# Patient Record
Sex: Female | Born: 1935 | Race: White | Hispanic: No | Smoking: Never smoker
Health system: Southern US, Community
[De-identification: ages and names within clinical notes are randomized; demographics above are authoritative.]

## PROBLEM LIST (undated history)

## (undated) DIAGNOSIS — F32A Depression, unspecified: Secondary | ICD-10-CM

## (undated) DIAGNOSIS — C50919 Malignant neoplasm of unspecified site of unspecified female breast: Secondary | ICD-10-CM

## (undated) DIAGNOSIS — R918 Other nonspecific abnormal finding of lung field: Secondary | ICD-10-CM

## (undated) DIAGNOSIS — M8580 Other specified disorders of bone density and structure, unspecified site: Secondary | ICD-10-CM

## (undated) DIAGNOSIS — Z860100 Personal history of colon polyps, unspecified: Secondary | ICD-10-CM

## (undated) DIAGNOSIS — M1990 Unspecified osteoarthritis, unspecified site: Secondary | ICD-10-CM

## (undated) DIAGNOSIS — K5790 Diverticulosis of intestine, part unspecified, without perforation or abscess without bleeding: Secondary | ICD-10-CM

## (undated) DIAGNOSIS — K219 Gastro-esophageal reflux disease without esophagitis: Secondary | ICD-10-CM

## (undated) DIAGNOSIS — K449 Diaphragmatic hernia without obstruction or gangrene: Secondary | ICD-10-CM

## (undated) DIAGNOSIS — I10 Essential (primary) hypertension: Secondary | ICD-10-CM

## (undated) DIAGNOSIS — J45909 Unspecified asthma, uncomplicated: Secondary | ICD-10-CM

## (undated) DIAGNOSIS — F329 Major depressive disorder, single episode, unspecified: Secondary | ICD-10-CM

## (undated) HISTORY — DX: Gastro-esophageal reflux disease without esophagitis: K21.9

## (undated) HISTORY — DX: Diaphragmatic hernia without obstruction or gangrene: K44.9

## (undated) HISTORY — DX: Personal history of colonic polyps: Z86.010

## (undated) HISTORY — DX: Unspecified asthma, uncomplicated: J45.909

## (undated) HISTORY — PX: ROTATOR CUFF REPAIR: SHX139

## (undated) HISTORY — DX: Other specified disorders of bone density and structure, unspecified site: M85.80

## (undated) HISTORY — DX: Unspecified osteoarthritis, unspecified site: M19.90

## (undated) HISTORY — DX: Depression, unspecified: F32.A

## (undated) HISTORY — DX: Other nonspecific abnormal finding of lung field: R91.8

## (undated) HISTORY — DX: Major depressive disorder, single episode, unspecified: F32.9

## (undated) HISTORY — DX: Diverticulosis of intestine, part unspecified, without perforation or abscess without bleeding: K57.90

## (undated) HISTORY — DX: Essential (primary) hypertension: I10

## (undated) HISTORY — DX: Personal history of colon polyps, unspecified: Z86.0100

## (undated) HISTORY — DX: Malignant neoplasm of unspecified site of unspecified female breast: C50.919

---
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
## 1972-04-25 HISTORY — PX: CHOLECYSTECTOMY: SHX55

## 1983-04-26 HISTORY — PX: SPINE SURGERY: SHX786

## 1998-03-06 ENCOUNTER — Other Ambulatory Visit: Admission: RE | Admit: 1998-03-06 | Discharge: 1998-03-06 | Payer: Self-pay | Admitting: Obstetrics and Gynecology

## 1999-06-14 ENCOUNTER — Other Ambulatory Visit: Admission: RE | Admit: 1999-06-14 | Discharge: 1999-06-14 | Payer: Self-pay | Admitting: Obstetrics and Gynecology

## 1999-11-16 ENCOUNTER — Encounter (INDEPENDENT_AMBULATORY_CARE_PROVIDER_SITE_OTHER): Payer: Self-pay | Admitting: *Deleted

## 1999-11-16 ENCOUNTER — Ambulatory Visit (HOSPITAL_COMMUNITY): Admission: RE | Admit: 1999-11-16 | Discharge: 1999-11-16 | Payer: Self-pay | Admitting: Gastroenterology

## 2000-04-21 ENCOUNTER — Other Ambulatory Visit: Admission: RE | Admit: 2000-04-21 | Discharge: 2000-04-21 | Payer: Self-pay | Admitting: Obstetrics and Gynecology

## 2000-05-08 ENCOUNTER — Encounter: Payer: Self-pay | Admitting: Obstetrics and Gynecology

## 2000-05-08 ENCOUNTER — Encounter: Admission: RE | Admit: 2000-05-08 | Discharge: 2000-05-08 | Payer: Self-pay | Admitting: Obstetrics and Gynecology

## 2001-06-18 ENCOUNTER — Other Ambulatory Visit: Admission: RE | Admit: 2001-06-18 | Discharge: 2001-06-18 | Payer: Self-pay | Admitting: Obstetrics and Gynecology

## 2002-06-25 ENCOUNTER — Other Ambulatory Visit: Admission: RE | Admit: 2002-06-25 | Discharge: 2002-06-25 | Payer: Self-pay | Admitting: Geriatric Medicine

## 2003-01-07 ENCOUNTER — Other Ambulatory Visit: Admission: RE | Admit: 2003-01-07 | Discharge: 2003-01-07 | Payer: Self-pay | Admitting: Gynecology

## 2003-04-14 ENCOUNTER — Observation Stay (HOSPITAL_COMMUNITY): Admission: RE | Admit: 2003-04-14 | Discharge: 2003-04-15 | Payer: Self-pay | Admitting: Orthopedic Surgery

## 2003-04-26 HISTORY — PX: OTHER SURGICAL HISTORY: SHX169

## 2004-07-02 ENCOUNTER — Other Ambulatory Visit: Admission: RE | Admit: 2004-07-02 | Discharge: 2004-07-02 | Payer: Self-pay | Admitting: Geriatric Medicine

## 2007-03-14 ENCOUNTER — Other Ambulatory Visit: Admission: RE | Admit: 2007-03-14 | Discharge: 2007-03-14 | Payer: Self-pay | Admitting: Geriatric Medicine

## 2007-07-10 ENCOUNTER — Encounter: Admission: RE | Admit: 2007-07-10 | Discharge: 2007-07-10 | Payer: Self-pay | Admitting: Orthopedic Surgery

## 2007-07-13 ENCOUNTER — Ambulatory Visit (HOSPITAL_BASED_OUTPATIENT_CLINIC_OR_DEPARTMENT_OTHER): Admission: RE | Admit: 2007-07-13 | Discharge: 2007-07-13 | Payer: Self-pay | Admitting: Orthopedic Surgery

## 2008-04-04 ENCOUNTER — Other Ambulatory Visit: Admission: RE | Admit: 2008-04-04 | Discharge: 2008-04-04 | Payer: Self-pay | Admitting: Geriatric Medicine

## 2008-10-31 ENCOUNTER — Encounter: Admission: RE | Admit: 2008-10-31 | Discharge: 2008-10-31 | Payer: Self-pay | Admitting: Geriatric Medicine

## 2008-11-07 ENCOUNTER — Encounter: Admission: RE | Admit: 2008-11-07 | Discharge: 2008-11-07 | Payer: Self-pay | Admitting: Geriatric Medicine

## 2009-04-14 ENCOUNTER — Encounter: Admission: RE | Admit: 2009-04-14 | Discharge: 2009-04-14 | Payer: Self-pay | Admitting: Geriatric Medicine

## 2009-04-25 HISTORY — PX: OTHER SURGICAL HISTORY: SHX169

## 2010-04-25 HISTORY — PX: JOINT REPLACEMENT: SHX530

## 2010-05-12 ENCOUNTER — Other Ambulatory Visit: Payer: Self-pay | Admitting: Geriatric Medicine

## 2010-05-12 ENCOUNTER — Other Ambulatory Visit
Admission: RE | Admit: 2010-05-12 | Discharge: 2010-05-12 | Payer: Self-pay | Source: Home / Self Care | Admitting: Geriatric Medicine

## 2010-05-12 ENCOUNTER — Encounter
Admission: RE | Admit: 2010-05-12 | Discharge: 2010-05-12 | Payer: Self-pay | Source: Home / Self Care | Attending: Geriatric Medicine | Admitting: Geriatric Medicine

## 2010-05-20 ENCOUNTER — Other Ambulatory Visit: Payer: Self-pay | Admitting: Obstetrics and Gynecology

## 2010-06-29 ENCOUNTER — Other Ambulatory Visit: Payer: Self-pay | Admitting: Orthopedic Surgery

## 2010-06-29 DIAGNOSIS — Z01818 Encounter for other preprocedural examination: Secondary | ICD-10-CM

## 2010-07-01 ENCOUNTER — Ambulatory Visit
Admission: RE | Admit: 2010-07-01 | Discharge: 2010-07-01 | Disposition: A | Discharge status: Home / Self Care | Payer: Medicare Other | Source: Ambulatory Visit | Attending: Orthopedic Surgery | Admitting: Orthopedic Surgery

## 2010-07-01 DIAGNOSIS — Z01818 Encounter for other preprocedural examination: Secondary | ICD-10-CM

## 2010-07-15 ENCOUNTER — Encounter (HOSPITAL_COMMUNITY)
Admission: RE | Admit: 2010-07-15 | Discharge: 2010-07-15 | Disposition: A | Discharge status: Home / Self Care | Payer: Medicare Other | Source: Ambulatory Visit | Attending: Orthopedic Surgery | Admitting: Orthopedic Surgery

## 2010-07-15 DIAGNOSIS — Z01818 Encounter for other preprocedural examination: Secondary | ICD-10-CM | POA: Insufficient documentation

## 2010-07-15 LAB — DIFFERENTIAL
Basophils Absolute: 0 10*3/uL (ref 0.0–0.1)
Basophils Relative: 0 % (ref 0–1)
Eosinophils Relative: 1 % (ref 0–5)
Monocytes Absolute: 0.9 10*3/uL (ref 0.1–1.0)

## 2010-07-15 LAB — CBC
HCT: 40.9 % (ref 36.0–46.0)
MCHC: 34 g/dL (ref 30.0–36.0)
RDW: 14.2 % (ref 11.5–15.5)

## 2010-07-15 LAB — BASIC METABOLIC PANEL
CO2: 27 mEq/L (ref 19–32)
Calcium: 9.2 mg/dL (ref 8.4–10.5)
Chloride: 103 mEq/L (ref 96–112)
Glucose, Bld: 95 mg/dL (ref 70–99)
Potassium: 4.6 mEq/L (ref 3.5–5.1)
Sodium: 138 mEq/L (ref 135–145)

## 2010-07-15 LAB — URINALYSIS, ROUTINE W REFLEX MICROSCOPIC
Hgb urine dipstick: NEGATIVE
Protein, ur: NEGATIVE mg/dL
Urobilinogen, UA: 0.2 mg/dL (ref 0.0–1.0)

## 2010-07-15 LAB — PROTIME-INR: Prothrombin Time: 13.1 seconds (ref 11.6–15.2)

## 2010-07-23 ENCOUNTER — Inpatient Hospital Stay (HOSPITAL_COMMUNITY): Payer: Medicare Other

## 2010-07-23 ENCOUNTER — Inpatient Hospital Stay (HOSPITAL_COMMUNITY)
Admission: RE | Admit: 2010-07-23 | Discharge: 2010-07-25 | DRG: 484 | Disposition: A | Discharge status: Home / Self Care | Payer: Medicare Other | Source: Ambulatory Visit | Attending: Orthopedic Surgery | Admitting: Orthopedic Surgery

## 2010-07-23 DIAGNOSIS — M67919 Unspecified disorder of synovium and tendon, unspecified shoulder: Secondary | ICD-10-CM | POA: Diagnosis present

## 2010-07-23 DIAGNOSIS — K219 Gastro-esophageal reflux disease without esophagitis: Secondary | ICD-10-CM | POA: Diagnosis present

## 2010-07-23 DIAGNOSIS — T84498A Other mechanical complication of other internal orthopedic devices, implants and grafts, initial encounter: Principal | ICD-10-CM | POA: Diagnosis present

## 2010-07-23 DIAGNOSIS — M719 Bursopathy, unspecified: Secondary | ICD-10-CM | POA: Diagnosis present

## 2010-07-23 DIAGNOSIS — Y831 Surgical operation with implant of artificial internal device as the cause of abnormal reaction of the patient, or of later complication, without mention of misadventure at the time of the procedure: Secondary | ICD-10-CM | POA: Diagnosis present

## 2010-07-23 LAB — TYPE AND SCREEN
ABO/RH(D): A NEG
Antibody Screen: NEGATIVE

## 2010-07-23 LAB — ABO/RH: ABO/RH(D): A NEG

## 2010-07-24 LAB — CBC
Platelets: 183 10*3/uL (ref 150–400)
RDW: 14.5 % (ref 11.5–15.5)
WBC: 7.2 10*3/uL (ref 4.0–10.5)

## 2010-07-24 LAB — BASIC METABOLIC PANEL
Calcium: 7.3 mg/dL — ABNORMAL LOW (ref 8.4–10.5)
GFR calc Af Amer: >60 mL/min (ref >60)
GFR calc non Af Amer: >60 mL/min (ref >60)
Potassium: 3.7 mEq/L (ref 3.5–5.1)
Sodium: 135 mEq/L (ref 135–145)

## 2010-07-25 LAB — HEMOGLOBIN AND HEMATOCRIT, BLOOD: Hemoglobin: 8.1 g/dL — ABNORMAL LOW (ref 12.0–15.0)

## 2010-07-28 NOTE — Op Note (Signed)
NAME:  Tammy Lindsey, Tammy Lindsey NO.:  [id number]  MEDICAL RECORD NO.:  [id number]           PATIENT TYPE:  O  LOCATION:  5031                         FACILITY:  [facility]  PHYSICIAN:  Almedia Balls. Ranell Patrick, M.D. DATE OF BIRTH:  1932/04/06  DATE OF PROCEDURE:  07/23/2010 DATE OF DISCHARGE:                              OPERATIVE REPORT   PREOPERATIVE DIAGNOSES:  Left shoulder painful hemiarthroplasty and also rotator cuff deficiency.  POSTOPERATIVE DIAGNOSES:  Left shoulder painful hemiarthroplasty and also rotator cuff deficiency.  PROCEDURE PERFORMED:  Left shoulder revision, total shoulder arthroplasty to reverse total shoulder.  Removal of humeral stem.  ATTENDING SURGEON:  Almedia Balls. Ranell Patrick, MD  ASSISTANT:  Donnie Coffin. Dixon, PA-C  ANESTHESIA:  General anesthesia was used plus interscalene block.  ESTIMATED BLOOD LOSS:  400 mL.  FLUID REPLACEMENT:  1000 mL of crystalloid and 1000 mL Xtend.  INSTRUMENT COUNTS:  Correct. COMPLICATIONS:  No complications.  Perioperative antibiotics were given.  INDICATIONS:  The patient is a 77 year old female who is status post left shoulder hemiarthroplasty for rotator cuff deficiency.  This was done about 8 years ago.  The patient had a CTA head prosthesis placed. She has had decent function, but significant pain following her surgery. Pain has progressed over the years.  She has been managed nonsurgically for as long as she can tolerate.  At this point, she is desiring conversion of her hemiarthroplasty to a reverse total shoulder arthroplasty to create fixed fulcrum mechanics for her shoulder potentially giving a better function, but primarily for pain relief.  We discussed thoroughly all options for treatment.  She wants to proceed with surgery.  Risks and benefits were discussed.  Risks including, but not limited to infection and dislocation of her shoulder arthroplasty. Informed consent was obtained.  DESCRIPTION OF  OPERATION:  After an adequate level of anesthesia was achieved, the patient was positioned in a modified beach-chair position. Left shoulder was sterilely prepped and draped in the usual manner.  All neurovascular structures have been padded appropriately.  Pulse is checked in her feet.  Following sterile prep and drape, we entered the shoulder through a deltopectoral incision.  Dissection down through subcutaneous tissue using a knife and Bovie was used to control hemostasis.  The pectoralis insertion was kept intact on the humerus, deltoid was extremely gently elevated off the underlying soft tissues and humerus and off the humeral implant.  The subscapularis was taken off in a full-thickness fashion off the humerus, the humeral neck, soft tissue release off the humeral neck as well.  We were able to gently deliver the humerus out of the wound.  This took [place] about 30-40 minutes to get this done, again being very careful to preserve all of her deltoid as she has had prior rotator cuff surgery 20 years ago with significant deltoid atrophy after that rotator cuff surgery.  We felt like her anterior head of the deltoid was probably her best muscle as we are going in and that was preserved in its entirety coming off the anterior [facility] joint and off the acromion.  The patient's humeral head was  then removed.  We then identified the proximal humeral stem there and removed all soft tissue from around that granulation tissue.  We then used flexible osteotomes around the proximal portion and tried to disimpact the stem out of the canal and really was not able to do that initially, but then we had to take a flexible osteotome and basically go the entire length of the stem in all 4 sides with the broach handle and heavy duty mouth was able to remove this stem out of the humerus. Fortunately, we only lost a little bone proximally 1.5 cm to 2 cm that just broke away anteriorly and posteriorly.  The  remaining humerus was one nice tube, we checked that with a curette just to make sure I could feel intact humerus in all sides.  At this point, we went ahead and placed a smaller stem in the canal to protect the humerus.  We then retracted the humerus posteriorly.  We did removal of all scar tissue from around the glenoid.  Interestingly the patient had migrated proximally and medially and developed a pseudo glenoid that was in the upper half of the glenoid up under the coracoid process.  We found some native glenoid down below at the inferior pillar and basically cleaned the soft tissue off that.  We were careful to palpate and protect the axillary nerve during this portion of surgery and once we did that we were able to scrape remaining cartilage and soft tissue off the bone and then place our central guide pin for the metaglene reaming in appropriate position.  We then reamed for the metaglene down to subchondral bone and at this point felt like we had enough support, again the upper portion was eroded away in hirsute of glenoid that she had made both proximally and superiorly, but we had at least a 210 to 220 degrees arc of good reamed bone for support for the metaglene.  We then drilled up the central hole for the metaglene.  We then impacted the metaglene in position with that inferior hole lining up with the inferior pillar of the scapula.  We then placed our 48 inferior screw with locking screw 24 anterior locking and a 18 posterior nonlocked.  We went ahead and tightened those down to secure the metaglene into position and then locked it in place via anterior and inferior screw before we placed our superior screw as the superior portions of the metaglene was not supported.  Once we had that screw in position and tightened, we went ahead and locked the locking mechanism as that would serve as basically appear.  We were in good bone with a 30-mm locking screw up under the coracoid  process so that should help [place] preventing any rock superiorly.  With that in place, we then selected a 38 standard glenosphere and then placed that screw into position.  We then directed our attention towards the humeral side.  I had to drill out the inferior pedestal in the humeral shaft.  We had basically centering sleeve that we could slide down the humerus and then drilled with a 4.4 long drill bit out the bottom of the pedestal and then sequentially reamed up to a size 10.  She had 12 global advantage stem, but we felt like we were going to pushing it distally with anything bigger than a 10, so we were able to ream up to a 10 and then we trialed with the long stem 10, size 1 humerus, 0  degrees of retroversion.  With that in place, we reduced the shoulder with a +6 poly and we were very happy with soft tissue balancing.  We had nice tension on the conjoined and on the pec.  The nervous tissue seemed to be under some tension, but not excessive.  We then dislocated that and then thoroughly pulse irrigated the humerus. We were not able to use a cement restrictor as that cement restrictor was basically right at where the inferior part of the stem had to be so we just went ahead and selected high viscosity DePuy cement with a small nozzle for the cement gun and then mixed cement vacuum mixing on the back table and then at about 4 minutes injected that cement after we dried the canal thoroughly, injected that into the canal, and once we had that filled we went ahead and placed our stem, a long stem cemented size 10 reverse stem with 0 degrees eversion.  With that in position, we allowed the cement to harden, we then trialed again with +6.  We were happy with that and then selected real +6 poly, 38 +6, reduced that and again we were happy with stability through a full arc of motion, negative sulcus, no gapping with external rotation and no bony or soft tissue impingement either.  We checked  the nerve one more time to make sure it was in good shape. We then thoroughly irrigated with a liter of irrigation and closed the deltopectoral interval with 0-Vicryl suture followed by 2-0 Vicryl subcutaneous closure, 4-0 for Monocryl for skin.  Steri-Strips were applied followed by sterile dressing.  The patient tolerated the surgery well.     Almedia Balls. Ranell Patrick, M.D.     SRN/MEDQ  D:  07/23/2010  T:  07/24/2010  Job:  [id number]  Electronically Signed by Malon Kindle  on 07/28/2010 08:08:31 PM

## 2010-08-18 NOTE — H&P (Signed)
  NAME:  Tammy Lindsey, Tammy Lindsey NO.:  [id number]  MEDICAL RECORD NO.:  [id number]          PATIENT TYPE:  LOCATION:                                 FACILITY:  PHYSICIAN:  Almedia Balls. Ranell Patrick, M.D. DATE OF BIRTH:  11-14-1931  DATE OF ADMISSION: DATE OF DISCHARGE:                             HISTORY & PHYSICAL   CHIEF COMPLAINT:  Left shoulder pain.  HISTORY OF PRESENT ILLNESS:  The patient is a 77 year old female with worsening left shoulder pain status post a CTA head, arthroplasty for osteoarthritis.  The patient elected to have surgery to decrease pain and increase function of the left shoulder.  PAST MEDICAL HISTORY:  Negative.  FAMILY MEDICAL HISTORY:  Negative.  SOCIAL HISTORY:  The patient of Dr. Merlene Laughter, does not smoke or use alcohol.  DRUG ALLERGIES:  SULFA.  CURRENT MEDICATIONS:  Zegerid, Lexapro, Provera, Advil, aspirin, and calcium.  REVIEW OF SYSTEMS:  Pain on range of motion of the left shoulder.  PHYSICAL EXAMINATION:  VITAL SIGNS:  Pulse 74, respirations 16, blood pressure 102/68. GENERAL:  The patient is a healthy-appearing 77 year old female, in no acute distress.  Pleasant mood and affect, alert and oriented  x3. HEAD AND NECK:  Full range of motion without any difficulty.  She has no tenderness to palpation of the cervical and thoracic spine. CHEST:  Active breath sounds bilaterally.  No wheezes, rhonchi, or rales. HEART:  Regular rate and rhythm.  No murmur. ABDOMEN:  Nontender, nondistended with active bowel sounds. EXTREMITIES:  Moderate tenderness of the left shoulder with adequate strength is 4/5, but she has full flexion actively to about 70 degrees external rotation, neutral internal rotation.  Neurovascularly otherwise she is intact.  X-rays show a left shoulder previous hemiarthroplasty with CTA head.  IMPRESSION:  Left shoulder hemiarthroplasty with CTA head.  PLAN OF ACTION:  A reverse total shoulder arthroplasty  revision by Dr. Malon Kindle.     Thomas B. Dixon, P.A.   ______________________________ Almedia Balls. Ranell Patrick, M.D.    TBD/MEDQ  D:  07/21/2010  T:  07/22/2010  Job:  [id number]  Electronically Signed by Standley Dakins P.A. on 07/29/2010 09:13:51 AM Electronically Signed by Malon Kindle  on 08/18/2010 05:15:39 PM

## 2010-08-18 NOTE — Discharge Summary (Signed)
  NAME:  Tammy Lindsey, Tammy Lindsey NO.:  [id number]  MEDICAL RECORD NO.:  [id number]           PATIENT TYPE:  O  LOCATION:  5031                         FACILITY:  [facility]  PHYSICIAN:  Almedia Balls. Ranell Patrick, M.D. DATE OF BIRTH:  13-Jan-1932  DATE OF ADMISSION:  07/23/2010 DATE OF DISCHARGE:  07/25/2010                              DISCHARGE SUMMARY   ADMISSION DIAGNOSIS:  Painful left shoulder with previous hemiarthroplasty.  DISCHARGE DIAGNOSIS:  Painful left shoulder with previous hemiarthroplasty status post reverse total shoulder arthroplasty.  BRIEF HISTORY:  The patient is a 77 year old female with worsening left shoulder pain and function status post hemiarthroplasty with a CTA head. The patient has elected for surgical management to decrease pain and increase function of that left shoulder.  PROCEDURE:  The patient had a left total shoulder arthroplasty revision using a reverse total shoulder arthroplasty on July 23, 2010. Assistant was [employer], PA-C.  General anesthesia was used.  No complications.  HOSPITAL COURSE:  The patient admitted on July 23, 2010, for the above- stated procedure which she tolerated well.  After adequate time in postanesthesia care unit, she was transferred up to 5000.  Postop day 1, the patient complained about only minimal pain, was able to work with physical therapy and occupational therapy quite well and thus the patient will be discharged home with home health exercises.  Overall, she is doing quite well, and she was afebrile and her labs within acceptable limits status post surgery.  DISCHARGE PLAN:  The patient was discharged home on July 24, 2010.  Her condition is stable.  Her diet is regular.  DISCHARGE MEDICATIONS:  Robaxin 500 mg p.o. q.6 h. and Percocet 5/325 one to two tablets q.4-6 h. p.r.n. for pain.     Thomas B. Dixon, P.A.   ______________________________ Almedia Balls. Ranell Patrick, M.D.    TBD/MEDQ  D:   07/27/2010  T:  07/28/2010  Job:  [id number]  Electronically Signed by Standley Dakins P.A. on 07/29/2010 09:13:47 AM Electronically Signed by Malon Kindle  on 08/18/2010 05:15:36 PM

## 2010-08-24 HISTORY — PX: JOINT REPLACEMENT: SHX530

## 2010-09-07 NOTE — Op Note (Signed)
NAME:  LOVEDA, Tammy Lindsey NO.:  [id number]   MEDICAL RECORD NO.:  [id number]          PATIENT TYPE:  AMB   LOCATION:  DSC                          FACILITY:  [facility]   PHYSICIAN:  Dionne Ano. 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   POSTOPERATIVE DIAGNOSIS:  Right thumb carpometacarpal joint degenerative  joint disease, end-stage, with failure of conservative management.   PROCEDURE:  1. Right thumb arthroplasty (removal of the trapezium at the basilar      joint/wrist level.  This was a complete trapezium removal).  2. Right abductor pollicis longus digastric portion tendon transfer to      the first metacarpal and FCR and back upon the tendon itself      (Zancolli tendon transfer) right basilar thumb joint.  3. Right abductor pollicis longus one-third proper tendon transfer to      the FCR and APL proper (Weilby tendon transfer) right basilar thumb      joint.  4. Right abductor pollicis longus tenodesis (shortening of wrist      extensor at the wrist forearm level to prevent dorsolateral      escape).   SURGEON:  Dionne Ano. Amanda Pea, M.D.   ASSISTANT:  Karie Chimera, P.A.-C.   COMPLICATIONS:  None.   ANESTHESIA:  General.   TOURNIQUET TIME:  Less than an hour.   INDICATIONS FOR PROCEDURE:  This patient is a 77 year old female who  presents with the above-mentioned diagnosis.  I have counseled her in  regards to the risks and benefits of surgery including risk of  infection, bleeding anesthesia, damage to normal structures, and failure  of surgery to accomplish its intended goals of relieving symptoms and  restoring function.  With this in mind, she desires to proceed.  All  questions have been encouraged and  answered preoperatively.   OPERATIVE PROCEDURE:  The patient was seen by myself and anesthesia.  Permit was signed.  She was counseled extensively with her husband  present.  Preoperative antibiotics were given.  Time-out was called, arm  was marked, and she was taken to the operative suite and sedated.  Previously, a regional block anesthetic had been introduced and provided  excellent anesthesia.  Once in the operative suite, a sterile field was  secured.  The arm was then elevated.  Tourniquet was insufflated to 250  mmHg, and a curvilinear incision was made dorsally.  Following this,  dissection was carried down, and the EPB and APL interval was entered.  Superficial radial nerve branch was identified and protected at all  times.  Radial artery was identified and protected at all times.  I then  incised the capsule, performed trapezium excision in a piecemeal-type  fashion, and then performed FCR tenolysis tenosynovectomy.  Following  this, I irrigated and created a drill hole dorsal to palmar exiting  intra-articularly in line with the palmar beak ligament.  This was  enlarged to a 2/8 drill bit size approximately.   I should note that this completed the trapezium removal and arthroplasty  portion of the procedure.  The patient tolerated this well.  Following  this, I then turned attention towards a counterincision made dorsally.  A one-third proper portion of the APL tendon was harvested, and the APL  digastric portion was clipped proximally and retracted distally.  Once  this done, the APL digastric portion was taken around the APL base and  placed through a drill hole dorsally to palmar, exiting around the FCR  tendon twice and then back up through itself, the capsule, and the APL  proper tendon.  It was inset with 3-0 FiberWire and completed the  Zancolli tendon transfer.   Following this, I then took the free one-third proper APL tendon graft  and weaved it through the FCR  and APL proper in multiple figure-of-eight  throws creating a Weilby tendon transfer.  This was inset with 3-0  FiberWire as well.  This I made for a nice a good fit and joint.  Following this, irrigation was applied, and Gelfoam was placed in the  void.  The patient had a good splay.   Following this, I then performed a tightening and shortening of the APL  tendon.  This was shortening to prevent dorsolateral escape of the first  metacarpal.  This was done with 3-0 FiberWire and with APL tenodesis.  Next, a complex capsular closure was performed.  This was done with  FiberWire as well.  At this time, I then identified the area.  I noted  it looked quite well.  There were no complicating features, and the  thumb sat nicely in a nicely suspended and stabilized position.  We  deflated the tourniquet, obtained hemostasis with bipolar cautery as we  did during the initial dissection, and closed the wound with Prolene.  Following this, the patient was placed in a very careful thumb spica  splint preventing MCP hyperextension or excessive flexion of the thumb  CMC region.   The patient tolerated this well.  She will be monitored overnight and  discharged in the morning.  We will plan for IV antibiotics, pain  management according to her needs and protocol, and see her back in the  office in 10 days for our standard postoperative Zancolli protocol with  removal of her splint.  X-rays, casting for four and a half weeks, at  the six-week postoperative mark move her to the therapeutic area after  removal of splinting and the remaining therapeutic endeavors.   It has been a pleasure to see today and treat her.  All questions have  been encouraged and answered.           ______________________________  Dionne Ano. Everlene Other, M.D.     Nash Mantis  D:  07/13/2007  T:  07/13/2007  Job:  [id number]

## 2010-09-10 NOTE — Op Note (Signed)
NAME:  Tammy Lindsey, Tammy Lindsey                           ACCOUNT NO.:  [id number]   MEDICAL RECORD NO.:  [id number]                   PATIENT TYPE:  OBV   LOCATION:  0446                                 FACILITY:  [facility]   PHYSICIAN:  Almedia Balls. Ranell Patrick, M.D.              DATE OF BIRTH:  02-Oct-1931   DATE OF PROCEDURE:  04/14/2003  DATE OF DISCHARGE:  04/15/2003                                 OPERATIVE REPORT   PREOPERATIVE DIAGNOSIS:  Left shoulder rotator cuff tear arthropathy.   POSTOPERATIVE DIAGNOSIS:  Left shoulder rotator cuff tear arthropathy.   PROCEDURE PERFORMED:  Left shoulder hemiarthroplasty using [employer] CTA  prosthesis.   SURGEON:  Almedia Balls. Ranell Patrick, M.D.   FIRST ASSISTANT:  Clarene Reamer, P.A.-C.   SECOND ASSISTANT:  Nance Pew, P.A.-S.   General anesthesia plus interscalene block anesthesia was used.   ESTIMATED BLOOD LOSS:  Minimal.   FLUIDS REPLACED:  1800 mL crystalloid.   Instrument count was correct.  There were no complications.  Perioperative  antibiotics were given.   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  The patient has had  worsening shoulder pain.  She has had multiple operations for rotator cuff  repairs resulting in a loss of rotator cuff function and progressive  arthrosis in the shoulder.  The patient has failed conservative management  consisting of multiple intra-articular steroid injections, activity  modification, and pain medication.  Now presents with debilitating pain and  poor shoulder function, for left shoulder surgery.   DESCRIPTION OF PROCEDURE:  After an adequate level of anesthesia was  achieved, the patient was positioned in the modified beach chair position.  All neurovascular structures were padded appropriately.  Preoperative range  of motion was noted to be forward flexion 60 degrees, external rotation less  than 20 degrees, internal  rotation at about 40 degrees, abduction limited to  about 30 degrees with a solid end point.  After completion of exam under  anesthesia, the left shoulder and arm were prepped and draped in their  entirety in the usual sterile fashion.  A deltopectoral incision was created  utilizing the coracoid process as a landmark and extending down toward the  leading edge of the deltoid on the humeral side.  Dissection was carried  sharply down through subcutaneous tissues using Bovie electrocautery.  The  cephalic vein was identified and taken laterally with the deltoid, the  pectoralis was taken medially, and the upper centimeter was released  sharply, protecting deep structures.  This revealed the clavipectoral  fascia, which was sharply incised.  Next the conjoined tendon was taken  medially, revealing the three sisters.  These were ligated under direct  visualization.  Next the subscapularis was taken off the humerus with  attention toward taking this off subperiosteally to preserve as much  subscapular length as possible.  At this  point the shoulder was exposed.  There was noted to be end-stage arthritis on the end of the humerus with  osteophyte formation.  Next the humeral cutting guide was applied to the  humerus.  A curved retractor was placed around the humeral head to protect  posterior soft tissues, and this also provided a cutting guide for the saw.  An oscillating saw was used to remove the arthritic humeral head with  appropriate anteversion and an appropriate neck angle.  At this point the  humeral canal was prepared utilizing sequential broaches, and we were able  to broach up to a size 12.  We prepared initially with hand-held reamers and  then went to broaches.  Following this we were careful in selecting the  anteversion, set at around 35-40 degrees with the anterior fin along the  bicipital groove.  This provided appropriate head coverage.  We then  selected a 44 size head x 18  mm thick.  This trialled with nice soft tissue  tension.  Again there was noted to be no remaining rotator cuff; thus, the  CTHA was placed on and used to remove the greater tuberosity.  A rongeur was  used to remove excess bone to allow the CTA head process to be fully seated.  Once this was on the shoulder was taken again through a full range of  motion.  Excellent coverage and balance was noted.  At this point trial  components were removed.  The real implants were impacted with nice press  fit into place, care taken toward keeping the stem out of varus and also  toward appropriate version.  Again the shoulder was trialled.  There was  noted to be excellent coverage with the 44 x 18 prosthesis.  Again, a CTA  prosthesis was used.  The glenoid was assessed prior to applying the final  prosthesis and was felt to be adequately concave in its appearance with no  need for preparation using the glenoid reamers.  At this point after  reducing the implant, subscapularis was reapplied to the humerus utilizing  drill holes and Mason-Allen sutures into the subscapularis with 1 mm cottony  Dacron suture tapes that were brought out through the bicipital groove.  The  biceps was tenodesed into the groove with a soft tissue-type tenodesis,  sewing it directly to bone and surrounding soft tissues.  This provided a  solid subscapularis repair.  We able to externally rotate to 40 degrees with  the subscapularis repaired without any undue tension.  At this point the  deltopectoral wound was closed after thorough irrigation.  A drain was  placed prior to closure, and the skin was closed using running 4-0 Monocryl.  A sterile dressing was applied, followed by shoulder splinting.  The patient  tolerated surgery well.                                               Almedia Balls. Ranell Patrick, M.D.    SRN/MEDQ  D:  04/27/2003  T:  04/28/2003  Job:  [id number]

## 2010-09-10 NOTE — Procedures (Signed)
[facility]  Patient:    Tammy Lindsey, Tammy Lindsey                           MRN: [id number] Proc. Date: 11/16/99 Adm. Date:  [id number] Disc. Date: [id number] Attending:  Rich Brave CC:         Georg Ruddle. Viviann Spare, M.D.                           Procedure Report  PROCEDURE:  Colonoscopy with biopsy.  INDICATIONS:  A 77 year old for colon cancer screening.  FINDINGS:  Diminutive distal rectal polyp.  Scattered diverticulosis.  DESCRIPTION OF PROCEDURE:  The risks of the procedure had been discussed with the patient, as well as the rationale behind doing it, and she provided written consent.  Sedation with Fentanyl 50 mcg and Versed 6 mg intravenously without arrhythmias or desaturations.  The Olympus pediatric adjustable tension video colonoscope was easily advanced to the cecum, as identified by clear visualization of the appendiceal orifice, after which pullback was performed.  The quality of the prep was very good, and it was felt that all areas were well seen.  There was a diminutive 2 to 3 mm sessile polyp removed by cold biopsy technique in the distal rectum.  This was performed after an otherwise normal retroflexion. There was scattered diverticulosis, especially in the left colon, but also a little bit proximally as well.  There was no evidence of large polyps, cancer, colitis, or vascular malformations. The patient tolerated the procedure well, and there were no apparent complications.  IMPRESSION: 1. Distal rectal polyp, pathology pending. 2. Diverticulosis.  PLAN: 1. Await pathology and biopsies. 2. The patients bowel habits have improved, such that her stools are not as    loose as they used to be, although there is still a slightly loose    character to them.  If this should persist, we could give consideration to    a trial of Asacol. DD:  11/16/99 TD:  11/17/99 Job: [id number]

## 2010-12-29 ENCOUNTER — Other Ambulatory Visit: Payer: Self-pay | Admitting: Ophthalmology

## 2011-01-17 LAB — POCT HEMOGLOBIN-HEMACUE: Hemoglobin: 12.8

## 2011-05-16 DIAGNOSIS — E7800 Pure hypercholesterolemia, unspecified: Secondary | ICD-10-CM | POA: Diagnosis not present

## 2011-05-16 DIAGNOSIS — Z79899 Other long term (current) drug therapy: Secondary | ICD-10-CM | POA: Diagnosis not present

## 2011-05-16 DIAGNOSIS — M949 Disorder of cartilage, unspecified: Secondary | ICD-10-CM | POA: Diagnosis not present

## 2011-05-16 DIAGNOSIS — Z0000 Encounter for general adult medical examination without abnormal findings: Secondary | ICD-10-CM | POA: Diagnosis not present

## 2011-05-16 DIAGNOSIS — M899 Disorder of bone, unspecified: Secondary | ICD-10-CM | POA: Diagnosis not present

## 2011-05-17 DIAGNOSIS — E7800 Pure hypercholesterolemia, unspecified: Secondary | ICD-10-CM | POA: Diagnosis not present

## 2011-05-17 DIAGNOSIS — Z79899 Other long term (current) drug therapy: Secondary | ICD-10-CM | POA: Diagnosis not present

## 2011-05-26 DIAGNOSIS — M899 Disorder of bone, unspecified: Secondary | ICD-10-CM | POA: Diagnosis not present

## 2011-05-26 DIAGNOSIS — M949 Disorder of cartilage, unspecified: Secondary | ICD-10-CM | POA: Diagnosis not present

## 2011-06-28 DIAGNOSIS — M949 Disorder of cartilage, unspecified: Secondary | ICD-10-CM | POA: Diagnosis not present

## 2011-06-28 DIAGNOSIS — F329 Major depressive disorder, single episode, unspecified: Secondary | ICD-10-CM | POA: Diagnosis not present

## 2011-06-28 DIAGNOSIS — M899 Disorder of bone, unspecified: Secondary | ICD-10-CM | POA: Diagnosis not present

## 2011-06-28 DIAGNOSIS — F3289 Other specified depressive episodes: Secondary | ICD-10-CM | POA: Diagnosis not present

## 2011-07-21 DIAGNOSIS — M48061 Spinal stenosis, lumbar region without neurogenic claudication: Secondary | ICD-10-CM | POA: Diagnosis not present

## 2011-07-29 DIAGNOSIS — M549 Dorsalgia, unspecified: Secondary | ICD-10-CM | POA: Diagnosis not present

## 2011-07-29 DIAGNOSIS — R279 Unspecified lack of coordination: Secondary | ICD-10-CM | POA: Diagnosis not present

## 2011-07-29 DIAGNOSIS — M4800 Spinal stenosis, site unspecified: Secondary | ICD-10-CM | POA: Diagnosis not present

## 2011-08-01 DIAGNOSIS — M4800 Spinal stenosis, site unspecified: Secondary | ICD-10-CM | POA: Diagnosis not present

## 2011-08-01 DIAGNOSIS — M549 Dorsalgia, unspecified: Secondary | ICD-10-CM | POA: Diagnosis not present

## 2011-08-01 DIAGNOSIS — R279 Unspecified lack of coordination: Secondary | ICD-10-CM | POA: Diagnosis not present

## 2011-08-02 DIAGNOSIS — M48061 Spinal stenosis, lumbar region without neurogenic claudication: Secondary | ICD-10-CM | POA: Diagnosis not present

## 2011-08-04 DIAGNOSIS — M549 Dorsalgia, unspecified: Secondary | ICD-10-CM | POA: Diagnosis not present

## 2011-08-04 DIAGNOSIS — R279 Unspecified lack of coordination: Secondary | ICD-10-CM | POA: Diagnosis not present

## 2011-08-04 DIAGNOSIS — M4800 Spinal stenosis, site unspecified: Secondary | ICD-10-CM | POA: Diagnosis not present

## 2011-08-09 DIAGNOSIS — R279 Unspecified lack of coordination: Secondary | ICD-10-CM | POA: Diagnosis not present

## 2011-08-09 DIAGNOSIS — M549 Dorsalgia, unspecified: Secondary | ICD-10-CM | POA: Diagnosis not present

## 2011-08-09 DIAGNOSIS — M4800 Spinal stenosis, site unspecified: Secondary | ICD-10-CM | POA: Diagnosis not present

## 2011-08-12 DIAGNOSIS — R279 Unspecified lack of coordination: Secondary | ICD-10-CM | POA: Diagnosis not present

## 2011-08-12 DIAGNOSIS — M4800 Spinal stenosis, site unspecified: Secondary | ICD-10-CM | POA: Diagnosis not present

## 2011-08-12 DIAGNOSIS — M549 Dorsalgia, unspecified: Secondary | ICD-10-CM | POA: Diagnosis not present

## 2011-08-15 DIAGNOSIS — M549 Dorsalgia, unspecified: Secondary | ICD-10-CM | POA: Diagnosis not present

## 2011-08-15 DIAGNOSIS — M4800 Spinal stenosis, site unspecified: Secondary | ICD-10-CM | POA: Diagnosis not present

## 2011-08-15 DIAGNOSIS — R279 Unspecified lack of coordination: Secondary | ICD-10-CM | POA: Diagnosis not present

## 2011-08-18 DIAGNOSIS — R279 Unspecified lack of coordination: Secondary | ICD-10-CM | POA: Diagnosis not present

## 2011-08-18 DIAGNOSIS — M549 Dorsalgia, unspecified: Secondary | ICD-10-CM | POA: Diagnosis not present

## 2011-08-18 DIAGNOSIS — M4800 Spinal stenosis, site unspecified: Secondary | ICD-10-CM | POA: Diagnosis not present

## 2011-08-18 DIAGNOSIS — M48061 Spinal stenosis, lumbar region without neurogenic claudication: Secondary | ICD-10-CM | POA: Diagnosis not present

## 2011-08-30 DIAGNOSIS — M549 Dorsalgia, unspecified: Secondary | ICD-10-CM | POA: Diagnosis not present

## 2011-08-30 DIAGNOSIS — F329 Major depressive disorder, single episode, unspecified: Secondary | ICD-10-CM | POA: Diagnosis not present

## 2011-08-30 DIAGNOSIS — F3289 Other specified depressive episodes: Secondary | ICD-10-CM | POA: Diagnosis not present

## 2011-08-30 DIAGNOSIS — M79609 Pain in unspecified limb: Secondary | ICD-10-CM | POA: Diagnosis not present

## 2011-09-12 DIAGNOSIS — Z1231 Encounter for screening mammogram for malignant neoplasm of breast: Secondary | ICD-10-CM | POA: Diagnosis not present

## 2011-11-04 DIAGNOSIS — M5137 Other intervertebral disc degeneration, lumbosacral region: Secondary | ICD-10-CM | POA: Diagnosis not present

## 2011-11-28 DIAGNOSIS — R059 Cough, unspecified: Secondary | ICD-10-CM | POA: Diagnosis not present

## 2011-11-28 DIAGNOSIS — R05 Cough: Secondary | ICD-10-CM | POA: Diagnosis not present

## 2011-11-28 DIAGNOSIS — N644 Mastodynia: Secondary | ICD-10-CM | POA: Diagnosis not present

## 2011-12-13 DIAGNOSIS — L259 Unspecified contact dermatitis, unspecified cause: Secondary | ICD-10-CM | POA: Diagnosis not present

## 2011-12-13 DIAGNOSIS — D239 Other benign neoplasm of skin, unspecified: Secondary | ICD-10-CM | POA: Diagnosis not present

## 2011-12-30 DIAGNOSIS — H52209 Unspecified astigmatism, unspecified eye: Secondary | ICD-10-CM | POA: Diagnosis not present

## 2011-12-30 DIAGNOSIS — H2510 Age-related nuclear cataract, unspecified eye: Secondary | ICD-10-CM | POA: Diagnosis not present

## 2012-01-12 ENCOUNTER — Encounter: Payer: Self-pay | Admitting: Internal Medicine

## 2012-01-12 ENCOUNTER — Ambulatory Visit (INDEPENDENT_AMBULATORY_CARE_PROVIDER_SITE_OTHER)
Admission: RE | Admit: 2012-01-12 | Discharge: 2012-01-12 | Disposition: A | Discharge status: Home / Self Care | Payer: Medicare Other | Source: Ambulatory Visit | Attending: Internal Medicine | Admitting: Internal Medicine

## 2012-01-12 ENCOUNTER — Ambulatory Visit (INDEPENDENT_AMBULATORY_CARE_PROVIDER_SITE_OTHER): Payer: Medicare Other | Admitting: Internal Medicine

## 2012-01-12 VITALS — BP 118/68 | HR 74 | Ht 63.0 in | Wt 135.4 lb

## 2012-01-12 DIAGNOSIS — R059 Cough, unspecified: Secondary | ICD-10-CM | POA: Diagnosis not present

## 2012-01-12 DIAGNOSIS — R053 Chronic cough: Secondary | ICD-10-CM

## 2012-01-12 DIAGNOSIS — R918 Other nonspecific abnormal finding of lung field: Secondary | ICD-10-CM | POA: Diagnosis not present

## 2012-01-12 DIAGNOSIS — R05 Cough: Secondary | ICD-10-CM

## 2012-01-12 MED ORDER — BENZONATATE 200 MG PO CAPS: 200.0000 mg | ORAL_CAPSULE | Freq: Three times a day (TID) | ORAL | Status: AC | PRN

## 2012-01-12 MED ORDER — ACLIDINIUM BROMIDE 400 MCG/ACT IN AEPB: 1.0000 | INHALATION_SPRAY | Freq: Every day | RESPIRATORY_TRACT | Status: DC

## 2012-01-12 NOTE — Patient Instructions (Addendum)
Order- CXR- dx cough, hx of lung nodules  Sample Tudorza inhaler  1 puff, twice daily.   Try to use this up, and decide over that time whether it helped your cough significantly  Script for benzonatate perles    May take one cap three times daily as needed for cough.

## 2012-01-12 NOTE — Progress Notes (Signed)
01/12/12- 43 yoF never smoker referred courtesy of Dr. Pete Glatter because of persistent cough x one year, worse in the last 3 months. She associates her cough rather loosely, with drinking or working in her yard. It is relieved spontaneously. She is self-conscious about coughing in public. Never smoked and denies any history of tuberculosis or time spent in histoplasmosis or other fungal regions of the country to her knowledge. Dry cough without wheeze. No history of asthma or pneumonia. She does have some history of GERD and occasionally strangles when swallowing. Has had flu vaccine and pneumonia vaccine. She is widowed, living alone with her cat. CT scan from 2012 reviewed with her, showing benign stable nodules. CT chest 05/12/10 IMPRESSION:  Scattered pulmonary nodules measure 4 mm or less in size and are  stable from 11/07/2008. These are therefore considered benign.  Provider: Arvella Nigh   Prior to Admission medications   Medication Sig Start Date End Date Taking? Authorizing Provider  ALPRAZolam (XANAX) 0.25 MG tablet Take 0.25 mg by mouth at bedtime as needed.   Yes Historical Provider, MD  aspirin 81 MG tablet Take 81 mg by mouth daily.   Yes Historical Provider, MD  Calcium Carbonate-Vitamin D 600-400 MG-UNIT per tablet Take 1 tablet by mouth daily.   Yes Historical Provider, MD  citalopram (CELEXA) 40 MG tablet Take 1 tablet by mouth daily. 12/26/11  Yes Historical Provider, MD  eszopiclone (LUNESTA) 2 MG TABS Take 2 mg by mouth at bedtime. Take immediately before bedtime   Yes Historical Provider, MD  LIDODERM 5 % 1 patch. To intact skin remove after 12 hours once a day 01/10/12  Yes Historical Provider, MD  Multiple Vitamin (MULTIVITAMIN) tablet Take 1 tablet by mouth daily.   Yes Historical Provider, MD  omeprazole-sodium bicarbonate (ZEGERID) 40-1100 MG per capsule Take 1 tablet by mouth daily. 01/10/12  Yes Historical Provider, MD  triamcinolone (KENALOG) 0.1 % paste Place 1  application onto teeth at bedtime.   Yes Historical Provider, MD  Aclidinium Bromide (TUDORZA PRESSAIR) 400 MCG/ACT AEPB Inhale 1 puff into the lungs daily. 01/12/12   Waymon Budge, MD  benzonatate (TESSALON) 200 MG capsule Take 1 capsule (200 mg total) by mouth 3 (three) times daily as needed for cough. 01/12/12 01/11/13  Waymon Budge, MD   Past Medical History  Diagnosis Date  . Osteoarthritis   . Esophageal reflux   . Diverticulosis   . Hiatal hernia   . Hx of colonic polyps   . Pulmonary nodules     scattered-CT Scan July 2010, 12-10, and 04-2010-all stable felt benign   . Osteopenia     bone density 1-09 and 2-13  . Hypercholesteremia   . Depression    Past Surgical History  Procedure Date  . Tonsillectomy 1952  . Rotator cuff repair     x 2 1998 and 1999   Family History  Problem Relation Age of Onset  . Asthma Father    History   Social History  . Marital Status: Widowed    Spouse Name: N/A    Number of Children: N/A  . Years of Education: N/A   Occupational History  . Not on file.   Social History Main Topics  . Smoking status: Never Smoker   . Smokeless tobacco: Not on file  . Alcohol Use: 0.6 oz/week    1 Glasses of wine per week     will at times have margarita  . Drug Use: No  . Sexually Active:  Not on file   Other Topics Concern  . Not on file   Social History Narrative   Lives at [employer]   ROS-see HPI Constitutional:   No-   weight loss, night sweats, fevers, chills, fatigue, lassitude. HEENT:   No-  headaches, difficulty swallowing, tooth/dental problems, sore throat,       No-  sneezing, itching, ear ache, nasal congestion, + nasal drip,  CV:  No-   chest pain, orthopnea, PND, swelling in lower extremities, anasarca, dizziness, palpitations Resp: No-   shortness of breath with exertion or at rest.              No-   productive cough,  + non-productive cough,  No- coughing up of blood.              No-   change in color of  mucus.  No- wheezing.   Skin: No-   rash or lesions. GI:  No-   heartburn, indigestion, abdominal pain, nausea, vomiting, diarrhea,                 change in bowel habits, loss of appetite GU: No-   dysuria, change in color of urine, no urgency or frequency.  No- flank pain. MS:  No-   joint pain or swelling.  No- decreased range of motion.  No- back pain. Neuro-     nothing unusual Psych:  No- change in mood or affect. No depression or anxiety.  No memory loss.  OBJ- Physical Exam General- Alert, Oriented, Affect-appropriate, Distress- none acute, trim Skin- rash-none, lesions- none, excoriation- none Lymphadenopathy- none Head- atraumatic            Eyes- Gross vision intact, PERRLA, conjunctivae and secretions clear            Ears- Hearing, canals-normal            Nose- Clear, no-Septal dev, mucus, polyps, erosion, perforation             Throat- Mallampati III , mucosa clear , drainage- none, tonsils- atrophic Neck- flexible , trachea midline, no stridor , thyroid nl, carotid no bruit Chest - symmetrical excursion , unlabored           Heart/CV- RRR , no murmur , no gallop  , no rub, nl s1 s2                           - JVD- none , edema- none, stasis changes- none, varices- none           Lung- clear to P&A, wheeze- none, cough- none , dullness-none, rub- none           Chest wall-  Abd- tender-no, distended-no, bowel sounds-present, HSM- no Br/ Gen/ Rectal- Not done, not indicated Extrem- cyanosis- none, clubbing, none, atrophy- none, strength- nl Neuro- grossly intact to observation

## 2012-01-20 NOTE — Progress Notes (Signed)
Quick Note:  LMTCB ______ 

## 2012-01-22 DIAGNOSIS — R05 Cough: Secondary | ICD-10-CM | POA: Insufficient documentation

## 2012-01-22 DIAGNOSIS — R053 Chronic cough: Secondary | ICD-10-CM | POA: Insufficient documentation

## 2012-01-22 NOTE — Assessment & Plan Note (Signed)
Nonspecific cough with associated with some degree of rhinorrhea and with report of dysphagia and GERD. Cyclical cough involving reflux would be the most common explanation. We will begin by seeing how she responds to simple cough suppression and use of Tudorza. Reflux counseling given

## 2012-01-27 NOTE — Progress Notes (Signed)
Quick Note:  LMTCB ______ 

## 2012-01-30 ENCOUNTER — Telehealth: Payer: Self-pay | Admitting: Internal Medicine

## 2012-01-30 ENCOUNTER — Other Ambulatory Visit: Payer: Self-pay | Admitting: Internal Medicine

## 2012-01-30 NOTE — Telephone Encounter (Signed)
Pt given cxr results per Dr Young. 

## 2012-01-30 NOTE — Telephone Encounter (Signed)
Pt requests to be reached at [phone].  Tammy Lindsey

## 2012-02-14 DIAGNOSIS — H6120 Impacted cerumen, unspecified ear: Secondary | ICD-10-CM | POA: Diagnosis not present

## 2012-02-14 DIAGNOSIS — H905 Unspecified sensorineural hearing loss: Secondary | ICD-10-CM | POA: Diagnosis not present

## 2012-02-23 ENCOUNTER — Ambulatory Visit: Payer: Medicare Other | Admitting: Internal Medicine

## 2012-03-06 DIAGNOSIS — M19019 Primary osteoarthritis, unspecified shoulder: Secondary | ICD-10-CM | POA: Diagnosis not present

## 2012-03-21 DIAGNOSIS — M48061 Spinal stenosis, lumbar region without neurogenic claudication: Secondary | ICD-10-CM | POA: Diagnosis not present

## 2012-04-13 ENCOUNTER — Encounter: Payer: Self-pay | Admitting: Internal Medicine

## 2012-04-13 ENCOUNTER — Ambulatory Visit (INDEPENDENT_AMBULATORY_CARE_PROVIDER_SITE_OTHER): Payer: Medicare Other | Admitting: Internal Medicine

## 2012-04-13 VITALS — BP 120/76 | HR 96 | Ht 64.0 in | Wt 138.4 lb

## 2012-04-13 DIAGNOSIS — R05 Cough: Secondary | ICD-10-CM

## 2012-04-13 DIAGNOSIS — R059 Cough, unspecified: Secondary | ICD-10-CM

## 2012-04-13 DIAGNOSIS — R053 Chronic cough: Secondary | ICD-10-CM

## 2012-04-13 NOTE — Progress Notes (Signed)
01/12/12- 85 yoF never smoker referred courtesy of Dr. Pete Glatter because of persistent cough x one year, worse in the last 3 months. She associates her cough rather loosely, with drinking or working in her yard. It is relieved spontaneously. She is self-conscious about coughing in public. Never smoked and denies any history of tuberculosis or time spent in histoplasmosis or other fungal regions of the country to her knowledge. Dry cough without wheeze. No history of asthma or pneumonia. She does have some history of GERD and occasionally strangles when swallowing. Has had flu vaccine and pneumonia vaccine. She is widowed, living alone with her cat. CT scan from 2012 reviewed with her, showing benign stable nodules. CT chest 05/12/10 IMPRESSION:  Scattered pulmonary nodules measure 4 mm or less in size and are  stable from 11/07/2008. These are therefore considered benign.  Provider: Arvella Nigh   04/13/12- 80 yoF never smoker referred courtesy of Dr. Pete Glatter because of persistent cough x one year, benign lung nodules FOLLOWS FOR: patient states cough remains, not as bad--denies any other concerns at this time Tessalon no better than cough drops. Carlos American seems to help. Not noticing post nasal drip or reflux. CXR 01/12/12-reviewed IMPRESSION:  Mild hyperinflation. No active disease. Mild levoscoliosis and  mild degenerative changes thoracic spine.  Original Report Authenticated By: Natasha Mead, M.D.    ROS-see HPI Constitutional:   No-   weight loss, night sweats, fevers, chills, fatigue, lassitude. HEENT:   No-  headaches, difficulty swallowing, tooth/dental problems, sore throat,       No-  sneezing, itching, ear ache, nasal congestion, + nasal drip,  CV:  No-   chest pain, orthopnea, PND, swelling in lower extremities, anasarca, dizziness, palpitations Resp: No-   shortness of breath with exertion or at rest.              No-   productive cough,  + non-productive cough,  No- coughing up of  blood.              No-   change in color of mucus.  No- wheezing.   Skin: No-   rash or lesions. GI:  No-   heartburn, indigestion, abdominal pain, nausea, vomiting, GU:  MS:  No-   joint pain or swelling.   Neuro-     nothing unusual Psych:  No- change in mood or affect. No depression or anxiety.  No memory loss.  OBJ- Physical Exam General- Alert, Oriented, Affect-appropriate, Distress- none acute, trim Skin- rash-none, lesions- none, excoriation- none Lymphadenopathy- none Head- atraumatic            Eyes- Gross vision intact, PERRLA, conjunctivae and secretions clear            Ears- Hearing, canals-normal            Nose- Clear, no-Septal dev, mucus, polyps, erosion, perforation             Throat- Mallampati III , mucosa clear , drainage- none, tonsils- atrophic Neck- flexible , trachea midline, no stridor , thyroid nl, carotid no bruit Chest - symmetrical excursion , unlabored           Heart/CV- RRR , no murmur , no gallop  , no rub, nl s1 s2                           - JVD- none , edema- none, stasis changes- none, varices- none  Lung- clear to P&A, wheeze- none, cough- none while here , dullness-none, rub- none           Chest wall-  Abd-  Br/ Gen/ Rectal- Not done, not indicated Extrem- cyanosis- none, clubbing, none, atrophy- none, strength- nl Neuro- grossly intact to observation

## 2012-04-13 NOTE — Patient Instructions (Addendum)
Ok to use the [place] inhaler on an as needed basis  Ok to use the cough drops, sips, or any home remedies for cough that you would like to try  Please call as needed

## 2012-04-25 NOTE — Assessment & Plan Note (Signed)
Nonspecific chronic cough. We routinely watch for common triggers including post nasal drip, airway irritation/bronchospasm, GERD. If Tammy Lindsey seems to help, that maybe our best clue. Her problem really does not seem impressive as she describes it now. It may work best to see her here once more, then if stable, she can follow with Dr. Pete Glatter and return as needed.

## 2012-05-17 DIAGNOSIS — Z79899 Other long term (current) drug therapy: Secondary | ICD-10-CM | POA: Diagnosis not present

## 2012-05-17 DIAGNOSIS — K219 Gastro-esophageal reflux disease without esophagitis: Secondary | ICD-10-CM | POA: Diagnosis not present

## 2012-05-29 DIAGNOSIS — M48061 Spinal stenosis, lumbar region without neurogenic claudication: Secondary | ICD-10-CM | POA: Diagnosis not present

## 2012-06-18 ENCOUNTER — Other Ambulatory Visit: Payer: Self-pay | Admitting: *Deleted

## 2012-06-18 DIAGNOSIS — I83893 Varicose veins of bilateral lower extremities with other complications: Secondary | ICD-10-CM

## 2012-06-19 DIAGNOSIS — Z1331 Encounter for screening for depression: Secondary | ICD-10-CM | POA: Diagnosis not present

## 2012-06-19 DIAGNOSIS — H6980 Other specified disorders of Eustachian tube, unspecified ear: Secondary | ICD-10-CM | POA: Diagnosis not present

## 2012-06-19 DIAGNOSIS — Z0000 Encounter for general adult medical examination without abnormal findings: Secondary | ICD-10-CM | POA: Diagnosis not present

## 2012-06-19 DIAGNOSIS — Z79899 Other long term (current) drug therapy: Secondary | ICD-10-CM | POA: Diagnosis not present

## 2012-07-10 DIAGNOSIS — L851 Acquired keratosis [keratoderma] palmaris et plantaris: Secondary | ICD-10-CM | POA: Diagnosis not present

## 2012-07-10 DIAGNOSIS — M2010 Hallux valgus (acquired), unspecified foot: Secondary | ICD-10-CM | POA: Diagnosis not present

## 2012-07-10 DIAGNOSIS — M2040 Other hammer toe(s) (acquired), unspecified foot: Secondary | ICD-10-CM | POA: Diagnosis not present

## 2012-07-17 ENCOUNTER — Encounter: Payer: Self-pay | Admitting: Vascular Surgery

## 2012-07-18 ENCOUNTER — Ambulatory Visit (INDEPENDENT_AMBULATORY_CARE_PROVIDER_SITE_OTHER): Payer: Medicare Other | Admitting: Vascular Surgery

## 2012-07-18 ENCOUNTER — Encounter (INDEPENDENT_AMBULATORY_CARE_PROVIDER_SITE_OTHER): Payer: Medicare Other | Admitting: *Deleted

## 2012-07-18 ENCOUNTER — Encounter: Payer: Self-pay | Admitting: Vascular Surgery

## 2012-07-18 VITALS — BP 124/69 | HR 78 | Resp 16 | Ht 63.0 in | Wt 132.9 lb

## 2012-07-18 DIAGNOSIS — I83893 Varicose veins of bilateral lower extremities with other complications: Secondary | ICD-10-CM

## 2012-07-18 NOTE — Progress Notes (Signed)
[facility]  Patient name: Tammy Lindsey MRN: [id number] DOB: 09/07/1931 Sex: female  REASON FOR CONSULT: varicose veins.  HPI: ALAIJA Lindsey is a 79 y.o. female he states that she has had varicose veins for approximately one year. She states that over the last several months these have become more noticeable. She does experience some aching pain associated with standing and relieved with elevation. She denies any significant swelling or leg fatigue. There is no history of bleeding episodes related to her varicosities. She denies any history of DVT or phlebitis.  Past Medical History  Diagnosis Date  . Osteoarthritis   . Esophageal reflux   . Diverticulosis   . Hiatal hernia   . Hx of colonic polyps   . Pulmonary nodules     scattered-CT Scan July 2010, 12-10, and 04-2010-all stable felt benign   . Osteopenia     bone density 1-09 and 2-13  . Depression     Family History  Problem Relation Age of Onset  . Asthma Father   . Cancer Mother     cervical  . Heart disease Sister     Heart disease before age 60  . Hypertension Sister   . Cancer Daughter     Breast     SOCIAL HISTORY: History  Substance Use Topics  . Smoking status: Never Smoker   . Smokeless tobacco: Never Used  . Alcohol Use: 0.6 oz/week    1 Glasses of wine per week     Comment: will at times have margarita    Allergies  Allergen Reactions  . Sulfa Antibiotics    REVIEW OF SYSTEMS: Arly.Keller ] denotes positive finding; [  ] denotes negative finding  CARDIOVASCULAR:  [ ]  chest pain   [ ]  chest pressure   [ ]  palpitations   [ ]  orthopnea   [ ]  dyspnea on exertion   [ ]  claudication   [ ]  rest pain   [ ]  DVT   [ ]  phlebitis PULMONARY:   [ ]  productive cough   [ ]  asthma   [ ]  wheezing NEUROLOGIC:   Arly.Keller ] weakness  [ ]  paresthesias  [ ]  aphasia  [ ]  amaurosis  [ ]  dizziness HEMATOLOGIC:   [ ]  bleeding problems   [ ]  clotting disorders MUSCULOSKELETAL:  [ ]  joint pain   [ ]  joint  swelling [ ]  leg swelling GASTROINTESTINAL: [ ]   blood in stool  [ ]   hematemesis GENITOURINARY:  [ ]   dysuria  [ ]   hematuria PSYCHIATRIC:  [ ]  history of major depression INTEGUMENTARY:  [ ]  rashes  [ ]  ulcers CONSTITUTIONAL:  [ ]  fever   [ ]  chills  PHYSICAL EXAM: Filed Vitals:   07/18/12 1330  BP: 124/69  Pulse: 78  Resp: 16  Height: 5\' 3"  (1.6 m)  Weight: 132 lb 14.4 oz (60.283 kg)  SpO2: 100%   Body mass index is 23.55 kg/(m^2). GENERAL: The patient is a well-nourished female, in no acute distress. The vital signs are documented above. CARDIOVASCULAR: There is a regular rate and rhythm. I do not detect carotid bruits. She has palpable dorsalis pedis pulses bilaterally. There is no significant lower extremity swelling. PULMONARY: There is good air exchange bilaterally without wheezing or rales. ABDOMEN: Soft and non-tender with normal pitched bowel sounds.  MUSCULOSKELETAL: There are no major deformities or cyanosis. NEUROLOGIC: No focal weakness or paresthesias are detected. SKIN: She does have some spider veins and small  telangiectasias bilaterally. I do not see any significant truncal varicosities. PSYCHIATRIC: The patient has a normal affect.  DATA:  I have independently interpreted her venous duplex scan today. There is no evidence of DVT or superficial venous thrombophlebitis bilaterally. There is no reflux in the saphenous veins bilaterally. She does have some evidence of deep vein reflux in the femoral and popliteal veins bilaterally.  MEDICAL ISSUES:  Varicose veins of lower extremities with other complications This patient has some spider veins bilaterally which I believe could be treated with sclerotherapy. She does not have any evidence of reflux in the saphenous veins bilaterally. She does have some deep vein reflux in the common femoral and femoral veins bilaterally. There is no evidence of DVT bilaterally. We have discussed the importance of intermittent leg  elevation and also use of compression stockings. If she does elect to proceed with sclerotherapy we will be happy to arrange that.   DICKSON,CHRISTOPHER S [facility] Beeper: [phone]

## 2012-07-18 NOTE — Assessment & Plan Note (Signed)
This patient has some spider veins bilaterally which I believe could be treated with sclerotherapy. She does not have any evidence of reflux in the saphenous veins bilaterally. She does have some deep vein reflux in the common femoral and femoral veins bilaterally. There is no evidence of DVT bilaterally. We have discussed the importance of intermittent leg elevation and also use of compression stockings. If she does elect to proceed with sclerotherapy we will be happy to arrange that.

## 2012-08-22 DIAGNOSIS — M48061 Spinal stenosis, lumbar region without neurogenic claudication: Secondary | ICD-10-CM | POA: Diagnosis not present

## 2012-09-11 DIAGNOSIS — Z803 Family history of malignant neoplasm of breast: Secondary | ICD-10-CM | POA: Diagnosis not present

## 2012-09-11 DIAGNOSIS — Z1231 Encounter for screening mammogram for malignant neoplasm of breast: Secondary | ICD-10-CM | POA: Diagnosis not present

## 2012-10-04 DIAGNOSIS — D239 Other benign neoplasm of skin, unspecified: Secondary | ICD-10-CM | POA: Diagnosis not present

## 2012-10-04 DIAGNOSIS — D230 Other benign neoplasm of skin of lip: Secondary | ICD-10-CM | POA: Diagnosis not present

## 2012-10-04 DIAGNOSIS — D485 Neoplasm of uncertain behavior of skin: Secondary | ICD-10-CM | POA: Diagnosis not present

## 2012-10-04 DIAGNOSIS — D1801 Hemangioma of skin and subcutaneous tissue: Secondary | ICD-10-CM | POA: Diagnosis not present

## 2012-10-04 DIAGNOSIS — L723 Sebaceous cyst: Secondary | ICD-10-CM | POA: Diagnosis not present

## 2012-10-04 DIAGNOSIS — L821 Other seborrheic keratosis: Secondary | ICD-10-CM | POA: Diagnosis not present

## 2012-10-04 DIAGNOSIS — L719 Rosacea, unspecified: Secondary | ICD-10-CM | POA: Diagnosis not present

## 2012-10-12 ENCOUNTER — Encounter: Payer: Self-pay | Admitting: Internal Medicine

## 2012-10-12 ENCOUNTER — Ambulatory Visit (INDEPENDENT_AMBULATORY_CARE_PROVIDER_SITE_OTHER): Payer: Medicare Other | Admitting: Internal Medicine

## 2012-10-12 VITALS — BP 112/60 | HR 76 | Ht 62.5 in | Wt 138.0 lb

## 2012-10-12 DIAGNOSIS — R05 Cough: Secondary | ICD-10-CM | POA: Diagnosis not present

## 2012-10-12 DIAGNOSIS — R059 Cough, unspecified: Secondary | ICD-10-CM

## 2012-10-12 DIAGNOSIS — R053 Chronic cough: Secondary | ICD-10-CM

## 2012-10-12 NOTE — Progress Notes (Signed)
01/12/12- 89 yoF never smoker referred courtesy of Dr. Pete Glatter because of persistent cough x one year, worse in the last 3 months. She associates her cough rather loosely, with drinking or working in her yard. It is relieved spontaneously. She is self-conscious about coughing in public. Never smoked and denies any history of tuberculosis or time spent in histoplasmosis or other fungal regions of the country to her knowledge. Dry cough without wheeze. No history of asthma or pneumonia. She does have some history of GERD and occasionally strangles when swallowing. Has had flu vaccine and pneumonia vaccine. She is widowed, living alone with her cat. CT scan from 2012 reviewed with her, showing benign stable nodules. CT chest 05/12/10 IMPRESSION:  Scattered pulmonary nodules measure 4 mm or less in size and are  stable from 11/07/2008. These are therefore considered benign.  Provider: Arvella Nigh   04/13/12- 80 yoF never smoker referred courtesy of Dr. Pete Glatter because of persistent cough x one year, benign lung nodules FOLLOWS FOR: patient states cough remains, not as bad--denies any other concerns at this time Tessalon no better than cough drops. Carlos American seems to help. Not noticing post nasal drip or reflux. CXR 01/12/12-reviewed IMPRESSION:  Mild hyperinflation. No active disease. Mild levoscoliosis and  mild degenerative changes thoracic spine.  Original Report Authenticated By: Natasha Mead, M.D.   10/12/12- 40 yoF never smoker referred courtesy of Dr. Pete Glatter because of persistent cough x one year, benign lung nodules FOLLOWS FOR: dry cough at times; not as bad as it was in December 2013 Mild persistent dry cough. Uses Tudorza inhaler only before church, with cough drops. No dyspnea. Working with GI/Dr.Buccini.  ROS-see HPI Constitutional:   No-   weight loss, night sweats, fevers, chills, fatigue, lassitude. HEENT:   No-  headaches, difficulty swallowing, tooth/dental problems, sore  throat,       No-  sneezing, itching, ear ache, nasal congestion, + nasal drip,  CV:  No-   chest pain, orthopnea, PND, swelling in lower extremities, anasarca, dizziness, palpitations Resp: No-   shortness of breath with exertion or at rest.              No-   productive cough,  + non-productive cough,  No- coughing up of blood.              No-   change in color of mucus.  No- wheezing.   Skin: No-   rash or lesions. GI:  No-   heartburn, indigestion, abdominal pain, nausea, vomiting, GU:  MS:  No-   joint pain or swelling.   Neuro-     nothing unusual Psych:  No- change in mood or affect. No depression or anxiety.  No memory loss.  OBJ- Physical Exam General- Alert, Oriented, Affect-appropriate, Distress- none acute, trim Skin- rash-none, lesions- none, excoriation- none Lymphadenopathy- none Head- atraumatic            Eyes- Gross vision intact, PERRLA, conjunctivae and secretions clear            Ears- Hearing, canals-normal            Nose- Clear, no-Septal dev, mucus, polyps, erosion, perforation             Throat- Mallampati III , mucosa clear , drainage- none, tonsils- atrophic Neck- flexible , trachea midline, no stridor , thyroid nl, carotid no bruit Chest - symmetrical excursion , unlabored           Heart/CV- RRR , no murmur ,  no gallop  , no rub, nl s1 s2                           - JVD- none , edema- none, stasis changes- none, varices- none           Lung- clear to P&A, wheeze- none, cough- none while here , dullness-none, rub- none           Chest wall-  Abd-  Br/ Gen/ Rectal- Not done, not indicated Extrem- cyanosis- none, clubbing, none, atrophy- none, strength- nl Neuro- grossly intact to observation

## 2012-10-12 NOTE — Patient Instructions (Addendum)
Dr Pete Glatter can refill prescriptions as needed. We will be happy to see you again if needed

## 2012-10-28 ENCOUNTER — Encounter: Payer: Self-pay | Admitting: Internal Medicine

## 2012-10-28 NOTE — Assessment & Plan Note (Signed)
Continue to block a vagal cough pathway with [place]. Reflux management by GI She will followup with her primary physician and I will be happy to see her again if needed

## 2012-11-13 DIAGNOSIS — M545 Low back pain, unspecified: Secondary | ICD-10-CM | POA: Diagnosis not present

## 2012-12-19 DIAGNOSIS — H6120 Impacted cerumen, unspecified ear: Secondary | ICD-10-CM | POA: Diagnosis not present

## 2012-12-19 DIAGNOSIS — H905 Unspecified sensorineural hearing loss: Secondary | ICD-10-CM | POA: Diagnosis not present

## 2013-01-04 DIAGNOSIS — H903 Sensorineural hearing loss, bilateral: Secondary | ICD-10-CM | POA: Diagnosis not present

## 2013-01-07 DIAGNOSIS — H524 Presbyopia: Secondary | ICD-10-CM | POA: Diagnosis not present

## 2013-01-07 DIAGNOSIS — H2510 Age-related nuclear cataract, unspecified eye: Secondary | ICD-10-CM | POA: Diagnosis not present

## 2013-01-17 DIAGNOSIS — L819 Disorder of pigmentation, unspecified: Secondary | ICD-10-CM | POA: Diagnosis not present

## 2013-02-19 DIAGNOSIS — M545 Low back pain, unspecified: Secondary | ICD-10-CM | POA: Diagnosis not present

## 2013-03-25 DIAGNOSIS — H903 Sensorineural hearing loss, bilateral: Secondary | ICD-10-CM | POA: Diagnosis not present

## 2013-03-25 DIAGNOSIS — H6120 Impacted cerumen, unspecified ear: Secondary | ICD-10-CM | POA: Diagnosis not present

## 2013-04-17 DIAGNOSIS — M48061 Spinal stenosis, lumbar region without neurogenic claudication: Secondary | ICD-10-CM | POA: Diagnosis not present

## 2013-04-17 DIAGNOSIS — M545 Low back pain, unspecified: Secondary | ICD-10-CM | POA: Diagnosis not present

## 2013-05-06 DIAGNOSIS — T169XXA Foreign body in ear, unspecified ear, initial encounter: Secondary | ICD-10-CM | POA: Diagnosis not present

## 2013-05-06 DIAGNOSIS — IMO0002 Reserved for concepts with insufficient information to code with codable children: Secondary | ICD-10-CM | POA: Diagnosis not present

## 2013-05-13 DIAGNOSIS — H2510 Age-related nuclear cataract, unspecified eye: Secondary | ICD-10-CM | POA: Diagnosis not present

## 2013-06-27 DIAGNOSIS — R5381 Other malaise: Secondary | ICD-10-CM | POA: Diagnosis not present

## 2013-06-27 DIAGNOSIS — E7800 Pure hypercholesterolemia, unspecified: Secondary | ICD-10-CM | POA: Diagnosis not present

## 2013-06-27 DIAGNOSIS — Z1331 Encounter for screening for depression: Secondary | ICD-10-CM | POA: Diagnosis not present

## 2013-06-27 DIAGNOSIS — G479 Sleep disorder, unspecified: Secondary | ICD-10-CM | POA: Diagnosis not present

## 2013-06-27 DIAGNOSIS — Z23 Encounter for immunization: Secondary | ICD-10-CM | POA: Diagnosis not present

## 2013-06-27 DIAGNOSIS — Z0000 Encounter for general adult medical examination without abnormal findings: Secondary | ICD-10-CM | POA: Diagnosis not present

## 2013-06-27 DIAGNOSIS — R5383 Other fatigue: Secondary | ICD-10-CM | POA: Diagnosis not present

## 2013-07-17 DIAGNOSIS — M545 Low back pain, unspecified: Secondary | ICD-10-CM | POA: Diagnosis not present

## 2013-08-02 ENCOUNTER — Other Ambulatory Visit (HOSPITAL_COMMUNITY)
Admission: RE | Admit: 2013-08-02 | Discharge: 2013-08-02 | Disposition: A | Discharge status: Home / Self Care | Payer: Medicare Other | Source: Ambulatory Visit | Attending: Geriatric Medicine | Admitting: Geriatric Medicine

## 2013-08-02 ENCOUNTER — Other Ambulatory Visit: Payer: Self-pay | Admitting: Geriatric Medicine

## 2013-08-02 DIAGNOSIS — R87619 Unspecified abnormal cytological findings in specimens from cervix uteri: Secondary | ICD-10-CM | POA: Diagnosis not present

## 2013-08-02 DIAGNOSIS — R05 Cough: Secondary | ICD-10-CM | POA: Diagnosis not present

## 2013-08-02 DIAGNOSIS — Z1151 Encounter for screening for human papillomavirus (HPV): Secondary | ICD-10-CM | POA: Diagnosis not present

## 2013-08-02 DIAGNOSIS — R110 Nausea: Secondary | ICD-10-CM | POA: Diagnosis not present

## 2013-08-02 DIAGNOSIS — R059 Cough, unspecified: Secondary | ICD-10-CM | POA: Diagnosis not present

## 2013-08-02 DIAGNOSIS — Z124 Encounter for screening for malignant neoplasm of cervix: Secondary | ICD-10-CM | POA: Diagnosis not present

## 2013-08-02 DIAGNOSIS — R109 Unspecified abdominal pain: Secondary | ICD-10-CM | POA: Diagnosis not present

## 2013-08-02 DIAGNOSIS — N816 Rectocele: Secondary | ICD-10-CM | POA: Diagnosis not present

## 2013-08-02 DIAGNOSIS — K219 Gastro-esophageal reflux disease without esophagitis: Secondary | ICD-10-CM | POA: Diagnosis not present

## 2013-08-03 ENCOUNTER — Other Ambulatory Visit: Payer: Self-pay | Admitting: Internal Medicine

## 2013-08-05 ENCOUNTER — Other Ambulatory Visit: Payer: Self-pay | Admitting: Geriatric Medicine

## 2013-08-05 ENCOUNTER — Ambulatory Visit
Admission: RE | Admit: 2013-08-05 | Discharge: 2013-08-05 | Disposition: A | Discharge status: Home / Self Care | Payer: Medicare Other | Source: Ambulatory Visit | Attending: Geriatric Medicine | Admitting: Geriatric Medicine

## 2013-08-05 DIAGNOSIS — J449 Chronic obstructive pulmonary disease, unspecified: Secondary | ICD-10-CM | POA: Diagnosis not present

## 2013-08-05 DIAGNOSIS — R059 Cough, unspecified: Secondary | ICD-10-CM

## 2013-08-05 DIAGNOSIS — R05 Cough: Secondary | ICD-10-CM

## 2013-08-05 DIAGNOSIS — R269 Unspecified abnormalities of gait and mobility: Secondary | ICD-10-CM | POA: Diagnosis not present

## 2013-08-05 DIAGNOSIS — H903 Sensorineural hearing loss, bilateral: Secondary | ICD-10-CM | POA: Diagnosis not present

## 2013-08-05 DIAGNOSIS — H6120 Impacted cerumen, unspecified ear: Secondary | ICD-10-CM | POA: Diagnosis not present

## 2013-08-05 DIAGNOSIS — G253 Myoclonus: Secondary | ICD-10-CM | POA: Diagnosis not present

## 2013-08-06 ENCOUNTER — Other Ambulatory Visit: Payer: Self-pay | Admitting: Internal Medicine

## 2013-08-06 NOTE — Telephone Encounter (Signed)
Ok to refill 

## 2013-08-06 NOTE — Telephone Encounter (Signed)
CY, please advise if okay to refill; not on patients medication list and patient last seen 09-2012. Thanks.

## 2013-08-07 ENCOUNTER — Ambulatory Visit
Admission: RE | Admit: 2013-08-07 | Discharge: 2013-08-07 | Disposition: A | Discharge status: Home / Self Care | Payer: Medicare Other | Source: Ambulatory Visit | Attending: Geriatric Medicine | Admitting: Geriatric Medicine

## 2013-08-07 ENCOUNTER — Other Ambulatory Visit: Payer: Medicare Other

## 2013-08-07 DIAGNOSIS — K7689 Other specified diseases of liver: Secondary | ICD-10-CM | POA: Diagnosis not present

## 2013-08-07 DIAGNOSIS — R109 Unspecified abdominal pain: Secondary | ICD-10-CM

## 2013-08-07 MED ORDER — IOHEXOL 300 MG/ML  SOLN
100.0000 mL | Freq: Once | INTRAMUSCULAR | Status: AC | PRN
  Administered 2013-08-07: 100 mL via INTRAVENOUS

## 2013-08-23 DIAGNOSIS — R195 Other fecal abnormalities: Secondary | ICD-10-CM | POA: Diagnosis not present

## 2013-08-26 DIAGNOSIS — N814 Uterovaginal prolapse, unspecified: Secondary | ICD-10-CM | POA: Diagnosis not present

## 2013-08-26 DIAGNOSIS — N8111 Cystocele, midline: Secondary | ICD-10-CM | POA: Diagnosis not present

## 2013-08-26 DIAGNOSIS — N952 Postmenopausal atrophic vaginitis: Secondary | ICD-10-CM | POA: Diagnosis not present

## 2013-09-12 DIAGNOSIS — Z803 Family history of malignant neoplasm of breast: Secondary | ICD-10-CM | POA: Diagnosis not present

## 2013-09-12 DIAGNOSIS — Z1231 Encounter for screening mammogram for malignant neoplasm of breast: Secondary | ICD-10-CM | POA: Diagnosis not present

## 2013-10-24 ENCOUNTER — Other Ambulatory Visit: Payer: Self-pay | Admitting: Dermatology

## 2013-10-24 DIAGNOSIS — D1801 Hemangioma of skin and subcutaneous tissue: Secondary | ICD-10-CM | POA: Diagnosis not present

## 2013-10-24 DIAGNOSIS — D485 Neoplasm of uncertain behavior of skin: Secondary | ICD-10-CM | POA: Diagnosis not present

## 2013-10-24 DIAGNOSIS — D239 Other benign neoplasm of skin, unspecified: Secondary | ICD-10-CM | POA: Diagnosis not present

## 2013-10-24 DIAGNOSIS — L821 Other seborrheic keratosis: Secondary | ICD-10-CM | POA: Diagnosis not present

## 2013-10-24 DIAGNOSIS — L819 Disorder of pigmentation, unspecified: Secondary | ICD-10-CM | POA: Diagnosis not present

## 2013-12-11 DIAGNOSIS — M545 Low back pain, unspecified: Secondary | ICD-10-CM | POA: Diagnosis not present

## 2014-01-06 DIAGNOSIS — H903 Sensorineural hearing loss, bilateral: Secondary | ICD-10-CM | POA: Diagnosis not present

## 2014-02-05 DIAGNOSIS — Z23 Encounter for immunization: Secondary | ICD-10-CM | POA: Diagnosis not present

## 2014-02-07 ENCOUNTER — Other Ambulatory Visit: Payer: Self-pay | Admitting: Internal Medicine

## 2014-02-12 ENCOUNTER — Other Ambulatory Visit: Payer: Self-pay | Admitting: Geriatric Medicine

## 2014-02-12 DIAGNOSIS — K769 Liver disease, unspecified: Secondary | ICD-10-CM

## 2014-02-13 DIAGNOSIS — Z79899 Other long term (current) drug therapy: Secondary | ICD-10-CM | POA: Diagnosis not present

## 2014-02-14 ENCOUNTER — Ambulatory Visit
Admission: RE | Admit: 2014-02-14 | Discharge: 2014-02-14 | Disposition: A | Discharge status: Home / Self Care | Payer: Medicare Other | Source: Ambulatory Visit | Attending: Geriatric Medicine | Admitting: Geriatric Medicine

## 2014-02-14 DIAGNOSIS — K769 Liver disease, unspecified: Secondary | ICD-10-CM | POA: Diagnosis not present

## 2014-02-14 DIAGNOSIS — Z9049 Acquired absence of other specified parts of digestive tract: Secondary | ICD-10-CM | POA: Diagnosis not present

## 2014-02-14 DIAGNOSIS — Z9089 Acquired absence of other organs: Secondary | ICD-10-CM | POA: Diagnosis not present

## 2014-02-14 MED ORDER — IOHEXOL 350 MG/ML SOLN
100.0000 mL | Freq: Once | INTRAVENOUS | Status: AC | PRN
  Administered 2014-02-14: 100 mL via INTRAVENOUS

## 2014-04-01 DIAGNOSIS — M4806 Spinal stenosis, lumbar region: Secondary | ICD-10-CM | POA: Diagnosis not present

## 2014-04-15 DIAGNOSIS — D1801 Hemangioma of skin and subcutaneous tissue: Secondary | ICD-10-CM | POA: Diagnosis not present

## 2014-04-15 DIAGNOSIS — D485 Neoplasm of uncertain behavior of skin: Secondary | ICD-10-CM | POA: Diagnosis not present

## 2014-05-20 DIAGNOSIS — H524 Presbyopia: Secondary | ICD-10-CM | POA: Diagnosis not present

## 2014-05-20 DIAGNOSIS — H2513 Age-related nuclear cataract, bilateral: Secondary | ICD-10-CM | POA: Diagnosis not present

## 2014-07-03 DIAGNOSIS — Z0000 Encounter for general adult medical examination without abnormal findings: Secondary | ICD-10-CM | POA: Diagnosis not present

## 2014-07-10 DIAGNOSIS — H6983 Other specified disorders of Eustachian tube, bilateral: Secondary | ICD-10-CM | POA: Diagnosis not present

## 2014-07-10 DIAGNOSIS — H905 Unspecified sensorineural hearing loss: Secondary | ICD-10-CM | POA: Diagnosis not present

## 2014-07-10 DIAGNOSIS — H6123 Impacted cerumen, bilateral: Secondary | ICD-10-CM | POA: Diagnosis not present

## 2014-07-10 DIAGNOSIS — H938X2 Other specified disorders of left ear: Secondary | ICD-10-CM | POA: Diagnosis not present

## 2014-07-11 ENCOUNTER — Other Ambulatory Visit: Payer: Self-pay | Admitting: Otolaryngology

## 2014-07-11 DIAGNOSIS — H938X2 Other specified disorders of left ear: Secondary | ICD-10-CM

## 2014-07-24 ENCOUNTER — Ambulatory Visit
Admission: RE | Admit: 2014-07-24 | Discharge: 2014-07-24 | Disposition: A | Discharge status: Home / Self Care | Payer: Medicare Other | Source: Ambulatory Visit | Attending: Otolaryngology | Admitting: Otolaryngology

## 2014-07-24 DIAGNOSIS — H938X2 Other specified disorders of left ear: Secondary | ICD-10-CM

## 2014-07-24 MED ORDER — GADOBENATE DIMEGLUMINE 529 MG/ML IV SOLN
12.0000 mL | Freq: Once | INTRAVENOUS | Status: AC | PRN
  Administered 2014-07-24: 12 mL via INTRAVENOUS

## 2014-07-28 DIAGNOSIS — F325 Major depressive disorder, single episode, in full remission: Secondary | ICD-10-CM | POA: Diagnosis not present

## 2014-07-28 DIAGNOSIS — E780 Pure hypercholesterolemia: Secondary | ICD-10-CM | POA: Diagnosis not present

## 2014-07-28 DIAGNOSIS — M8580 Other specified disorders of bone density and structure, unspecified site: Secondary | ICD-10-CM | POA: Diagnosis not present

## 2014-07-28 DIAGNOSIS — Z0000 Encounter for general adult medical examination without abnormal findings: Secondary | ICD-10-CM | POA: Diagnosis not present

## 2014-07-28 DIAGNOSIS — K219 Gastro-esophageal reflux disease without esophagitis: Secondary | ICD-10-CM | POA: Diagnosis not present

## 2014-07-28 DIAGNOSIS — K121 Other forms of stomatitis: Secondary | ICD-10-CM | POA: Diagnosis not present

## 2014-07-28 DIAGNOSIS — R739 Hyperglycemia, unspecified: Secondary | ICD-10-CM | POA: Diagnosis not present

## 2014-07-28 DIAGNOSIS — Z79899 Other long term (current) drug therapy: Secondary | ICD-10-CM | POA: Diagnosis not present

## 2014-08-05 DIAGNOSIS — M4806 Spinal stenosis, lumbar region: Secondary | ICD-10-CM | POA: Diagnosis not present

## 2014-08-27 DIAGNOSIS — R739 Hyperglycemia, unspecified: Secondary | ICD-10-CM | POA: Diagnosis not present

## 2014-08-27 DIAGNOSIS — M810 Age-related osteoporosis without current pathological fracture: Secondary | ICD-10-CM | POA: Diagnosis not present

## 2014-09-02 DIAGNOSIS — N952 Postmenopausal atrophic vaginitis: Secondary | ICD-10-CM | POA: Diagnosis not present

## 2014-09-02 DIAGNOSIS — N812 Incomplete uterovaginal prolapse: Secondary | ICD-10-CM | POA: Diagnosis not present

## 2014-09-02 DIAGNOSIS — N8111 Cystocele, midline: Secondary | ICD-10-CM | POA: Diagnosis not present

## 2014-09-23 DIAGNOSIS — Z1231 Encounter for screening mammogram for malignant neoplasm of breast: Secondary | ICD-10-CM | POA: Diagnosis not present

## 2014-09-28 DIAGNOSIS — W57XXXA Bitten or stung by nonvenomous insect and other nonvenomous arthropods, initial encounter: Secondary | ICD-10-CM | POA: Diagnosis not present

## 2014-09-28 DIAGNOSIS — A6920 Lyme disease, unspecified: Secondary | ICD-10-CM | POA: Diagnosis not present

## 2014-10-06 ENCOUNTER — Other Ambulatory Visit: Payer: Self-pay | Admitting: Internal Medicine

## 2014-10-14 DIAGNOSIS — K219 Gastro-esophageal reflux disease without esophagitis: Secondary | ICD-10-CM | POA: Diagnosis not present

## 2014-10-14 DIAGNOSIS — M810 Age-related osteoporosis without current pathological fracture: Secondary | ICD-10-CM | POA: Diagnosis not present

## 2014-12-03 DIAGNOSIS — M4806 Spinal stenosis, lumbar region: Secondary | ICD-10-CM | POA: Diagnosis not present

## 2014-12-26 DIAGNOSIS — D1801 Hemangioma of skin and subcutaneous tissue: Secondary | ICD-10-CM | POA: Diagnosis not present

## 2014-12-26 DIAGNOSIS — D2271 Melanocytic nevi of right lower limb, including hip: Secondary | ICD-10-CM | POA: Diagnosis not present

## 2014-12-26 DIAGNOSIS — D485 Neoplasm of uncertain behavior of skin: Secondary | ICD-10-CM | POA: Diagnosis not present

## 2014-12-26 DIAGNOSIS — D2272 Melanocytic nevi of left lower limb, including hip: Secondary | ICD-10-CM | POA: Diagnosis not present

## 2014-12-26 DIAGNOSIS — L821 Other seborrheic keratosis: Secondary | ICD-10-CM | POA: Diagnosis not present

## 2014-12-26 DIAGNOSIS — D692 Other nonthrombocytopenic purpura: Secondary | ICD-10-CM | POA: Diagnosis not present

## 2014-12-26 DIAGNOSIS — D2261 Melanocytic nevi of right upper limb, including shoulder: Secondary | ICD-10-CM | POA: Diagnosis not present

## 2014-12-26 DIAGNOSIS — L814 Other melanin hyperpigmentation: Secondary | ICD-10-CM | POA: Diagnosis not present

## 2015-01-07 DIAGNOSIS — D1801 Hemangioma of skin and subcutaneous tissue: Secondary | ICD-10-CM | POA: Diagnosis not present

## 2015-01-07 DIAGNOSIS — D485 Neoplasm of uncertain behavior of skin: Secondary | ICD-10-CM | POA: Diagnosis not present

## 2015-01-26 DIAGNOSIS — H938X2 Other specified disorders of left ear: Secondary | ICD-10-CM | POA: Diagnosis not present

## 2015-01-26 DIAGNOSIS — Z23 Encounter for immunization: Secondary | ICD-10-CM | POA: Diagnosis not present

## 2015-03-11 DIAGNOSIS — M4806 Spinal stenosis, lumbar region: Secondary | ICD-10-CM | POA: Diagnosis not present

## 2015-05-01 DIAGNOSIS — H903 Sensorineural hearing loss, bilateral: Secondary | ICD-10-CM | POA: Diagnosis not present

## 2015-05-01 DIAGNOSIS — H938X2 Other specified disorders of left ear: Secondary | ICD-10-CM | POA: Diagnosis not present

## 2015-05-01 DIAGNOSIS — H6983 Other specified disorders of Eustachian tube, bilateral: Secondary | ICD-10-CM | POA: Diagnosis not present

## 2015-05-05 ENCOUNTER — Other Ambulatory Visit: Payer: Self-pay | Admitting: Otolaryngology

## 2015-05-05 DIAGNOSIS — C44209 Unspecified malignant neoplasm of skin of left ear and external auricular canal: Secondary | ICD-10-CM | POA: Diagnosis not present

## 2015-05-05 DIAGNOSIS — H6983 Other specified disorders of Eustachian tube, bilateral: Secondary | ICD-10-CM | POA: Diagnosis not present

## 2015-05-05 DIAGNOSIS — L110 Acquired keratosis follicularis: Secondary | ICD-10-CM | POA: Diagnosis not present

## 2015-05-26 DIAGNOSIS — H524 Presbyopia: Secondary | ICD-10-CM | POA: Diagnosis not present

## 2015-05-26 DIAGNOSIS — H2513 Age-related nuclear cataract, bilateral: Secondary | ICD-10-CM | POA: Diagnosis not present

## 2015-06-09 DIAGNOSIS — H938X2 Other specified disorders of left ear: Secondary | ICD-10-CM | POA: Diagnosis not present

## 2015-06-09 DIAGNOSIS — H903 Sensorineural hearing loss, bilateral: Secondary | ICD-10-CM | POA: Diagnosis not present

## 2015-06-09 DIAGNOSIS — F325 Major depressive disorder, single episode, in full remission: Secondary | ICD-10-CM | POA: Insufficient documentation

## 2015-06-09 DIAGNOSIS — Z9622 Myringotomy tube(s) status: Secondary | ICD-10-CM | POA: Diagnosis not present

## 2015-06-09 DIAGNOSIS — Z974 Presence of external hearing-aid: Secondary | ICD-10-CM | POA: Diagnosis not present

## 2015-06-11 DIAGNOSIS — H2511 Age-related nuclear cataract, right eye: Secondary | ICD-10-CM | POA: Diagnosis not present

## 2015-06-11 DIAGNOSIS — H25812 Combined forms of age-related cataract, left eye: Secondary | ICD-10-CM | POA: Diagnosis not present

## 2015-06-17 DIAGNOSIS — R5383 Other fatigue: Secondary | ICD-10-CM | POA: Diagnosis not present

## 2015-06-17 DIAGNOSIS — R42 Dizziness and giddiness: Secondary | ICD-10-CM | POA: Diagnosis not present

## 2015-06-17 DIAGNOSIS — R51 Headache: Secondary | ICD-10-CM | POA: Diagnosis not present

## 2015-07-01 ENCOUNTER — Other Ambulatory Visit: Payer: Self-pay | Admitting: Otolaryngology

## 2015-07-01 DIAGNOSIS — H938X2 Other specified disorders of left ear: Secondary | ICD-10-CM

## 2015-07-02 ENCOUNTER — Encounter: Payer: Self-pay | Admitting: Neurology

## 2015-07-02 ENCOUNTER — Ambulatory Visit (INDEPENDENT_AMBULATORY_CARE_PROVIDER_SITE_OTHER): Payer: PPO | Admitting: Neurology

## 2015-07-02 VITALS — BP 132/82 | HR 86 | Resp 20 | Ht 63.0 in | Wt 126.0 lb

## 2015-07-02 DIAGNOSIS — R200 Anesthesia of skin: Secondary | ICD-10-CM | POA: Diagnosis not present

## 2015-07-02 MED ORDER — GABAPENTIN 100 MG PO CAPS: 100.0000 mg | ORAL_CAPSULE | Freq: Two times a day (BID) | ORAL | Status: DC

## 2015-07-02 NOTE — Progress Notes (Signed)
Reason for visit: Facial numbness  Referring physician: Dr. Dario Guardian is a 82 y.o. female  History of present illness:  Tammy Lindsey is an 82 year old right-handed white female with a history of some numbness around Tammy left ear that began about one year ago. Tammy Lindsey was seen by Dr. Redmond Baseman, MRI evaluation of Tammy brain was done with and without gadolinium at that time, and was unremarkable. Tammy Lindsey indicates that over time she has had gradual spread of Tammy numbness to include a narrow strip of skin from Tammy temporal area to down into Tammy region just in front of Tammy ear. Tammy Lindsey has no numbness on Tammy face, or on Tammy back of Tammy head. Tammy Lindsey has also had some problems with hearing bilaterally, slightly worse on Tammy left. Tammy Lindsey indicates that her hearing aid does not work well on Tammy left. She noted 2 weeks ago that she had some nausea and vomiting. Since that time she has developed some malaise, she feels slightly dizzy with a lightheaded floaty feeling that has persisted. She does not believe that she has recovered from this illness. She denies any ongoing fevers. She denies weakness or numbness of Tammy extremities, she denies any difficulty with balance, no falls. She denies issues controlling Tammy bowels or Tammy bladder. She has no true vertigo. She denies any speech issues or problems swallowing. She has been set up for another MRI Tammy brain by Dr. Redmond Baseman, this is to be done in Tammy near future. She is sent to this office for an evaluation. Blood work has been done recently that has included a CBC, comprehensive metabolic profile, and sedimentation rate which were all unremarkable.  Past Medical History  Diagnosis Date  . Osteoarthritis   . Esophageal reflux   . Diverticulosis   . Hiatal hernia   . Hx of colonic polyps   . Pulmonary nodules     scattered-CT Scan July 2010, 12-10, and 04-2010-all stable felt benign   . Osteopenia     bone density 1-09 and 2-13    . Depression     Past Surgical History  Procedure Laterality Date  . Tonsillectomy  1952  . Rotator cuff repair      x 2 1998 and 1999  . Cholecystectomy  1974    Gall Bladder  . Spine surgery  1985    disk  . Orthroscopic  rotator cuff Left 2011  . Partial shoulder Left 2005  . Joint replacement Left May 2012  . Joint replacement Left 2012    Reverse     Family History  Problem Relation Age of Onset  . Asthma Father   . Cancer Mother     cervical  . Heart disease Sister     Heart disease before age 33  . Hypertension Sister   . Cancer Daughter     Breast     Social history:  reports that she has never smoked. She has never used smokeless tobacco. She reports that she drinks about 0.6 oz of alcohol per week. She reports that she does not use illicit drugs.  Medications:  Prior to Admission medications   Medication Sig Start Date End Date Taking? Authorizing Provider  ALPRAZolam (XANAX) 0.25 MG tablet Take 0.25 mg by mouth daily.   Yes Historical Provider, MD  aspirin 81 MG tablet Take 81 mg by mouth daily.   Yes Historical Provider, MD  calcium carbonate (TUMS - DOSED IN MG ELEMENTAL CALCIUM)  500 MG chewable tablet Chew by mouth every other day.   Yes Historical Provider, MD  Calcium Carbonate-Vitamin D 600-400 MG-UNIT per tablet Take 1 tablet by mouth daily.   Yes Historical Provider, MD  fluocinonide cream (LIDEX) 0.05 % continuous as needed. 07/05/12  Yes Historical Provider, MD  LIDODERM 5 % 1 patch continuous as needed. To intact skin remove after 12 hours once a day 01/10/12  Yes Historical Provider, MD  Multiple Vitamin (MULTIVITAMIN) tablet Take 1 tablet by mouth daily.   Yes Historical Provider, MD  omeprazole-sodium bicarbonate (ZEGERID) 40-1100 MG per capsule Take 1 tablet by mouth daily. 01/10/12  Yes Historical Provider, MD  triamcinolone (KENALOG) 0.1 % paste Place 1 application onto teeth continuous as needed.    Yes Historical Provider, MD  TUDORZA PRESSAIR  400 MCG/ACT AEPB USE TWICE DAILY   Yes Deneise Lever, MD  gabapentin (NEURONTIN) 100 MG capsule Take 1 capsule (100 mg total) by mouth 2 (two) times daily. 07/02/15   Kathrynn Ducking, MD      Allergies  Allergen Reactions  . Sulfa Antibiotics     ROS:  Out of a complete 14 system review of symptoms, Tammy Lindsey complains only of Tammy following symptoms, and all other reviewed systems are negative.  Hearing loss Itching Blurred vision Cough Runny nose Numbness, dizziness Decreased energy, change in appetite  Blood pressure 132/82, pulse 86, resp. rate 20, height 5\' 3"  (1.6 m), weight 126 lb (57.153 kg).  Physical Exam  General: Tammy Lindsey is alert and cooperative at Tammy time of Tammy examination.  Eyes: Pupils are equal, round, and reactive to light. Discs are flat bilaterally.  Neck: Tammy neck is supple, no carotid bruits are noted.  Respiratory: Tammy respiratory examination is clear.  Cardiovascular: Tammy cardiovascular examination reveals a regular rate and rhythm, no obvious murmurs or rubs are noted.  Skin: Extremities are without significant edema.  Neurologic Exam  Mental status: Tammy Lindsey is alert and oriented x 3 at Tammy time of Tammy examination. Tammy Lindsey has apparent normal recent and remote memory, with an apparently normal attention span and concentration ability.  Cranial nerves: Facial symmetry is present. There is good sensation of Tammy face to pinprick and soft touch bilaterally, with exception of a narrow strip of decreased sensation from Tammy left temporal area at Tammy hairline to just in front of left ear. Tammy strength of Tammy facial muscles and Tammy muscles to head turning and shoulder shrug are normal bilaterally. Speech is well enunciated, no aphasia or dysarthria is noted. Extraocular movements are full. Visual fields are full. Tammy tongue is midline, and Tammy Lindsey has symmetric elevation of Tammy soft palate. No obvious hearing deficits are noted.  Motor:  Tammy motor testing reveals 5 over 5 strength of all 4 extremities. Good symmetric motor tone is noted throughout.  Sensory: Sensory testing is intact to pinprick, soft touch, vibration sensation, and position sense on all 4 extremities. No evidence of extinction is noted.  Coordination: Cerebellar testing reveals good finger-nose-finger and heel-to-shin bilaterally.  Gait and station: Gait is normal. Tandem gait is normal. Romberg is negative. No drift is seen.  Reflexes: Deep tendon reflexes are symmetric and normal bilaterally. Toes are downgoing bilaterally.   Assessment/Plan:  1. Subjective numbness, left temporal area  2. Mild lightheaded sensations following gastroenteritis illness  Tammy objective examination today is unremarkable. MRI of Tammy brain done 1 year ago was unremarkable. Tammy Lindsey is to have a repeat MRI of Tammy  brain. If this is unremarkable, I would not pursue further workup. Tammy Lindsey indicates that Tammy numbness is uncomfortable for her, but she denies any actual pain. We will give a brief trial on low-dose gabapentin, if she finds that this is helpful, this can be continued.  Jill Alexanders MD 07/02/2015 7:18 PM  [facility] [address] [place], [place] [address]  Phone [phone] Fax [phone]

## 2015-07-02 NOTE — Patient Instructions (Signed)
   Neurontin (gabapentin) may result in drowsiness, ankle swelling, gait instability, or possibly dizziness. Please contact our office if significant side effects occur with this medication.  

## 2015-07-03 ENCOUNTER — Telehealth: Payer: Self-pay | Admitting: Neurology

## 2015-07-03 NOTE — Telephone Encounter (Signed)
Patient called, was seen by Dr. Jannifer Franklin March 9th, her Rx was not called in, I checked the order, gabapentin 100mg  bid was e Fax in March 9th

## 2015-07-10 ENCOUNTER — Ambulatory Visit
Admission: RE | Admit: 2015-07-10 | Discharge: 2015-07-10 | Disposition: A | Discharge status: Home / Self Care | Payer: PPO | Source: Ambulatory Visit | Attending: Otolaryngology | Admitting: Otolaryngology

## 2015-07-10 DIAGNOSIS — R200 Anesthesia of skin: Secondary | ICD-10-CM | POA: Diagnosis not present

## 2015-07-10 DIAGNOSIS — H938X2 Other specified disorders of left ear: Secondary | ICD-10-CM

## 2015-07-10 MED ORDER — GADOBENATE DIMEGLUMINE 529 MG/ML IV SOLN
10.0000 mL | Freq: Once | INTRAVENOUS | Status: AC | PRN
  Administered 2015-07-10: 10 mL via INTRAVENOUS

## 2015-07-16 DIAGNOSIS — H26491 Other secondary cataract, right eye: Secondary | ICD-10-CM | POA: Diagnosis not present

## 2015-07-22 DIAGNOSIS — M4806 Spinal stenosis, lumbar region: Secondary | ICD-10-CM | POA: Diagnosis not present

## 2015-07-29 DIAGNOSIS — F325 Major depressive disorder, single episode, in full remission: Secondary | ICD-10-CM | POA: Diagnosis not present

## 2015-07-29 DIAGNOSIS — K219 Gastro-esophageal reflux disease without esophagitis: Secondary | ICD-10-CM | POA: Diagnosis not present

## 2015-07-29 DIAGNOSIS — E7800 Pure hypercholesterolemia, unspecified: Secondary | ICD-10-CM | POA: Diagnosis not present

## 2015-07-29 DIAGNOSIS — R202 Paresthesia of skin: Secondary | ICD-10-CM | POA: Diagnosis not present

## 2015-07-29 DIAGNOSIS — Z0000 Encounter for general adult medical examination without abnormal findings: Secondary | ICD-10-CM | POA: Diagnosis not present

## 2015-07-29 DIAGNOSIS — M810 Age-related osteoporosis without current pathological fracture: Secondary | ICD-10-CM | POA: Diagnosis not present

## 2015-07-30 DIAGNOSIS — H2512 Age-related nuclear cataract, left eye: Secondary | ICD-10-CM | POA: Diagnosis not present

## 2015-07-30 DIAGNOSIS — H25812 Combined forms of age-related cataract, left eye: Secondary | ICD-10-CM | POA: Diagnosis not present

## 2015-08-08 DIAGNOSIS — J101 Influenza due to other identified influenza virus with other respiratory manifestations: Secondary | ICD-10-CM | POA: Diagnosis not present

## 2015-08-08 DIAGNOSIS — R05 Cough: Secondary | ICD-10-CM | POA: Diagnosis not present

## 2015-09-02 DIAGNOSIS — Z961 Presence of intraocular lens: Secondary | ICD-10-CM | POA: Diagnosis not present

## 2015-09-08 DIAGNOSIS — N898 Other specified noninflammatory disorders of vagina: Secondary | ICD-10-CM | POA: Diagnosis not present

## 2015-09-08 DIAGNOSIS — Z01411 Encounter for gynecological examination (general) (routine) with abnormal findings: Secondary | ICD-10-CM | POA: Diagnosis not present

## 2015-09-08 DIAGNOSIS — N8111 Cystocele, midline: Secondary | ICD-10-CM | POA: Diagnosis not present

## 2015-09-08 DIAGNOSIS — N814 Uterovaginal prolapse, unspecified: Secondary | ICD-10-CM | POA: Diagnosis not present

## 2015-09-22 DIAGNOSIS — Z803 Family history of malignant neoplasm of breast: Secondary | ICD-10-CM | POA: Diagnosis not present

## 2015-09-22 DIAGNOSIS — Z1231 Encounter for screening mammogram for malignant neoplasm of breast: Secondary | ICD-10-CM | POA: Diagnosis not present

## 2015-09-28 ENCOUNTER — Telehealth: Payer: Self-pay | Admitting: Neurology

## 2015-09-28 MED ORDER — GABAPENTIN 100 MG PO CAPS: 100.0000 mg | ORAL_CAPSULE | Freq: Two times a day (BID) | ORAL | Status: DC

## 2015-09-28 NOTE — Telephone Encounter (Signed)
Ma Rings [phone] called sts she rec'd refusal for gabapentin (NEURONTIN) 100 MG capsule "pt unknown to prescriber". Please call or resend RX.

## 2015-09-28 NOTE — Telephone Encounter (Signed)
Retailed as requested.

## 2015-11-13 DIAGNOSIS — M47816 Spondylosis without myelopathy or radiculopathy, lumbar region: Secondary | ICD-10-CM | POA: Diagnosis not present

## 2015-11-13 DIAGNOSIS — M545 Low back pain: Secondary | ICD-10-CM | POA: Diagnosis not present

## 2015-12-22 DIAGNOSIS — H903 Sensorineural hearing loss, bilateral: Secondary | ICD-10-CM | POA: Diagnosis not present

## 2015-12-22 DIAGNOSIS — H938X2 Other specified disorders of left ear: Secondary | ICD-10-CM | POA: Diagnosis not present

## 2016-01-11 DIAGNOSIS — R42 Dizziness and giddiness: Secondary | ICD-10-CM | POA: Diagnosis not present

## 2016-01-15 ENCOUNTER — Ambulatory Visit: Payer: PPO | Attending: Geriatric Medicine | Admitting: Physical Therapy

## 2016-01-15 DIAGNOSIS — R42 Dizziness and giddiness: Secondary | ICD-10-CM | POA: Diagnosis not present

## 2016-01-17 NOTE — Therapy (Signed)
[facility] [address] [place], [place], [address] Phone: [phone]   Fax:  [phone]  Physical Therapy Evaluation  Patient Details  Name: Tammy Lindsey MRN: [id number] Date of Birth: 05/10/1931 Referring Provider: Dr. Lajean Manes  Encounter Date: 01/15/2016      PT End of Session - 01/17/16 1850    Visit Number 1   Number of Visits 1   Authorization Type [employer]   Authorization Time Period 01-15-16 - 03-15-16   PT Start Time 1316   PT Stop Time 1406   PT Time Calculation (min) 50 min      Past Medical History:  Diagnosis Date  . Depression   . Diverticulosis   . Esophageal reflux   . Hiatal hernia   . Hx of colonic polyps   . Osteoarthritis   . Osteopenia    bone density 1-09 and 2-13  . Pulmonary nodules    scattered-CT Scan July 2010, 12-10, and 04-2010-all stable felt benign     Past Surgical History:  Procedure Laterality Date  . CHOLECYSTECTOMY  1974   Gall Bladder  . JOINT REPLACEMENT Left May 2012  . JOINT REPLACEMENT Left 2012   Reverse   . orthroscopic  Rotator Cuff Left 2011  . Partial shoulder Left 2005  . ROTATOR CUFF REPAIR     x 2 1998 and 1999  . SPINE SURGERY  1985   disk  . TONSILLECTOMY  1952    There were no vitals filed for this visit.       Subjective Assessment - 01/17/16 1845    Subjective Pt reports vertigo started about 2 months ago - has gotten worse - started as a "light-headed feeling"; is intermittent in occurrence; no c/o vertigo at eval but states "I've been sitting for a long time";  pt reports she gets dizzy with walking and with sitting up - states "I've learned to sit on the side of the bed for a while"   Pertinent History Numbness on L side face - has been going on for over a year   Patient Stated Goals Resolve the vertigo   Currently in Pain? No/denies            [facility] PT Assessment - 01/17/16 0001      Assessment   Medical  Diagnosis BPPV: Vertigo   Referring Provider Dr. Lajean Manes   Onset Date/Surgical Date --  about a month ago     Balance Screen   Has the patient fallen in the past 6 months No   Has the patient had a decrease in activity level because of a fear of falling?  No   Is the patient reluctant to leave their home because of a fear of falling?  No            Vestibular Assessment - 01/17/16 0001      Vestibular Assessment   General Observation pt is an 82 yr old lady with c/o dizziness with sitting up, standing up and with walking     Symptom Behavior   Type of Dizziness Lightheadedness   Frequency of Dizziness occurs with positional changes of supine to sit and sit to stand; also occurs with walking   Duration of Dizziness seconds to minutes   Aggravating Factors Activity in general   Relieving Factors Rest     Occulomotor Exam   Occulomotor Alignment Normal   Spontaneous Absent   Smooth Pursuits Intact   Saccades Intact  Positional Testing   Dix-Hallpike Dix-Hallpike Right;Dix-Hallpike Left   Sidelying Test Sidelying Right;Sidelying Left     Dix-Hallpike Right   Dix-Hallpike Right Symptoms No nystagmus     Dix-Hallpike Left   Dix-Hallpike Left Duration lightheadedness with return to sitting   Dix-Hallpike Left Symptoms No nystagmus     Sidelying Right   Sidelying Right Duration lightheadedness with return to sitting   Sidelying Right Symptoms No nystagmus     Sidelying Left   Sidelying Left Duration lightheadedness reported with return to sitting   Sidelying Left Symptoms No nystagmus     Equities trader Comment WNL's with score 72/100 with N=65/100; somatosensory, visual and vestibular inputs all WNL's     Positional Sensitivities   Sit to Supine No dizziness   Supine to Left Side No dizziness   Supine to Right Side No dizziness   Supine to Sitting Moderate dizziness   Right Hallpike No dizziness    Up from Right Hallpike Moderate dizziness   Up from Left Hallpike Moderate dizziness   Positional Sensitivities Comments BP fluctuations recorded with supine to sit and with sit to stand     Orthostatics   BP supine (x 5 minutes) 145/69   HR supine (x 5 minutes) 65   BP sitting 149/82   HR sitting 68   BP standing (after 1 minute) 162/80   HR standing (after 1 minute) 73   BP standing (after 3 minutes) 156/79   Orthostatics Comment BP seated 163/79 prior to lying down for orthostatic assessment      BP recorded in seated position 163/79 prior to lying down for orthostatic hypotension assessment;  BP recorded as 139/70 upon lying supine;  Orthostatic assessment then performed - see readings above  SOT performed - all inputs Normal for somatosensory, visual and vestibular; composite score 72/100 with N=65/100  BP recorded again after this test completed -  155/74 after standing approx. 20" for SOT; pt transferred to supine - BP 153/69; sitting up BP145/76 With pt reporting feeling very dizzy/light-headed Pt cont to deny feeling true room spinning vertigo                 PT Education - 01/17/16 1847    Education provided Yes   Education Details Pt educated on results of evaluation with no signs/symptoms consistent with BPPV at this time;  instructed pt to do ankle pumps upon transferring from supine to sit and then do marching upon initial standing from seated position - techniques to increase BP to decrease dizziness   Person(s) Educated Patient   Methods Explanation;Demonstration   Comprehension Verbalized understanding             PT Long Term Goals - 01/17/16 1904      PT LONG TERM GOAL #1   Title N/A - eval only      Recommend further diagnostic work up as signs and symptoms do not appear to be consistent with BPPV at this time, but  Rather due to BP changes with positional changes         Plan - 01/17/16 1851    Clinical Impression Statement  Pt is an 82 year old lady with no signs or symptoms consistent with BPPV at time of initial PT evaluation.  Pt does have symptoms consistent with orthostatic hypotension at times as well as significant fluctuations (increase in BP) with change in position - please see eval note for specific readings.  No nystagmus observed in any position, however, pt did report significant light-headedness with supine to sit and with sit to stand; pt denies any true room-spinning vertigo.  Sensory Organization Test score is WNL's with all inputs of somatosensory, visual, and vestibular WNL's.  Composite score 72/100 with N= 65/100.  Balance is WNL's as well as gait.  Pt would benefit from further diagnostic workup, including assessment for orthostatic hypotension as BP fluctuations noted during eval.                                                                                                                                      Rehab Potential Good   PT Frequency 1x / week   PT Duration --  Evaluation only    PT Treatment/Interventions Patient/family education;ADLs/Self Care Home Management;Neuromuscular re-education;Other (comment)   PT Next Visit Plan N/A -- eval only   PT Home Exercise Plan None needed - dizziness does not appear to be of vestibular etiology at this time   Recommended Other Services refer pt back to MD for further diagnostic workup   Consulted and Agree with Plan of Care Patient      Patient will benefit from skilled therapeutic intervention in order to improve the following deficits and impairments:  Dizziness  Visit Diagnosis: Dizziness and giddiness - Plan: PT plan of care cert/re-cert     Problem List Patient Active Problem List   Diagnosis Date Noted  . Numbness 07/02/2015  . Varicose veins of lower extremities with other complications [id number]  . Cough, persistent 01/22/2012    Josph Norfleet, [place], PT 01/17/2016, 7:46 PM  [facility] [address] [place], [place], [address] Phone: [phone]   Fax:  [phone]  Name: Tammy Lindsey MRN: [id number] Date of Birth: 03/18/1932

## 2016-01-25 DIAGNOSIS — R42 Dizziness and giddiness: Secondary | ICD-10-CM | POA: Diagnosis not present

## 2016-02-03 DIAGNOSIS — H5310 Unspecified subjective visual disturbances: Secondary | ICD-10-CM | POA: Diagnosis not present

## 2016-02-18 DIAGNOSIS — K219 Gastro-esophageal reflux disease without esophagitis: Secondary | ICD-10-CM | POA: Diagnosis not present

## 2016-03-02 DIAGNOSIS — M545 Low back pain: Secondary | ICD-10-CM | POA: Diagnosis not present

## 2016-03-24 DIAGNOSIS — D2272 Melanocytic nevi of left lower limb, including hip: Secondary | ICD-10-CM | POA: Diagnosis not present

## 2016-03-24 DIAGNOSIS — L649 Androgenic alopecia, unspecified: Secondary | ICD-10-CM | POA: Diagnosis not present

## 2016-03-24 DIAGNOSIS — D1801 Hemangioma of skin and subcutaneous tissue: Secondary | ICD-10-CM | POA: Diagnosis not present

## 2016-03-24 DIAGNOSIS — Z419 Encounter for procedure for purposes other than remedying health state, unspecified: Secondary | ICD-10-CM | POA: Diagnosis not present

## 2016-03-24 DIAGNOSIS — L819 Disorder of pigmentation, unspecified: Secondary | ICD-10-CM | POA: Diagnosis not present

## 2016-03-24 DIAGNOSIS — D2271 Melanocytic nevi of right lower limb, including hip: Secondary | ICD-10-CM | POA: Diagnosis not present

## 2016-03-24 DIAGNOSIS — L814 Other melanin hyperpigmentation: Secondary | ICD-10-CM | POA: Diagnosis not present

## 2016-03-24 DIAGNOSIS — D225 Melanocytic nevi of trunk: Secondary | ICD-10-CM | POA: Diagnosis not present

## 2016-04-07 DIAGNOSIS — H26492 Other secondary cataract, left eye: Secondary | ICD-10-CM | POA: Diagnosis not present

## 2016-04-14 IMAGING — MR MR HEAD WO/W CM
10 of 11 series · 32 of 48 positions shown · IV contrast (12 ml multihance)
Comparison: None.

CLINICAL DATA: 83-year-old female with initial onset of left ear
disturbance over the past month. Left ear feels stopped up and has
noticed hearing loss. No injury. Initial encounter.

BUN and creatinine were obtained on site at [HOSPITAL] at
[HOSPITAL].
Results:  BUN 15 mg/dL,  Creatinine 0.8 mg/dL.
EXAM:
MRI HEAD WITHOUT AND WITH CONTRAST
TECHNIQUE: Multiplanar, multiecho pulse sequences of the brain and surrounding
structures were obtained without and with intravenous contrast.
CONTRAST:  12mL MULTIHANCE GADOBENATE DIMEGLUMINE 529 MG/ML IV SOLN

[Series 2: T1 · sagittal · 5.0mm · 0.45mm/px · 4 of 21 slices shown (1 of 3)]
[im 1/21]
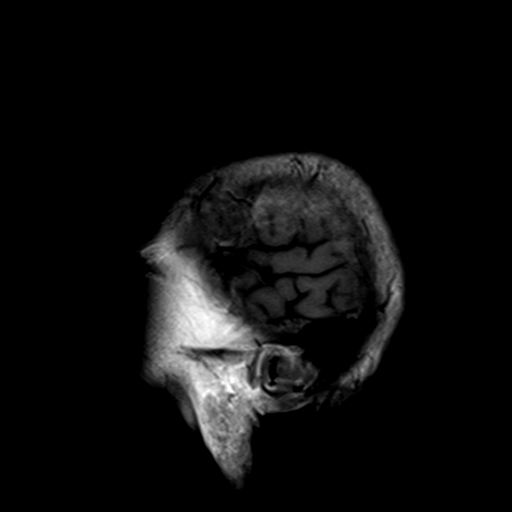
[im 7/21]
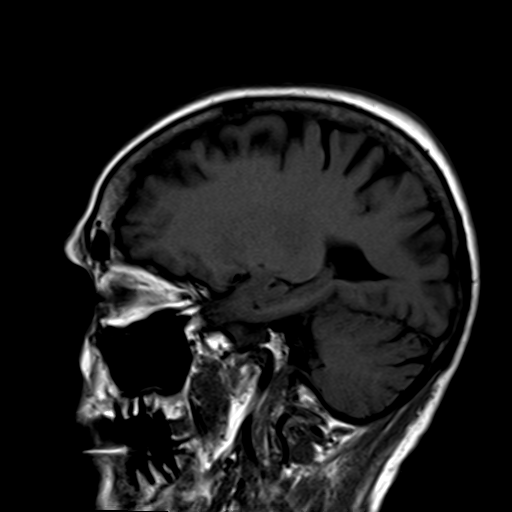
[im 14/21]
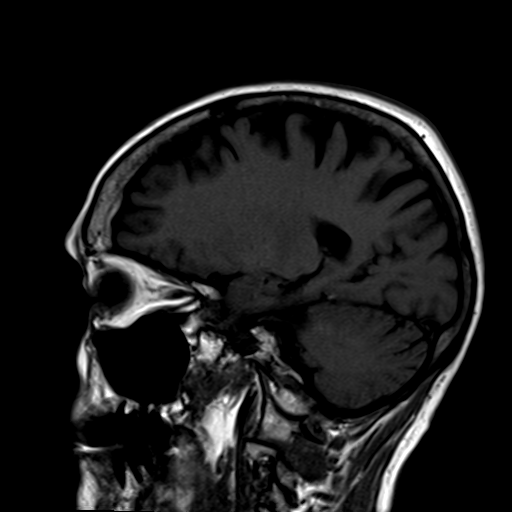
[im 21/21]
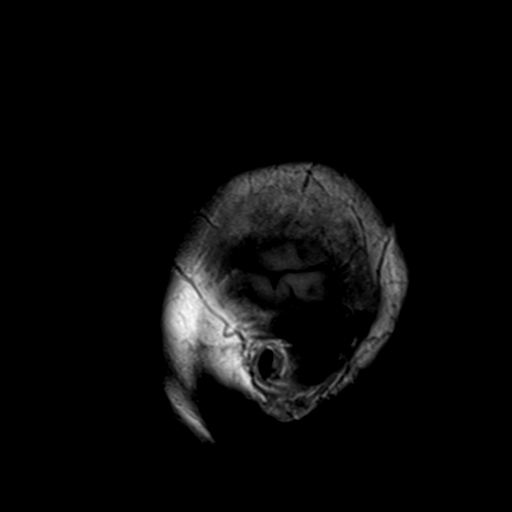

[Series 3: DWI · axial · 3.0mm · 1.80mm/px · z∈[-55,+92]mm · 8 of 100 slices shown (1 of 2)]
[im 1/100]
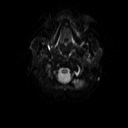
[im 16/100]
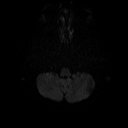
[im 31/100]
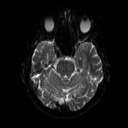
[im 46/100]
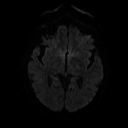
[im 54/100]
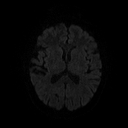
[im 69/100]
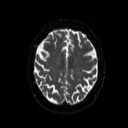
[im 84/100]
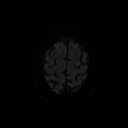
[im 100/100]
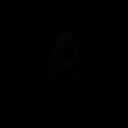

[Series 4: DWI · axial · 3.0mm · 1.80mm/px · z∈[-55,+92]mm · 6 of 48 slices shown (2 of 2)]
[im 1/48]
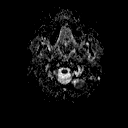
[im 10/48]
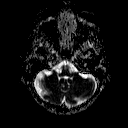
[im 19/48]
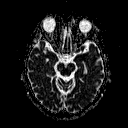
[im 29/48]
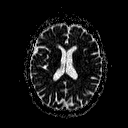
[im 38/48]
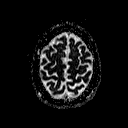
[im 48/48]
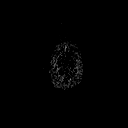

[Series 5: T2 · axial · 5.0mm · 0.45mm/px · z∈[-48,+95]mm · 3 of 23 slices shown]
[im 1/23]
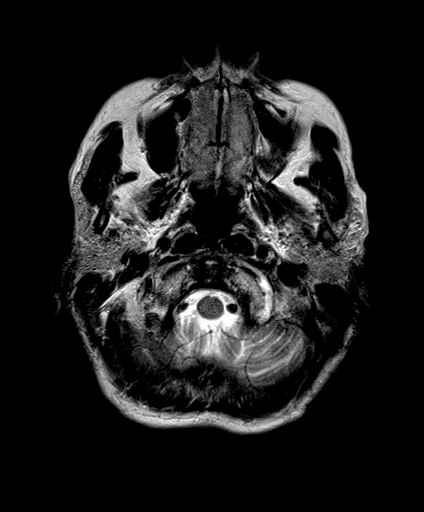
[im 12/23]
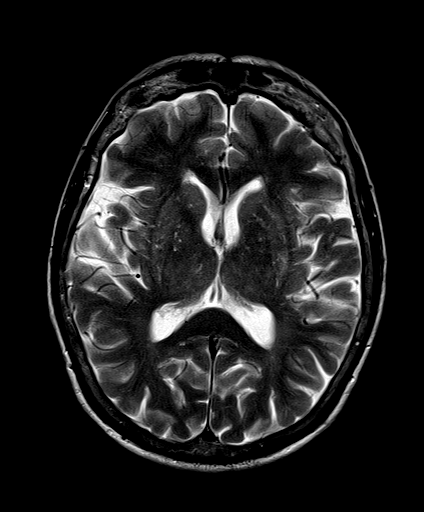
[im 23/23]
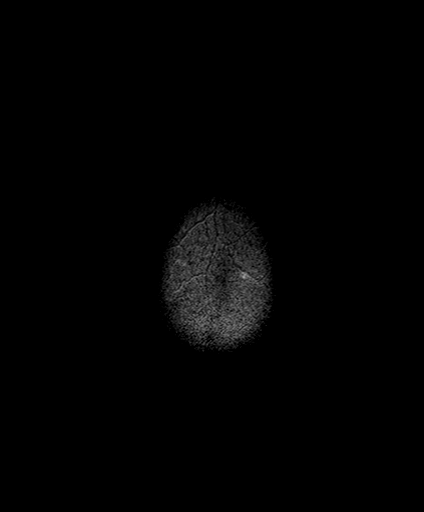

[Series 6: FLAIR · axial · 5.0mm · 0.45mm/px · z∈[-47,+95]mm · 3 of 23 slices shown]
[im 1/23]
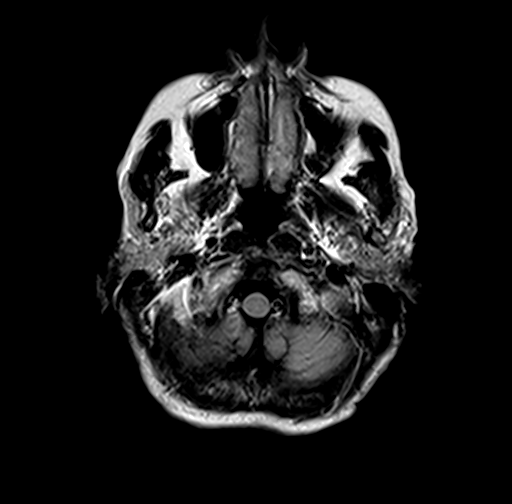
[im 12/23]
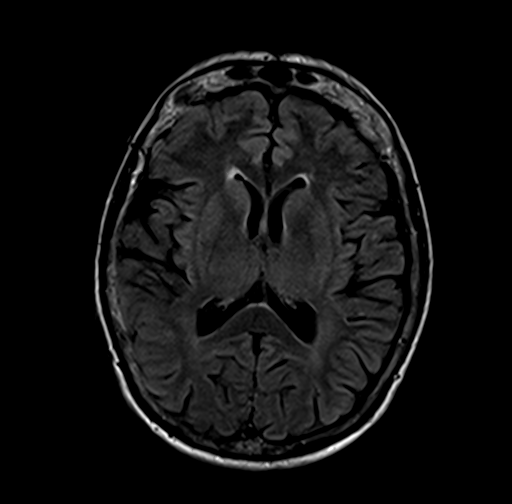
[im 23/23]
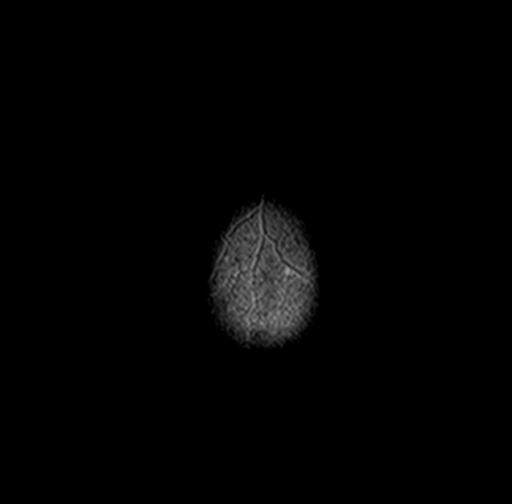

[Series 7: T1 · coronal · 3.0mm · 0.35mm/px · 1 of 11 slices shown (2 of 3)]
[im 1/11]
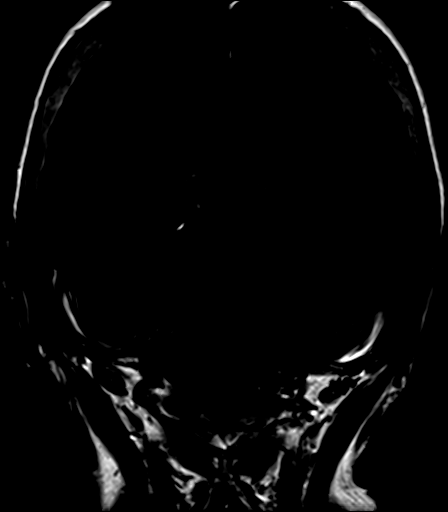

[Series 8: T1 · axial · 3.0mm · 0.35mm/px · 1 of 11 slices shown (3 of 3)]
[im 1/11]
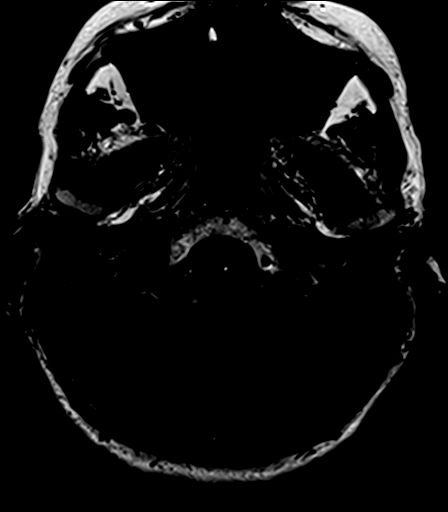

[Series 9: bSSFP · axial · 1.0mm · 0.28mm/px · z∈[-40,-14]mm · 4 of 36 slices shown]
[im 1/36]
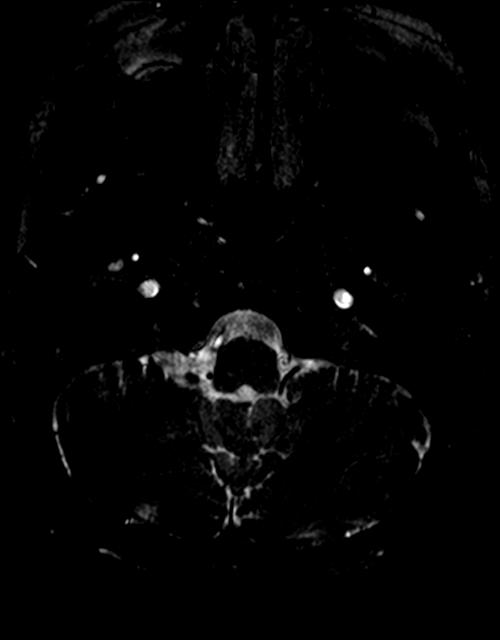
[im 9/36]
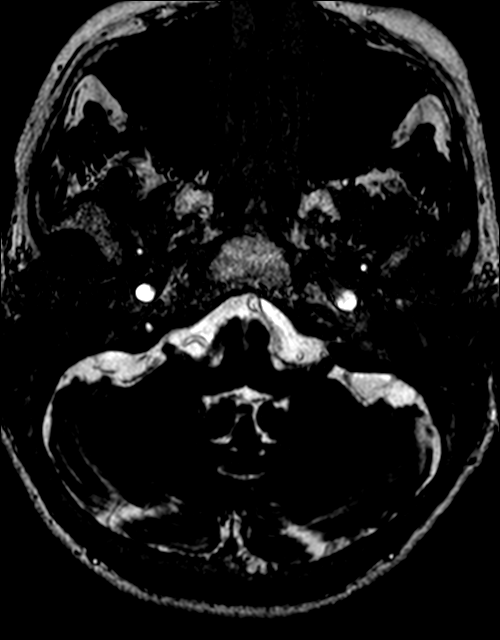
[im 18/36]
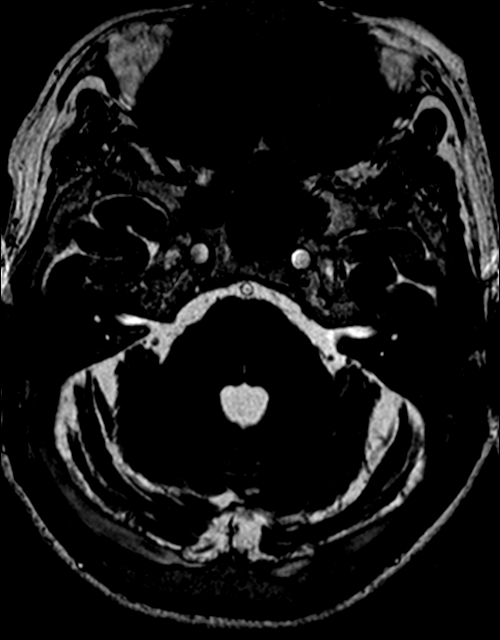
[im 27/36]
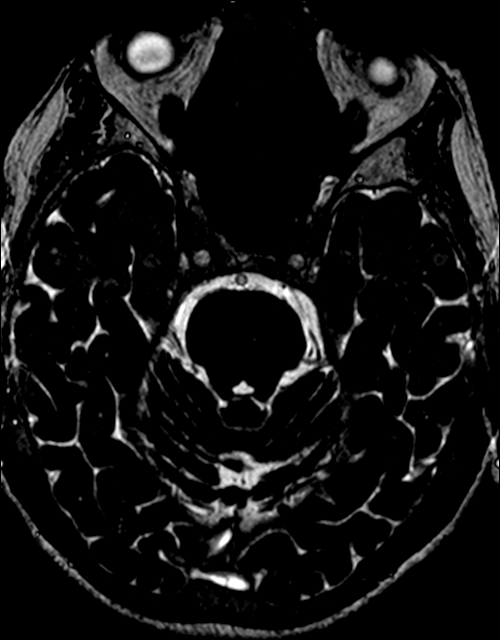

[Series 10: T1 post-contrast · coronal · 3.0mm · 0.35mm/px · 1 of 11 slices shown (1 of 2)]
[im 1/11]
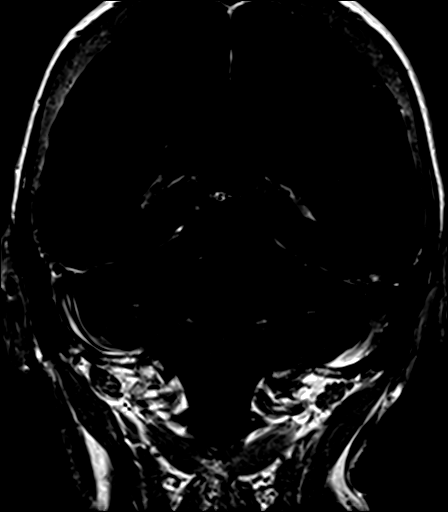

[Series 11: T1 post-contrast · axial · 3.0mm · 0.35mm/px · 1 of 11 slices shown (2 of 2)]
[im 1/11]
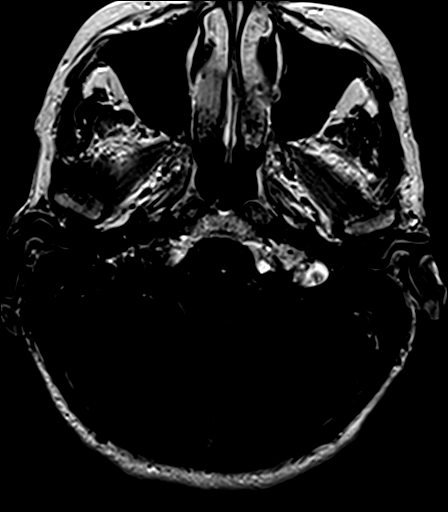

[32 of 48 positions shown; findings below may reference images not displayed]

FINDINGS: Labyrinthine structures are symmetric and normal bilaterally without
evidence of internal auditory canal enhancing lesion.

There is a prominent vein superior to the left internal auditory
canal most likely incidental finding rather than underlying dural
fistula.

No acute infarct.

No intracranial hemorrhage.

Minimal small vessel disease type changes.

No hydrocephalus.

Major intracranial vascular structures are patent.

No intracranial mass.

Mild transverse ligament hypertrophy. Cervical medullary junction,
pituitary region, pineal region and orbital structures unremarkable.

Minimal mucosal thickening ethmoid sinus air cells and inferior left
maxillary sinus.

Mastoid air cells and middle ear cavities appear clear.

No mass detected at the level of the posterior superior nasopharynx.
IMPRESSION: Essentially negative MR brain as noted above.

## 2016-06-23 DIAGNOSIS — M5136 Other intervertebral disc degeneration, lumbar region: Secondary | ICD-10-CM | POA: Diagnosis not present

## 2016-06-30 DIAGNOSIS — K625 Hemorrhage of anus and rectum: Secondary | ICD-10-CM | POA: Diagnosis not present

## 2016-07-19 DIAGNOSIS — M5136 Other intervertebral disc degeneration, lumbar region: Secondary | ICD-10-CM | POA: Diagnosis not present

## 2016-07-21 ENCOUNTER — Telehealth: Payer: Self-pay | Admitting: Neurology

## 2016-07-21 NOTE — Telephone Encounter (Signed)
Our office has gotten a new referral on Tammy Lindsey who has seen Dr, Jannifer Franklin 06/2015 pt stated that she is wanting to switch her care over to Dr. Jaynee Eagles. Is this ok?

## 2016-07-21 NOTE — Telephone Encounter (Signed)
Okay with me to switch 

## 2016-07-22 NOTE — Telephone Encounter (Signed)
Tammy Lindsey, that is fine with me. Schedule her for an hour though. I may not have anything until end of April or May if she can wait that long. If not just schedule her for 30 minutes. thanks

## 2016-08-02 DIAGNOSIS — K219 Gastro-esophageal reflux disease without esophagitis: Secondary | ICD-10-CM | POA: Diagnosis not present

## 2016-08-02 DIAGNOSIS — Z0000 Encounter for general adult medical examination without abnormal findings: Secondary | ICD-10-CM | POA: Diagnosis not present

## 2016-08-02 DIAGNOSIS — L565 Disseminated superficial actinic porokeratosis (DSAP): Secondary | ICD-10-CM | POA: Diagnosis not present

## 2016-08-02 DIAGNOSIS — M79671 Pain in right foot: Secondary | ICD-10-CM | POA: Diagnosis not present

## 2016-08-02 DIAGNOSIS — Z23 Encounter for immunization: Secondary | ICD-10-CM | POA: Diagnosis not present

## 2016-08-02 DIAGNOSIS — F325 Major depressive disorder, single episode, in full remission: Secondary | ICD-10-CM | POA: Diagnosis not present

## 2016-08-02 DIAGNOSIS — R05 Cough: Secondary | ICD-10-CM | POA: Diagnosis not present

## 2016-08-02 DIAGNOSIS — L84 Corns and callosities: Secondary | ICD-10-CM | POA: Diagnosis not present

## 2016-08-02 DIAGNOSIS — K921 Melena: Secondary | ICD-10-CM | POA: Diagnosis not present

## 2016-08-02 DIAGNOSIS — Z79899 Other long term (current) drug therapy: Secondary | ICD-10-CM | POA: Diagnosis not present

## 2016-08-02 DIAGNOSIS — M810 Age-related osteoporosis without current pathological fracture: Secondary | ICD-10-CM | POA: Diagnosis not present

## 2016-08-12 DIAGNOSIS — K648 Other hemorrhoids: Secondary | ICD-10-CM | POA: Diagnosis not present

## 2016-08-12 DIAGNOSIS — K625 Hemorrhage of anus and rectum: Secondary | ICD-10-CM | POA: Diagnosis not present

## 2016-08-15 ENCOUNTER — Ambulatory Visit (INDEPENDENT_AMBULATORY_CARE_PROVIDER_SITE_OTHER): Payer: PPO | Admitting: Neurology

## 2016-08-15 ENCOUNTER — Encounter (INDEPENDENT_AMBULATORY_CARE_PROVIDER_SITE_OTHER): Payer: Self-pay

## 2016-08-15 ENCOUNTER — Encounter: Payer: Self-pay | Admitting: Neurology

## 2016-08-15 VITALS — BP 152/82 | HR 79 | Ht 62.5 in | Wt 129.0 lb

## 2016-08-15 DIAGNOSIS — M2141 Flat foot [pes planus] (acquired), right foot: Secondary | ICD-10-CM | POA: Diagnosis not present

## 2016-08-15 DIAGNOSIS — M5417 Radiculopathy, lumbosacral region: Secondary | ICD-10-CM | POA: Diagnosis not present

## 2016-08-15 DIAGNOSIS — R29898 Other symptoms and signs involving the musculoskeletal system: Secondary | ICD-10-CM

## 2016-08-15 NOTE — Progress Notes (Signed)
[facility]    Provider:  Dr Jaynee Eagles Referring Provider: Suella Broad MD Primary Care Physician:  Mathews Argyle, MD  CC:  Left foot weakness  HPI:  Tammy Lindsey is a 83 y.o. female here as a referral from Dr. Nelva Bush for left foot weakness. Past medical history osteoarthritis, diverticulosis, osteopenia, depression, spinal stenosis of lumbar region, degenerative lumbar disc. She can be walking along and right leg at the ankle gives way. As she I walking the right ankle feels weak. She has epiudural steroids on the leg because that is where the pain. The pain in her back and left leg is improved with ESI and the pain in the back goes down both legs but mostly in the thighs and not down to her feet. The right ankle weaknes is not painful and doesntt hurt. No inciting events or falls or injury. She does not cross her legs. Has not lost weight recently. No immobility recently. The weakness happens at the end of her injection cycle. There is some pain on the lateral right lower leg.   Reviewed notes, labs and imaging from outside physicians, which showed:  Reviewed previous neurology notes. She was originally seen in March 2017 with a history of some numbness around the left ear that began a year prior. MRI of the brain was done with and without contrast and was unremarkable. Over time she had gradual spread of the numbness to including airstrip the skin from the temporal area down into the region in front of the ear. No numbness in the face on the back of the head. Also problems with hearing bilaterally. Exam showed decreased sensation in a narrow strip from the left temporal area at the hairline to just in front of the left ear. Otherwise exam was normal. She was prescribed gabapentin.  Reviewed notes from [place]. Patient was seen for back pain. Patient was using Lidoderm patches for pain relief for current pain level VI out of 10. The patient reported  improvement of her symptoms with cortisol injections also had one November 2017. Patient had lumbar epidural steroid injection L5-S1 at the end of March this year. She did get good relief from her lumbar epidural steroid injection. Unfortunately she is having recurrent pain. She has pain in her back radiating into her anterior thigh bilaterally. Negative straight leg raise on the left. Negative cross straight leg raise on the right. Good hip range of motion which does not reproduce any of her pain. She received good benefit from lumbar steroid injections.  Personally reviewed images of the brain which was unremarkable 2016 and 2017    Review of Systems: Patient complains of symptoms per HPI as well as the following symptoms: No chest pain or shortness of breath. Pertinent negatives per HPI. All others negative.   Social History   Social History  . Marital status: Widowed    Spouse name: N/A  . Number of children: 2  . Years of education: N/A   Occupational History  . Retired    Social History Main Topics  . Smoking status: Never Smoker  . Smokeless tobacco: Never Used  . Alcohol use 0.6 oz/week    1 Glasses of wine per week     Comment: will at times have margarita  . Drug use: No  . Sexual activity: Not on file   Other Topics Concern  . Not on file   Social History Narrative   Lives at [facility]   Right-handed   Caffeine:  1 cup of coffee in the morning    Family History  Problem Relation Age of Onset  . Asthma Father   . Cancer Mother     cervical  . Heart disease Sister     Heart disease before age 34  . Hypertension Sister   . Cancer Daughter     Breast   . Neuropathy Neg Hx     Past Medical History:  Diagnosis Date  . Depression   . Diverticulosis   . Esophageal reflux   . Hiatal hernia   . Hx of colonic polyps   . Osteoarthritis   . Osteopenia    bone density 1-09 and 2-13  . Pulmonary nodules    scattered-CT Scan July 2010, 12-10, and  04-2010-all stable felt benign     Past Surgical History:  Procedure Laterality Date  . CHOLECYSTECTOMY  1974   Gall Bladder  . JOINT REPLACEMENT Left May 2012  . JOINT REPLACEMENT Left 2012   Reverse   . orthroscopic  Rotator Cuff Left 2011  . Partial shoulder Left 2005  . ROTATOR CUFF REPAIR     x 2 1998 and 1999  . SPINE SURGERY  1985   disk  . TONSILLECTOMY  1952    Current Outpatient Prescriptions  Medication Sig Dispense Refill  . aspirin 81 MG tablet Take 81 mg by mouth daily.    . calcium carbonate (TUMS - DOSED IN MG ELEMENTAL CALCIUM) 500 MG chewable tablet Chew by mouth every other day.    . Calcium Carbonate-Vitamin D 600-400 MG-UNIT per tablet Take 1 tablet by mouth daily.    Marland Kitchen DEXILANT 60 MG capsule     . fluocinonide cream (LIDEX) 0.05 % continuous as needed.    Marland Kitchen LIDODERM 5 % 1 patch continuous as needed. To intact skin remove after 12 hours once a day    . LORazepam (ATIVAN) 0.5 MG tablet Take 0.5 mg by mouth.    Marland Kitchen omeprazole-sodium bicarbonate (ZEGERID) 40-1100 MG per capsule Take 1 tablet by mouth daily.    Marland Kitchen triamcinolone (KENALOG) 0.1 % paste Place 1 application onto teeth continuous as needed.      No current facility-administered medications for this visit.     Allergies as of 08/15/2016 - Review Complete 08/15/2016  Allergen Reaction Noted  . Sulfa antibiotics  01/12/2012    Vitals: BP (!) 152/82   Pulse 79   Ht 5' 2.5" (1.588 m)   Wt 129 lb (58.5 kg)   BMI 23.22 kg/m  Last Weight:  Wt Readings from Last 1 Encounters:  08/15/16 129 lb (58.5 kg)   Last Height:   Ht Readings from Last 1 Encounters:  08/15/16 5' 2.5" (1.588 m)   Physical exam: Exam: Gen: NAD, conversant, well nourised, thin, well groomed                     CV: RRR, no MRG. No Carotid Bruits. No peripheral edema, warm, nontender Eyes: Conjunctivae clear without exudates or hemorrhage  Neuro: Detailed Neurologic Exam  Speech:    Speech is normal; fluent and  spontaneous with normal comprehension.  Cognition:    The patient is oriented to person, place, and time;     recent and remote memory intact;     language fluent;     normal attention, concentration,     fund of knowledge Cranial Nerves:    The pupils are equal, round, and reactive to light. The fundi are normal and spontaneous  venous pulsations are present. Visual fields are full to finger confrontation. Extraocular movements are intact. Trigeminal sensation is intact and the muscles of mastication are normal. The face is symmetric. The palate elevates in the midline. Hearing intact. Voice is normal. Shoulder shrug is normal. The tongue has normal motion without fasciculations.   Coordination:    No dysmetria  Gait:    Not ataxic  Motor Observation:    No asymmetry, no atrophy, and no involuntary movements noted. Tone:    Normal muscle tone.    Posture:    Posture is normal. normal erect    Strength:    Strength is V/V in the upper and lower limbs.      Sensation: decreased lateral right lower keg and dorsum of the right foot     Reflex Exam:  DTR's:    Deep tendon reflexes in the upper and lower extremities are brisk bilaterally.   Toes:    The toes are downgoing bilaterally.   Clonus:    Clonus is absent.       Assessment/Plan:  Lovely patient with right dorsiflexion weakness and  decreased lateral sensation to pin prick right lower keg and dorsum of the right foot. May be right L5 radiculopathy versus peroneal neuropathy. She declines EMG nerve conduction study at this time.  - Physical therapy for right dorsiflexion weakness likely from right L5 radiculopathy vs peroneal neuropathy - Recommend right L5 ESI (In addition to the left she already gets) - Follow precautions, discussed fall risk with patient  Orders Placed This Encounter  Procedures  . Ambulatory referral to Physical Therapy    Cc: Dr. Nelva Bush, Dr. Casilda Carls, [facility] [address] [place], [place] [address]  Phone [phone] Fax [phone]

## 2016-08-15 NOTE — Patient Instructions (Signed)
Remember to drink plenty of fluid, eat healthy meals and do not skip any meals. Try to eat protein with a every meal and eat a healthy snack such as fruit or nuts in between meals. Try to keep a regular sleep-wake schedule and try to exercise daily, particularly in the form of walking, 20-30 minutes a day, if you can.   Recommend physical therapy and right-sided epidural steroid injections  I would like to see you back as needed, sooner if we need to. Please call us with any interim questions, concerns, problems, updates or refill requests.   Our phone number is [phone]. We also have an after hours call service for urgent matters and there is a physician on-call for urgent questions. For any emergencies you know to call 911 or go to the nearest emergency room

## 2016-08-16 ENCOUNTER — Encounter: Payer: Self-pay | Admitting: Neurology

## 2016-08-31 ENCOUNTER — Ambulatory Visit: Payer: PPO | Attending: Neurology

## 2016-08-31 DIAGNOSIS — R2689 Other abnormalities of gait and mobility: Secondary | ICD-10-CM

## 2016-08-31 DIAGNOSIS — G8929 Other chronic pain: Secondary | ICD-10-CM | POA: Insufficient documentation

## 2016-08-31 DIAGNOSIS — M5441 Lumbago with sciatica, right side: Secondary | ICD-10-CM | POA: Diagnosis not present

## 2016-08-31 DIAGNOSIS — R42 Dizziness and giddiness: Secondary | ICD-10-CM | POA: Insufficient documentation

## 2016-08-31 DIAGNOSIS — M6281 Muscle weakness (generalized): Secondary | ICD-10-CM | POA: Diagnosis not present

## 2016-08-31 NOTE — Patient Instructions (Addendum)
Perform all exercises below:  Hold _20___ seconds. Repeat _3___ times.  Do __3__ sessions per day. CAUTION: Movement should be gentle, steady and slow.  Knee to Chest   Knee to Chest (Flexion)    Pull knee toward chest. Feel stretch in lower back or buttock area. Breathing deeply, Hold __20__ seconds. Repeat with other knee. Repeat _3___ times. Do _3___ sessions per day.  http://gt2.exer.us/226   Copyright  VHI. All rights reserved.    HIP: Hamstrings - Short Sitting   Rest leg on raised surface. Keep knee straight. Lift chest.   Straight Leg Raise   Tighten top of left thigh.  Lift leg 5-10 times.  Do 2 sets of 10.   Copyright  VHI. All rights reserved.   KNEE: Extension, Long Arc Quads - Sitting    Raise leg until knee is straight.  Hold 5 seconds  __10_ reps per set, __2_ sets per day  Copyright  VHI. All rights reserved.     [facility] [address], [place], [place] [address] Phone # [phone] Fax [phone]

## 2016-08-31 NOTE — Therapy (Signed)
[facility] [address], [place], [place], [address] Phone: [phone]   Fax:  [phone]  Physical Therapy Evaluation  Patient Details  Name: Tammy Lindsey MRN: [id number] Date of Birth: 1932-03-13 Referring Provider: Sarina Ill, MD  Encounter Date: 08/31/2016      PT End of Session - 08/31/16 1146    Visit Number 1   Number of Visits 10   Date for PT Re-Evaluation 10/26/16   PT Start Time 1111   PT Stop Time 1146   PT Time Calculation (min) 35 min   Activity Tolerance Patient tolerated treatment well   Behavior During Therapy [facility] for tasks assessed/performed      Past Medical History:  Diagnosis Date  . Depression   . Diverticulosis   . Esophageal reflux   . Hiatal hernia   . Hx of colonic polyps   . Osteoarthritis   . Osteopenia    bone density 1-09 and 2-13  . Pulmonary nodules    scattered-CT Scan July 2010, 12-10, and 04-2010-all stable felt benign     Past Surgical History:  Procedure Laterality Date  . CHOLECYSTECTOMY  1974   Gall Bladder  . JOINT REPLACEMENT Left May 2012  . JOINT REPLACEMENT Left 2012   Reverse   . orthroscopic  Rotator Cuff Left 2011  . Partial shoulder Left 2005  . ROTATOR CUFF REPAIR     x 2 1998 and 1999  . SPINE SURGERY  1985   disk  . TONSILLECTOMY  1952    There were no vitals filed for this visit.       Subjective Assessment - 08/31/16 1113    Subjective Pt reports to PT with Rt LE/ankle weakness that is causing her to be off balance.  Pt also reports LBP that is chronic.     Currently in Pain? Yes   Pain Score 5    Pain Location Back   Pain Orientation Lower;Right;Left   Pain Descriptors / Indicators Nagging   Pain Type Chronic pain   Pain Onset More than a month ago   Pain Frequency Intermittent   Aggravating Factors  gardening, activity   Pain Relieving Factors not being as active, ice, heat, Tylenol            [facility] PT Assessment -  08/31/16 0001      Assessment   Medical Diagnosis lumbosacral radiuclopathy at L5, weakness of Rt foot   Referring Provider Sarina Ill, MD   Next MD Visit none schedule     Precautions   Precautions None     Restrictions   Weight Bearing Restrictions No     Balance Screen   Has the patient fallen in the past 6 months No   Has the patient had a decrease in activity level because of a fear of falling?  No   Is the patient reluctant to leave their home because of a fear of falling?  No     Home Environment   Living Environment Private residence   Type of Home Assisted living   Additional Comments [facility]     Prior Function   Level of [place] Retired   Leisure gardening, painting, exercise class at [employer]   Overall Cognitive Status Within Functional Limits for tasks assessed     Observation/Other Assessments   Focus on Therapeutic Outcomes (FOTO)  39% limitation     Posture/Postural Control   Posture/Postural Control  Postural limitations   Postural Limitations Rounded Shoulders;Forward head     ROM / Strength   AROM / PROM / Strength AROM;PROM;Strength     AROM   Overall AROM  Within functional limits for tasks performed   Overall AROM Comments full lumbar A/ROM without pain today     PROM   Overall PROM  Deficits   Overall PROM Comments Hip IR/ER is limited by 25% bilaterally without pain     Strength   Overall Strength Deficits   Strength Assessment Site Knee;Hip;Ankle   Right/Left Hip Right;Left   Right Hip Flexion 4-/5   Right Hip Extension 4-/5   Right Hip ABduction 4/5   Left Hip Flexion 4+/5   Left Hip Extension 4+/5   Left Hip ABduction 4+/5   Right/Left Knee Right;Left   Right Knee Flexion 4/5   Right Knee Extension 4/5   Left Knee Flexion 4+/5   Left Knee Extension 4+/5   Right/Left Ankle Right;Left   Right Ankle Dorsiflexion 4-/5   Left Ankle Dorsiflexion 4+/5     Palpation   Palpation  comment tension in bil lumbar paraspinals and proximal gluteals.  No pain.     Transfers   Transfers Sit to Stand;Stand to Sit   Sit to Stand With upper extremity assist   Five time sit to stand comments  16 seconds   Stand to Sit With upper extremity assist     Ambulation/Gait   Ambulation/Gait Yes   Ambulation/Gait Assistance 7: Independent                           PT Education - 08/31/16 1135    Education provided Yes   Education Details hip flexibility, long arc quads and SLR   Person(s) Educated Patient   Methods Explanation;Demonstration;Handout   Comprehension Verbalized understanding;Returned demonstration          PT Short Term Goals - 08/31/16 1152      PT SHORT TERM GOAL #1   Title be independent in initial HEP   Time 4   Period Weeks   Status New     PT SHORT TERM GOAL #2   Title verbalize understanding of fall prevention strategies   Time 4   Period Weeks   Status New     PT SHORT TERM GOAL #3   Title report a 25% reduction in LBP with yardwork and home activity   Time 4   Period Weeks   Status New     PT SHORT TERM GOAL #4   Title perform 5x sit to stand in < or = to 14 seconds to reduce falls risk   Time 4   Period Weeks   Status New           PT Long Term Goals - 08/31/16 1156      PT LONG TERM GOAL #1   Title be independent in advanced HEP   Time 8   Period Weeks   Status New     PT LONG TERM GOAL #2   Title demonstrate 4+/5 LE strength throughout to improve stability, endurance and reduce falls risk   Time 8   Period Weeks   Status New     PT LONG TERM GOAL #3   Title report a 50% reduction in LBP with yardwork and home tasks   Time 8   Period Weeks   Status New     PT LONG TERM GOAL #4  Title perform 5x sit to stand in < or = to 12 seconds to reduce falls risk   Time 8   Period Weeks   Status New               Plan - 08/31/16 1148    Clinical Impression Statement Pt presents to PT with  complaints of chronic LBP and Rt LE weakness.  Pt reports intermittent loss of balance due to Rt LE weakness.  Pt demonstrates Rt LE weakness, reports LBP with activity, 5x sit to stand is 16 seconds and tension in bil lumbar paraspinals.  Pt is a moderate complexity evaluation due to evolving condition, addressing 3 elements and comorbidities including previous back surgery and osteopenia that will impact care. Pt will benefit from skilled PT for LE strength, balance training, core strength and hip/core flexibility to reduce pain and improve safety.    Rehab Potential Good   PT Frequency 2x / week   PT Duration 8 weeks   PT Treatment/Interventions Patient/family education;ADLs/Self Care Home Management;Neuromuscular re-education;Cryotherapy;Electrical Stimulation;Functional mobility training;Stair training;Gait training;Ultrasound;Moist Heat;Therapeutic activities;Therapeutic exercise;Passive range of motion;Manual techniques;Dry needling;Vasopneumatic Device   PT Next Visit Plan Body mechanics education, LE strength, balance, core strength, modalities and manual as needed.   Consulted and Agree with Plan of Care Patient      Patient will benefit from skilled therapeutic intervention in order to improve the following deficits and impairments:  Decreased range of motion, Pain, Postural dysfunction, Decreased strength, Impaired flexibility, Improper body mechanics, Decreased activity tolerance, Decreased endurance  Visit Diagnosis: Muscle weakness (generalized) - Plan: PT plan of care cert/re-cert  Other abnormalities of gait and mobility - Plan: PT plan of care cert/re-cert  Chronic bilateral low back pain with right-sided sciatica - Plan: PT plan of care cert/re-cert      G-Codes - 98/33/82 1107    Functional Assessment Tool Used (Outpatient Only) FOTO: 39% limitation   Functional Limitation Other PT primary   Changing and Maintaining Body Position Current Status (N0539) --   Changing and  Maintaining Body Position Goal Status (J6734) --   Other PT Primary Current Status (L9379) At least 20 percent but less than 40 percent impaired, limited or restricted   Other PT Primary Goal Status (K2409) At least 20 percent but less than 40 percent impaired, limited or restricted       Problem List Patient Active Problem List   Diagnosis Date Noted  . Numbness 07/02/2015  . Varicose veins of lower extremities with other complications 73/53/2992  . Cough, persistent 01/22/2012     Sigurd Sos, PT 08/31/16 12:03 PM   [facility] [address] [place], [place], [address] Phone: [phone]   Fax:  [phone]  Name: Tammy Lindsey MRN: [id number] Date of Birth: March 18, 1932

## 2016-09-06 ENCOUNTER — Other Ambulatory Visit: Payer: Self-pay | Admitting: Obstetrics and Gynecology

## 2016-09-06 DIAGNOSIS — N812 Incomplete uterovaginal prolapse: Secondary | ICD-10-CM | POA: Diagnosis not present

## 2016-09-06 DIAGNOSIS — N644 Mastodynia: Secondary | ICD-10-CM | POA: Diagnosis not present

## 2016-09-06 DIAGNOSIS — B373 Candidiasis of vulva and vagina: Secondary | ICD-10-CM | POA: Diagnosis not present

## 2016-09-07 ENCOUNTER — Ambulatory Visit: Payer: PPO

## 2016-09-07 DIAGNOSIS — M5441 Lumbago with sciatica, right side: Secondary | ICD-10-CM

## 2016-09-07 DIAGNOSIS — M6281 Muscle weakness (generalized): Secondary | ICD-10-CM

## 2016-09-07 DIAGNOSIS — R2689 Other abnormalities of gait and mobility: Secondary | ICD-10-CM

## 2016-09-07 DIAGNOSIS — G8929 Other chronic pain: Secondary | ICD-10-CM

## 2016-09-07 NOTE — Patient Instructions (Addendum)

## 2016-09-07 NOTE — Therapy (Signed)
[facility] [address] [place], [place], [address] Phone: [phone]   Fax:  [phone]  Physical Therapy Treatment  Patient Details  Name: Tammy Lindsey MRN: [id number] Date of Birth: 03-06-1932 Referring Provider: Sarina Ill, MD  Encounter Date: 09/07/2016      PT End of Session - 09/07/16 1222    Visit Number 2   Number of Visits 10   Date for PT Re-Evaluation 10/26/16   PT Start Time 3267   PT Stop Time 1228   PT Time Calculation (min) 43 min   Activity Tolerance Patient tolerated treatment well   Behavior During Therapy [facility] for tasks assessed/performed      Past Medical History:  Diagnosis Date  . Depression   . Diverticulosis   . Esophageal reflux   . Hiatal hernia   . Hx of colonic polyps   . Osteoarthritis   . Osteopenia    bone density 1-09 and 2-13  . Pulmonary nodules    scattered-CT Scan July 2010, 12-10, and 04-2010-all stable felt benign     Past Surgical History:  Procedure Laterality Date  . CHOLECYSTECTOMY  1974   Gall Bladder  . JOINT REPLACEMENT Left May 2012  . JOINT REPLACEMENT Left 2012   Reverse   . orthroscopic  Rotator Cuff Left 2011  . Partial shoulder Left 2005  . ROTATOR CUFF REPAIR     x 2 1998 and 1999  . SPINE SURGERY  1985   disk  . TONSILLECTOMY  1952    There were no vitals filed for this visit.      Subjective Assessment - 09/07/16 1155    Subjective I have been doing my exercises.  Maybe not 3x/day.     Currently in Pain? Yes   Pain Location Back   Pain Orientation Right;Left;Lower   Pain Descriptors / Indicators Nagging   Pain Type Chronic pain   Pain Onset More than a month ago   Pain Frequency Intermittent   Aggravating Factors  when stiff from lying down, being active   Pain Relieving Factors not being as active, ice, heat, Tylenol                         [facility] Adult PT Treatment/Exercise - 09/07/16 0001      Exercises   Exercises Knee/Hip;Lumbar     Lumbar Exercises: Stretches   Active Hamstring Stretch 3 reps;20 seconds   Single Knee to Chest Stretch 3 reps;20 seconds     Knee/Hip Exercises: Aerobic   Nustep Level 1x 10 minutes  PT presents to discuss progress     Knee/Hip Exercises: Standing   Heel Raises Both;2 sets;10 reps   Hip Abduction Stengthening;Both;2 sets;10 reps   Hip Extension Stengthening;Both;2 sets;10 reps   Rocker Board 3 minutes   Rebounder weight shifting 3 ways x 1 minute each     Knee/Hip Exercises: Seated   Long Arc Quad Strengthening;Both;2 sets;10 reps     Knee/Hip Exercises: Supine   Straight Leg Raises Strengthening;Both;2 sets;10 reps                PT Education - 09/07/16 1209    Education provided Yes   Education Details fall prevention education   Person(s) Educated Patient   Methods Explanation;Demonstration;Handout   Comprehension Verbalized understanding;Returned demonstration          PT Short Term Goals - 09/07/16 1204      PT SHORT  TERM GOAL #1   Title be independent in initial HEP   Time 4   Period Weeks   Status On-going     PT SHORT TERM GOAL #2   Title verbalize understanding of fall prevention strategies   Status Achieved     PT SHORT TERM GOAL #3   Title report a 25% reduction in LBP with yardwork and home activity   Time 4   Period Weeks   Status On-going           PT Long Term Goals - 08/31/16 1156      PT LONG TERM GOAL #1   Title be independent in advanced HEP   Time 8   Period Weeks   Status New     PT LONG TERM GOAL #2   Title demonstrate 4+/5 LE strength throughout to improve stability, endurance and reduce falls risk   Time 8   Period Weeks   Status New     PT LONG TERM GOAL #3   Title report a 50% reduction in LBP with yardwork and home tasks   Time 8   Period Weeks   Status New     PT LONG TERM GOAL #4   Title perform 5x sit to stand in < or = to 12 seconds to reduce falls risk    Time 8   Period Weeks   Status New               Plan - 09/07/16 1205    Clinical Impression Statement Pt with only 1 session after evaluation.  Pt is independent in initial HEP with moderate compliance at home.  Pt has received fall prevention education to reduce risk of falls.  Pt with continued balance deficits and LE weaknss and LBP with activity and will benefit from skilled PT for strength, balance and endurance.     Rehab Potential Good   PT Frequency 2x / week   PT Duration 8 weeks   PT Treatment/Interventions Patient/family education;ADLs/Self Care Home Management;Neuromuscular re-education;Cryotherapy;Electrical Stimulation;Functional mobility training;Stair training;Gait training;Ultrasound;Moist Heat;Therapeutic activities;Therapeutic exercise;Passive range of motion;Manual techniques;Dry needling;Vasopneumatic Device   PT Next Visit Plan Body mechanics education, LE strength, balance, core strength, modalities and manual as needed.      Patient will benefit from skilled therapeutic intervention in order to improve the following deficits and impairments:  Decreased range of motion, Pain, Postural dysfunction, Decreased strength, Impaired flexibility, Improper body mechanics, Decreased activity tolerance, Decreased endurance  Visit Diagnosis: Muscle weakness (generalized)  Other abnormalities of gait and mobility  Chronic bilateral low back pain with right-sided sciatica     Problem List Patient Active Problem List   Diagnosis Date Noted  . Numbness 07/02/2015  . Varicose veins of lower extremities with other complications 48/25/0037  . Cough, persistent 01/22/2012    Sigurd Sos, PT 09/07/16 12:26 PM  [facility] [address], [place], [place], [address] Phone: [phone]   Fax:  [phone]  Name: Tammy Lindsey MRN: [id number] Date of Birth: 1932-04-18

## 2016-09-08 ENCOUNTER — Other Ambulatory Visit: Payer: Self-pay

## 2016-09-08 DIAGNOSIS — Z803 Family history of malignant neoplasm of breast: Secondary | ICD-10-CM | POA: Diagnosis not present

## 2016-09-08 DIAGNOSIS — N644 Mastodynia: Secondary | ICD-10-CM | POA: Diagnosis not present

## 2016-09-08 DIAGNOSIS — R922 Inconclusive mammogram: Secondary | ICD-10-CM | POA: Diagnosis not present

## 2016-09-14 ENCOUNTER — Encounter: Payer: Self-pay | Admitting: Physical Therapy

## 2016-09-14 ENCOUNTER — Ambulatory Visit: Payer: PPO | Admitting: Physical Therapy

## 2016-09-14 DIAGNOSIS — M6281 Muscle weakness (generalized): Secondary | ICD-10-CM

## 2016-09-14 DIAGNOSIS — G8929 Other chronic pain: Secondary | ICD-10-CM

## 2016-09-14 DIAGNOSIS — R2689 Other abnormalities of gait and mobility: Secondary | ICD-10-CM

## 2016-09-14 DIAGNOSIS — R42 Dizziness and giddiness: Secondary | ICD-10-CM

## 2016-09-14 DIAGNOSIS — M5441 Lumbago with sciatica, right side: Secondary | ICD-10-CM

## 2016-09-14 NOTE — Therapy (Signed)
[facility] [address] [place], [place], [address] Phone: [phone]   Fax:  [phone]  Physical Therapy Treatment  Patient Details  Name: NICKOLETTE ESPINOLA MRN: [id number] Date of Birth: June 20, 1931 Referring Provider: Sarina Ill, MD  Encounter Date: 09/14/2016      PT End of Session - 09/14/16 1143    Visit Number 3   Number of Visits 10   Date for PT Re-Evaluation 10/26/16   PT Start Time 0350   PT Stop Time 1222   PT Time Calculation (min) 40 min   Activity Tolerance Patient tolerated treatment well   Behavior During Therapy [facility] for tasks assessed/performed      Past Medical History:  Diagnosis Date  . Depression   . Diverticulosis   . Esophageal reflux   . Hiatal hernia   . Hx of colonic polyps   . Osteoarthritis   . Osteopenia    bone density 1-09 and 2-13  . Pulmonary nodules    scattered-CT Scan July 2010, 12-10, and 04-2010-all stable felt benign     Past Surgical History:  Procedure Laterality Date  . CHOLECYSTECTOMY  1974   Gall Bladder  . JOINT REPLACEMENT Left May 2012  . JOINT REPLACEMENT Left 2012   Reverse   . orthroscopic  Rotator Cuff Left 2011  . Partial shoulder Left 2005  . ROTATOR CUFF REPAIR     x 2 1998 and 1999  . SPINE SURGERY  1985   disk  . TONSILLECTOMY  1952    There were no vitals filed for this visit.      Subjective Assessment - 09/14/16 1143    Subjective My shoulders are a little sore and my low back always kind of rumbles. I already worked out this morning with weights at [employer]. We worked on our thighs a lot today.    Currently in Pain? Yes   Pain Score 4    Pain Location Back   Pain Orientation Right;Left;Lower   Pain Descriptors / Indicators Nagging   Pain Type Chronic pain   Pain Onset More than a month ago   Pain Frequency Intermittent                         [facility] Adult PT Treatment/Exercise - 09/14/16 0001      Lumbar Exercises: Standing   Row --   Shoulder Extension --     Knee/Hip Exercises: Aerobic   Nustep Level 2x 10 minutes  PT presents to discuss progress     Knee/Hip Exercises: Standing   Hip Abduction Stengthening;Both;2 sets;10 reps   Hip Extension Stengthening;Both;2 sets;10 reps   Forward Step Up Both;10 reps   Rebounder 4 direction  #25 front, #20 back, #15 side     Knee/Hip Exercises: Seated   Ball Squeeze x20  with good posture   Sit to [employer] 10 reps  With discussion of proper body mechanics     Manual Therapy   Manual Therapy Soft tissue mobilization   Manual therapy comments Patient prone   Soft tissue mobilization Lower thoracic and upper lumbar paraspinals                  PT Short Term Goals - 09/14/16 1144      PT SHORT TERM GOAL #1   Title be independent in initial HEP   Time 4   Period Weeks   Status Achieved     PT SHORT  TERM GOAL #2   Title verbalize understanding of fall prevention strategies   Status Achieved     PT SHORT TERM GOAL #3   Title report a 25% reduction in LBP with yardwork and home activity   Time 4   Period Weeks   Status On-going     PT SHORT TERM GOAL #4   Title perform 5x sit to stand in < or = to 14 seconds to reduce falls risk   Time 4   Period Weeks   Status On-going           PT Long Term Goals - 08/31/16 1156      PT LONG TERM GOAL #1   Title be independent in advanced HEP   Time 8   Period Weeks   Status New     PT LONG TERM GOAL #2   Title demonstrate 4+/5 LE strength throughout to improve stability, endurance and reduce falls risk   Time 8   Period Weeks   Status New     PT LONG TERM GOAL #3   Title report a 50% reduction in LBP with yardwork and home tasks   Time 8   Period Weeks   Status New     PT LONG TERM GOAL #4   Title perform 5x sit to stand in < or = to 12 seconds to reduce falls risk   Time [place] - 09/14/16 1244     Clinical Impression Statement Patient did well with all strengthening. Does very well with balance and strengthening. Dicussed proper body mechanics and patient communicated understanding. Patient with tenderness in mid back, lower thoracic area. Responded well with manual soft tissue mobilization. Patient will continue to benefit from skilled therpy for deep core strengthening and management of pain symptoms.    Rehab Potential Good   PT Frequency 2x / week   PT Duration 8 weeks   PT Treatment/Interventions Patient/family education;ADLs/Self Care Home Management;Neuromuscular re-education;Cryotherapy;Electrical Stimulation;Functional mobility training;Stair training;Gait training;Ultrasound;Moist Heat;Therapeutic activities;Therapeutic exercise;Passive range of motion;Manual techniques;Dry needling;Vasopneumatic Device   PT Next Visit Plan LE strength, balance, core strength, modalities and manual as needed.   Consulted and Agree with Plan of Care Patient      Patient will benefit from skilled therapeutic intervention in order to improve the following deficits and impairments:  Decreased range of motion, Pain, Postural dysfunction, Decreased strength, Impaired flexibility, Improper body mechanics, Decreased activity tolerance, Decreased endurance  Visit Diagnosis: Muscle weakness (generalized)  Other abnormalities of gait and mobility  Chronic bilateral low back pain with right-sided sciatica  Dizziness and giddiness     Problem List Patient Active Problem List   Diagnosis Date Noted  . Numbness 07/02/2015  . Varicose veins of lower extremities with other complications 23/53/6144  . Cough, persistent 01/22/2012    Jeanie Sewer PTA 09/14/2016, 12:59 PM  [facility] [address], [place], [place], [address] Phone: [phone]   Fax:  [phone]  Name: JAZYAH BUTSCH MRN: [id number] Date of Birth: 1932-02-19

## 2016-09-21 ENCOUNTER — Ambulatory Visit: Payer: PPO | Admitting: Physical Therapy

## 2016-09-21 ENCOUNTER — Encounter: Payer: Self-pay | Admitting: Physical Therapy

## 2016-09-21 DIAGNOSIS — G8929 Other chronic pain: Secondary | ICD-10-CM

## 2016-09-21 DIAGNOSIS — R42 Dizziness and giddiness: Secondary | ICD-10-CM

## 2016-09-21 DIAGNOSIS — M6281 Muscle weakness (generalized): Secondary | ICD-10-CM | POA: Diagnosis not present

## 2016-09-21 DIAGNOSIS — M5441 Lumbago with sciatica, right side: Secondary | ICD-10-CM

## 2016-09-21 DIAGNOSIS — R2689 Other abnormalities of gait and mobility: Secondary | ICD-10-CM

## 2016-09-21 NOTE — Therapy (Signed)
[facility] [address] [place], [place], [address] Phone: [phone]   Fax:  [phone]  Physical Therapy Treatment  Patient Details  Name: Tammy Lindsey MRN: [id number] Date of Birth: 06-24-1931 Referring Provider: Sarina Ill, MD  Encounter Date: 09/21/2016      PT End of Session - 09/21/16 1147    Visit Number 4   Number of Visits 10   Date for PT Re-Evaluation 10/26/16   PT Start Time 1143   PT Stop Time 1225   PT Time Calculation (min) 42 min   Activity Tolerance Patient tolerated treatment well   Behavior During Therapy [facility] for tasks assessed/performed      Past Medical History:  Diagnosis Date  . Depression   . Diverticulosis   . Esophageal reflux   . Hiatal hernia   . Hx of colonic polyps   . Osteoarthritis   . Osteopenia    bone density 1-09 and 2-13  . Pulmonary nodules    scattered-CT Scan July 2010, 12-10, and 04-2010-all stable felt benign     Past Surgical History:  Procedure Laterality Date  . CHOLECYSTECTOMY  1974   Gall Bladder  . JOINT REPLACEMENT Left May 2012  . JOINT REPLACEMENT Left 2012   Reverse   . orthroscopic  Rotator Cuff Left 2011  . Partial shoulder Left 2005  . ROTATOR CUFF REPAIR     x 2 1998 and 1999  . SPINE SURGERY  1985   disk  . TONSILLECTOMY  1952    There were no vitals filed for this visit.      Subjective Assessment - 09/21/16 1146    Subjective Patient reports back feeling ok today. Already done exercises this moring so I've already loosened up some.    Currently in Pain? No/denies   Pain Score 0-No pain                         [facility] Adult PT Treatment/Exercise - 09/21/16 0001      Neuro Re-ed    Neuro Re-ed Details  Activation TA, RA, glutes  Proprioception and balance with varied BOS     Lumbar Exercises: Stretches   ITB Stretch 2 reps   Piriformis Stretch 2 reps     Lumbar Exercises: Machines for Strengthening   Leg Press Bil #45 2x10  Seat 6     Lumbar Exercises: Supine   Ab Set 10 reps   Glut Set 10 reps  With ball squeeze   Clam 10 reps  without hip rotation   Bent Knee Raise 10 reps   Bridge 10 reps   Large Ball Abdominal Isometric 10 reps  x2o knees to chest with red ball     Knee/Hip Exercises: Aerobic   Nustep Level 3x 6 minutes  PT presents to discuss progress     Knee/Hip Exercises: Standing   Rebounder Nar[place] BOS eyes open and closed; tendem stance x 1 min each; SLS x 30 seconds   Walking with Sports Cord Front and back #25  x6 each                  PT Short Term Goals - 09/21/16 1147      PT SHORT TERM GOAL #3   Title report a 25% reduction in LBP with yardwork and home activity   Time 4   Period Weeks   Status On-going     PT SHORT TERM GOAL #  4   Title perform 5x sit to stand in < or = to 14 seconds to reduce falls risk   Time 4   Period Weeks   Status On-going           PT Long Term Goals - 08/31/16 1156      PT LONG TERM GOAL #1   Title be independent in advanced HEP   Time 8   Period Weeks   Status New     PT LONG TERM GOAL #2   Title demonstrate 4+/5 LE strength throughout to improve stability, endurance and reduce falls risk   Time 8   Period Weeks   Status New     PT LONG TERM GOAL #3   Title report a 50% reduction in LBP with yardwork and home tasks   Time 8   Period Weeks   Status New     PT LONG TERM GOAL #4   Title perform 5x sit to stand in < or = to 12 seconds to reduce falls risk   Time 8   Period Weeks   Status New               Plan - 09/21/16 1222    Clinical Impression Statement Patient did well with all strengthening and balance. Did well with deep core exercises in supine and seated. Patient will continue to benefit from skilled thearpy for strengthening and endurance as well as deep core stabilization.    Rehab Potential Good   PT Frequency 2x / week   PT Duration 8 weeks   PT Treatment/Interventions  Patient/family education;ADLs/Self Care Home Management;Neuromuscular re-education;Cryotherapy;Electrical Stimulation;Functional mobility training;Stair training;Gait training;Ultrasound;Moist Heat;Therapeutic activities;Therapeutic exercise;Passive range of motion;Manual techniques;Dry needling;Vasopneumatic Device   PT Next Visit Plan LE strength, balance, core strength, modalities and manual as needed.   Consulted and Agree with Plan of Care Patient      Patient will benefit from skilled therapeutic intervention in order to improve the following deficits and impairments:  Decreased range of motion, Pain, Postural dysfunction, Decreased strength, Impaired flexibility, Improper body mechanics, Decreased activity tolerance, Decreased endurance  Visit Diagnosis: Muscle weakness (generalized)  Other abnormalities of gait and mobility  Chronic bilateral low back pain with right-sided sciatica  Dizziness and giddiness     Problem List Patient Active Problem List   Diagnosis Date Noted  . Numbness 07/02/2015  . Varicose veins of lower extremities with other complications 23/53/6144  . Cough, persistent 01/22/2012    Jeanie Sewer PTA 09/21/2016, 3:08 PM  [facility] [address] [place], [place], [address] Phone: [phone]   Fax:  [phone]  Name: Tammy Lindsey MRN: [id number] Date of Birth: 1931/09/04

## 2016-09-28 ENCOUNTER — Ambulatory Visit: Payer: PPO | Attending: Neurology | Admitting: Physical Therapy

## 2016-09-28 ENCOUNTER — Encounter: Payer: Self-pay | Admitting: Physical Therapy

## 2016-09-28 DIAGNOSIS — M6281 Muscle weakness (generalized): Secondary | ICD-10-CM

## 2016-09-28 DIAGNOSIS — R2689 Other abnormalities of gait and mobility: Secondary | ICD-10-CM | POA: Diagnosis not present

## 2016-09-28 DIAGNOSIS — G8929 Other chronic pain: Secondary | ICD-10-CM | POA: Diagnosis not present

## 2016-09-28 DIAGNOSIS — M5441 Lumbago with sciatica, right side: Secondary | ICD-10-CM | POA: Diagnosis not present

## 2016-09-28 NOTE — Therapy (Signed)
[facility] [address] [place], [place], [address] Phone: [phone]   Fax:  [phone]  Physical Therapy Treatment  Patient Details  Name: Tammy Lindsey MRN: [id number] Date of Birth: 02-08-1932 Referring Provider: Sarina Ill, MD  Encounter Date: 09/28/2016      PT End of Session - 09/28/16 1219    Visit Number 5   Number of Visits 10   Date for PT Re-Evaluation 10/26/16   PT Start Time 1140   PT Stop Time 1225   PT Time Calculation (min) 45 min   Activity Tolerance Patient tolerated treatment well   Behavior During Therapy [facility] for tasks assessed/performed      Past Medical History:  Diagnosis Date  . Depression   . Diverticulosis   . Esophageal reflux   . Hiatal hernia   . Hx of colonic polyps   . Osteoarthritis   . Osteopenia    bone density 1-09 and 2-13  . Pulmonary nodules    scattered-CT Scan July 2010, 12-10, and 04-2010-all stable felt benign     Past Surgical History:  Procedure Laterality Date  . CHOLECYSTECTOMY  1974   Gall Bladder  . JOINT REPLACEMENT Left May 2012  . JOINT REPLACEMENT Left 2012   Reverse   . orthroscopic  Rotator Cuff Left 2011  . Partial shoulder Left 2005  . ROTATOR CUFF REPAIR     x 2 1998 and 1999  . SPINE SURGERY  1985   disk  . TONSILLECTOMY  1952    There were no vitals filed for this visit.      Subjective Assessment - 09/28/16 1142    Subjective Pt reports she is feeling ok.  exercises have been going well at home   Currently in Pain? Yes   Pain Score 2    Pain Location Back   Pain Orientation Mid;Lower   Pain Descriptors / Indicators Nagging   Pain Type Chronic pain   Pain Onset More than a month ago                         [facility] Adult PT Treatment/Exercise - 09/28/16 0001      Lumbar Exercises: Stretches   Single Knee to Chest Stretch 3 reps;20 seconds   Piriformis Stretch 2 reps;30 seconds     Lumbar Exercises:  Machines for Strengthening   Leg Press bilat 50# x 10, 55# x 10  seat 6     Lumbar Exercises: Supine   Ab Set 10 reps   Clam 10 reps  bilat, without hip rotation   Bent Knee Raise 10 reps  cues for not holding breath   Bridge 10 reps  + 10 with ball squeeze   Large Ball Abdominal Isometric 20 reps  knees to chest with red theraball     Knee/Hip Exercises: Aerobic   Nustep level 4 x 6 mins  PT present to discuss progress     Knee/Hip Exercises: Standing   Rebounder Narrow BOS, eyes closed, tandem SLS   Walking with Sports Cord front/back/sideways x 6 each 50#                  PT Short Term Goals - 09/28/16 1220      PT SHORT TERM GOAL #1   Title be independent in initial HEP   Status Achieved     PT SHORT TERM GOAL #2   Title verbalize understanding of fall prevention  strategies   Status Achieved     PT SHORT TERM GOAL #3   Title report a 25% reduction in LBP with yardwork and home activity   Status On-going     PT SHORT TERM GOAL #4   Title perform 5x sit to stand in < or = to 14 seconds to reduce falls risk   Status On-going           PT Long Term Goals - 09/28/16 1221      PT LONG TERM GOAL #1   Title be independent in advanced HEP   Status On-going     PT LONG TERM GOAL #2   Title demonstrate 4+/5 LE strength throughout to improve stability, endurance and reduce falls risk   Status On-going     PT LONG TERM GOAL #3   Title report a 50% reduction in LBP with yardwork and home tasks   Status On-going     PT LONG TERM GOAL #4   Title perform 5x sit to stand in < or = to 12 seconds to reduce falls risk   Status On-going               Plan - 09/28/16 1220    Clinical Impression Statement Pt tolerated well and was able to increase weight on leg press and resisted walking.  continues to need assistance with deep core strengthening   Rehab Potential Good   PT Frequency 2x / week   PT Duration 8 weeks   PT Treatment/Interventions  Patient/family education;ADLs/Self Care Home Management;Neuromuscular re-education;Cryotherapy;Electrical Stimulation;Functional mobility training;Stair training;Gait training;Ultrasound;Moist Heat;Therapeutic activities;Therapeutic exercise;Passive range of motion;Manual techniques;Dry needling;Vasopneumatic Device   PT Next Visit Plan LE strength, balance, core strength      Patient will benefit from skilled therapeutic intervention in order to improve the following deficits and impairments:  Decreased range of motion, Pain, Postural dysfunction, Decreased strength, Impaired flexibility, Improper body mechanics, Decreased activity tolerance, Decreased endurance  Visit Diagnosis: Muscle weakness (generalized)  Other abnormalities of gait and mobility  Chronic bilateral low back pain with right-sided sciatica     Problem List Patient Active Problem List   Diagnosis Date Noted  . Numbness 07/02/2015  . Varicose veins of lower extremities with other complications 32/54/9826  . Cough, persistent 01/22/2012    Isabelle Course, PT, DPT 09/28/2016, 12:23 PM  [facility] [address], [place], [place], [address] Phone: [phone]   Fax:  [phone]  Name: Tammy Lindsey MRN: [id number] Date of Birth: [birth year]

## 2016-10-05 ENCOUNTER — Ambulatory Visit: Payer: PPO

## 2016-10-05 DIAGNOSIS — R2689 Other abnormalities of gait and mobility: Secondary | ICD-10-CM

## 2016-10-05 DIAGNOSIS — M6281 Muscle weakness (generalized): Secondary | ICD-10-CM

## 2016-10-05 NOTE — Patient Instructions (Addendum)
Knee High   Holding stable object, raise knee to hip level, then lower knee. Repeat with other knee. Complete __10_ repetitions. Do __2__ sessions per day.  ABDUCTION: Standing (Active)   Stand, feet flat. Lift right leg out to side. Use _0__ lbs. Complete __10_ repetitions. Perform __2_ sessions per day.     EXTENSION: Standing (Active)  Stand, both feet flat. Draw right leg behind body as far as possible. Use 0___ lbs. Complete 10 repetitions. Perform __2_ sessions per day.  Copyright  VHI. All rights reserved.   Heel Raises    Stand with support.  Repeat _2x10 __ times. Do 1-2___ times a day.  Copyright  VHI. All rights reserved.  [place] [address] [place], [place] [address] Phone # [phone] Fax [phone]

## 2016-10-05 NOTE — Therapy (Signed)
[facility] [address], [place], [place], [address] Phone: [phone]   Fax:  [phone]  Physical Therapy Treatment  Patient Details  Name: BILLIJO DILLING MRN: [id number] Date of Birth: 1931/05/06 Referring Provider: Sarina Ill, MD  Encounter Date: 10/05/2016      PT End of Session - 10/05/16 1210    Visit Number 6   PT Start Time 7322   PT Stop Time 1212   PT Time Calculation (min) 31 min   Activity Tolerance Patient tolerated treatment well   Behavior During Therapy [facility] for tasks assessed/performed      Past Medical History:  Diagnosis Date  . Depression   . Diverticulosis   . Esophageal reflux   . Hiatal hernia   . Hx of colonic polyps   . Osteoarthritis   . Osteopenia    bone density 1-09 and 2-13  . Pulmonary nodules    scattered-CT Scan July 2010, 12-10, and 04-2010-all stable felt benign     Past Surgical History:  Procedure Laterality Date  . CHOLECYSTECTOMY  1974   Gall Bladder  . JOINT REPLACEMENT Left May 2012  . JOINT REPLACEMENT Left 2012   Reverse   . orthroscopic  Rotator Cuff Left 2011  . Partial shoulder Left 2005  . ROTATOR CUFF REPAIR     x 2 1998 and 1999  . SPINE SURGERY  1985   disk  . TONSILLECTOMY  1952    There were no vitals filed for this visit.      Subjective Assessment - 10/05/16 1145    Subjective Ready for D/C to HEP.  My Rt ankle hasn't collapsed in a long time.     Currently in Pain? Yes   Pain Score 4    Pain Location Back   Pain Orientation Right;Left;Lower   Pain Descriptors / Indicators Nagging   Pain Type Chronic pain   Pain Onset More than a month ago   Pain Frequency Intermittent   Aggravating Factors  standing a long time, at night with lying down   Pain Relieving Factors not being as active, heat/ice, Tylenol            [facility] PT Assessment - 10/05/16 0001      Assessment   Medical Diagnosis lumbosacral radiuclopathy at L5,  weakness of Rt foot     Cognition   Overall Cognitive Status Within Functional Limits for tasks assessed     Observation/Other Assessments   Focus on Therapeutic Outcomes (FOTO)  28% limitation     Strength   Right Hip Flexion 4/5   Left Hip Flexion 4+/5   Right Knee Flexion 4+/5   Right Knee Extension 4+/5   Left Knee Flexion 4+/5   Left Knee Extension 4+/5   Right Ankle Dorsiflexion 4/5   Left Ankle Dorsiflexion 4+/5     Transfers   Transfers Sit to Stand   Sit to Stand Without upper extremity assist   Five time sit to stand comments  10 seconds                     [facility] Adult PT Treatment/Exercise - 10/05/16 0001      Lumbar Exercises: Standing   Heel Raises 20 reps   Other Standing Lumbar Exercises standing hip abduction, extension and marching 2x10 each with abdominal bracing     Knee/Hip Exercises: Aerobic   Nustep level 4 x 10 mins  PT present to discuss progress  PT Education - 29-Oct-2016 1152    Education provided Yes   Education Details standing hip exercises, heel raises   Person(s) Educated Patient   Methods Explanation;Demonstration;Handout   Comprehension Verbalized understanding;Returned demonstration          PT Short Term Goals - 10/29/2016 1144      PT SHORT TERM GOAL #3   Title report a 25% reduction in LBP with yardwork and home activity   Status Not Met     PT SHORT TERM GOAL #4   Title perform 5x sit to stand in < or = to 14 seconds to reduce falls risk   Status Achieved           PT Long Term Goals - Oct 29, 2016 1145      PT LONG TERM GOAL #1   Title be independent in advanced HEP   Status Achieved     PT LONG TERM GOAL #2   Title demonstrate 4+/5 LE strength throughout to improve stability, endurance and reduce falls risk   Status Partially Met     PT LONG TERM GOAL #3   Title report a 50% reduction in LBP with yardwork and home tasks   Baseline no significant change due to chronic condition    Status Not Met     PT LONG TERM GOAL #4   Title perform 5x sit to stand in < or = to 12 seconds to reduce falls risk   Status Achieved               Plan - 10/29/16 1204    Clinical Impression Statement Pt requested D/C to HEP.  Pt with improved 5x sit to stand indicating reduced falls risk.  FOTO is improved to 28% limitation.  Pt reports that her Rt ankle has felt more stable. Pt will D/C to HEP.     PT Next Visit Plan D/C PT to HEP   Recommended Other Services initial certification is signed   Consulted and Agree with Plan of Care Patient      Patient will benefit from skilled therapeutic intervention in order to improve the following deficits and impairments:     Visit Diagnosis: Muscle weakness (generalized)  Other abnormalities of gait and mobility       G-Codes - 2016/10/29 1203    Functional Assessment Tool Used (Outpatient Only) FOTO: 28% limitation, 5x sit to stand   Functional Limitation Other PT primary   Other PT Primary Goal Status (S3419) At least 20 percent but less than 40 percent impaired, limited or restricted   Other PT Primary Discharge Status (Q2229) At least 20 percent but less than 40 percent impaired, limited or restricted      Problem List Patient Active Problem List   Diagnosis Date Noted  . Numbness 07/02/2015  . Varicose veins of lower extremities with other complications 79/89/2119  . Cough, persistent 01/22/2012  PHYSICAL THERAPY DISCHARGE SUMMARY  Visits from Start of Care: 6  Current functional level related to goals / functional outcomes: See above for current status.     Remaining deficits: Chronic LBP.  Pt has HEP in place to address LBP and balance/LE strength.     Education / Equipment: HEP Plan: Patient agrees to discharge.  Patient goals were partially met. Patient is being discharged due to being pleased with the current functional level.  ?????         Sigurd Sos, PT 10-29-16 12:18 PM   [facility] [address] [place], [place], [address] Phone: [phone]   Fax:  [phone]  Name: RANDALYN AHMED MRN: [id number] Date of Birth: October 02, 1931

## 2016-10-06 DIAGNOSIS — H02051 Trichiasis without entropian right upper eyelid: Secondary | ICD-10-CM | POA: Diagnosis not present

## 2016-10-07 DIAGNOSIS — L814 Other melanin hyperpigmentation: Secondary | ICD-10-CM | POA: Diagnosis not present

## 2016-10-07 DIAGNOSIS — L700 Acne vulgaris: Secondary | ICD-10-CM | POA: Diagnosis not present

## 2016-12-07 DIAGNOSIS — M5136 Other intervertebral disc degeneration, lumbar region: Secondary | ICD-10-CM | POA: Diagnosis not present

## 2017-01-16 DIAGNOSIS — B880 Other acariasis: Secondary | ICD-10-CM | POA: Diagnosis not present

## 2017-01-16 DIAGNOSIS — R42 Dizziness and giddiness: Secondary | ICD-10-CM | POA: Diagnosis not present

## 2017-01-16 DIAGNOSIS — Z23 Encounter for immunization: Secondary | ICD-10-CM | POA: Diagnosis not present

## 2017-01-18 DIAGNOSIS — D2271 Melanocytic nevi of right lower limb, including hip: Secondary | ICD-10-CM | POA: Diagnosis not present

## 2017-01-18 DIAGNOSIS — D2262 Melanocytic nevi of left upper limb, including shoulder: Secondary | ICD-10-CM | POA: Diagnosis not present

## 2017-01-18 DIAGNOSIS — D1801 Hemangioma of skin and subcutaneous tissue: Secondary | ICD-10-CM | POA: Diagnosis not present

## 2017-01-18 DIAGNOSIS — L814 Other melanin hyperpigmentation: Secondary | ICD-10-CM | POA: Diagnosis not present

## 2017-01-18 DIAGNOSIS — D2272 Melanocytic nevi of left lower limb, including hip: Secondary | ICD-10-CM | POA: Diagnosis not present

## 2017-01-18 DIAGNOSIS — L821 Other seborrheic keratosis: Secondary | ICD-10-CM | POA: Diagnosis not present

## 2017-01-18 DIAGNOSIS — D225 Melanocytic nevi of trunk: Secondary | ICD-10-CM | POA: Diagnosis not present

## 2017-03-08 DIAGNOSIS — Z961 Presence of intraocular lens: Secondary | ICD-10-CM | POA: Diagnosis not present

## 2017-03-08 DIAGNOSIS — H5213 Myopia, bilateral: Secondary | ICD-10-CM | POA: Diagnosis not present

## 2017-03-09 DIAGNOSIS — M5136 Other intervertebral disc degeneration, lumbar region: Secondary | ICD-10-CM | POA: Diagnosis not present

## 2017-05-11 DIAGNOSIS — A084 Viral intestinal infection, unspecified: Secondary | ICD-10-CM | POA: Diagnosis not present

## 2017-06-02 DIAGNOSIS — M5136 Other intervertebral disc degeneration, lumbar region: Secondary | ICD-10-CM | POA: Insufficient documentation

## 2017-06-27 DIAGNOSIS — M5136 Other intervertebral disc degeneration, lumbar region: Secondary | ICD-10-CM | POA: Diagnosis not present

## 2017-06-30 DIAGNOSIS — L603 Nail dystrophy: Secondary | ICD-10-CM | POA: Diagnosis not present

## 2017-06-30 DIAGNOSIS — L821 Other seborrheic keratosis: Secondary | ICD-10-CM | POA: Diagnosis not present

## 2017-06-30 DIAGNOSIS — L218 Other seborrheic dermatitis: Secondary | ICD-10-CM | POA: Diagnosis not present

## 2017-08-15 DIAGNOSIS — Z1389 Encounter for screening for other disorder: Secondary | ICD-10-CM | POA: Diagnosis not present

## 2017-08-15 DIAGNOSIS — K219 Gastro-esophageal reflux disease without esophagitis: Secondary | ICD-10-CM | POA: Diagnosis not present

## 2017-08-15 DIAGNOSIS — L989 Disorder of the skin and subcutaneous tissue, unspecified: Secondary | ICD-10-CM | POA: Diagnosis not present

## 2017-08-15 DIAGNOSIS — Z79899 Other long term (current) drug therapy: Secondary | ICD-10-CM | POA: Diagnosis not present

## 2017-08-15 DIAGNOSIS — M810 Age-related osteoporosis without current pathological fracture: Secondary | ICD-10-CM | POA: Diagnosis not present

## 2017-08-15 DIAGNOSIS — F325 Major depressive disorder, single episode, in full remission: Secondary | ICD-10-CM | POA: Diagnosis not present

## 2017-08-15 DIAGNOSIS — R0789 Other chest pain: Secondary | ICD-10-CM | POA: Diagnosis not present

## 2017-08-15 DIAGNOSIS — Z0000 Encounter for general adult medical examination without abnormal findings: Secondary | ICD-10-CM | POA: Diagnosis not present

## 2017-08-15 DIAGNOSIS — E7800 Pure hypercholesterolemia, unspecified: Secondary | ICD-10-CM | POA: Diagnosis not present

## 2017-09-07 DIAGNOSIS — N814 Uterovaginal prolapse, unspecified: Secondary | ICD-10-CM | POA: Diagnosis not present

## 2017-09-07 DIAGNOSIS — Z01411 Encounter for gynecological examination (general) (routine) with abnormal findings: Secondary | ICD-10-CM | POA: Diagnosis not present

## 2017-09-07 DIAGNOSIS — N8111 Cystocele, midline: Secondary | ICD-10-CM | POA: Diagnosis not present

## 2017-09-12 DIAGNOSIS — Z1231 Encounter for screening mammogram for malignant neoplasm of breast: Secondary | ICD-10-CM | POA: Diagnosis not present

## 2017-09-12 DIAGNOSIS — Z803 Family history of malignant neoplasm of breast: Secondary | ICD-10-CM | POA: Diagnosis not present

## 2017-10-05 DIAGNOSIS — M5136 Other intervertebral disc degeneration, lumbar region: Secondary | ICD-10-CM | POA: Diagnosis not present

## 2017-10-20 DIAGNOSIS — M810 Age-related osteoporosis without current pathological fracture: Secondary | ICD-10-CM | POA: Diagnosis not present

## 2017-10-20 DIAGNOSIS — F329 Major depressive disorder, single episode, unspecified: Secondary | ICD-10-CM | POA: Diagnosis not present

## 2017-10-30 DIAGNOSIS — F325 Major depressive disorder, single episode, in full remission: Secondary | ICD-10-CM | POA: Diagnosis not present

## 2017-10-30 DIAGNOSIS — M810 Age-related osteoporosis without current pathological fracture: Secondary | ICD-10-CM | POA: Diagnosis not present

## 2017-12-19 DIAGNOSIS — M810 Age-related osteoporosis without current pathological fracture: Secondary | ICD-10-CM | POA: Diagnosis not present

## 2017-12-19 DIAGNOSIS — F329 Major depressive disorder, single episode, unspecified: Secondary | ICD-10-CM | POA: Diagnosis not present

## 2018-01-09 DIAGNOSIS — M5136 Other intervertebral disc degeneration, lumbar region: Secondary | ICD-10-CM | POA: Diagnosis not present

## 2018-03-01 DIAGNOSIS — D1801 Hemangioma of skin and subcutaneous tissue: Secondary | ICD-10-CM | POA: Diagnosis not present

## 2018-03-01 DIAGNOSIS — D2239 Melanocytic nevi of other parts of face: Secondary | ICD-10-CM | POA: Diagnosis not present

## 2018-03-01 DIAGNOSIS — D2262 Melanocytic nevi of left upper limb, including shoulder: Secondary | ICD-10-CM | POA: Diagnosis not present

## 2018-03-01 DIAGNOSIS — D2272 Melanocytic nevi of left lower limb, including hip: Secondary | ICD-10-CM | POA: Diagnosis not present

## 2018-03-01 DIAGNOSIS — L821 Other seborrheic keratosis: Secondary | ICD-10-CM | POA: Diagnosis not present

## 2018-03-01 DIAGNOSIS — L814 Other melanin hyperpigmentation: Secondary | ICD-10-CM | POA: Diagnosis not present

## 2018-03-01 DIAGNOSIS — L819 Disorder of pigmentation, unspecified: Secondary | ICD-10-CM | POA: Diagnosis not present

## 2018-03-01 DIAGNOSIS — D2271 Melanocytic nevi of right lower limb, including hip: Secondary | ICD-10-CM | POA: Diagnosis not present

## 2018-03-01 DIAGNOSIS — D485 Neoplasm of uncertain behavior of skin: Secondary | ICD-10-CM | POA: Diagnosis not present

## 2018-03-18 DIAGNOSIS — F325 Major depressive disorder, single episode, in full remission: Secondary | ICD-10-CM | POA: Diagnosis not present

## 2018-03-18 DIAGNOSIS — M810 Age-related osteoporosis without current pathological fracture: Secondary | ICD-10-CM | POA: Diagnosis not present

## 2018-03-18 DIAGNOSIS — F329 Major depressive disorder, single episode, unspecified: Secondary | ICD-10-CM | POA: Diagnosis not present

## 2018-04-12 DIAGNOSIS — M5136 Other intervertebral disc degeneration, lumbar region: Secondary | ICD-10-CM | POA: Diagnosis not present

## 2018-04-16 DIAGNOSIS — M25371 Other instability, right ankle: Secondary | ICD-10-CM | POA: Diagnosis not present

## 2018-04-16 DIAGNOSIS — M79671 Pain in right foot: Secondary | ICD-10-CM | POA: Diagnosis not present

## 2018-04-16 DIAGNOSIS — L97519 Non-pressure chronic ulcer of other part of right foot with unspecified severity: Secondary | ICD-10-CM | POA: Diagnosis not present

## 2018-05-01 DIAGNOSIS — L97519 Non-pressure chronic ulcer of other part of right foot with unspecified severity: Secondary | ICD-10-CM | POA: Diagnosis not present

## 2018-05-01 DIAGNOSIS — R278 Other lack of coordination: Secondary | ICD-10-CM | POA: Diagnosis not present

## 2018-05-01 DIAGNOSIS — R2689 Other abnormalities of gait and mobility: Secondary | ICD-10-CM | POA: Diagnosis not present

## 2018-05-01 DIAGNOSIS — M25371 Other instability, right ankle: Secondary | ICD-10-CM | POA: Diagnosis not present

## 2018-05-07 DIAGNOSIS — R2689 Other abnormalities of gait and mobility: Secondary | ICD-10-CM | POA: Diagnosis not present

## 2018-05-07 DIAGNOSIS — L97519 Non-pressure chronic ulcer of other part of right foot with unspecified severity: Secondary | ICD-10-CM | POA: Diagnosis not present

## 2018-05-07 DIAGNOSIS — R278 Other lack of coordination: Secondary | ICD-10-CM | POA: Diagnosis not present

## 2018-05-07 DIAGNOSIS — M25371 Other instability, right ankle: Secondary | ICD-10-CM | POA: Diagnosis not present

## 2018-05-15 DIAGNOSIS — R278 Other lack of coordination: Secondary | ICD-10-CM | POA: Diagnosis not present

## 2018-05-15 DIAGNOSIS — R2689 Other abnormalities of gait and mobility: Secondary | ICD-10-CM | POA: Diagnosis not present

## 2018-05-15 DIAGNOSIS — F329 Major depressive disorder, single episode, unspecified: Secondary | ICD-10-CM | POA: Diagnosis not present

## 2018-05-15 DIAGNOSIS — L97519 Non-pressure chronic ulcer of other part of right foot with unspecified severity: Secondary | ICD-10-CM | POA: Diagnosis not present

## 2018-05-15 DIAGNOSIS — F325 Major depressive disorder, single episode, in full remission: Secondary | ICD-10-CM | POA: Diagnosis not present

## 2018-05-15 DIAGNOSIS — M25371 Other instability, right ankle: Secondary | ICD-10-CM | POA: Diagnosis not present

## 2018-05-15 DIAGNOSIS — M810 Age-related osteoporosis without current pathological fracture: Secondary | ICD-10-CM | POA: Diagnosis not present

## 2018-05-24 DIAGNOSIS — R2689 Other abnormalities of gait and mobility: Secondary | ICD-10-CM | POA: Diagnosis not present

## 2018-05-24 DIAGNOSIS — R278 Other lack of coordination: Secondary | ICD-10-CM | POA: Diagnosis not present

## 2018-05-24 DIAGNOSIS — L97519 Non-pressure chronic ulcer of other part of right foot with unspecified severity: Secondary | ICD-10-CM | POA: Diagnosis not present

## 2018-05-24 DIAGNOSIS — M25371 Other instability, right ankle: Secondary | ICD-10-CM | POA: Diagnosis not present

## 2018-05-29 ENCOUNTER — Other Ambulatory Visit: Payer: Self-pay | Admitting: Geriatric Medicine

## 2018-05-29 ENCOUNTER — Ambulatory Visit
Admission: RE | Admit: 2018-05-29 | Discharge: 2018-05-29 | Disposition: A | Discharge status: Home / Self Care | Payer: PPO | Source: Ambulatory Visit | Attending: Geriatric Medicine | Admitting: Geriatric Medicine

## 2018-05-29 DIAGNOSIS — R0781 Pleurodynia: Secondary | ICD-10-CM

## 2018-05-30 ENCOUNTER — Other Ambulatory Visit: Payer: Self-pay | Admitting: Geriatric Medicine

## 2018-05-30 DIAGNOSIS — R911 Solitary pulmonary nodule: Secondary | ICD-10-CM

## 2018-06-05 ENCOUNTER — Ambulatory Visit
Admission: RE | Admit: 2018-06-05 | Discharge: 2018-06-05 | Disposition: A | Discharge status: Home / Self Care | Payer: PPO | Source: Ambulatory Visit | Attending: Geriatric Medicine | Admitting: Geriatric Medicine

## 2018-06-05 DIAGNOSIS — R918 Other nonspecific abnormal finding of lung field: Secondary | ICD-10-CM | POA: Diagnosis not present

## 2018-06-05 DIAGNOSIS — R911 Solitary pulmonary nodule: Secondary | ICD-10-CM

## 2018-06-05 MED ORDER — IOPAMIDOL (ISOVUE-300) INJECTION 61%
75.0000 mL | Freq: Once | INTRAVENOUS | Status: AC | PRN
  Administered 2018-06-05: 75 mL via INTRAVENOUS

## 2018-06-19 DIAGNOSIS — M810 Age-related osteoporosis without current pathological fracture: Secondary | ICD-10-CM | POA: Diagnosis not present

## 2018-06-19 DIAGNOSIS — F325 Major depressive disorder, single episode, in full remission: Secondary | ICD-10-CM | POA: Diagnosis not present

## 2018-06-19 DIAGNOSIS — F329 Major depressive disorder, single episode, unspecified: Secondary | ICD-10-CM | POA: Diagnosis not present

## 2018-07-13 DIAGNOSIS — M810 Age-related osteoporosis without current pathological fracture: Secondary | ICD-10-CM | POA: Diagnosis not present

## 2018-07-13 DIAGNOSIS — F325 Major depressive disorder, single episode, in full remission: Secondary | ICD-10-CM | POA: Diagnosis not present

## 2018-07-13 DIAGNOSIS — F329 Major depressive disorder, single episode, unspecified: Secondary | ICD-10-CM | POA: Diagnosis not present

## 2018-08-13 DIAGNOSIS — F329 Major depressive disorder, single episode, unspecified: Secondary | ICD-10-CM | POA: Diagnosis not present

## 2018-08-13 DIAGNOSIS — Z79899 Other long term (current) drug therapy: Secondary | ICD-10-CM | POA: Diagnosis not present

## 2018-08-13 DIAGNOSIS — M810 Age-related osteoporosis without current pathological fracture: Secondary | ICD-10-CM | POA: Diagnosis not present

## 2018-08-13 DIAGNOSIS — F325 Major depressive disorder, single episode, in full remission: Secondary | ICD-10-CM | POA: Diagnosis not present

## 2018-08-13 DIAGNOSIS — Z0000 Encounter for general adult medical examination without abnormal findings: Secondary | ICD-10-CM | POA: Diagnosis not present

## 2018-08-13 DIAGNOSIS — E7800 Pure hypercholesterolemia, unspecified: Secondary | ICD-10-CM | POA: Diagnosis not present

## 2018-08-13 DIAGNOSIS — K219 Gastro-esophageal reflux disease without esophagitis: Secondary | ICD-10-CM | POA: Diagnosis not present

## 2018-09-20 DIAGNOSIS — M5136 Other intervertebral disc degeneration, lumbar region: Secondary | ICD-10-CM | POA: Diagnosis not present

## 2018-10-10 DIAGNOSIS — F325 Major depressive disorder, single episode, in full remission: Secondary | ICD-10-CM | POA: Diagnosis not present

## 2018-10-10 DIAGNOSIS — F329 Major depressive disorder, single episode, unspecified: Secondary | ICD-10-CM | POA: Diagnosis not present

## 2018-10-10 DIAGNOSIS — M810 Age-related osteoporosis without current pathological fracture: Secondary | ICD-10-CM | POA: Diagnosis not present

## 2018-10-22 DIAGNOSIS — Z79899 Other long term (current) drug therapy: Secondary | ICD-10-CM | POA: Diagnosis not present

## 2018-10-22 DIAGNOSIS — E7800 Pure hypercholesterolemia, unspecified: Secondary | ICD-10-CM | POA: Diagnosis not present

## 2018-11-05 DIAGNOSIS — F329 Major depressive disorder, single episode, unspecified: Secondary | ICD-10-CM | POA: Diagnosis not present

## 2018-11-05 DIAGNOSIS — F325 Major depressive disorder, single episode, in full remission: Secondary | ICD-10-CM | POA: Diagnosis not present

## 2018-11-05 DIAGNOSIS — M810 Age-related osteoporosis without current pathological fracture: Secondary | ICD-10-CM | POA: Diagnosis not present

## 2018-11-12 DIAGNOSIS — Z961 Presence of intraocular lens: Secondary | ICD-10-CM | POA: Diagnosis not present

## 2018-12-24 DIAGNOSIS — F329 Major depressive disorder, single episode, unspecified: Secondary | ICD-10-CM | POA: Diagnosis not present

## 2018-12-24 DIAGNOSIS — F325 Major depressive disorder, single episode, in full remission: Secondary | ICD-10-CM | POA: Diagnosis not present

## 2018-12-24 DIAGNOSIS — M810 Age-related osteoporosis without current pathological fracture: Secondary | ICD-10-CM | POA: Diagnosis not present

## 2018-12-25 DIAGNOSIS — F325 Major depressive disorder, single episode, in full remission: Secondary | ICD-10-CM | POA: Diagnosis not present

## 2018-12-25 DIAGNOSIS — M5136 Other intervertebral disc degeneration, lumbar region: Secondary | ICD-10-CM | POA: Diagnosis not present

## 2018-12-25 DIAGNOSIS — F329 Major depressive disorder, single episode, unspecified: Secondary | ICD-10-CM | POA: Diagnosis not present

## 2018-12-25 DIAGNOSIS — M810 Age-related osteoporosis without current pathological fracture: Secondary | ICD-10-CM | POA: Diagnosis not present

## 2019-02-01 DIAGNOSIS — F325 Major depressive disorder, single episode, in full remission: Secondary | ICD-10-CM | POA: Diagnosis not present

## 2019-02-01 DIAGNOSIS — F329 Major depressive disorder, single episode, unspecified: Secondary | ICD-10-CM | POA: Diagnosis not present

## 2019-02-01 DIAGNOSIS — E7800 Pure hypercholesterolemia, unspecified: Secondary | ICD-10-CM | POA: Diagnosis not present

## 2019-02-01 DIAGNOSIS — M810 Age-related osteoporosis without current pathological fracture: Secondary | ICD-10-CM | POA: Diagnosis not present

## 2019-03-07 DIAGNOSIS — E7800 Pure hypercholesterolemia, unspecified: Secondary | ICD-10-CM | POA: Diagnosis not present

## 2019-03-07 DIAGNOSIS — M810 Age-related osteoporosis without current pathological fracture: Secondary | ICD-10-CM | POA: Diagnosis not present

## 2019-03-07 DIAGNOSIS — F325 Major depressive disorder, single episode, in full remission: Secondary | ICD-10-CM | POA: Diagnosis not present

## 2019-03-07 DIAGNOSIS — F329 Major depressive disorder, single episode, unspecified: Secondary | ICD-10-CM | POA: Diagnosis not present

## 2019-03-11 ENCOUNTER — Other Ambulatory Visit: Payer: Self-pay

## 2019-03-11 DIAGNOSIS — Z20822 Contact with and (suspected) exposure to covid-19: Secondary | ICD-10-CM

## 2019-03-13 LAB — NOVEL CORONAVIRUS, NAA: SARS-CoV-2, NAA: NOT DETECTED

## 2019-03-26 DIAGNOSIS — R05 Cough: Secondary | ICD-10-CM | POA: Diagnosis not present

## 2019-04-01 DIAGNOSIS — R0789 Other chest pain: Secondary | ICD-10-CM | POA: Diagnosis not present

## 2019-04-01 DIAGNOSIS — R05 Cough: Secondary | ICD-10-CM | POA: Diagnosis not present

## 2019-04-01 DIAGNOSIS — H8113 Benign paroxysmal vertigo, bilateral: Secondary | ICD-10-CM | POA: Diagnosis not present

## 2019-04-04 DIAGNOSIS — M5136 Other intervertebral disc degeneration, lumbar region: Secondary | ICD-10-CM | POA: Diagnosis not present

## 2019-04-15 DIAGNOSIS — F329 Major depressive disorder, single episode, unspecified: Secondary | ICD-10-CM | POA: Diagnosis not present

## 2019-04-15 DIAGNOSIS — M810 Age-related osteoporosis without current pathological fracture: Secondary | ICD-10-CM | POA: Diagnosis not present

## 2019-04-15 DIAGNOSIS — E7800 Pure hypercholesterolemia, unspecified: Secondary | ICD-10-CM | POA: Diagnosis not present

## 2019-04-15 DIAGNOSIS — F325 Major depressive disorder, single episode, in full remission: Secondary | ICD-10-CM | POA: Diagnosis not present

## 2019-05-03 DIAGNOSIS — F329 Major depressive disorder, single episode, unspecified: Secondary | ICD-10-CM | POA: Diagnosis not present

## 2019-05-03 DIAGNOSIS — E7800 Pure hypercholesterolemia, unspecified: Secondary | ICD-10-CM | POA: Diagnosis not present

## 2019-05-03 DIAGNOSIS — F325 Major depressive disorder, single episode, in full remission: Secondary | ICD-10-CM | POA: Diagnosis not present

## 2019-05-03 DIAGNOSIS — M810 Age-related osteoporosis without current pathological fracture: Secondary | ICD-10-CM | POA: Diagnosis not present

## 2019-05-08 DIAGNOSIS — Z961 Presence of intraocular lens: Secondary | ICD-10-CM | POA: Diagnosis not present

## 2019-05-15 DIAGNOSIS — L819 Disorder of pigmentation, unspecified: Secondary | ICD-10-CM | POA: Diagnosis not present

## 2019-05-15 DIAGNOSIS — L603 Nail dystrophy: Secondary | ICD-10-CM | POA: Diagnosis not present

## 2019-05-15 DIAGNOSIS — D2272 Melanocytic nevi of left lower limb, including hip: Secondary | ICD-10-CM | POA: Diagnosis not present

## 2019-05-15 DIAGNOSIS — D2239 Melanocytic nevi of other parts of face: Secondary | ICD-10-CM | POA: Diagnosis not present

## 2019-05-15 DIAGNOSIS — Q828 Other specified congenital malformations of skin: Secondary | ICD-10-CM | POA: Diagnosis not present

## 2019-05-15 DIAGNOSIS — D1801 Hemangioma of skin and subcutaneous tissue: Secondary | ICD-10-CM | POA: Diagnosis not present

## 2019-05-15 DIAGNOSIS — D225 Melanocytic nevi of trunk: Secondary | ICD-10-CM | POA: Diagnosis not present

## 2019-05-15 DIAGNOSIS — L814 Other melanin hyperpigmentation: Secondary | ICD-10-CM | POA: Diagnosis not present

## 2019-05-15 DIAGNOSIS — L821 Other seborrheic keratosis: Secondary | ICD-10-CM | POA: Diagnosis not present

## 2019-06-28 DIAGNOSIS — E7800 Pure hypercholesterolemia, unspecified: Secondary | ICD-10-CM | POA: Diagnosis not present

## 2019-06-28 DIAGNOSIS — M810 Age-related osteoporosis without current pathological fracture: Secondary | ICD-10-CM | POA: Diagnosis not present

## 2019-06-28 DIAGNOSIS — F325 Major depressive disorder, single episode, in full remission: Secondary | ICD-10-CM | POA: Diagnosis not present

## 2019-06-28 DIAGNOSIS — F329 Major depressive disorder, single episode, unspecified: Secondary | ICD-10-CM | POA: Diagnosis not present

## 2019-08-06 DIAGNOSIS — M5136 Other intervertebral disc degeneration, lumbar region: Secondary | ICD-10-CM | POA: Diagnosis not present

## 2019-08-09 DIAGNOSIS — R202 Paresthesia of skin: Secondary | ICD-10-CM | POA: Diagnosis not present

## 2019-08-19 ENCOUNTER — Other Ambulatory Visit: Payer: Self-pay | Admitting: Geriatric Medicine

## 2019-08-19 DIAGNOSIS — Z0000 Encounter for general adult medical examination without abnormal findings: Secondary | ICD-10-CM | POA: Diagnosis not present

## 2019-08-19 DIAGNOSIS — Z79899 Other long term (current) drug therapy: Secondary | ICD-10-CM | POA: Diagnosis not present

## 2019-08-19 DIAGNOSIS — I700 Atherosclerosis of aorta: Secondary | ICD-10-CM | POA: Diagnosis not present

## 2019-08-19 DIAGNOSIS — R42 Dizziness and giddiness: Secondary | ICD-10-CM | POA: Diagnosis not present

## 2019-08-19 DIAGNOSIS — M810 Age-related osteoporosis without current pathological fracture: Secondary | ICD-10-CM | POA: Diagnosis not present

## 2019-08-19 DIAGNOSIS — K219 Gastro-esophageal reflux disease without esophagitis: Secondary | ICD-10-CM | POA: Diagnosis not present

## 2019-08-19 DIAGNOSIS — R030 Elevated blood-pressure reading, without diagnosis of hypertension: Secondary | ICD-10-CM | POA: Diagnosis not present

## 2019-08-19 DIAGNOSIS — Z1389 Encounter for screening for other disorder: Secondary | ICD-10-CM | POA: Diagnosis not present

## 2019-08-21 DIAGNOSIS — M25512 Pain in left shoulder: Secondary | ICD-10-CM | POA: Diagnosis not present

## 2019-09-10 DIAGNOSIS — Z01419 Encounter for gynecological examination (general) (routine) without abnormal findings: Secondary | ICD-10-CM | POA: Diagnosis not present

## 2019-09-10 DIAGNOSIS — N8111 Cystocele, midline: Secondary | ICD-10-CM | POA: Diagnosis not present

## 2019-09-28 DIAGNOSIS — M810 Age-related osteoporosis without current pathological fracture: Secondary | ICD-10-CM | POA: Diagnosis not present

## 2019-09-28 DIAGNOSIS — F325 Major depressive disorder, single episode, in full remission: Secondary | ICD-10-CM | POA: Diagnosis not present

## 2019-10-01 DIAGNOSIS — J301 Allergic rhinitis due to pollen: Secondary | ICD-10-CM | POA: Diagnosis not present

## 2019-11-01 ENCOUNTER — Other Ambulatory Visit: Payer: PPO

## 2019-11-04 DIAGNOSIS — Z780 Asymptomatic menopausal state: Secondary | ICD-10-CM | POA: Diagnosis not present

## 2019-11-04 DIAGNOSIS — M8589 Other specified disorders of bone density and structure, multiple sites: Secondary | ICD-10-CM | POA: Diagnosis not present

## 2019-11-04 DIAGNOSIS — Z1231 Encounter for screening mammogram for malignant neoplasm of breast: Secondary | ICD-10-CM | POA: Diagnosis not present

## 2019-11-26 DIAGNOSIS — M5136 Other intervertebral disc degeneration, lumbar region: Secondary | ICD-10-CM | POA: Diagnosis not present

## 2019-12-09 DIAGNOSIS — R202 Paresthesia of skin: Secondary | ICD-10-CM | POA: Diagnosis not present

## 2019-12-09 DIAGNOSIS — R200 Anesthesia of skin: Secondary | ICD-10-CM | POA: Diagnosis not present

## 2019-12-09 DIAGNOSIS — Z7189 Other specified counseling: Secondary | ICD-10-CM | POA: Diagnosis not present

## 2019-12-24 DIAGNOSIS — F325 Major depressive disorder, single episode, in full remission: Secondary | ICD-10-CM | POA: Diagnosis not present

## 2019-12-24 DIAGNOSIS — M810 Age-related osteoporosis without current pathological fracture: Secondary | ICD-10-CM | POA: Diagnosis not present

## 2020-02-05 DIAGNOSIS — R5383 Other fatigue: Secondary | ICD-10-CM | POA: Diagnosis not present

## 2020-02-05 DIAGNOSIS — Z20822 Contact with and (suspected) exposure to covid-19: Secondary | ICD-10-CM | POA: Diagnosis not present

## 2020-03-24 DIAGNOSIS — F325 Major depressive disorder, single episode, in full remission: Secondary | ICD-10-CM | POA: Diagnosis not present

## 2020-03-24 DIAGNOSIS — K219 Gastro-esophageal reflux disease without esophagitis: Secondary | ICD-10-CM | POA: Diagnosis not present

## 2020-03-24 DIAGNOSIS — M47816 Spondylosis without myelopathy or radiculopathy, lumbar region: Secondary | ICD-10-CM | POA: Diagnosis not present

## 2020-03-24 DIAGNOSIS — M810 Age-related osteoporosis without current pathological fracture: Secondary | ICD-10-CM | POA: Diagnosis not present

## 2020-04-17 DIAGNOSIS — K219 Gastro-esophageal reflux disease without esophagitis: Secondary | ICD-10-CM | POA: Diagnosis not present

## 2020-04-17 DIAGNOSIS — F325 Major depressive disorder, single episode, in full remission: Secondary | ICD-10-CM | POA: Diagnosis not present

## 2020-04-17 DIAGNOSIS — M810 Age-related osteoporosis without current pathological fracture: Secondary | ICD-10-CM | POA: Diagnosis not present

## 2020-04-22 DIAGNOSIS — Z20822 Contact with and (suspected) exposure to covid-19: Secondary | ICD-10-CM | POA: Diagnosis not present

## 2020-04-22 DIAGNOSIS — R059 Cough, unspecified: Secondary | ICD-10-CM | POA: Diagnosis not present

## 2020-04-25 HISTORY — PX: DIAGNOSTIC MAMMOGRAM: HXRAD719

## 2020-04-28 DIAGNOSIS — R42 Dizziness and giddiness: Secondary | ICD-10-CM | POA: Diagnosis not present

## 2020-04-28 DIAGNOSIS — R059 Cough, unspecified: Secondary | ICD-10-CM | POA: Diagnosis not present

## 2020-05-05 ENCOUNTER — Other Ambulatory Visit: Payer: Self-pay | Admitting: Geriatric Medicine

## 2020-05-05 ENCOUNTER — Ambulatory Visit
Admission: RE | Admit: 2020-05-05 | Discharge: 2020-05-05 | Disposition: A | Discharge status: Home / Self Care | Payer: PPO | Source: Ambulatory Visit | Attending: Geriatric Medicine | Admitting: Geriatric Medicine

## 2020-05-05 DIAGNOSIS — R059 Cough, unspecified: Secondary | ICD-10-CM

## 2020-05-05 DIAGNOSIS — I700 Atherosclerosis of aorta: Secondary | ICD-10-CM | POA: Diagnosis not present

## 2020-05-05 DIAGNOSIS — M79645 Pain in left finger(s): Secondary | ICD-10-CM | POA: Diagnosis not present

## 2020-05-18 DIAGNOSIS — K219 Gastro-esophageal reflux disease without esophagitis: Secondary | ICD-10-CM | POA: Diagnosis not present

## 2020-05-18 DIAGNOSIS — M810 Age-related osteoporosis without current pathological fracture: Secondary | ICD-10-CM | POA: Diagnosis not present

## 2020-05-18 DIAGNOSIS — F325 Major depressive disorder, single episode, in full remission: Secondary | ICD-10-CM | POA: Diagnosis not present

## 2020-06-16 DIAGNOSIS — R059 Cough, unspecified: Secondary | ICD-10-CM | POA: Diagnosis not present

## 2020-06-16 DIAGNOSIS — R519 Headache, unspecified: Secondary | ICD-10-CM | POA: Diagnosis not present

## 2020-06-16 DIAGNOSIS — R42 Dizziness and giddiness: Secondary | ICD-10-CM | POA: Diagnosis not present

## 2020-06-16 DIAGNOSIS — Z79899 Other long term (current) drug therapy: Secondary | ICD-10-CM | POA: Diagnosis not present

## 2020-06-16 DIAGNOSIS — R5383 Other fatigue: Secondary | ICD-10-CM | POA: Diagnosis not present

## 2020-07-07 DIAGNOSIS — M5416 Radiculopathy, lumbar region: Secondary | ICD-10-CM | POA: Diagnosis not present

## 2020-07-18 DIAGNOSIS — K219 Gastro-esophageal reflux disease without esophagitis: Secondary | ICD-10-CM | POA: Diagnosis not present

## 2020-07-18 DIAGNOSIS — M810 Age-related osteoporosis without current pathological fracture: Secondary | ICD-10-CM | POA: Diagnosis not present

## 2020-07-18 DIAGNOSIS — F325 Major depressive disorder, single episode, in full remission: Secondary | ICD-10-CM | POA: Diagnosis not present

## 2020-07-20 DIAGNOSIS — Z961 Presence of intraocular lens: Secondary | ICD-10-CM | POA: Diagnosis not present

## 2020-07-20 DIAGNOSIS — H5213 Myopia, bilateral: Secondary | ICD-10-CM | POA: Diagnosis not present

## 2020-07-21 DIAGNOSIS — J300 Vasomotor rhinitis: Secondary | ICD-10-CM | POA: Diagnosis not present

## 2020-07-21 DIAGNOSIS — R059 Cough, unspecified: Secondary | ICD-10-CM | POA: Diagnosis not present

## 2020-07-21 DIAGNOSIS — R42 Dizziness and giddiness: Secondary | ICD-10-CM | POA: Diagnosis not present

## 2020-07-21 DIAGNOSIS — J309 Allergic rhinitis, unspecified: Secondary | ICD-10-CM | POA: Diagnosis not present

## 2020-07-23 DIAGNOSIS — D225 Melanocytic nevi of trunk: Secondary | ICD-10-CM | POA: Diagnosis not present

## 2020-07-23 DIAGNOSIS — L814 Other melanin hyperpigmentation: Secondary | ICD-10-CM | POA: Diagnosis not present

## 2020-07-23 DIAGNOSIS — D2272 Melanocytic nevi of left lower limb, including hip: Secondary | ICD-10-CM | POA: Diagnosis not present

## 2020-07-23 DIAGNOSIS — L821 Other seborrheic keratosis: Secondary | ICD-10-CM | POA: Diagnosis not present

## 2020-07-23 DIAGNOSIS — D2271 Melanocytic nevi of right lower limb, including hip: Secondary | ICD-10-CM | POA: Diagnosis not present

## 2020-07-23 DIAGNOSIS — L718 Other rosacea: Secondary | ICD-10-CM | POA: Diagnosis not present

## 2020-07-23 DIAGNOSIS — D1801 Hemangioma of skin and subcutaneous tissue: Secondary | ICD-10-CM | POA: Diagnosis not present

## 2020-07-28 DIAGNOSIS — M5416 Radiculopathy, lumbar region: Secondary | ICD-10-CM | POA: Diagnosis not present

## 2020-08-14 DIAGNOSIS — S30861A Insect bite (nonvenomous) of abdominal wall, initial encounter: Secondary | ICD-10-CM | POA: Diagnosis not present

## 2020-08-14 DIAGNOSIS — W57XXXA Bitten or stung by nonvenomous insect and other nonvenomous arthropods, initial encounter: Secondary | ICD-10-CM | POA: Diagnosis not present

## 2020-08-25 DIAGNOSIS — K219 Gastro-esophageal reflux disease without esophagitis: Secondary | ICD-10-CM | POA: Diagnosis not present

## 2020-08-25 DIAGNOSIS — K9089 Other intestinal malabsorption: Secondary | ICD-10-CM | POA: Diagnosis not present

## 2020-08-25 DIAGNOSIS — R030 Elevated blood-pressure reading, without diagnosis of hypertension: Secondary | ICD-10-CM | POA: Diagnosis not present

## 2020-08-25 DIAGNOSIS — Z79899 Other long term (current) drug therapy: Secondary | ICD-10-CM | POA: Diagnosis not present

## 2020-08-25 DIAGNOSIS — I700 Atherosclerosis of aorta: Secondary | ICD-10-CM | POA: Diagnosis not present

## 2020-08-25 DIAGNOSIS — Z23 Encounter for immunization: Secondary | ICD-10-CM | POA: Diagnosis not present

## 2020-08-25 DIAGNOSIS — R197 Diarrhea, unspecified: Secondary | ICD-10-CM | POA: Diagnosis not present

## 2020-08-25 DIAGNOSIS — Z0000 Encounter for general adult medical examination without abnormal findings: Secondary | ICD-10-CM | POA: Diagnosis not present

## 2020-08-25 DIAGNOSIS — Z1389 Encounter for screening for other disorder: Secondary | ICD-10-CM | POA: Diagnosis not present

## 2020-08-25 DIAGNOSIS — M810 Age-related osteoporosis without current pathological fracture: Secondary | ICD-10-CM | POA: Diagnosis not present

## 2020-08-25 DIAGNOSIS — F325 Major depressive disorder, single episode, in full remission: Secondary | ICD-10-CM | POA: Diagnosis not present

## 2020-08-25 DIAGNOSIS — J4520 Mild intermittent asthma, uncomplicated: Secondary | ICD-10-CM | POA: Diagnosis not present

## 2020-10-07 DIAGNOSIS — R519 Headache, unspecified: Secondary | ICD-10-CM | POA: Diagnosis not present

## 2020-10-07 DIAGNOSIS — R42 Dizziness and giddiness: Secondary | ICD-10-CM | POA: Diagnosis not present

## 2020-10-07 DIAGNOSIS — J3089 Other allergic rhinitis: Secondary | ICD-10-CM | POA: Diagnosis not present

## 2020-10-07 DIAGNOSIS — R030 Elevated blood-pressure reading, without diagnosis of hypertension: Secondary | ICD-10-CM | POA: Diagnosis not present

## 2020-10-15 DIAGNOSIS — R42 Dizziness and giddiness: Secondary | ICD-10-CM | POA: Diagnosis not present

## 2020-10-15 DIAGNOSIS — H6123 Impacted cerumen, bilateral: Secondary | ICD-10-CM | POA: Insufficient documentation

## 2020-10-15 DIAGNOSIS — H938X3 Other specified disorders of ear, bilateral: Secondary | ICD-10-CM | POA: Diagnosis not present

## 2020-10-15 DIAGNOSIS — H9113 Presbycusis, bilateral: Secondary | ICD-10-CM | POA: Diagnosis not present

## 2020-10-15 DIAGNOSIS — Z974 Presence of external hearing-aid: Secondary | ICD-10-CM | POA: Diagnosis not present

## 2020-10-28 DIAGNOSIS — M5416 Radiculopathy, lumbar region: Secondary | ICD-10-CM | POA: Insufficient documentation

## 2020-11-03 DIAGNOSIS — Z1231 Encounter for screening mammogram for malignant neoplasm of breast: Secondary | ICD-10-CM | POA: Diagnosis not present

## 2020-11-10 DIAGNOSIS — J300 Vasomotor rhinitis: Secondary | ICD-10-CM | POA: Diagnosis not present

## 2020-11-10 DIAGNOSIS — R059 Cough, unspecified: Secondary | ICD-10-CM | POA: Diagnosis not present

## 2020-11-10 DIAGNOSIS — R42 Dizziness and giddiness: Secondary | ICD-10-CM | POA: Diagnosis not present

## 2020-11-17 DIAGNOSIS — M5416 Radiculopathy, lumbar region: Secondary | ICD-10-CM | POA: Diagnosis not present

## 2020-11-24 ENCOUNTER — Ambulatory Visit: Payer: PPO | Attending: Internal Medicine

## 2020-11-24 ENCOUNTER — Other Ambulatory Visit: Payer: Self-pay

## 2020-11-24 DIAGNOSIS — Z23 Encounter for immunization: Secondary | ICD-10-CM

## 2020-11-24 NOTE — Progress Notes (Signed)
   Covid-19 Vaccination Clinic  Name:  Tammy Lindsey    MRN: [id number] DOB: [birth year]  11/24/2020  [name] was observed post Covid-19 immunization for 15 minutes without incident. She was provided with Vaccine Information Sheet and instruction to access the V-Safe system.   [name] was instructed to call 911 with any severe reactions post vaccine: Difficulty breathing  Swelling of face and throat  A fast heartbeat  A bad rash all over body  Dizziness and weakness   Immunizations Administered     Name Date Dose VIS Date Route   Moderna Covid-19 Booster Vaccine 11/24/2020 12:22 PM 0.25 mL 02/12/2020 Intramuscular   Manufacturer: Moderna   Lot: [id number]   [id number]

## 2021-01-04 DIAGNOSIS — I10 Essential (primary) hypertension: Secondary | ICD-10-CM | POA: Diagnosis not present

## 2021-01-04 DIAGNOSIS — M720 Palmar fascial fibromatosis [Dupuytren]: Secondary | ICD-10-CM | POA: Diagnosis not present

## 2021-01-14 ENCOUNTER — Emergency Department (HOSPITAL_BASED_OUTPATIENT_CLINIC_OR_DEPARTMENT_OTHER)
Admission: EM | Admit: 2021-01-14 | Discharge: 2021-01-15 | Disposition: A | Discharge status: Home / Self Care | Payer: PPO | Attending: Emergency Medicine | Admitting: Emergency Medicine

## 2021-01-14 ENCOUNTER — Other Ambulatory Visit: Payer: Self-pay

## 2021-01-14 ENCOUNTER — Encounter (HOSPITAL_BASED_OUTPATIENT_CLINIC_OR_DEPARTMENT_OTHER): Payer: Self-pay | Admitting: Obstetrics and Gynecology

## 2021-01-14 ENCOUNTER — Emergency Department (HOSPITAL_BASED_OUTPATIENT_CLINIC_OR_DEPARTMENT_OTHER): Payer: PPO

## 2021-01-14 DIAGNOSIS — Z9660 Presence of unspecified orthopedic joint implant: Secondary | ICD-10-CM | POA: Diagnosis not present

## 2021-01-14 DIAGNOSIS — Z7982 Long term (current) use of aspirin: Secondary | ICD-10-CM | POA: Insufficient documentation

## 2021-01-14 DIAGNOSIS — R519 Headache, unspecified: Secondary | ICD-10-CM | POA: Insufficient documentation

## 2021-01-14 DIAGNOSIS — Z79899 Other long term (current) drug therapy: Secondary | ICD-10-CM | POA: Insufficient documentation

## 2021-01-14 DIAGNOSIS — I10 Essential (primary) hypertension: Secondary | ICD-10-CM

## 2021-01-14 LAB — CBC WITH DIFFERENTIAL/PLATELET
Abs Immature Granulocytes: 0.03 10*3/uL (ref 0.00–0.07)
Basophils Absolute: 0 10*3/uL (ref 0.0–0.1)
Basophils Relative: 1 %
Eosinophils Absolute: 0.1 10*3/uL (ref 0.0–0.5)
Eosinophils Relative: 2 %
HCT: 42.7 % (ref 36.0–46.0)
Hemoglobin: 14.3 g/dL (ref 12.0–15.0)
Immature Granulocytes: 0 %
Lymphocytes Relative: 22 %
Lymphs Abs: 1.7 10*3/uL (ref 0.7–4.0)
MCH: 27.7 pg (ref 26.0–34.0)
MCHC: 33.5 g/dL (ref 30.0–36.0)
MCV: 82.8 fL (ref 80.0–100.0)
Monocytes Absolute: 0.7 10*3/uL (ref 0.1–1.0)
Monocytes Relative: 9 %
Neutro Abs: 5.2 10*3/uL (ref 1.7–7.7)
Neutrophils Relative %: 66 %
Platelets: 278 10*3/uL (ref 150–400)
RBC: 5.16 MIL/uL — ABNORMAL HIGH (ref 3.87–5.11)
RDW: 13.3 % (ref 11.5–15.5)
WBC: 7.8 10*3/uL (ref 4.0–10.5)
nRBC: 0 % (ref 0.0–0.2)

## 2021-01-14 LAB — BASIC METABOLIC PANEL
Anion gap: 10 (ref 5–15)
BUN: 10 mg/dL (ref 8–23)
CO2: 27 mmol/L (ref 22–32)
Calcium: 9.6 mg/dL (ref 8.9–10.3)
Chloride: 101 mmol/L (ref 98–111)
Creatinine, Ser: 0.62 mg/dL (ref 0.44–1.00)
GFR, Estimated: >60 mL/min (ref >60)
Glucose, Bld: 125 mg/dL — ABNORMAL HIGH (ref 70–99)
Potassium: 4.3 mmol/L (ref 3.5–5.1)
Sodium: 138 mmol/L (ref 135–145)

## 2021-01-14 LAB — TROPONIN I (HIGH SENSITIVITY): Troponin I (High Sensitivity): 3 ng/L (ref <18)

## 2021-01-14 NOTE — ED Triage Notes (Signed)
Patient reports to the ER for Hypertension. Patient reports it was progressively getting higher. Patient takes Amlodopine for HTN.

## 2021-01-15 ENCOUNTER — Other Ambulatory Visit: Payer: Self-pay

## 2021-01-15 MED ORDER — CLONIDINE HCL 0.1 MG PO TABS
0.1000 mg | ORAL_TABLET | Freq: Once | ORAL | Status: AC
  Administered 2021-01-15: 0.1 mg via ORAL
  Filled 2021-01-15: qty 1

## 2021-01-15 NOTE — ED Provider Notes (Signed)
[facility] EMERGENCY DEPT Provider Note   CSN: [id number] Arrival date & time: 01/14/21  1847     History Chief Complaint  Patient presents with   Hypertension    Tammy Lindsey is a 87 y.o. female.  Patient presents to the emergency department for evaluation of elevated blood pressure.  Patient reports that she has been watching her blood pressure for some time with her primary doctor and was just started on amlodipine a week ago.  She has been checking her blood pressure as instructed and tonight it was elevated.  Patient reports that it was 563 systolic at home before coming to the ER.  Reports that she had a slight pressure at her temples earlier but no other symptoms.  Denies chest pain, shortness of breath.      Past Medical History:  Diagnosis Date   Depression    Diverticulosis    Esophageal reflux    Hiatal hernia    Hx of colonic polyps    Osteoarthritis    Osteopenia    bone density 1-09 and 2-13   Pulmonary nodules    scattered-CT Scan July 2010, 12-10, and 04-2010-all stable felt benign     Patient Active Problem List   Diagnosis Date Noted   Numbness 07/02/2015   Varicose veins of lower extremities with other complications 89/37/3428   Cough, persistent 01/22/2012    Past Surgical History:  Procedure Laterality Date   CHOLECYSTECTOMY  1974   Gall Bladder   JOINT REPLACEMENT Left May 2012   JOINT REPLACEMENT Left 2012   Reverse    orthroscopic  Rotator Cuff Left 2011   Partial shoulder Left 2005   ROTATOR CUFF REPAIR     x 2 1998 and [place]   disk   TONSILLECTOMY  1952     OB History   No obstetric history on file.     Family History  Problem Relation Age of Onset   Asthma Father    Cancer Mother        cervical   Heart disease Sister        Heart disease before age 62   Hypertension Sister    Cancer Daughter        Breast    Neuropathy Neg Hx     Social History   Tobacco Use   Smoking status:  Never   Smokeless tobacco: Never  Substance Use Topics   Alcohol use: Yes    Alcohol/week: 1.0 standard drink    Types: 1 Glasses of wine per week    Comment: will at times have margarita   Drug use: No    Home Medications Prior to Admission medications   Medication Sig Start Date End Date Taking? Authorizing Provider  aspirin 81 MG tablet Take 81 mg by mouth daily.    [provider]  calcium carbonate (TUMS - DOSED IN MG ELEMENTAL CALCIUM) 500 MG chewable tablet Chew by mouth every other day.    [provider]  Calcium Carbonate-Vitamin D 600-400 MG-UNIT per tablet Take 1 tablet by mouth daily.    [provider]  DEXILANT 60 MG capsule  07/28/16   [provider]  fluocinonide cream (LIDEX) 0.05 % continuous as needed. 07/05/12   [provider]  LIDODERM 5 % 1 patch continuous as needed. To intact skin remove after 12 hours once a day 01/10/12   [provider]  LORazepam (ATIVAN) 0.5 MG tablet Take 0.5  mg by mouth.    [provider]  omeprazole-sodium bicarbonate (ZEGERID) 40-1100 MG per capsule Take 1 tablet by mouth daily. 01/10/12   [provider]  triamcinolone (KENALOG) 0.1 % paste Place 1 application onto teeth continuous as needed.     [provider]    Allergies    Sulfa antibiotics  Review of Systems   Review of Systems  Neurological:  Positive for headaches.  All other systems reviewed and are negative.  Physical Exam Updated Vital Signs BP (!) 171/81   Pulse 79   Temp 98.1 F (36.7 C)   Resp 18   SpO2 100%   Physical Exam Vitals and nursing note reviewed.  Constitutional:      General: She is not in acute distress.    Appearance: Normal appearance. She is well-developed.  HENT:     Head: Normocephalic and atraumatic.     Right Ear: Hearing normal.     Left Ear: Hearing normal.     Nose: Nose normal.  Eyes:     Conjunctiva/sclera: Conjunctivae normal.     Pupils: Pupils  are equal, round, and reactive to light.  Cardiovascular:     Rate and Rhythm: Regular rhythm.     Heart sounds: S1 normal and S2 normal. No murmur heard.   No friction rub. No gallop.  Pulmonary:     Effort: Pulmonary effort is normal. No respiratory distress.     Breath sounds: Normal breath sounds.  Chest:     Chest wall: No tenderness.  Abdominal:     General: Bowel sounds are normal.     Palpations: Abdomen is soft.     Tenderness: There is no abdominal tenderness. There is no guarding or rebound. Negative signs include Murphy's sign and McBurney's sign.     Hernia: No hernia is present.  Musculoskeletal:        General: Normal range of motion.     Cervical back: Normal range of motion and neck supple.  Skin:    General: Skin is warm and dry.     Findings: No rash.  Neurological:     Mental Status: She is alert and oriented to person, place, and time.     GCS: GCS eye subscore is 4. GCS verbal subscore is 5. GCS motor subscore is 6.     Cranial Nerves: No cranial nerve deficit.     Sensory: No sensory deficit.     Coordination: Coordination normal.     Comments: Extraocular muscle movement: normal No visual field cut Pupils: equal and reactive both direct and consensual response is normal No nystagmus present    Sensory function is intact to light touch, pinprick Proprioception intact  Grip strength 5/5 symmetric in upper extremities No pronator drift Normal finger to nose bilaterally  Lower extremity strength 5/5 against gravity Normal heel to shin bilaterally  Gait: normal   Psychiatric:        Speech: Speech normal.        Behavior: Behavior normal.        Thought Content: Thought content normal.    ED Results / Procedures / Treatments   Labs (all labs ordered are listed, but only abnormal results are displayed) Labs Reviewed  CBC WITH DIFFERENTIAL/PLATELET - Abnormal; Notable for the following components:      Result Value   RBC 5.16 (*)    All other  components within normal limits  BASIC METABOLIC PANEL - Abnormal; Notable for the following components:  Glucose, Bld 125 (*)    All other components within normal limits  TROPONIN I (HIGH SENSITIVITY)    EKG None  Radiology CT HEAD WO CONTRAST (5MM)  Result Date: 01/14/2021 CLINICAL DATA:  Worsening headache. EXAM: CT HEAD WITHOUT CONTRAST TECHNIQUE: Contiguous axial images were obtained from the base of the skull through the vertex without intravenous contrast. COMPARISON:  Brain MRI dated 07/10/2015. FINDINGS: Brain: Mild age-related atrophy and chronic microvascular ischemic changes. 5 there is no acute intracranial hemorrhage. No mass effect or midline shift. No extra-axial fluid collection. Vascular: No hyperdense vessel or unexpected calcification. Skull: Normal. Negative for fracture or focal lesion. Sinuses/Orbits: No acute finding. Other: None IMPRESSION: 1. No acute intracranial pathology. 2. Mild age-related atrophy and chronic microvascular ischemic changes. Electronically Signed   By: Anner Crete M.D.   On: 01/14/2021 20:20    Procedures Procedures   Medications Ordered in ED Medications  cloNIDine (CATAPRES) tablet 0.1 mg (0.1 mg Oral Given 01/15/21 0038)    ED Course  I have reviewed the triage vital signs and the nursing notes.  Pertinent labs & imaging results that were available during my care of the patient were reviewed by me and considered in my medical decision making (see chart for details).    MDM Rules/Calculators/A&P                           Patient presents with essentially asymptomatic hypertension.  Patient recently diagnosed with essential hypertension and started on amlodipine.  Blood pressure persistently elevated here in the department.  Work-up has been reassuring. Final Clinical Impression(s) / ED Diagnoses Final diagnoses:  Primary hypertension    Rx / DC Orders ED Discharge Orders     None        Shavanna Furnari, Gwenyth Allegra,  MD 01/15/21 [phone]

## 2021-01-15 NOTE — Discharge Instructions (Addendum)
Take 2 of the amlodipine tablets (a total of 5 mg) each morning.  Call Dr. Felipa Eth later today to schedule a follow-up office visit for next week.

## 2021-01-19 DIAGNOSIS — I10 Essential (primary) hypertension: Secondary | ICD-10-CM | POA: Diagnosis not present

## 2021-01-27 DIAGNOSIS — K219 Gastro-esophageal reflux disease without esophagitis: Secondary | ICD-10-CM | POA: Diagnosis not present

## 2021-01-27 DIAGNOSIS — I10 Essential (primary) hypertension: Secondary | ICD-10-CM | POA: Diagnosis not present

## 2021-02-16 DIAGNOSIS — I10 Essential (primary) hypertension: Secondary | ICD-10-CM | POA: Diagnosis not present

## 2021-03-22 DIAGNOSIS — H04122 Dry eye syndrome of left lacrimal gland: Secondary | ICD-10-CM | POA: Diagnosis not present

## 2021-03-22 DIAGNOSIS — H10412 Chronic giant papillary conjunctivitis, left eye: Secondary | ICD-10-CM | POA: Diagnosis not present

## 2021-03-25 DIAGNOSIS — M5416 Radiculopathy, lumbar region: Secondary | ICD-10-CM | POA: Diagnosis not present

## 2021-04-25 HISTORY — PX: DG PORTABLE CHEST X RAY (ARMC HX): HXRAD1591

## 2021-05-20 DIAGNOSIS — R030 Elevated blood-pressure reading, without diagnosis of hypertension: Secondary | ICD-10-CM | POA: Diagnosis not present

## 2021-05-20 DIAGNOSIS — A084 Viral intestinal infection, unspecified: Secondary | ICD-10-CM | POA: Diagnosis not present

## 2021-05-20 DIAGNOSIS — I10 Essential (primary) hypertension: Secondary | ICD-10-CM | POA: Diagnosis not present

## 2021-06-04 DIAGNOSIS — I10 Essential (primary) hypertension: Secondary | ICD-10-CM | POA: Diagnosis not present

## 2021-06-14 DIAGNOSIS — I10 Essential (primary) hypertension: Secondary | ICD-10-CM | POA: Diagnosis not present

## 2021-06-14 DIAGNOSIS — R052 Subacute cough: Secondary | ICD-10-CM | POA: Diagnosis not present

## 2021-07-09 DIAGNOSIS — M810 Age-related osteoporosis without current pathological fracture: Secondary | ICD-10-CM | POA: Diagnosis not present

## 2021-07-09 DIAGNOSIS — J4520 Mild intermittent asthma, uncomplicated: Secondary | ICD-10-CM | POA: Diagnosis not present

## 2021-07-09 DIAGNOSIS — K219 Gastro-esophageal reflux disease without esophagitis: Secondary | ICD-10-CM | POA: Diagnosis not present

## 2021-07-09 DIAGNOSIS — F325 Major depressive disorder, single episode, in full remission: Secondary | ICD-10-CM | POA: Diagnosis not present

## 2021-07-09 DIAGNOSIS — I10 Essential (primary) hypertension: Secondary | ICD-10-CM | POA: Diagnosis not present

## 2021-07-15 DIAGNOSIS — M25512 Pain in left shoulder: Secondary | ICD-10-CM | POA: Diagnosis not present

## 2021-08-16 DIAGNOSIS — Z961 Presence of intraocular lens: Secondary | ICD-10-CM | POA: Diagnosis not present

## 2021-08-16 DIAGNOSIS — H5213 Myopia, bilateral: Secondary | ICD-10-CM | POA: Diagnosis not present

## 2021-08-30 DIAGNOSIS — R208 Other disturbances of skin sensation: Secondary | ICD-10-CM | POA: Diagnosis not present

## 2021-08-30 DIAGNOSIS — L814 Other melanin hyperpigmentation: Secondary | ICD-10-CM | POA: Diagnosis not present

## 2021-08-30 DIAGNOSIS — L570 Actinic keratosis: Secondary | ICD-10-CM | POA: Diagnosis not present

## 2021-08-30 DIAGNOSIS — D224 Melanocytic nevi of scalp and neck: Secondary | ICD-10-CM | POA: Diagnosis not present

## 2021-08-30 DIAGNOSIS — L821 Other seborrheic keratosis: Secondary | ICD-10-CM | POA: Diagnosis not present

## 2021-08-30 DIAGNOSIS — D1801 Hemangioma of skin and subcutaneous tissue: Secondary | ICD-10-CM | POA: Diagnosis not present

## 2021-09-02 DIAGNOSIS — Z1331 Encounter for screening for depression: Secondary | ICD-10-CM | POA: Diagnosis not present

## 2021-09-02 DIAGNOSIS — I10 Essential (primary) hypertension: Secondary | ICD-10-CM | POA: Diagnosis not present

## 2021-09-02 DIAGNOSIS — M810 Age-related osteoporosis without current pathological fracture: Secondary | ICD-10-CM | POA: Diagnosis not present

## 2021-09-02 DIAGNOSIS — K9089 Other intestinal malabsorption: Secondary | ICD-10-CM | POA: Diagnosis not present

## 2021-09-02 DIAGNOSIS — F325 Major depressive disorder, single episode, in full remission: Secondary | ICD-10-CM | POA: Diagnosis not present

## 2021-09-02 DIAGNOSIS — K219 Gastro-esophageal reflux disease without esophagitis: Secondary | ICD-10-CM | POA: Diagnosis not present

## 2021-09-02 DIAGNOSIS — Z79899 Other long term (current) drug therapy: Secondary | ICD-10-CM | POA: Diagnosis not present

## 2021-09-02 DIAGNOSIS — Z0000 Encounter for general adult medical examination without abnormal findings: Secondary | ICD-10-CM | POA: Diagnosis not present

## 2021-09-02 DIAGNOSIS — J4520 Mild intermittent asthma, uncomplicated: Secondary | ICD-10-CM | POA: Diagnosis not present

## 2021-09-02 DIAGNOSIS — I700 Atherosclerosis of aorta: Secondary | ICD-10-CM | POA: Diagnosis not present

## 2021-09-07 DIAGNOSIS — M5416 Radiculopathy, lumbar region: Secondary | ICD-10-CM | POA: Diagnosis not present

## 2021-09-08 DIAGNOSIS — F325 Major depressive disorder, single episode, in full remission: Secondary | ICD-10-CM | POA: Diagnosis not present

## 2021-09-08 DIAGNOSIS — I10 Essential (primary) hypertension: Secondary | ICD-10-CM | POA: Diagnosis not present

## 2021-09-08 DIAGNOSIS — M810 Age-related osteoporosis without current pathological fracture: Secondary | ICD-10-CM | POA: Diagnosis not present

## 2021-09-08 DIAGNOSIS — K219 Gastro-esophageal reflux disease without esophagitis: Secondary | ICD-10-CM | POA: Diagnosis not present

## 2021-09-08 DIAGNOSIS — J4520 Mild intermittent asthma, uncomplicated: Secondary | ICD-10-CM | POA: Diagnosis not present

## 2021-09-14 DIAGNOSIS — R159 Full incontinence of feces: Secondary | ICD-10-CM | POA: Diagnosis not present

## 2021-09-14 DIAGNOSIS — Z01419 Encounter for gynecological examination (general) (routine) without abnormal findings: Secondary | ICD-10-CM | POA: Diagnosis not present

## 2021-10-04 DIAGNOSIS — M810 Age-related osteoporosis without current pathological fracture: Secondary | ICD-10-CM | POA: Diagnosis not present

## 2021-10-04 DIAGNOSIS — J4520 Mild intermittent asthma, uncomplicated: Secondary | ICD-10-CM | POA: Diagnosis not present

## 2021-10-04 DIAGNOSIS — F325 Major depressive disorder, single episode, in full remission: Secondary | ICD-10-CM | POA: Diagnosis not present

## 2021-10-04 DIAGNOSIS — K219 Gastro-esophageal reflux disease without esophagitis: Secondary | ICD-10-CM | POA: Diagnosis not present

## 2021-10-04 DIAGNOSIS — I10 Essential (primary) hypertension: Secondary | ICD-10-CM | POA: Diagnosis not present

## 2021-10-23 DIAGNOSIS — C50919 Malignant neoplasm of unspecified site of unspecified female breast: Secondary | ICD-10-CM

## 2021-10-23 HISTORY — DX: Malignant neoplasm of unspecified site of unspecified female breast: C50.919

## 2021-10-25 ENCOUNTER — Ambulatory Visit
Admission: RE | Admit: 2021-10-25 | Discharge: 2021-10-25 | Disposition: A | Discharge status: Home / Self Care | Payer: PPO | Source: Ambulatory Visit | Attending: Gastroenterology | Admitting: Gastroenterology

## 2021-10-25 ENCOUNTER — Other Ambulatory Visit: Payer: Self-pay | Admitting: Gastroenterology

## 2021-10-25 DIAGNOSIS — R195 Other fecal abnormalities: Secondary | ICD-10-CM

## 2021-10-25 DIAGNOSIS — R197 Diarrhea, unspecified: Secondary | ICD-10-CM | POA: Diagnosis not present

## 2021-10-25 DIAGNOSIS — R159 Full incontinence of feces: Secondary | ICD-10-CM | POA: Diagnosis not present

## 2021-10-29 DIAGNOSIS — R195 Other fecal abnormalities: Secondary | ICD-10-CM | POA: Diagnosis not present

## 2021-10-29 DIAGNOSIS — R159 Full incontinence of feces: Secondary | ICD-10-CM | POA: Diagnosis not present

## 2021-11-01 DIAGNOSIS — H43812 Vitreous degeneration, left eye: Secondary | ICD-10-CM | POA: Diagnosis not present

## 2021-11-03 DIAGNOSIS — M810 Age-related osteoporosis without current pathological fracture: Secondary | ICD-10-CM | POA: Diagnosis not present

## 2021-11-03 DIAGNOSIS — J4520 Mild intermittent asthma, uncomplicated: Secondary | ICD-10-CM | POA: Diagnosis not present

## 2021-11-03 DIAGNOSIS — I10 Essential (primary) hypertension: Secondary | ICD-10-CM | POA: Diagnosis not present

## 2021-11-03 DIAGNOSIS — K219 Gastro-esophageal reflux disease without esophagitis: Secondary | ICD-10-CM | POA: Diagnosis not present

## 2021-11-03 DIAGNOSIS — F325 Major depressive disorder, single episode, in full remission: Secondary | ICD-10-CM | POA: Diagnosis not present

## 2021-11-04 DIAGNOSIS — Z1231 Encounter for screening mammogram for malignant neoplasm of breast: Secondary | ICD-10-CM | POA: Diagnosis not present

## 2021-11-10 ENCOUNTER — Non-Acute Institutional Stay: Payer: PPO | Admitting: Internal Medicine

## 2021-11-10 ENCOUNTER — Encounter: Payer: Self-pay | Admitting: Internal Medicine

## 2021-11-10 VITALS — BP 140/90 | HR 63 | Temp 97.1°F | Ht 62.5 in | Wt 131.8 lb

## 2021-11-10 DIAGNOSIS — M545 Low back pain, unspecified: Secondary | ICD-10-CM

## 2021-11-10 DIAGNOSIS — F5101 Primary insomnia: Secondary | ICD-10-CM | POA: Diagnosis not present

## 2021-11-10 DIAGNOSIS — R200 Anesthesia of skin: Secondary | ICD-10-CM

## 2021-11-10 DIAGNOSIS — I10 Essential (primary) hypertension: Secondary | ICD-10-CM | POA: Diagnosis not present

## 2021-11-10 DIAGNOSIS — R252 Cramp and spasm: Secondary | ICD-10-CM | POA: Diagnosis not present

## 2021-11-10 DIAGNOSIS — A0472 Enterocolitis due to Clostridium difficile, not specified as recurrent: Secondary | ICD-10-CM

## 2021-11-10 DIAGNOSIS — G8929 Other chronic pain: Secondary | ICD-10-CM

## 2021-11-10 DIAGNOSIS — R922 Inconclusive mammogram: Secondary | ICD-10-CM | POA: Diagnosis not present

## 2021-11-10 DIAGNOSIS — K219 Gastro-esophageal reflux disease without esophagitis: Secondary | ICD-10-CM

## 2021-11-10 DIAGNOSIS — R921 Mammographic calcification found on diagnostic imaging of breast: Secondary | ICD-10-CM | POA: Diagnosis not present

## 2021-11-10 DIAGNOSIS — R053 Chronic cough: Secondary | ICD-10-CM

## 2021-11-10 DIAGNOSIS — R928 Other abnormal and inconclusive findings on diagnostic imaging of breast: Secondary | ICD-10-CM | POA: Diagnosis not present

## 2021-11-10 NOTE — Progress Notes (Signed)
Location:  [facility] of Service:  Clinic (12)  Provider:   Code Status:  Goals of Care:     11/10/2021    8:47 AM  Advanced Directives  Does Patient Have a Medical Advance Directive? Yes  Type of Advance Directive Living will;Out of facility DNR (pink MOST or yellow form)  Does patient want to make changes to medical advance directive? No - Patient declined     Chief Complaint  Patient presents with   Establish Care    New patient establishing care.    HPI: Patient is a 88 y.o. female seen today for medical management of chronic diseases.   Patient's active problems Recent diagnosis of C. difficile colitis Patient was treated with antibiotics by her dentist in February for infection in her implant. She started having diarrhea in end of March. That diagnosed with C. difficile recently by [facility].  She is on vancomycin for total of 14 days. Patient said that his diarrhea has improved but her symptoms are very unpredictable.  She had to cut back on her socializing and going out has to stay in her apartment.  She is also using Imodium as needed.  Chronic cough Had seen allergist in past.  She was told that she probably has mild asthma.  Was started on as needed albuterol.  Also is on Flonase.  But patient continues to have cough and has not seen any improvement.  She states her cough is mostly dry she denies any shortness of breath.  Does have rhinitis. History of tick exposures Patient has found multiple ticks on her recently when she has been working in her garden outside. Muscle cramps Complaints of pain especially at night Back pain Sees Dr. Herma Mering and gets injection every 4 months.  Her pain is very well controlled by this Numbness in her legs has seen Dr. Jannifer Franklin before Insomnia Uses Remeron. GERD On Dexilant per GI  Husband was Physician passed away Has son in [place]  Past Medical History:  Diagnosis Date   Depression     Diverticulosis    Esophageal reflux    Hiatal hernia    Hx of colonic polyps    Hypertension    Per new patient packet   Mild asthma    Per new patient packet   Osteoarthritis    Osteopenia    bone density 1-09 and 2-13   Pulmonary nodules    scattered-CT Scan July 2010, 12-10, and 04-2010-all stable felt benign     Past Surgical History:  Procedure Laterality Date   CHOLECYSTECTOMY  04/25/1972   Gall Bladder; [facility]   DG PORTABLE CHEST X RAY ([facility]X)  2023   Shoulder   DIAGNOSTIC MAMMOGRAM  2022   [facility]; Per new patient packet   JOINT REPLACEMENT Left 08/24/2010   JOINT REPLACEMENT Left 04/25/2010   Reverse    orthroscopic  Rotator Cuff Left 04/25/2009   Partial shoulder Left 04/26/2003   ROTATOR CUFF REPAIR     x 2 1998 and 1999   SPINE SURGERY  04/26/1983   disk   TONSILLECTOMY  04/25/1950   Dr. Nyoka Cowden; Per new patient packet    Allergies  Allergen Reactions   Sulfa Antibiotics     Outpatient Encounter Medications as of 11/10/2021  Medication Sig   acetaminophen (TYLENOL) 500 MG tablet Take 500 mg by mouth as needed.   ALBUTEROL SULFATE IN Inhale into the lungs daily.   amoxicillin (AMOXIL) 500 MG tablet  Take 500 mg by mouth. "4 before dental appointment" Per new patient packet   atenolol (TENORMIN) 25 MG tablet Take 25 mg by mouth daily.   Cholecalciferol (D3 PO) Take 250 mcg by mouth daily.   Cyanocobalamin (B-12) 1000 MCG TABS Take by mouth daily.   dexlansoprazole (DEXILANT) 60 MG capsule Take 60 mg by mouth daily.   diclofenac Sodium (VOLTAREN) 1 % GEL Apply topically as needed.   fluticasone (FLONASE ALLERGY RELIEF) 50 MCG/ACT nasal spray Place into both nostrils daily.   lidocaine (SALONPAS PAIN RELIEVING) 4 % Place onto the skin as needed.   mirtazapine (REMERON) 15 MG tablet Take 15 mg by mouth daily.   [DISCONTINUED] aspirin 81 MG tablet Take 81 mg by mouth daily.   [DISCONTINUED] calcium carbonate (TUMS - DOSED IN MG ELEMENTAL CALCIUM) 500 MG  chewable tablet Chew by mouth every other day.   [DISCONTINUED] Calcium Carbonate-Vitamin D 600-400 MG-UNIT per tablet Take 1 tablet by mouth daily.   [DISCONTINUED] DEXILANT 60 MG capsule    [DISCONTINUED] fluocinonide cream (LIDEX) 0.05 % continuous as needed.   [DISCONTINUED] LIDODERM 5 % 1 patch continuous as needed. To intact skin remove after 12 hours once a day   [DISCONTINUED] LORazepam (ATIVAN) 0.5 MG tablet Take 0.5 mg by mouth.   [DISCONTINUED] omeprazole-sodium bicarbonate (ZEGERID) 40-1100 MG per capsule Take 1 tablet by mouth daily.   [DISCONTINUED] triamcinolone (KENALOG) 0.1 % paste Place 1 application onto teeth continuous as needed.    No facility-administered encounter medications on file as of 11/10/2021.    Review of Systems:  Review of Systems  Constitutional:  Negative for activity change and appetite change.  HENT:  Positive for postnasal drip and rhinorrhea.   Respiratory:  Positive for cough. Negative for shortness of breath.   Cardiovascular:  Negative for leg swelling.  Gastrointestinal:  Negative for constipation.  Genitourinary: Negative.   Musculoskeletal:  Negative for arthralgias, gait problem and myalgias.  Skin: Negative.   Neurological:  Negative for dizziness and weakness.  Psychiatric/Behavioral:  Negative for confusion, dysphoric mood and sleep disturbance.     Health Maintenance  Topic Date Due   Pneumonia Vaccine 48+ Years old (1 - PCV) Never done   COVID-19 Vaccine (4 - Booster for Moderna series) 04/02/2021   INFLUENZA VACCINE  11/23/2021   TETANUS/TDAP  08/26/2030   DEXA SCAN  Completed   Zoster Vaccines- Shingrix  Completed   HPV VACCINES  Aged Out    Physical Exam: Vitals:   11/10/21 0831  BP: 140/90  Pulse: 63  Temp: (!) 97.1 F (36.2 C)  TempSrc: Temporal  SpO2: 98%  Weight: 131 lb 12.8 oz (59.8 kg)  Height: 5' 2.5" (1.588 m)   Body mass index is 23.72 kg/m. Physical Exam Vitals reviewed.  Constitutional:       Appearance: Normal appearance.  HENT:     Head: Normocephalic.     Nose: Congestion present.     Mouth/Throat:     Mouth: Mucous membranes are moist.     Pharynx: Oropharynx is clear.  Eyes:     Pupils: Pupils are equal, round, and reactive to light.  Cardiovascular:     Rate and Rhythm: Normal rate and regular rhythm.     Pulses: Normal pulses.     Heart sounds: Normal heart sounds. No murmur heard. Pulmonary:     Effort: Pulmonary effort is normal.     Breath sounds: Normal breath sounds.  Abdominal:     General: Abdomen is flat.  Bowel sounds are normal.     Palpations: Abdomen is soft.  Musculoskeletal:        General: No swelling.     Cervical back: Neck supple.  Skin:    General: Skin is warm.  Neurological:     General: No focal deficit present.     Mental Status: She is alert and oriented to person, place, and time.  Psychiatric:        Mood and Affect: Mood normal.        Thought Content: Thought content normal.     Labs reviewed: Basic Metabolic Panel: Recent Labs    01/14/21 2003  NA 138  K 4.3  CL 101  CO2 27  GLUCOSE 125*  BUN 10  CREATININE 0.62  CALCIUM 9.6   Liver Function Tests: No results for input(s): "AST", "ALT", "ALKPHOS", "BILITOT", "PROT", "ALBUMIN" in the last 8760 hours. No results for input(s): "LIPASE", "AMYLASE" in the last 8760 hours. No results for input(s): "AMMONIA" in the last 8760 hours. CBC: Recent Labs    01/14/21 2003  WBC 7.8  NEUTROABS 5.2  HGB 14.3  HCT 42.7  MCV 82.8  PLT 278   Lipid Panel: No results for input(s): "CHOL", "HDL", "LDLCALC", "TRIG", "CHOLHDL", "LDLDIRECT" in the last 8760 hours. No results found for: "HGBA1C"  Procedures since last visit: DG Abd 1 View  Result Date: 10/27/2021 CLINICAL DATA:  Comment on stool diarrhea since several months EXAM: ABDOMEN - 1 VIEW COMPARISON:  None Available. FINDINGS: The bowel gas pattern is nonspecific and nonobstructive. No radio-opaque calculi or other  significant radiographic abnormality are seen except for phleboliths in the pelvis. Mild rotoscoliosis of the thoracolumbar spine and mild thoracolumbar spondylosis. IMPRESSION: Bowel-gas pattern is nonspecific and nonobstructive. No pneumatosis intestinalis. Electronically Signed   By: Frazier Richards M.D.   On: 10/27/2021 08:48    Assessment/Plan  1. C. difficile colitis On Vanco for 14 days per [facility] Follow up with them Take Imodium PRN  2. Muscle cramps Will check Mag Level Stretching Exercises Has seen Neurology before  3. Cough, persistent Has seen Allergist On Flonase also on Dexilant  4. Numbness No Further work up per Neurology  5. Chronic low back pain, unspecified back pain laterality, unspecified whether sciatica present Follows with Dr Nelva Bush  6. Gastroesophageal reflux disease, unspecified whether esophagitis present On Dexilant Use to have Chronic Chest Pain  7. Primary insomnia Doing well with Remeron  8. Primary hypertension On Toprol Monitors her BP at home  Labs/tests ordered:  Lipoid , TSH,CBC,CMP Mag level Next appt:  03/16/2022  Need DEXA,Mammogram and Vaccine records from previous PCP

## 2021-11-10 NOTE — Patient Instructions (Addendum)
Take Flonase everyday for few days to see if it helps your cough Someone will call you to schedule for Labs

## 2021-11-16 ENCOUNTER — Other Ambulatory Visit: Payer: Self-pay | Admitting: Radiology

## 2021-11-16 DIAGNOSIS — D0512 Intraductal carcinoma in situ of left breast: Secondary | ICD-10-CM | POA: Diagnosis not present

## 2021-11-18 ENCOUNTER — Encounter: Payer: Self-pay | Admitting: Internal Medicine

## 2021-11-18 ENCOUNTER — Telehealth: Payer: Self-pay | Admitting: Hematology

## 2021-11-18 DIAGNOSIS — Z79899 Other long term (current) drug therapy: Secondary | ICD-10-CM | POA: Diagnosis not present

## 2021-11-18 LAB — BASIC METABOLIC PANEL
BUN: 13 (ref 4–21)
CO2: 24 — AB (ref 13–22)
Chloride: 97 — AB (ref 99–108)
Creatinine: 0.6 (ref 0.5–1.1)
Glucose: 61
Potassium: 4.5 mEq/L (ref 3.5–5.1)
Sodium: 134 — AB (ref 137–147)

## 2021-11-18 LAB — LIPID PANEL
Cholesterol: 152 (ref 0–200)
HDL: 55 (ref 35–70)
LDL Cholesterol: 70
Triglycerides: 136 (ref 40–160)

## 2021-11-18 LAB — CBC AND DIFFERENTIAL
Hemoglobin: 13.7 (ref 12.0–16.0)
Platelets: 328 10*3/uL (ref 150–400)
WBC: 7.5

## 2021-11-18 LAB — HEPATIC FUNCTION PANEL
ALT: 18 U/L (ref 7–35)
AST: 14 (ref 13–35)
Alkaline Phosphatase: 67 (ref 25–125)
Bilirubin, Total: 0.3

## 2021-11-18 LAB — COMPREHENSIVE METABOLIC PANEL
Calcium: 9.1 (ref 8.7–10.7)
eGFR: 85

## 2021-11-18 LAB — CBC: RBC: 4.79 (ref 3.87–5.11)

## 2021-11-18 LAB — TSH: TSH: 1.41 (ref 0.41–5.90)

## 2021-11-18 NOTE — Telephone Encounter (Signed)
Spoke to patient to confirm upcoming clinic appointment for 8/2, [facility] will send paperwork

## 2021-11-19 ENCOUNTER — Encounter: Payer: Self-pay | Admitting: Geriatric Medicine

## 2021-11-22 ENCOUNTER — Telehealth: Payer: Self-pay | Admitting: Hematology

## 2021-11-22 ENCOUNTER — Encounter: Payer: Self-pay | Admitting: *Deleted

## 2021-11-22 DIAGNOSIS — D0512 Intraductal carcinoma in situ of left breast: Secondary | ICD-10-CM | POA: Insufficient documentation

## 2021-11-22 NOTE — Telephone Encounter (Signed)
Got a call from Radiation Oncology that patient had a question about her appointment time, I called patient to reassure her that her appointment time was correct that all her appointments just wasn't in her chart yet

## 2021-11-23 NOTE — Progress Notes (Signed)
Radiation Oncology         [phone] ________________________________  Multidisciplinary Breast Oncology Clinic [facility]) Initial Outpatient Consultation  Name: Tammy Lindsey MRN: [id number]  Date: 11/24/2021  DOB: November 16, 1931  JJ:OACZY, Rene Kocher, MD  Jovita Kussmaul, MD   REFERRING PHYSICIAN: Autumn Messing III, MD  DIAGNOSIS: There were no encounter diagnoses.  Stage *** Left Breast ***Q, Intermediate grade DCIS, ER+ / PR+ / Her2 not assessed  No diagnosis found.  HISTORY OF PRESENT ILLNESS::Tammy Lindsey is a 88 y.o. female who is presenting to the office today for evaluation of her newly diagnosed breast cancer. She is accompanied by ***. She is doing well overall.   She had routine screening mammography on 11/04/21 showing a possible abnormality in the left breast. She underwent left breast diagnostic mammography with tomography at [facility] on 11/10/21 showing: a 1.3 cm group of calcifications in the left breast concerning for malignancy.  Biopsy of the central-6 o'clock right breast on 11/16/21 showed: intermediate grade DCIS with necrosis and calcifications measuring 0.6 cm in the greatest dimension. Prognostic indicators significant for: estrogen receptor, 100% positive and progesterone receptor, 70% positive, both with strong staining intensity. HER2 not assessed.  Menarche: *** years old Age at first live birth: *** years old GP: *** LMP: *** Contraceptive: *** HRT: ***   The patient was referred today for presentation in the multidisciplinary conference.  Radiology studies and pathology slides were presented there for review and discussion of treatment options.  A consensus was discussed regarding potential next steps.  PREVIOUS RADIATION THERAPY: {EXAM; YES/NO:19492::"No"}  PAST MEDICAL HISTORY:  Past Medical History:  Diagnosis Date   Depression    Diverticulosis    Esophageal reflux    Hiatal hernia    Hx of colonic polyps    Hypertension    Per new patient packet    Mild asthma    Per new patient packet   Osteoarthritis    Osteopenia    bone density 1-09 and 2-13   Pulmonary nodules    scattered-CT Scan July 2010, 12-10, and 04-2010-all stable felt benign     PAST SURGICAL HISTORY: Past Surgical History:  Procedure Laterality Date   CHOLECYSTECTOMY  04/25/1972   Gall Bladder; [facility]   DG PORTABLE CHEST X RAY ([place] HX)  2023   Shoulder   DIAGNOSTIC MAMMOGRAM  2022   [facility]; Per new patient packet   JOINT REPLACEMENT Left 08/24/2010   JOINT REPLACEMENT Left 04/25/2010   Reverse    orthroscopic  Rotator Cuff Left 04/25/2009   Partial shoulder Left 04/26/2003   ROTATOR CUFF REPAIR     x 2 1998 and 1999   SPINE SURGERY  04/26/1983   disk   TONSILLECTOMY  04/25/1950   Dr. Nyoka Cowden; Per new patient packet    FAMILY HISTORY:  Family History  Problem Relation Age of Onset   Cancer Mother        cervical   Alzheimer's disease Mother    Asthma Father    Other Father        Cerebral hemorrhage   Heart disease Sister        Heart disease before age 78   Hypertension Sister    Stroke Maternal Grandfather    Heart attack Maternal Grandfather    Cancer Daughter        Breast    Asthma Son    Cancer Cousin    Neuropathy Neg Hx     SOCIAL HISTORY:  Social  History   Socioeconomic History   Marital status: Widowed    Spouse name: Not on file   Number of children: 2   Years of education: Not on file   Highest education level: Not on file  Occupational History   Occupation: Retired  Tobacco Use   Smoking status: Never   Smokeless tobacco: Never  Vaping Use   Vaping Use: Never used  Substance and Sexual Activity   Alcohol use: Yes    Alcohol/week: 4.0 standard drinks of alcohol    Types: 4 Glasses of wine per week    Comment: 4 glasses of wine a week   Drug use: No   Sexual activity: Not on file  Other Topics Concern   Not on file  Social History Narrative   Diet: Left blank      Caffeine: Yes      Married, if yes  what year: Widow; married in [place] you live in a house, apartment, assisted living, condo, trailer, ect: [employer] retirement       Is it one or more stories: [employer]       How many persons live in your home? One      Pets: Cat      Highest level or education completed: Left blank      Current/Past profession: Artist       Exercise: Yes                 Type and how often: Walk         Living Will: Yes   DNR: Yes    POA/HPOA: Left blank       Functional Status:   Do you have difficulty bathing or dressing yourself? Left blank    Do you have difficulty preparing food or eating? Left blank    Do you have difficulty managing your medications? Left blank    Do you have difficulty managing your finances? Left blank    Do you have difficulty affording your medications? Left blank    Social Determinants of Health   Financial Resource Strain: Not on file  Food Insecurity: Not on file  Transportation Needs: Not on file  Physical Activity: Not on file  Stress: Not on file  Social Connections: Not on file    ALLERGIES:  Allergies  Allergen Reactions   Sulfa Antibiotics     MEDICATIONS:  Current Outpatient Medications  Medication Sig Dispense Refill   acetaminophen (TYLENOL) 500 MG tablet Take 500 mg by mouth as needed.     ALBUTEROL SULFATE IN Inhale into the lungs daily.     amoxicillin (AMOXIL) 500 MG tablet Take 500 mg by mouth. "4 before dental appointment" Per new patient packet     atenolol (TENORMIN) 25 MG tablet Take 25 mg by mouth daily.     Cholecalciferol (D3 PO) Take 250 mcg by mouth daily.     Cyanocobalamin (B-12) 1000 MCG TABS Take by mouth daily.     dexlansoprazole (DEXILANT) 60 MG capsule Take 60 mg by mouth daily.     diclofenac Sodium (VOLTAREN) 1 % GEL Apply topically as needed.     fluticasone (FLONASE ALLERGY RELIEF) 50 MCG/ACT nasal spray Place into both nostrils daily.     lidocaine (SALONPAS PAIN RELIEVING) 4 % Place onto the skin as  needed.     mirtazapine (REMERON) 15 MG tablet Take 15 mg by mouth daily.     vancomycin (VANCOCIN) 125 MG  capsule Take 125 mg by mouth 4 (four) times daily. For 14 days     No current facility-administered medications for this encounter.    REVIEW OF SYSTEMS: A 10+ POINT REVIEW OF SYSTEMS WAS OBTAINED including neurology, dermatology, psychiatry, cardiac, respiratory, lymph, extremities, GI, GU, musculoskeletal, constitutional, reproductive, HEENT. On the provided form, she reports ***. She denies *** and any other symptoms.    PHYSICAL EXAM:  vitals were not taken for this visit.  {may need to copy over vitals} Lungs are clear to auscultation bilaterally. Heart has regular rate and rhythm. No palpable cervical, supraclavicular, or axillary adenopathy. Abdomen soft, non-tender, normal bowel sounds. Breast: *** breast with no palpable mass, nipple discharge, or bleeding. *** breast with ***.   KPS = ***  100 - Normal; no complaints; no evidence of disease. 90   - Able to carry on normal activity; minor signs or symptoms of disease. 80   - Normal activity with effort; some signs or symptoms of disease. 38   - Cares for self; unable to carry on normal activity or to do active work. 60   - Requires occasional assistance, but is able to care for most of his personal needs. 50   - Requires considerable assistance and frequent medical care. 40   - Disabled; requires special care and assistance. 89   - Severely disabled; hospital admission is indicated although death not imminent. 79   - Very sick; hospital admission necessary; active supportive treatment necessary. 10   - Moribund; fatal processes progressing rapidly. 0     - Dead  Karnofsky DA, Abelmann [place], Craver LS and Burchenal [facility] [phone]) The use of the nitrogen mustards in the palliative treatment of carcinoma: with particular reference to bronchogenic carcinoma Cancer 1 [id number]  LABORATORY DATA:  Lab Results  Component Value Date   WBC  7.5 11/18/2021   HGB 13.7 11/18/2021   HCT 42.7 01/14/2021   MCV 82.8 01/14/2021   PLT 328 11/18/2021   Lab Results  Component Value Date   NA 134 (A) 11/18/2021   K 4.5 11/18/2021   CL 97 (A) 11/18/2021   CO2 24 (A) 11/18/2021   Lab Results  Component Value Date   ALT 18 11/18/2021   AST 14 11/18/2021   ALKPHOS 67 11/18/2021    PULMONARY FUNCTION TEST:   Review Flowsheet        No data to display          RADIOGRAPHY: DG Abd 1 View  Result Date: 10/27/2021 CLINICAL DATA:  Comment on stool diarrhea since several months EXAM: ABDOMEN - 1 VIEW COMPARISON:  None Available. FINDINGS: The bowel gas pattern is nonspecific and nonobstructive. No radio-opaque calculi or other significant radiographic abnormality are seen except for phleboliths in the pelvis. Mild rotoscoliosis of the thoracolumbar spine and mild thoracolumbar spondylosis. IMPRESSION: Bowel-gas pattern is nonspecific and nonobstructive. No pneumatosis intestinalis. Electronically Signed   By: Frazier Richards M.D.   On: 10/27/2021 08:48      IMPRESSION: ***   Patient will be a good candidate for breast conservation with radiotherapy to *** breast. We discussed the general course of radiation, potential side effects, and toxicities with radiation and the patient is interested in this approach. ***   PLAN:  ***    ------------------------------------------------  Blair Promise, PhD, MD  This document serves as a record of services personally performed by Gery Pray, MD. It was created on his behalf by Roney Mans, a trained medical scribe. The  creation of this record is based on the scribe's personal observations and the provider's statements to them. This document has been checked and approved by the attending provider.

## 2021-11-24 ENCOUNTER — Inpatient Hospital Stay: Payer: PPO | Admitting: Licensed Clinical Social Worker

## 2021-11-24 ENCOUNTER — Inpatient Hospital Stay (HOSPITAL_BASED_OUTPATIENT_CLINIC_OR_DEPARTMENT_OTHER): Payer: PPO | Admitting: Hematology

## 2021-11-24 ENCOUNTER — Ambulatory Visit: Payer: PPO | Admitting: Physical Therapy

## 2021-11-24 ENCOUNTER — Other Ambulatory Visit: Payer: Self-pay

## 2021-11-24 ENCOUNTER — Inpatient Hospital Stay (HOSPITAL_BASED_OUTPATIENT_CLINIC_OR_DEPARTMENT_OTHER): Payer: PPO | Admitting: Genetic Counselor

## 2021-11-24 ENCOUNTER — Encounter: Payer: Self-pay | Admitting: Hematology

## 2021-11-24 ENCOUNTER — Ambulatory Visit: Payer: Self-pay | Admitting: General Surgery

## 2021-11-24 ENCOUNTER — Inpatient Hospital Stay: Payer: PPO | Attending: Hematology

## 2021-11-24 ENCOUNTER — Ambulatory Visit
Admission: RE | Admit: 2021-11-24 | Discharge: 2021-11-24 | Disposition: A | Discharge status: Home / Self Care | Payer: PPO | Source: Ambulatory Visit | Attending: Radiation Oncology | Admitting: Radiation Oncology

## 2021-11-24 VITALS — BP 147/60 | HR 74 | Temp 97.9°F | Resp 16 | Ht 62.0 in | Wt 129.5 lb

## 2021-11-24 DIAGNOSIS — Z79899 Other long term (current) drug therapy: Secondary | ICD-10-CM | POA: Diagnosis not present

## 2021-11-24 DIAGNOSIS — D0512 Intraductal carcinoma in situ of left breast: Secondary | ICD-10-CM | POA: Diagnosis not present

## 2021-11-24 DIAGNOSIS — Z803 Family history of malignant neoplasm of breast: Secondary | ICD-10-CM

## 2021-11-24 DIAGNOSIS — M19012 Primary osteoarthritis, left shoulder: Secondary | ICD-10-CM | POA: Diagnosis not present

## 2021-11-24 DIAGNOSIS — M19011 Primary osteoarthritis, right shoulder: Secondary | ICD-10-CM

## 2021-11-24 DIAGNOSIS — K219 Gastro-esophageal reflux disease without esophagitis: Secondary | ICD-10-CM | POA: Insufficient documentation

## 2021-11-24 DIAGNOSIS — Z170 Estrogen receptor positive status [ER+]: Secondary | ICD-10-CM

## 2021-11-24 LAB — CMP (CANCER CENTER ONLY)
ALT: 11 U/L (ref 0–44)
AST: 12 U/L — ABNORMAL LOW (ref 15–41)
Albumin: 4.4 g/dL (ref 3.5–5.0)
Alkaline Phosphatase: 54 U/L (ref 38–126)
Anion gap: 8 (ref 5–15)
BUN: 14 mg/dL (ref 8–23)
CO2: 26 mmol/L (ref 22–32)
Calcium: 9.1 mg/dL (ref 8.9–10.3)
Chloride: 99 mmol/L (ref 98–111)
Creatinine: 0.68 mg/dL (ref 0.44–1.00)
GFR, Estimated: >60 mL/min (ref >60)
Glucose, Bld: 90 mg/dL (ref 70–99)
Potassium: 4.3 mmol/L (ref 3.5–5.1)
Sodium: 133 mmol/L — ABNORMAL LOW (ref 135–145)
Total Bilirubin: 0.5 mg/dL (ref 0.3–1.2)
Total Protein: 6.9 g/dL (ref 6.5–8.1)

## 2021-11-24 LAB — CBC WITH DIFFERENTIAL (CANCER CENTER ONLY)
Abs Immature Granulocytes: 0.02 10*3/uL (ref 0.00–0.07)
Basophils Absolute: 0 10*3/uL (ref 0.0–0.1)
Basophils Relative: 1 %
Eosinophils Absolute: 0.1 10*3/uL (ref 0.0–0.5)
Eosinophils Relative: 1 %
HCT: 39.3 % (ref 36.0–46.0)
Hemoglobin: 13.7 g/dL (ref 12.0–15.0)
Immature Granulocytes: 0 %
Lymphocytes Relative: 21 %
Lymphs Abs: 1.3 10*3/uL (ref 0.7–4.0)
MCH: 28.7 pg (ref 26.0–34.0)
MCHC: 34.9 g/dL (ref 30.0–36.0)
MCV: 82.2 fL (ref 80.0–100.0)
Monocytes Absolute: 0.6 10*3/uL (ref 0.1–1.0)
Monocytes Relative: 9 %
Neutro Abs: 4.4 10*3/uL (ref 1.7–7.7)
Neutrophils Relative %: 68 %
Platelet Count: 300 10*3/uL (ref 150–400)
RBC: 4.78 MIL/uL (ref 3.87–5.11)
RDW: 13.1 % (ref 11.5–15.5)
WBC Count: 6.4 10*3/uL (ref 4.0–10.5)
nRBC: 0 % (ref 0.0–0.2)

## 2021-11-24 LAB — GENETIC SCREENING ORDER

## 2021-11-24 NOTE — Progress Notes (Signed)
[place]   [phone] Fax:([phone]) [place] Note   Patient Care Team: Virgie Dad, MD as PCP - General (Internal Medicine) Rolm Bookbinder, MD as Attending Physician (Dermatology) Christophe Louis, MD as Consulting Physician (Obstetrics and Gynecology) Luberta Mutter, MD as Consulting Physician (Ophthalmology) Mauro Kaufmann, RN as Oncology Nurse Navigator Rockwell Germany, RN as Oncology Nurse Navigator Jovita Kussmaul, MD as Consulting Physician (General Surgery) Truitt Merle, MD as Consulting Physician (Hematology) Gery Pray, MD as Consulting Physician (Radiation Oncology)  Date of Service:  11/24/2021   CHIEF COMPLAINTS/PURPOSE OF CONSULTATION:  Left Breast DCIS, ER+  REFERRING PHYSICIAN:  [facility]   ASSESSMENT & PLAN:  Tammy Lindsey is a 88 y.o. postmenopausal female with a history of arthritis  1. Left breast DCIS, grade 2, ER/PR+ -found on screening mammogram. Left diagnostic MM on 11/10/21 showed 1.3 cm calcifications. Biopsy on 11/16/21 confirmed DCIS. -I discussed her breast imaging and needle biopsy results with patient and her family members in great detail. -She is a candidate for breast conservation surgery. She has been seen by breast surgeon Dr. Marlou Starks, who recommends lumpectomy. -Her DCIS will be cured by complete surgical resection. Any form of adjuvant therapy is preventive. -Given her strongly positive ER and PR, she would potentially benefit from antiestrogen therapy, which decrease her risk of future breast cancer by ~40%. However, given her age, I do not feel strongly she needs to proceed with this or adjuvant radiation therapy. She will also see Dr. Sondra Come today to discuss the benefits of radiation therapy. -We discussed breast cancer surveillance after she completes treatment, Including annual mammogram, breast exam every 6-12 months. She lives in [place], so she is closer to [place]; I'm happy to refer her to  f/u with Dr. Benay Spice. She agrees.    PLAN:  -She will likely have lumpectomy soon -Genetic refer -referral to Dr. Benay Spice for future f/u after her surgery.    Oncology History Overview Note   Cancer Staging  Ductal carcinoma in situ (DCIS) of left breast Staging form: Breast, AJCC 8th Edition - Clinical stage from 11/16/2021: Stage 0 (cTis (DCIS), cN0, cM0, G1, ER+, PR+, HER2: Not Assessed) - Signed by Truitt Merle, MD on 11/23/2021    Ductal carcinoma in situ (DCIS) of left breast  11/10/2021 Mammogram   Exam: 3D Mammogram Diagnostic Unilateral Digital - L  IMPRESSION: The 1.3 cm group of calcifications in the left breast is suspicious of malignancy.   11/16/2021 Cancer Staging   Staging form: Breast, AJCC 8th Edition - Clinical stage from 11/16/2021: Stage 0 (cTis (DCIS), cN0, cM0, G1, ER+, PR+, HER2: Not Assessed) - Signed by Truitt Merle, MD on 11/23/2021 Stage prefix: Initial diagnosis Histologic grading system: 3 grade system   11/16/2021 Initial Biopsy   Diagnosis Breast, left, needle core biopsy, central to slightly 6 o'clock 3cmfn - DUCTAL CARCINOMA IN SITU, INTERMEDIATE NUCLEAR GRADE - GREATEST DIMENSION: 0.6 CM - NECROSIS: PRESENT - CALCIFICATIONS: PRESENT - SEE NOTE Diagnosis Note E-cadherin is positive consistent with ductal phenotype.  PROGNOSTIC INDICATORS Results: Estrogen Receptor: 100%, POSITIVE, STRONG STAINING INTENSITY Progesterone Receptor: 70%, POSITIVE, STRONG STAINING INTENSITY   11/22/2021 Initial Diagnosis   Ductal carcinoma in situ (DCIS) of left breast      HISTORY OF PRESENTING ILLNESS:  Tammy Lindsey 88 y.o. female is a here because of breast cancer. The patient was referred by [facility]. The patient presents to the clinic today accompanied by a friend.  She had routine screening mammography on 11/04/21 showing a possible abnormality in the left breast. She underwent left diagnostic mammography on 11/10/21 showing: 1.3 cm calcifications at 6  o'clock.  Biopsy on 11/16/21 showed: DCIS, intermediate grade. Prognostic indicators significant for: estrogen receptor, 100% positive and progesterone receptor, 70% positive.    Today the patient notes they felt/feeling prior/after... -she denies any breast or other concerns. -notes she recently completed antibiotics for C.diff colitis.  She has a PMHx of.... -arthritis, mainly shoulders and back -s/p cholecystectomy with appendectomy  Socially... 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 -she lives in [place], since 2010 when she moved in with her husband.  -She is now widowed. She has two children-- one daughter and one son. -she reports breast cancer in her daughter in her 93's. -never smoker -drinks about 3 glasses of wine a week.   GYN HISTORY  Menarchal: 88 years old LMP: unsure Contraceptive: HRT: yes, took briefly after menopause GP: 2, first at age 5   REVIEW OF SYSTEMS:    Constitutional: Denies fevers, chills or abnormal night sweats Eyes: Denies blurriness of vision, double vision or watery eyes Ears, nose, mouth, throat, and face: Denies mucositis or sore throat Respiratory: Denies cough, dyspnea or wheezes Cardiovascular: Denies palpitation, chest discomfort or lower extremity swelling Gastrointestinal:  Denies nausea, heartburn or change in bowel habits Skin: Denies abnormal skin rashes Lymphatics: Denies new lymphadenopathy or easy bruising Neurological:Denies numbness, tingling or new weaknesses Behavioral/Psych: Mood is stable, no new changes  All other systems were reviewed with the patient and are negative.   MEDICAL HISTORY:  Past Medical History:  Diagnosis Date   Breast cancer ([place])    Depression    Diverticulosis    Esophageal reflux    Hiatal hernia    Hx of colonic polyps    Hypertension    Per new patient packet   Mild asthma    Per new patient packet   Osteoarthritis    Osteopenia    bone density 1-09 and 2-13   Pulmonary  nodules    scattered-CT Scan July 2010, 12-10, and 04-2010-all stable felt benign     SURGICAL HISTORY: Past Surgical History:  Procedure Laterality Date   CHOLECYSTECTOMY  04/25/1972   Gall Bladder; [facility]   DG PORTABLE CHEST X RAY ([place] HX)  2023   Shoulder   DIAGNOSTIC MAMMOGRAM  2022   [facility]; Per new patient packet   JOINT REPLACEMENT Left 08/24/2010   JOINT REPLACEMENT Left 04/25/2010   Reverse    orthroscopic  Rotator Cuff Left 04/25/2009   Partial shoulder Left 04/26/2003   ROTATOR CUFF REPAIR     x 2 1998 and 1999   SPINE SURGERY  04/26/1983   disk   TONSILLECTOMY  04/25/1950   Dr. Nyoka Cowden; Per new patient packet    SOCIAL HISTORY: Social History   Socioeconomic History   Marital status: Widowed    Spouse name: Not on file   Number of children: 2   Years of education: Not on file   Highest education level: Not on file  Occupational History   Occupation: Retired  Tobacco Use   Smoking status: Never   Smokeless tobacco: Never  Vaping Use   Vaping Use: Never used  Substance and Sexual Activity   Alcohol use: Yes    Alcohol/week: 4.0 standard drinks of alcohol    Types: 4 Glasses of wine per week    Comment: 4 glasses of wine a week  Drug use: No   Sexual activity: Not on file  Other Topics Concern   Not on file  Social History Narrative   Diet: Left blank      Caffeine: Yes      Married, if yes what year: Widow; married in [place] you live in a house, apartment, assisted living, condo, trailer, ect: [employer] retirement       Is it one or more stories: [employer]       How many persons live in your home? One      Pets: Cat      Highest level or education completed: Left blank      Current/Past profession: Artist       Exercise: Yes                 Type and how often: Walk         Living Will: Yes   DNR: Yes    POA/HPOA: Left blank       Functional Status:   Do you have difficulty bathing or dressing yourself? Left blank     Do you have difficulty preparing food or eating? Left blank    Do you have difficulty managing your medications? Left blank    Do you have difficulty managing your finances? Left blank    Do you have difficulty affording your medications? Left blank    Social Determinants of Health   Financial Resource Strain: Not on file  Food Insecurity: Not on file  Transportation Needs: Not on file  Physical Activity: Not on file  Stress: Not on file  Social Connections: Not on file  Intimate Partner Violence: Not on file    FAMILY HISTORY: Family History  Problem Relation Age of Onset   Cancer Mother        cervical   Alzheimer's disease Mother    Asthma Father    Other Father        Cerebral hemorrhage   Heart disease Sister        Heart disease before age 49   Hypertension Sister    Stroke Maternal Grandfather    Heart attack Maternal Grandfather    Cancer Daughter        Breast    Asthma Son    Cancer Cousin    Neuropathy Neg Hx     ALLERGIES:  is allergic to sulfa antibiotics.  MEDICATIONS:  Current Outpatient Medications  Medication Sig Dispense Refill   acetaminophen (TYLENOL) 500 MG tablet Take 500 mg by mouth as needed.     ALBUTEROL SULFATE IN Inhale into the lungs daily.     atenolol (TENORMIN) 25 MG tablet Take 25 mg by mouth daily.     Cholecalciferol (D3 PO) Take 250 mcg by mouth daily.     Cyanocobalamin (B-12) 1000 MCG TABS Take by mouth daily.     dexlansoprazole (DEXILANT) 60 MG capsule Take 60 mg by mouth daily.     diclofenac Sodium (VOLTAREN) 1 % GEL Apply topically as needed.     fluticasone (FLONASE ALLERGY RELIEF) 50 MCG/ACT nasal spray Place into both nostrils daily.     lidocaine (SALONPAS PAIN RELIEVING) 4 % Place onto the skin as needed.     losartan (COZAAR) 50 MG tablet Take 50 mg by mouth daily.     mirtazapine (REMERON) 15 MG tablet Take 15 mg by mouth daily.     vancomycin (VANCOCIN) 125 MG capsule Take 125 mg  by mouth 4 (four) times daily. For  14 days     No current facility-administered medications for this visit.    PHYSICAL EXAMINATION: ECOG PERFORMANCE STATUS: 0 - Asymptomatic  Vitals:   11/24/21 0853  BP: (!) 147/60  Pulse: 74  Resp: 16  Temp: 97.9 F (36.6 C)  SpO2: 99%   Filed Weights   11/24/21 0853  Weight: 129 lb 8 oz (58.7 kg)    GENERAL:alert, no distress and comfortable SKIN: skin color, texture, turgor are normal, no rashes or significant lesions EYES: normal, Conjunctiva are pink and non-injected, sclera clear NECK: supple, thyroid normal size, non-tender, without nodularity LYMPH:  no palpable lymphadenopathy in the cervical, axillary  LUNGS: clear to auscultation and percussion with normal breathing effort HEART: regular rate & rhythm and no murmurs and no lower extremity edema ABDOMEN:abdomen soft, non-tender and normal bowel sounds Musculoskeletal:no cyanosis of digits and no clubbing  NEURO: alert & oriented x 3 with fluent speech, no focal motor/sensory deficits BREAST: No palpable mass, nodules or adenopathy bilaterally. Breast exam benign.  LABORATORY DATA:  I have reviewed the data as listed    Latest Ref Rng & Units 11/24/2021    8:18 AM 11/18/2021   12:00 AM 01/14/2021    8:03 PM  CBC  WBC 4.0 - 10.5 K/uL 6.4  7.5  7.8   Hemoglobin 12.0 - 15.0 g/dL 13.7  13.7  14.3   Hematocrit 36.0 - 46.0 % 39.3   42.7   Platelets 150 - 400 K/uL 300  328  278        Latest Ref Rng & Units 11/24/2021    8:18 AM 11/18/2021   12:00 AM 01/14/2021    8:03 PM  CMP  Glucose 70 - 99 mg/dL 90   125   BUN 8 - 23 mg/dL 14  13     10    Creatinine 0.44 - 1.00 mg/dL 0.68  0.6     0.62   Sodium 135 - 145 mmol/L 133  134     138   Potassium 3.5 - 5.1 mmol/L 4.3  4.5     4.3   Chloride 98 - 111 mmol/L 99  97     101   CO2 22 - 32 mmol/L 26  24     27    Calcium 8.9 - 10.3 mg/dL 9.1  9.1     9.6   Total Protein 6.5 - 8.1 g/dL 6.9     Total Bilirubin 0.3 - 1.2 mg/dL 0.5     Alkaline Phos 38 - 126 U/L 54  67        AST 15 - 41 U/L 12  14       ALT 0 - 44 U/L 11  18          This result is from an external source.     RADIOGRAPHIC STUDIES: I have personally reviewed the radiological images as listed and agreed with the findings in the report. No results found.   No orders of the defined types were placed in this encounter.   All questions were answered. The patient knows to call the clinic with any problems, questions or concerns. The total time spent in the appointment was 30 mins.     Truitt Merle, MD 11/24/2021 2:08 PM  I, Wilburn Mylar, am acting as scribe for Truitt Merle, MD.   I have reviewed the above documentation for accuracy and completeness, and I agree with the above.

## 2021-11-25 ENCOUNTER — Encounter: Payer: Self-pay | Admitting: Genetic Counselor

## 2021-11-25 DIAGNOSIS — Z803 Family history of malignant neoplasm of breast: Secondary | ICD-10-CM

## 2021-11-25 HISTORY — DX: Family history of malignant neoplasm of breast: Z80.3

## 2021-11-25 NOTE — Progress Notes (Signed)
REFERRING PROVIDER: Truitt Merle, MD [address] [place],  [place] [address]  PRIMARY PROVIDER:  Virgie Dad, MD  PRIMARY REASON FOR VISIT:  1. Ductal carcinoma in situ (DCIS) of left breast   2. Family history of breast cancer     HISTORY OF PRESENT ILLNESS:   Ms. Tammy Lindsey, a 88 y.o. female, was seen for a [facility] cancer genetics consultation at the request of Dr. Burr Medico due to a personal and family history of breast cancer.  Ms. Ditto presents to clinic today to discuss the possibility of a hereditary predisposition to cancer, to discuss genetic testing, and to further clarify her future cancer risks, as well as potential cancer risks for family members.   In July 2023, at the age of 67, Ms. 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 The treatment plan is pending.    CANCER HISTORY:  Oncology History Overview Note   Cancer Staging  Ductal carcinoma in situ (DCIS) of left breast Staging form: Breast, AJCC 8th Edition - Clinical stage from 11/16/2021: Stage 0 (cTis (DCIS), cN0, cM0, G1, ER+, PR+, HER2: Not Assessed) - Signed by Truitt Merle, MD on 11/23/2021    Ductal carcinoma in situ (DCIS) of left breast  11/10/2021 Mammogram   Exam: 3D Mammogram Diagnostic Unilateral Digital - L  IMPRESSION: The 1.3 cm group of calcifications in the left breast is suspicious of malignancy.   11/16/2021 Cancer Staging   Staging form: Breast, AJCC 8th Edition - Clinical stage from 11/16/2021: Stage 0 (cTis (DCIS), cN0, cM0, G1, ER+, PR+, HER2: Not Assessed) - Signed by Truitt Merle, MD on 11/23/2021 Stage prefix: Initial diagnosis Histologic grading system: 3 grade system   11/16/2021 Initial Biopsy   Diagnosis Breast, left, needle core biopsy, central to slightly 6 o'clock 3cmfn - DUCTAL CARCINOMA IN SITU, INTERMEDIATE NUCLEAR GRADE - GREATEST DIMENSION: 0.6 CM - NECROSIS: PRESENT - CALCIFICATIONS: PRESENT - SEE NOTE Diagnosis Note E-cadherin is  positive consistent with ductal phenotype.  PROGNOSTIC INDICATORS Results: Estrogen Receptor: 100%, POSITIVE, STRONG STAINING INTENSITY Progesterone Receptor: 70%, POSITIVE, STRONG STAINING INTENSITY   11/22/2021 Initial Diagnosis   Ductal carcinoma in situ (DCIS) of left breast      Past Medical History:  Diagnosis Date   Breast cancer ([facility])    Depression    Diverticulosis    Esophageal reflux    Family history of breast cancer 11/25/2021   Hiatal hernia    Hx of colonic polyps    Hypertension    Per new patient packet   Mild asthma    Per new patient packet   Osteoarthritis    Osteopenia    bone density 1-09 and 2-13   Pulmonary nodules    scattered-CT Scan July 2010, 12-10, and 04-2010-all stable felt benign     Past Surgical History:  Procedure Laterality Date   CHOLECYSTECTOMY  04/25/1972   Gall Bladder; [facility]   DG PORTABLE CHEST X RAY ([facility])  2023   Shoulder   DIAGNOSTIC MAMMOGRAM  2022   [facility]; Per new patient packet   JOINT REPLACEMENT Left 08/24/2010   JOINT REPLACEMENT Left 04/25/2010   Reverse    orthroscopic  Rotator Cuff Left 04/25/2009   Partial shoulder Left 04/26/2003   ROTATOR CUFF REPAIR     x 2 1998 and 1999   SPINE SURGERY  04/26/1983   disk   TONSILLECTOMY  04/25/1950   Dr. Nyoka Cowden; Per new patient packet    FAMILY  HISTORY:  We obtained a detailed, 4-generation family history.  Significant diagnoses are listed below: Family History  Problem Relation Age of Onset   Breast cancer Daughter 47   Cancer Cousin        dx early 70s; unknown type; paternal female cousin    Ms. Hnat is unaware of previous family history of genetic testing for hereditary cancer risks.  Her daughter was unavailable for genetic testing at this time. There is no reported Ashkenazi Jewish ancestry. There is no known consanguinity.  GENETIC COUNSELING ASSESSMENT: Ms. Bermingham is a 88 y.o. female with a personal and family history of cancer which is somewhat  suggestive of a hereditary cancer syndrome and predisposition to cancer given the presence of breast cancer in multiple generations. We, therefore, discussed and recommended the following at today's visit.   DISCUSSION: We discussed that 5 - 10% of cancer is hereditary.  Most cases of hereditary breast cancer are associated with mutations in BRCA1/2.  There are other genes that can be associated with hereditary breast cancer syndromes.  We discussed that testing is beneficial for several reasons including knowing how to follow individuals for their cancer risks and understanding if other family members could be at risk for cancer and allowing them to undergo genetic testing.   We reviewed the characteristics, features and inheritance patterns of hereditary cancer syndromes. We also discussed genetic testing, including the appropriate family members to test, the process of testing, insurance coverage and turn-around-time for results. We discussed the implications of a negative, positive, carrier and/or variant of uncertain significant result. We recommended Ms. Smeltzer pursue genetic testing for a panel that includes genes associated with breast and other cancer.   The CustomNext-Cancer+RNAinsight panel offered by [facility] includes sequencing and rearrangement analysis for the following 47 genes:  APC, ATM, AXIN2, BARD1, BMPR1A, BRCA1, BRCA2, BRIP1, CDH1, CDK4, CDKN2A, CHEK2, DICER1, EPCAM, GREM1, HOXB13, MEN1, MLH1, MSH2, MSH3, MSH6, MUTYH, NBN, NF1, NF2, NTHL1, PALB2, PMS2, POLD1, POLE, PTEN, RAD51C, RAD51D, RECQL, RET, SDHA, SDHAF2, SDHB, SDHC, SDHD, SMAD4, SMARCA4, STK11, TP53, TSC1, TSC2, and VHL.  RNA data is routinely analyzed for use in variant interpretation for all genes.  Based on Ms. Yzaguirre's personal and family history of cancer, she meets medical criteria for genetic testing. Despite that she meets criteria, she may still have an out of pocket cost. We discussed that if her out of pocket cost  for testing is over $100, the laboratory should contact her and discuss the self-pay prices and/or patient pay assistance programs.    PLAN: After considering the risks, benefits, and limitations, Ms. Trivett provided informed consent to pursue genetic testing and the blood sample was sent to [facility] for analysis of the CustomNext-Cancer +RNAinsight Panel. Results should be available within approximately 3 weeks' time, at which point they will be disclosed by telephone to Ms. Brickel, as will any additional recommendations warranted by these results. Ms. Schroeter will receive a summary of her genetic counseling visit and a copy of her results once available. This information will also be available in Epic.    Ms. Galiano's questions were answered to her satisfaction today. Our contact information was provided should additional questions or concerns arise. Thank you for the referral and allowing us to share in the care of your patient.   Cari M. Koerner, MS, LCGC Genetic Counselor [email or web address] (P) [phone]  The patient was seen for a total of 20 minutes in face-to-face genetic counseling.  The patient was accompanied by her   friend. Drs. Gudena and/or Feng were available to discuss this case as needed.    _______________________________________________________________________ For Office Staff:  Number of people involved in session: 2 Was an Intern/ student involved with case: no  

## 2021-11-26 ENCOUNTER — Encounter: Payer: Self-pay | Admitting: *Deleted

## 2021-11-26 ENCOUNTER — Telehealth: Payer: Self-pay | Admitting: *Deleted

## 2021-11-26 DIAGNOSIS — D0512 Intraductal carcinoma in situ of left breast: Secondary | ICD-10-CM

## 2021-11-26 DIAGNOSIS — R195 Other fecal abnormalities: Secondary | ICD-10-CM | POA: Diagnosis not present

## 2021-11-26 NOTE — Telephone Encounter (Signed)
Spoke to pt concerning [place] from 11/24/21. Denies questions or concerns regarding dx or treatment care plan. Encourage pt to call with needs. Received verbal understanding.  Pt lives closer to [facility] and has requested to see Dr. Benay Spice. Referral placed

## 2021-11-26 NOTE — Progress Notes (Signed)
PATIENT NAVIGATOR PROGRESS NOTE  Name: Tammy Lindsey Date: 11/26/2021 MRN: [id number]  DOB: Sep 04, 1931   Reason for visit:  Introductory phone call  Comments:  Spoke with patient and discussed New patient appt with Dr Benay Spice for 12/20/21 at 1020. Discussed directions and parking as well as one support person allowed. Verbalized understanding. Pt given contact information to call with any issues or questions    Time spent counseling/coordinating care: 30-45 minutes

## 2021-12-01 ENCOUNTER — Encounter (HOSPITAL_BASED_OUTPATIENT_CLINIC_OR_DEPARTMENT_OTHER): Payer: Self-pay | Admitting: General Surgery

## 2021-12-01 ENCOUNTER — Other Ambulatory Visit: Payer: Self-pay

## 2021-12-06 ENCOUNTER — Encounter (HOSPITAL_BASED_OUTPATIENT_CLINIC_OR_DEPARTMENT_OTHER): Payer: Self-pay | Admitting: General Surgery

## 2021-12-06 DIAGNOSIS — C50812 Malignant neoplasm of overlapping sites of left female breast: Secondary | ICD-10-CM | POA: Diagnosis not present

## 2021-12-06 NOTE — Progress Notes (Signed)
Sent text reminding pt to come pick up pre surgery drink and a soap today.

## 2021-12-06 NOTE — Progress Notes (Signed)
Ensure presurgery drink given with instruction to complete DOS by 0400. CHG soap given with instruction to shower night before/AM of sx. Verbal and written instruction given. Pt verbalized understanding     Enhanced Recovery after Surgery for Orthopedics Enhanced Recovery after Surgery is a protocol used to improve the stress on your body and your recovery after surgery.  Patient Instructions  The night before surgery:  No food after midnight. ONLY clear liquids after midnight  The day of surgery (if you do NOT have diabetes):  Drink ONE (1) Pre-Surgery Clear Ensure as directed.   This drink was given to you during your hospital  pre-op appointment visit. The pre-op nurse will instruct you on the time to drink the  Pre-Surgery Ensure depending on your surgery time. Finish the drink at the designated time by the pre-op nurse.  Nothing else to drink after completing the  Pre-Surgery Clear Ensure.  The day of surgery (if you have diabetes): Drink ONE (1) Gatorade 2 (G2) as directed. This drink was given to you during your hospital  pre-op appointment visit.  The pre-op nurse will instruct you on the time to drink the   Gatorade 2 (G2) depending on your surgery time. Color of the Gatorade may vary. Red is not allowed. Nothing else to drink after completing the  Gatorade 2 (G2).         If you have questions, please contact your surgeon's office.

## 2021-12-06 NOTE — Anesthesia Preprocedure Evaluation (Signed)
Anesthesia Evaluation  Patient identified by MRN, date of birth, ID band Patient awake    Reviewed: Allergy & Precautions, NPO status , Patient's Chart, lab work & pertinent test results, reviewed documented beta blocker date and time   Airway Mallampati: II  TM Distance: >3 FB Neck ROM: Full    Dental no notable dental hx. (+) Teeth Intact, Caps, Dental Advisory Given   Pulmonary asthma ,  Chronic cough   Pulmonary exam normal breath sounds clear to auscultation       Cardiovascular hypertension, Pt. on medications and Pt. on home beta blockers Normal cardiovascular exam Rhythm:Regular Rate:Normal     Neuro/Psych negative neurological ROS  negative psych ROS   GI/Hepatic Neg liver ROS, hiatal hernia, GERD  Medicated and Controlled,  Endo/Other  Ductal Carcinoma in situ left breast  Renal/GU negative Renal ROS  negative genitourinary   Musculoskeletal  (+) Arthritis , Osteoarthritis,  Osteopenia   Abdominal   Peds  Hematology negative hematology ROS (+)   Anesthesia Other Findings   Reproductive/Obstetrics                            Anesthesia Physical Anesthesia Plan  ASA: 2  Anesthesia Plan: General   Post-op Pain Management: Minimal or no pain anticipated   Induction: Intravenous  PONV Risk Score and Plan: 4 or greater and Treatment may vary due to age or medical condition, Ondansetron and Dexamethasone  Airway Management Planned: LMA  Additional Equipment: None  Intra-op Plan:   Post-operative Plan: Extubation in OR  Informed Consent: I have reviewed the patients History and Physical, chart, labs and discussed the procedure including the risks, benefits and alternatives for the proposed anesthesia with the patient or authorized representative who has indicated his/her understanding and acceptance.     Dental advisory given  Plan Discussed with: CRNA and  Anesthesiologist  Anesthesia Plan Comments:        Anesthesia Quick Evaluation

## 2021-12-07 ENCOUNTER — Ambulatory Visit (HOSPITAL_BASED_OUTPATIENT_CLINIC_OR_DEPARTMENT_OTHER): Payer: PPO | Admitting: Anesthesiology

## 2021-12-07 ENCOUNTER — Ambulatory Visit (HOSPITAL_BASED_OUTPATIENT_CLINIC_OR_DEPARTMENT_OTHER)
Admission: RE | Admit: 2021-12-07 | Discharge: 2021-12-07 | Disposition: A | Discharge status: Home / Self Care | Payer: PPO | Attending: General Surgery | Admitting: General Surgery

## 2021-12-07 ENCOUNTER — Encounter (HOSPITAL_BASED_OUTPATIENT_CLINIC_OR_DEPARTMENT_OTHER)
Admission: RE | Disposition: A | Discharge status: Home / Self Care | Payer: Self-pay | Source: Home / Self Care | Attending: General Surgery

## 2021-12-07 ENCOUNTER — Encounter (HOSPITAL_BASED_OUTPATIENT_CLINIC_OR_DEPARTMENT_OTHER): Payer: Self-pay | Admitting: General Surgery

## 2021-12-07 ENCOUNTER — Other Ambulatory Visit: Payer: Self-pay

## 2021-12-07 DIAGNOSIS — Z01818 Encounter for other preprocedural examination: Secondary | ICD-10-CM

## 2021-12-07 DIAGNOSIS — M8580 Other specified disorders of bone density and structure, unspecified site: Secondary | ICD-10-CM | POA: Insufficient documentation

## 2021-12-07 DIAGNOSIS — D0512 Intraductal carcinoma in situ of left breast: Secondary | ICD-10-CM | POA: Insufficient documentation

## 2021-12-07 DIAGNOSIS — K449 Diaphragmatic hernia without obstruction or gangrene: Secondary | ICD-10-CM | POA: Insufficient documentation

## 2021-12-07 DIAGNOSIS — Z170 Estrogen receptor positive status [ER+]: Secondary | ICD-10-CM | POA: Diagnosis not present

## 2021-12-07 DIAGNOSIS — K219 Gastro-esophageal reflux disease without esophagitis: Secondary | ICD-10-CM | POA: Diagnosis not present

## 2021-12-07 DIAGNOSIS — J45909 Unspecified asthma, uncomplicated: Secondary | ICD-10-CM | POA: Insufficient documentation

## 2021-12-07 DIAGNOSIS — M1990 Unspecified osteoarthritis, unspecified site: Secondary | ICD-10-CM | POA: Diagnosis not present

## 2021-12-07 DIAGNOSIS — R053 Chronic cough: Secondary | ICD-10-CM | POA: Insufficient documentation

## 2021-12-07 DIAGNOSIS — R920 Mammographic microcalcification found on diagnostic imaging of breast: Secondary | ICD-10-CM | POA: Diagnosis not present

## 2021-12-07 DIAGNOSIS — I10 Essential (primary) hypertension: Secondary | ICD-10-CM | POA: Diagnosis not present

## 2021-12-07 DIAGNOSIS — N6012 Diffuse cystic mastopathy of left breast: Secondary | ICD-10-CM | POA: Diagnosis not present

## 2021-12-07 DIAGNOSIS — Z79899 Other long term (current) drug therapy: Secondary | ICD-10-CM | POA: Insufficient documentation

## 2021-12-07 DIAGNOSIS — C50912 Malignant neoplasm of unspecified site of left female breast: Secondary | ICD-10-CM | POA: Diagnosis not present

## 2021-12-07 HISTORY — PX: BREAST LUMPECTOMY WITH RADIOACTIVE SEED LOCALIZATION: SHX6424

## 2021-12-07 SURGERY — BREAST LUMPECTOMY WITH RADIOACTIVE SEED LOCALIZATION
Anesthesia: General | Site: Breast | Laterality: Left

## 2021-12-07 MED ORDER — PROPOFOL 10 MG/ML IV BOLUS
INTRAVENOUS | Status: DC | PRN
  Administered 2021-12-07: 100 mg via INTRAVENOUS

## 2021-12-07 MED ORDER — FENTANYL CITRATE (PF) 100 MCG/2ML IJ SOLN
INTRAMUSCULAR | Status: DC | PRN
  Administered 2021-12-07: 50 ug via INTRAVENOUS

## 2021-12-07 MED ORDER — PROPOFOL 10 MG/ML IV BOLUS
INTRAVENOUS | Status: AC
Start: 2021-12-07 — End: ?
  Filled 2021-12-07: qty 20

## 2021-12-07 MED ORDER — CHLORHEXIDINE GLUCONATE CLOTH 2 % EX PADS: 6.0000 | MEDICATED_PAD | Freq: Once | CUTANEOUS | Status: DC

## 2021-12-07 MED ORDER — ONDANSETRON HCL 4 MG/2ML IJ SOLN
INTRAMUSCULAR | Status: DC | PRN
  Administered 2021-12-07: 4 mg via INTRAVENOUS

## 2021-12-07 MED ORDER — LACTATED RINGERS IV SOLN: INTRAVENOUS | Status: DC

## 2021-12-07 MED ORDER — ACETAMINOPHEN 500 MG PO TABS
ORAL_TABLET | ORAL | Status: AC
  Filled 2021-12-07: qty 2

## 2021-12-07 MED ORDER — GABAPENTIN 100 MG PO CAPS
100.0000 mg | ORAL_CAPSULE | ORAL | Status: AC
  Administered 2021-12-07: 100 mg via ORAL

## 2021-12-07 MED ORDER — OXYCODONE HCL 5 MG PO TABS: 5.0000 mg | ORAL_TABLET | Freq: Once | ORAL | Status: DC | PRN

## 2021-12-07 MED ORDER — EPHEDRINE SULFATE (PRESSORS) 50 MG/ML IJ SOLN
INTRAMUSCULAR | Status: DC | PRN
  Administered 2021-12-07 (×2): 10 mg via INTRAVENOUS
  Administered 2021-12-07: 5 mg via INTRAVENOUS

## 2021-12-07 MED ORDER — DEXAMETHASONE SODIUM PHOSPHATE 10 MG/ML IJ SOLN
INTRAMUSCULAR | Status: AC
  Filled 2021-12-07: qty 1

## 2021-12-07 MED ORDER — ACETAMINOPHEN 500 MG PO TABS
1000.0000 mg | ORAL_TABLET | ORAL | Status: AC
  Administered 2021-12-07: 1000 mg via ORAL

## 2021-12-07 MED ORDER — ONDANSETRON HCL 4 MG/2ML IJ SOLN: 4.0000 mg | Freq: Once | INTRAMUSCULAR | Status: DC | PRN

## 2021-12-07 MED ORDER — BUPIVACAINE-EPINEPHRINE (PF) 0.25% -1:200000 IJ SOLN
INTRAMUSCULAR | Status: DC | PRN
  Administered 2021-12-07: 19 mL via PERINEURAL

## 2021-12-07 MED ORDER — GABAPENTIN 100 MG PO CAPS
ORAL_CAPSULE | ORAL | Status: AC
  Filled 2021-12-07: qty 1

## 2021-12-07 MED ORDER — HYDROMORPHONE HCL 1 MG/ML IJ SOLN: 0.2500 mg | INTRAMUSCULAR | Status: DC | PRN

## 2021-12-07 MED ORDER — CEFAZOLIN SODIUM-DEXTROSE 2-4 GM/100ML-% IV SOLN
2.0000 g | INTRAVENOUS | Status: AC
  Administered 2021-12-07: 20 g via INTRAVENOUS

## 2021-12-07 MED ORDER — LIDOCAINE 2% (20 MG/ML) 5 ML SYRINGE
INTRAMUSCULAR | Status: AC
  Filled 2021-12-07: qty 5

## 2021-12-07 MED ORDER — PHENYLEPHRINE HCL (PRESSORS) 10 MG/ML IV SOLN
INTRAVENOUS | Status: DC | PRN
  Administered 2021-12-07 (×2): 40 ug via INTRAVENOUS
  Administered 2021-12-07 (×2): 80 ug via INTRAVENOUS

## 2021-12-07 MED ORDER — EPHEDRINE 5 MG/ML INJ
INTRAVENOUS | Status: AC
  Filled 2021-12-07: qty 5

## 2021-12-07 MED ORDER — DEXAMETHASONE SODIUM PHOSPHATE 10 MG/ML IJ SOLN
INTRAMUSCULAR | Status: DC | PRN
  Administered 2021-12-07: 4 mg via INTRAVENOUS

## 2021-12-07 MED ORDER — OXYCODONE HCL 5 MG/5ML PO SOLN: 5.0000 mg | Freq: Once | ORAL | Status: DC | PRN

## 2021-12-07 MED ORDER — BUPIVACAINE-EPINEPHRINE (PF) 0.25% -1:200000 IJ SOLN
INTRAMUSCULAR | Status: AC
  Filled 2021-12-07: qty 60

## 2021-12-07 MED ORDER — OXYCODONE HCL 5 MG PO TABS: 5.0000 mg | ORAL_TABLET | Freq: Four times a day (QID) | ORAL | 0 refills | Status: DC | PRN

## 2021-12-07 MED ORDER — LIDOCAINE HCL 1 % IJ SOLN
INTRAMUSCULAR | Status: DC | PRN
  Administered 2021-12-07: 60 mg via INTRADERMAL

## 2021-12-07 MED ORDER — PHENYLEPHRINE 80 MCG/ML (10ML) SYRINGE FOR IV PUSH (FOR BLOOD PRESSURE SUPPORT)
PREFILLED_SYRINGE | INTRAVENOUS | Status: AC
  Filled 2021-12-07: qty 10

## 2021-12-07 MED ORDER — ONDANSETRON HCL 4 MG/2ML IJ SOLN
INTRAMUSCULAR | Status: AC
Start: 2021-12-07 — End: ?
  Filled 2021-12-07: qty 2

## 2021-12-07 MED ORDER — BUPIVACAINE HCL (PF) 0.25 % IJ SOLN
INTRAMUSCULAR | Status: AC
Start: 2021-12-07 — End: ?
  Filled 2021-12-07: qty 60

## 2021-12-07 MED ORDER — 0.9 % SODIUM CHLORIDE (POUR BTL) OPTIME
TOPICAL | Status: DC | PRN
  Administered 2021-12-07: 1000 mL

## 2021-12-07 MED ORDER — FENTANYL CITRATE (PF) 100 MCG/2ML IJ SOLN
INTRAMUSCULAR | Status: AC
  Filled 2021-12-07: qty 2

## 2021-12-07 MED ORDER — CEFAZOLIN SODIUM-DEXTROSE 2-4 GM/100ML-% IV SOLN
INTRAVENOUS | Status: AC
  Filled 2021-12-07: qty 100

## 2021-12-07 SURGICAL SUPPLY — 42 items
ADH SKN CLS APL DERMABOND .7 (GAUZE/BANDAGES/DRESSINGS) ×1
APL PRP STRL LF DISP 70% ISPRP (MISCELLANEOUS) ×1
APPLIER CLIP 9.375 MED OPEN (MISCELLANEOUS)
APR CLP MED 9.3 20 MLT OPN (MISCELLANEOUS)
BLADE SURG 15 STRL LF DISP TIS (BLADE) ×1 IMPLANT
BLADE SURG 15 STRL SS (BLADE) ×2
CANISTER SUC SOCK COL 7IN (MISCELLANEOUS) ×2 IMPLANT
CANISTER SUCT 1200ML W/VALVE (MISCELLANEOUS) ×2 IMPLANT
CHLORAPREP W/TINT 26 (MISCELLANEOUS) ×2 IMPLANT
CLIP APPLIE 9.375 MED OPEN (MISCELLANEOUS) IMPLANT
COVER BACK TABLE 60X90IN (DRAPES) ×2 IMPLANT
COVER MAYO STAND STRL (DRAPES) ×2 IMPLANT
COVER PROBE W GEL 5X96 (DRAPES) ×2 IMPLANT
DERMABOND ADVANCED (GAUZE/BANDAGES/DRESSINGS) ×1
DERMABOND ADVANCED .7 DNX12 (GAUZE/BANDAGES/DRESSINGS) ×1 IMPLANT
DRAPE LAPAROSCOPIC ABDOMINAL (DRAPES) ×2 IMPLANT
DRAPE UTILITY XL STRL (DRAPES) ×2 IMPLANT
ELECT COATED BLADE 2.86 ST (ELECTRODE) ×2 IMPLANT
ELECT REM PT RETURN 9FT ADLT (ELECTROSURGICAL) ×2
ELECTRODE REM PT RTRN 9FT ADLT (ELECTROSURGICAL) ×1 IMPLANT
GLOVE BIO SURGEON STRL SZ7.5 (GLOVE) ×4 IMPLANT
GOWN STRL REUS W/ TWL LRG LVL3 (GOWN DISPOSABLE) ×2 IMPLANT
GOWN STRL REUS W/TWL LRG LVL3 (GOWN DISPOSABLE) ×6
ILLUMINATOR WAVEGUIDE N/F (MISCELLANEOUS) IMPLANT
KIT MARKER MARGIN INK (KITS) ×2 IMPLANT
LIGHT WAVEGUIDE WIDE FLAT (MISCELLANEOUS) IMPLANT
NDL HYPO 25X1 1.5 SAFETY (NEEDLE) IMPLANT
NEEDLE HYPO 25X1 1.5 SAFETY (NEEDLE) IMPLANT
NS IRRIG 1000ML POUR BTL (IV SOLUTION) ×1 IMPLANT
PACK BASIN DAY SURGERY FS (CUSTOM PROCEDURE TRAY) ×2 IMPLANT
PENCIL SMOKE EVACUATOR (MISCELLANEOUS) ×2 IMPLANT
SLEEVE SCD COMPRESS KNEE MED (STOCKING) ×2 IMPLANT
SPIKE FLUID TRANSFER (MISCELLANEOUS) IMPLANT
SPONGE T-LAP 18X18 ~~LOC~~+RFID (SPONGE) ×2 IMPLANT
SUT MON AB 4-0 PC3 18 (SUTURE) ×2 IMPLANT
SUT SILK 2 0 SH (SUTURE) IMPLANT
SUT VICRYL 3-0 CR8 SH (SUTURE) ×2 IMPLANT
SYR CONTROL 10ML LL (SYRINGE) IMPLANT
TOWEL GREEN STERILE FF (TOWEL DISPOSABLE) ×2 IMPLANT
TRAY FAXITRON CT DISP (TRAY / TRAY PROCEDURE) ×2 IMPLANT
TUBE CONNECTING 20X1/4 (TUBING) ×2 IMPLANT
YANKAUER SUCT BULB TIP NO VENT (SUCTIONS) IMPLANT

## 2021-12-07 NOTE — Anesthesia Procedure Notes (Signed)
Procedure Name: LMA Insertion Date/Time: 12/07/2021 7:56 AM  Performed by: Garrel Ridgel, CRNAPre-anesthesia Checklist: Patient identified, Emergency Drugs available, Suction available and Patient being monitored Patient Re-evaluated:Patient Re-evaluated prior to induction Oxygen Delivery Method: Circle system utilized Preoxygenation: Pre-oxygenation with 100% oxygen Induction Type: IV induction Ventilation: Mask ventilation without difficulty LMA: LMA inserted LMA Size: 4.0 Number of attempts: 1 Airway Equipment and Method: Oral airway Placement Confirmation: positive ETCO2 and breath sounds checked- equal and bilateral Tube secured with: Tape Dental Injury: Teeth and Oropharynx as per pre-operative assessment

## 2021-12-07 NOTE — Transfer of Care (Signed)
Immediate Anesthesia Transfer of Care Note  Patient: Tammy Lindsey  Procedure(s) Performed: LEFT BREAST LUMPECTOMY WITH RADIOACTIVE SEED LOCALIZATION (Left: Breast)  Patient Location: PACU  Anesthesia Type:General  Level of Consciousness: awake, alert , oriented and patient cooperative  Airway & Oxygen Therapy: Patient Spontanous Breathing and Patient connected to face mask oxygen  Post-op Assessment: Report given to RN and Post -op Vital signs reviewed and stable  Post vital signs: Reviewed and stable  Last Vitals:  Vitals Value Taken Time  BP 125/62 12/07/21 0847  Temp    Pulse 84 12/07/21 0848  Resp 27 12/07/21 0848  SpO2 100 % 12/07/21 0848  Vitals shown include unvalidated device data.  Last Pain:  Vitals:   12/07/21 4730  PainSc: 0-No pain      Patients Stated Pain Goal: 6 (85/69/43 7005)  Complications: No notable events documented.

## 2021-12-07 NOTE — H&P (Signed)
REFERRING PHYSICIAN: Truitt Merle, MD  PROVIDER: Landry Corporal, MD  MRN: [id number] DOB: 1932/02/16 Subjective   Chief Complaint: Breast Cancer   History of Present Illness: Tammy Lindsey is a 88 y.o. female who is seen today as an office consultation for evaluation of Breast Cancer .   We are asked to see the patient in consultation by Dr. Burr Medico to evaluate her for a new left breast cancer. The patient is an 88 year old white female who recently went for a routine screening mammogram. At that time she was found to have calcifications in the lower central left breast measuring 1.3 cm. These were biopsied and came back as intermediate grade ductal carcinoma in situ that was ER and PR positive. She is otherwise in pretty good shape and does not smoke.  Review of Systems: A complete review of systems was obtained from the patient. I have reviewed this information and discussed as appropriate with the patient. See HPI as well for other ROS.  ROS   Medical History: Past Medical History:  Diagnosis Date  Arthritis  Asthma, unspecified asthma severity, unspecified whether complicated, unspecified whether persistent  GERD (gastroesophageal reflux disease)  History of cancer   Patient Active Problem List  Diagnosis  Ductal carcinoma in situ (DCIS) of left breast  Gastro-esophageal reflux disease without esophagitis   Past Surgical History:  Procedure Laterality Date  CHOLECYSTECTOMY 04/25/1972  ARTHROSCOPIC ROTATOR CUFF REPAIR Left  04/25/2009  JOINT REPLACEMENT Left  04/25/2010 and 08/24/2010    Allergies  Allergen Reactions  Amlodipine Other (See Comments)  Sulfa (Sulfonamide Antibiotics) Rash   Current Outpatient Medications on File Prior to Visit  Medication Sig Dispense Refill  albuterol 90 mcg/actuation inhaler Inhale into the lungs once daily  aspirin 81 MG EC tablet Take 81 mg by mouth once daily  atenoloL (TENORMIN) 25 MG tablet Take 1 tablet by mouth once daily   dexlansoprazole (DEXILANT) 60 mg DR capsule Take 1 capsule by mouth once daily  loperamide-simethicone (IMODIUM MULTI-SYMPTOM RELIEF) 2-125 mg Tab as directed   No current facility-administered medications on file prior to visit.   Family History  Problem Relation Age of Onset  Breast cancer Daughter    Social History   Tobacco Use  Smoking Status Never  Smokeless Tobacco Never    Social History   Socioeconomic History  Marital status: Unknown  Tobacco Use  Smoking status: Never  Smokeless tobacco: Never  Vaping Use  Vaping Use: Never used  Substance and Sexual Activity  Alcohol use: Yes  Comment: one glass of wine now and then per pt  Drug use: Never   Objective:  There were no vitals filed for this visit.  There is no height or weight on file to calculate BMI.  Physical Exam Vitals reviewed.  Constitutional:  General: She is not in acute distress. Appearance: Normal appearance.  HENT:  Head: Normocephalic and atraumatic.  Right Ear: External ear normal.  Left Ear: External ear normal.  Nose: Nose normal.  Mouth/Throat:  Mouth: Mucous membranes are moist.  Pharynx: Oropharynx is clear.  Eyes:  General: No scleral icterus. Extraocular Movements: Extraocular movements intact.  Conjunctiva/sclera: Conjunctivae normal.  Pupils: Pupils are equal, round, and reactive to light.  Cardiovascular:  Rate and Rhythm: Normal rate and regular rhythm.  Pulses: Normal pulses.  Heart sounds: Normal heart sounds.  Pulmonary:  Effort: Pulmonary effort is normal. No respiratory distress.  Breath sounds: Normal breath sounds.  Abdominal:  General: Bowel sounds are normal.  Palpations: Abdomen is  soft.  Tenderness: There is no abdominal tenderness.  Musculoskeletal:  General: No swelling, tenderness or deformity. Normal range of motion.  Cervical back: Normal range of motion and neck supple.  Skin: General: Skin is warm and dry.  Coloration: Skin is not jaundiced.   Neurological:  General: No focal deficit present.  Mental Status: She is alert and oriented to person, place, and time.  Psychiatric:  Mood and Affect: Mood normal.  Behavior: Behavior normal.    Breast: There is no palpable mass in either breast. There is no palpable axillary, supraclavicular, or cervical lymphadenopathy.  Labs, Imaging and Diagnostic Testing:  Assessment and Plan:   Diagnoses and all orders for this visit:  Ductal carcinoma in situ (DCIS) of left breast    The patient appears to have a 1.3 cm area of ductal carcinoma in situ in the lower central left breast. I have discussed with her in detail the different options for treatment and at this point she favors breast conservation which I feel is very reasonable. She will not need a node evaluation. I have discussed with her in detail the risks and benefits of the operation as well as some of the technical aspects and she understands and wishes to proceed. She will also meet with medical and radiation oncology to discuss adjuvant therapy.

## 2021-12-07 NOTE — Discharge Instructions (Signed)
  Post Anesthesia Home Care Instructions  Activity: Get plenty of rest for the remainder of the day. A responsible individual must stay with you for 24 hours following the procedure.  For the next 24 hours, DO NOT: -Drive a car -Paediatric nurse -Drink alcoholic beverages -Take any medication unless instructed by your physician -Make any legal decisions or sign important papers.  Meals: Start with liquid foods such as gelatin or soup. Progress to regular foods as tolerated. Avoid greasy, spicy, heavy foods. If nausea and/or vomiting occur, drink only clear liquids until the nausea and/or vomiting subsides. Call your physician if vomiting continues.  Special Instructions/Symptoms: Your throat may feel dry or sore from the anesthesia or the breathing tube placed in your throat during surgery. If this causes discomfort, gargle with warm salt water. The discomfort should disappear within 24 hours.  If you had a scopolamine patch placed behind your ear for the management of post- operative nausea and/or vomiting:  1. The medication in the patch is effective for 72 hours, after which it should be removed.  Wrap patch in a tissue and discard in the trash. Wash hands thoroughly with soap and water. 2. You may remove the patch earlier than 72 hours if you experience unpleasant side effects which may include dry mouth, dizziness or visual disturbances. 3. Avoid touching the patch. Wash your hands with soap and water after contact with the patch.     Next tylenol dose after 12:45 pm

## 2021-12-07 NOTE — Op Note (Signed)
12/07/2021  8:45 AM  PATIENT:  Tammy Lindsey  88 y.o. female  PRE-OPERATIVE DIAGNOSIS:  LEFT BREAST DUCTAL CARCINOMA IN SITU  POST-OPERATIVE DIAGNOSIS:  LEFT BREAST DUCTAL CARCINOMA IN SITU  PROCEDURE:  Procedure(s): LEFT BREAST LUMPECTOMY WITH RADIOACTIVE SEED LOCALIZATION (Left)  SURGEON:  Surgeon(s) and Role:    * Jovita Kussmaul, MD - Primary  PHYSICIAN ASSISTANT:   ASSISTANTS: none   ANESTHESIA:   local and general  EBL:  5 mL   BLOOD ADMINISTERED:none  DRAINS: none   LOCAL MEDICATIONS USED:  MARCAINE     SPECIMEN:  Source of Specimen:  left breast tissue with additional deep margin and second seed  DISPOSITION OF SPECIMEN:  PATHOLOGY  COUNTS:  YES  TOURNIQUET:  * No tourniquets in log *  DICTATION: .Dragon Dictation  After informed consent was obtained the patient was brought to the operating room and placed in the supine position on the operating table.  After adequate induction of general anesthesia the patient's left breast was prepped with ChloraPrep, allowed to dry, and draped in usual sterile manner.  An appropriate timeout was performed.  Previously an I-125 seed was placed in the outer central left breast to mark an area of ductal carcinoma in situ.  A second seed was placed but was malpositioned laterally.  The neoprobe was set to I-125 and the 2 seeds were readily identified.  Attention was first turned to the appropriately placed seed more centrally.  The area around this was infiltrated with quarter percent Marcaine.  A curvilinear incision was made along the outer aspect of the areola of the left breast with a 15 blade knife.  The incision was carried through the skin and subcutaneous tissue sharply with the electrocautery.  Dissection was then carried towards the radioactive seed under the direction of the neoprobe.  Once I more closely approached the radioactive seed I then removed a circular portion of breast tissue sharply around the radioactive seed while  checking the area of radioactivity frequently.  Once the specimen was removed it was oriented with the appropriate paint colors.  A specimen radiograph was obtained that showed the clip and seed to be near the center of the specimen.  I did elect to take an additional deep margin all the way down to the chest wall muscle and this was marked appropriately and also sent to pathology.  Using the neoprobe I then was able to identify the location of the second seed.  Dissection was then carried towards the seed until I identified the seed.  The seed was removed by itself and confirmed with a specimen radiograph and sent to pathology.  Hemostasis was achieved using the Bovie electrocautery.  The wound was irrigated with saline and infiltrated with quarter percent Marcaine.  The deep layer of the wound was then closed with layers of interrupted 3-0 Vicryl stitches.  The skin was then closed with interrupted 4-0 Monocryl subcuticular stitches.  Dermabond dressings were applied.  The patient tolerated the procedure well.  At the end of the case all needle sponge and instrument counts were correct.  The patient was then awakened and taken to recovery in stable condition.  PLAN OF CARE: Discharge to home after PACU  PATIENT DISPOSITION:  PACU - hemodynamically stable.   Delay start of Pharmacological VTE agent (>24hrs) due to surgical blood loss or risk of bleeding: not applicable

## 2021-12-07 NOTE — Interval H&P Note (Signed)
History and Physical Interval Note:  12/07/2021 7:20 AM  Tammy Lindsey  has presented today for surgery, with the diagnosis of LEFT BREAST DUCTAL CARCINOMA IN SITU.  The various methods of treatment have been discussed with the patient and family. After consideration of risks, benefits and other options for treatment, the patient has consented to  Procedure(s): LEFT BREAST LUMPECTOMY WITH RADIOACTIVE SEED LOCALIZATION (Left) as a surgical intervention.  The patient's history has been reviewed, patient examined, no change in status, stable for surgery.  I have reviewed the patient's chart and labs.  Questions were answered to the patient's satisfaction.     Autumn Messing III

## 2021-12-07 NOTE — Anesthesia Postprocedure Evaluation (Signed)
Anesthesia Post Note  Patient: Tammy Lindsey  Procedure(s) Performed: LEFT BREAST LUMPECTOMY WITH RADIOACTIVE SEED LOCALIZATION (Left: Breast)     Patient location during evaluation: PACU Anesthesia Type: General Level of consciousness: awake and alert and oriented Pain management: pain level controlled Vital Signs Assessment: post-procedure vital signs reviewed and stable Respiratory status: spontaneous breathing, nonlabored ventilation and respiratory function stable Cardiovascular status: blood pressure returned to baseline and stable Postop Assessment: no apparent nausea or vomiting Anesthetic complications: no   No notable events documented.  Last Vitals:  Vitals:   12/07/21 0900 12/07/21 0912  BP: 114/60 (!) 131/59  Pulse: 81 79  Resp: 16 16  Temp:  36.5 C  SpO2: 95% 95%    Last Pain:  Vitals:   12/07/21 0912  TempSrc: Oral  PainSc: 0-No pain                 Eniya Cannady A.

## 2021-12-08 ENCOUNTER — Encounter (HOSPITAL_BASED_OUTPATIENT_CLINIC_OR_DEPARTMENT_OTHER): Payer: Self-pay | Admitting: General Surgery

## 2021-12-08 LAB — SURGICAL PATHOLOGY

## 2021-12-13 ENCOUNTER — Telehealth: Payer: Self-pay | Admitting: Genetic Counselor

## 2021-12-13 NOTE — Telephone Encounter (Signed)
Ms. Lekas LVM requesting genetic testing results.  Returned call to let her know results are expected to return around 8/24.  Will call when available.

## 2021-12-14 ENCOUNTER — Encounter: Payer: Self-pay | Admitting: *Deleted

## 2021-12-14 DIAGNOSIS — D1801 Hemangioma of skin and subcutaneous tissue: Secondary | ICD-10-CM | POA: Diagnosis not present

## 2021-12-14 DIAGNOSIS — L814 Other melanin hyperpigmentation: Secondary | ICD-10-CM | POA: Diagnosis not present

## 2021-12-14 DIAGNOSIS — L821 Other seborrheic keratosis: Secondary | ICD-10-CM | POA: Diagnosis not present

## 2021-12-14 DIAGNOSIS — S00462A Insect bite (nonvenomous) of left ear, initial encounter: Secondary | ICD-10-CM | POA: Diagnosis not present

## 2021-12-15 DIAGNOSIS — R310 Gross hematuria: Secondary | ICD-10-CM | POA: Diagnosis not present

## 2021-12-16 ENCOUNTER — Encounter: Payer: Self-pay | Admitting: Genetic Counselor

## 2021-12-16 ENCOUNTER — Ambulatory Visit: Payer: Self-pay | Admitting: Genetic Counselor

## 2021-12-16 ENCOUNTER — Telehealth: Payer: Self-pay | Admitting: Genetic Counselor

## 2021-12-16 DIAGNOSIS — D0512 Intraductal carcinoma in situ of left breast: Secondary | ICD-10-CM

## 2021-12-16 DIAGNOSIS — Z1379 Encounter for other screening for genetic and chromosomal anomalies: Secondary | ICD-10-CM

## 2021-12-16 DIAGNOSIS — Z803 Family history of malignant neoplasm of breast: Secondary | ICD-10-CM

## 2021-12-16 DIAGNOSIS — R310 Gross hematuria: Secondary | ICD-10-CM | POA: Diagnosis not present

## 2021-12-16 NOTE — Telephone Encounter (Signed)
Revealed negative genetic testing for pan-cancer panel.  Per patient, daughter already had negative genetic testing.  Encouraged her to keep in touch with genetics.  Encouraged family members to notify providers about family history of cancer.

## 2021-12-16 NOTE — Progress Notes (Signed)
HPI:   Ms. Cham was previously seen in the [facility] clinic due to a personal and family history of breast cancer and concerns regarding a hereditary predisposition to cancer. Please refer to our prior cancer genetics clinic note for more information regarding our discussion, assessment and recommendations, at the time. Ms. Stepka recent genetic test results were disclosed to her, as were recommendations warranted by these results. These results and recommendations are discussed in more detail below.  CANCER HISTORY:  Oncology History Overview Note   Cancer Staging  Ductal carcinoma in situ (DCIS) of left breast Staging form: Breast, AJCC 8th Edition - Clinical stage from 11/16/2021: Stage 0 (cTis (DCIS), cN0, cM0, G1, ER+, PR+, HER2: Not Assessed) - Signed by Truitt Merle, MD on 11/23/2021    Ductal carcinoma in situ (DCIS) of left breast  11/10/2021 Mammogram   Exam: 3D Mammogram Diagnostic Unilateral Digital - L  IMPRESSION: The 1.3 cm group of calcifications in the left breast is suspicious of malignancy.   11/16/2021 Cancer Staging   Staging form: Breast, AJCC 8th Edition - Clinical stage from 11/16/2021: Stage 0 (cTis (DCIS), cN0, cM0, G1, ER+, PR+, HER2: Not Assessed) - Signed by Truitt Merle, MD on 11/23/2021 Stage prefix: Initial diagnosis Histologic grading system: 3 grade system   11/16/2021 Initial Biopsy   Diagnosis Breast, left, needle core biopsy, central to slightly 6 o'clock 3cmfn - DUCTAL CARCINOMA IN SITU, INTERMEDIATE NUCLEAR GRADE - GREATEST DIMENSION: 0.6 CM - NECROSIS: PRESENT - CALCIFICATIONS: PRESENT - SEE NOTE Diagnosis Note E-cadherin is positive consistent with ductal phenotype.  PROGNOSTIC INDICATORS Results: Estrogen Receptor: 100%, POSITIVE, STRONG STAINING INTENSITY Progesterone Receptor: 70%, POSITIVE, STRONG STAINING INTENSITY   11/22/2021 Initial Diagnosis   Ductal carcinoma in situ (DCIS) of left breast   12/14/2021 Genetic Testing    Negative hereditary cancer genetic testing: no pathogenic variants detected in [facility] CustomNext-Cancer +RNAinsight Panel.  Report date is 12/14/2021.   The CustomNext-Cancer+RNAinsight panel offered by [facility] includes sequencing and rearrangement analysis for the following 47 genes:  APC, ATM, AXIN2, BARD1, BMPR1A, BRCA1, BRCA2, BRIP1, CDH1, CDK4, CDKN2A, CHEK2, DICER1, EPCAM, GREM1, HOXB13, MEN1, MLH1, MSH2, MSH3, MSH6, MUTYH, NBN, NF1, NF2, NTHL1, PALB2, PMS2, POLD1, POLE, PTEN, RAD51C, RAD51D, RECQL, RET, SDHA, SDHAF2, SDHB, SDHC, SDHD, SMAD4, SMARCA4, STK11, TP53, TSC1, TSC2, and VHL.  RNA data is routinely analyzed for use in variant interpretation for all genes.     FAMILY HISTORY:  We obtained a detailed, 4-generation family history.  Significant diagnoses are listed below:      Family History  Problem Relation Age of Onset   Breast cancer Daughter 36   Cancer Cousin          dx early 23s; unknown type; paternal female cousin       Ms. Sahlin reported that her daughter previously had negative genetic testing.  No report was available for review.  There is no reported Ashkenazi Jewish ancestry. There is no known consanguinity.  GENETIC TEST RESULTS:  The [facility] CustomNext-Cancer +RNAinsight Panel found no pathogenic mutations.   The CancerNext-Expanded gene panel offered by [facility] and includes sequencing, rearrangement, and RNA analysis for the following 77 genes: AIP, ALK, APC, ATM, AXIN2, BAP1, BARD1, BLM, BMPR1A, BRCA1, BRCA2, BRIP1, CDC73, CDH1, CDK4, CDKN1B, CDKN2A, CHEK2, CTNNA1, DICER1, FANCC, FH, FLCN, GALNT12, KIF1B, LZTR1, MAX, MEN1, MET, MLH1, MSH2, MSH3, MSH6, MUTYH, NBN, NF1, NF2, NTHL1, PALB2, PHOX2B, PMS2, POT1, PRKAR1A, PTCH1, PTEN, RAD51C, RAD51D, RB1, RECQL, RET, SDHA, SDHAF2, SDHB, SDHC,  SDHD, SMAD4, SMARCA4, SMARCB1, SMARCE1, STK11, SUFU, TMEM127, TP53, TSC1, TSC2, VHL and XRCC2 (sequencing and deletion/duplication); EGFR, EGLN1, HOXB13, KIT, MITF, PDGFRA,  POLD1, and POLE (sequencing only); EPCAM and GREM1 (deletion/duplication only).  .   The test report has been scanned into EPIC and is located under the Molecular Pathology section of the Results Review tab.  A portion of the result report is included below for reference. Genetic testing reported out on December 14, 2021.      Even though a pathogenic variant was not identified, possible explanations for the cancer in the family may include: There may be no hereditary risk for cancer in the family. The cancers in Ms. Arai and/or her family may be sporadic/familial or due to other genetic and environmental factors. There may be a gene mutation in one of these genes that current testing methods cannot detect but that chance is small. There could be another gene that has not yet been discovered, or that we have not yet tested, that is responsible for the cancer diagnoses in the family.  It is also possible there is a hereditary cause for the cancer in the family that Ms. Kithcart did not inherit.   Therefore, it is important to remain in touch with cancer genetics in the future so that we can continue to offer Ms. Stevison the most up to date genetic testing.   ADDITIONAL GENETIC TESTING:  We discussed with Ms. Difrancesco that her genetic testing was fairly extensive.  If there are additional relevant genes identified to increase cancer risk that can be analyzed in the future, we would be happy to discuss and coordinate this testing at that time.     CANCER SCREENING RECOMMENDATIONS:  Ms. Carollo test result is considered negative (normal).  This means that we have not identified a hereditary cause for her personal history of breast cancer at this time.   An individual's cancer risk and medical management are not determined by genetic test results alone. Overall cancer risk assessment incorporates additional factors, including personal medical history, family history, and any available genetic information  that may result in a personalized plan for cancer prevention and surveillance. Therefore, it is recommended she continue to follow the cancer management and screening guidelines provided by her oncology and primary healthcare provider.  RECOMMENDATIONS FOR FAMILY MEMBERS:   Since she did not inherit a identifiable mutation in a cancer predisposition gene included on this panel, her children could not have inherited a known mutation from her in one of these genes. Individuals in this family might be at some increased risk of developing cancer, over the general population risk, due to the family history of cancer.  Individuals in the family should notify their providers of the family history of cancer. We recommend women in this family have a yearly mammogram beginning at age 54, or 68 years younger than the earliest onset of cancer, an annual clinical breast exam, and perform monthly breast self-exams.   Other members of the family may still carry a pathogenic variant in one of these genes that Ms. Ante did not inherit. Per Ms. Halbleib, her daughter, who has a breast cancer history, had negative genetics previously.  We recommend that her daughter keep in touch with her genetics providers to ensure up to date genetic testing throughout time.   FOLLOW-UP:   e encouraged her to remain in contact with cancer genetics on an annual basis so we can update her personal and family histories and let her know  of advances in cancer genetics that may benefit this family.   Our contact number was provided. Ms. Hulce questions were answered to her satisfaction, and she knows she is welcome to call us at anytime with additional questions or concerns.   Marguriete Wootan M. Joette Catching, [place], [facility] Genetic Counselor [email or web address] (P) [phone]

## 2021-12-20 ENCOUNTER — Non-Acute Institutional Stay: Payer: PPO | Admitting: Adult Health

## 2021-12-20 ENCOUNTER — Inpatient Hospital Stay: Payer: PPO | Admitting: Oncology

## 2021-12-20 ENCOUNTER — Encounter: Payer: Self-pay | Admitting: Adult Health

## 2021-12-20 ENCOUNTER — Encounter: Payer: Self-pay | Admitting: Oncology

## 2021-12-20 VITALS — BP 141/77 | HR 69 | Temp 97.3°F | Resp 12 | Ht 62.0 in | Wt 128.4 lb

## 2021-12-20 VITALS — BP 111/66 | HR 65 | Temp 97.9°F | Ht 62.0 in | Wt 129.2 lb

## 2021-12-20 DIAGNOSIS — W57XXXA Bitten or stung by nonvenomous insect and other nonvenomous arthropods, initial encounter: Secondary | ICD-10-CM

## 2021-12-20 DIAGNOSIS — R053 Chronic cough: Secondary | ICD-10-CM

## 2021-12-20 DIAGNOSIS — D0512 Intraductal carcinoma in situ of left breast: Secondary | ICD-10-CM

## 2021-12-20 DIAGNOSIS — R194 Change in bowel habit: Secondary | ICD-10-CM

## 2021-12-20 MED ORDER — AZELASTINE HCL 0.1 % NA SOLN: 2.0000 | Freq: Two times a day (BID) | NASAL | 12 refills | Status: DC

## 2021-12-20 MED ORDER — LETROZOLE 2.5 MG PO TABS: 2.5000 mg | ORAL_TABLET | Freq: Every day | ORAL | 5 refills | Status: DC

## 2021-12-20 NOTE — Patient Instructions (Addendum)
Can try taking questran twice daily for frequent stools but be sure to separate from other meds.  Let us know if you have frequent loose stools abdominal pain or fever  Will call u about recommendation for nasal spray

## 2021-12-20 NOTE — Progress Notes (Signed)
[facility] Patient Consult   Requesting MD: Truitt Merle, [address] [place],  [place] [address]   Tammy Lindsey 88 y.o.  1931/05/22    Reason for Consult: DCIS   HPI: Tammy Lindsey underwent a screening mammogram on 11/04/2021.  This revealed a group of calcifications in the central left breast.  No other masses or calcifications in either breast.  A diagnostic mammogram on 11/10/2021 revealed a 1.3 cm group of branching linear calcifications in the left breast at the 6 o'clock position anterior depth. She underwent a needle core biopsy 11/16/2021.  This revealed DCIS with intermediate nuclear grade measuring 0.6 cm.  Necrosis is present.  ER 100%, PR 70%.  She was referred to Dr. Marlou Lindsey and was taken to a radioactive seed localized left breast lumpectomy on 12/07/2021.  The pathology revealed a small focus of residual DCIS, nuclear grade 2 at the previous biopsy site.  Resection margins are negative.  Additional deep margin from the left breast returned negative for DCIS and invasive carcinoma.  All margins were at least 10 mm.  She reports feeling completely well prior to the screening mammogram.  She was treated for a urinary tract infection after an episode of hematuria last week.  Past Medical History:  Diagnosis Date   Breast cancer ([facility]) 10/2021   left breast DCIS   Diverticulosis    Esophageal reflux    Family history of breast cancer 11/25/2021   Hiatal hernia    Hx of colonic polyps    Hypertension    Per new patient packet   Mild asthma    Per new patient packet   Osteoarthritis    Osteopenia    bone density 1-09 and 2-13   Pulmonary nodules    scattered-CT Scan July 2010, 12-10, and 04-2010-all stable felt benign     .  G5 P3, 2 miscarriages   .  C. difficile January 2023  Past Surgical History:  Procedure Laterality Date   BREAST LUMPECTOMY WITH RADIOACTIVE SEED LOCALIZATION Left 12/07/2021   Procedure: LEFT BREAST LUMPECTOMY WITH  RADIOACTIVE SEED LOCALIZATION;  Surgeon: Tammy Kussmaul, MD;  Location: [facility];  Service: General;  Laterality: Left;   CHOLECYSTECTOMY  04/25/1972   Gall Bladder; Tammy Lindsey   DG PORTABLE CHEST X RAY ([facility] HX)  2023   Shoulder   DIAGNOSTIC MAMMOGRAM  2022   [facility]; Per new patient packet   JOINT REPLACEMENT Left 08/24/2010   JOINT REPLACEMENT Left 04/25/2010   Reverse    orthroscopic  Rotator Cuff Left 04/25/2009   Partial shoulder Left 04/26/2003   ROTATOR CUFF REPAIR     x 2 1998 and 1999   SPINE SURGERY  04/26/1983   disk   TONSILLECTOMY  04/25/1950   Tammy Lindsey; Per new patient packet    Medications: Reviewed  Allergies:  Allergies  Allergen Reactions   Amlodipine     States it made her dizzy   Sulfa Antibiotics     Family history: Her daughter had invasive breast cancer, atypical hyperplasia, and DCIS Social History:   She is in independent living at [facility]  She does not use cigarettes.  She occasionally has a glass of wine with dinner.  No transfusion history.  No risk factor for hepatitis.  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  ROS:   Positives include: Chronic nonproductive cough, hematuria 1 week ago-treated for urinary tract infection, frequent bowel movements, chronic rash  over the back  A complete ROS was otherwise negative.  Physical Exam:  Blood pressure (!) 141/77, pulse 69, temperature (!) 97.3 F (36.3 C), temperature source Oral, resp. rate 12, height 5' 2" (1.575 m), weight 128 lb 6.4 oz (58.2 kg), SpO2 100 %.  HEENT: Oral cavity without visible mass, neck without mass Lungs: Clear bilaterally Cardiac: Regular rate and rhythm Abdomen: No hepatosplenomegaly, nontender Breast: Status post left lumpectomy.  Healing incision at the areola.  No mass in either breast.  Both axillae appear benign Vascular: No leg edema Lymph nodes: No cervical, supraclavicular, axillary, or inguinal nodes Neurologic: Alert and  oriented, the motor exam appears intact in the upper and lower extremities bilaterally Skin: Hyperpigmented rash at the mid back Musculoskeletal: No spine tenderness   LAB:  CBC  Lab Results  Component Value Date   WBC 6.4 11/24/2021   HGB 13.7 11/24/2021   HCT 39.3 11/24/2021   MCV 82.2 11/24/2021   PLT 300 11/24/2021   NEUTROABS 4.4 11/24/2021        CMP  Lab Results  Component Value Date   NA 133 (L) 11/24/2021   K 4.3 11/24/2021   CL 99 11/24/2021   CO2 26 11/24/2021   GLUCOSE 90 11/24/2021   BUN 14 11/24/2021   CREATININE 0.68 11/24/2021   CALCIUM 9.1 11/24/2021   PROT 6.9 11/24/2021   ALBUMIN 4.4 11/24/2021   AST 12 (L) 11/24/2021   ALT 11 11/24/2021   ALKPHOS 54 11/24/2021   BILITOT 0.5 11/24/2021   GFRNONAA >60 11/24/2021   GFRAA  07/24/2010    >60        The eGFR has been calculated using the MDRD equation. This calculation has not been validated in all clinical situations. eGFR's persistently <60 mL/min signify possible Chronic Kidney Disease.        Assessment/Plan:   Left breast DCIS Screening mammogram 11/04/2021-calcifications in the central left breast Diagnostic left mammogram 719 2023-1.3 cm group of calcifications in the left breast suspicious for malignancy Stereotactic biopsy 11/16/2021-DCIS, intermediate nuclear grade, 0.6 cm, necrosis present, calcifications present, ER 100%, PR 70% Left lumpectomy 12/07/2021-small residual focus of DCIS in vicinity of biopsy site, negative margins.  All margins negative for DCIS by at least 10 mm Gastroesophageal reflux Chronic nonproductive cough C. difficile January 2023 Family history of breast cancer-negative genetic testing   Disposition:   Tammy Lindsey has been diagnosed with DCIS after undergoing a left lumpectomy.  She has a good prognosis for a long-term disease-free survival.  We discussed the risk of developing recurrent DCIS and invasive cancer in the left breast in addition to  contralateral cancer.  We discussed the expected benefit of adjuvant radiation and systemic therapy in this setting.  Tammy Lindsey appears to have a low risk DCIS based on the DCIS size and positive hormone receptors.  I feel she is a candidate to forego adjuvant radiation.  We discussed  the benefit of adjuvant systemic therapy is to decrease the risk of developing a new breast cancer.  Ms. Kopplin is most comfortable receiving adjuvant aromatase inhibitor therapy as opposed to no treatment.  We reviewed potential toxicities associated with aromatase inhibitors including the chance of hot flashes, arthralgias, and decreased bone density.  She will begin letrozole.  Ms. Deahl will return for an office visit in 3 months.  She will continue yearly mammography.  Betsy Coder, MD  12/20/2021, 10:25 AM

## 2021-12-20 NOTE — Progress Notes (Signed)
Location:  [facility]  POS: Clinic  Provider: Royal Hawthorn, ANP  Code Status:  Goals of Care:     12/20/2021    3:14 PM  Advanced Directives  Does Patient Have a Medical Advance Directive? Yes  Type of Paramedic of [place];Living will;Out of facility DNR (pink MOST or yellow form)  Does patient want to make changes to medical advance directive? No - Patient declined     Chief Complaint  Patient presents with   Medical Management of Chronic Issues    Patient is having bowel problems and has a tick bite.    HPI: Patient is a 88 y.o. female seen today for acute issues.   Chronic cough which is dry  x 1 year , saw allergist. She was told she may have a "light case of asthma".  No wheezing or sob. Using albuterol each morning for cough with no benefit.   Had cdiff in the past and last test was negative but still has three bowel movements per day. 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  On cipro for bladder infection prescribed by Dr Joaquim Lai king.   She had a tick bite in July on the left ear, two times. Has been sore. No rash fever, chills after.   Had left breast lumpectomy 12/07/21 due to ductal carcinoma in situ.  Past Medical History:  Diagnosis Date   Breast cancer ([facility]) 10/2021   left breast DCIS   Diverticulosis    Esophageal reflux    Family history of breast cancer 11/25/2021   Hiatal hernia    Hx of colonic polyps    Hypertension    Per new patient packet   Mild asthma    Per new patient packet   Osteoarthritis    Osteopenia    bone density 1-09 and 2-13   Pulmonary nodules    scattered-CT Scan July 2010, 12-10, and 04-2010-all stable felt benign     Past Surgical History:  Procedure Laterality Date   BREAST LUMPECTOMY WITH RADIOACTIVE SEED LOCALIZATION Left 12/07/2021   Procedure: LEFT BREAST LUMPECTOMY WITH RADIOACTIVE SEED LOCALIZATION;  Surgeon: Jovita Kussmaul, MD;  Location: [facility];  Service:  General;  Laterality: Left;   CHOLECYSTECTOMY  04/25/1972   Gall Bladder; [facility]   DG PORTABLE CHEST X RAY ([facility]X)  2023   Shoulder   DIAGNOSTIC MAMMOGRAM  2022   [facility]; Per new patient packet   JOINT REPLACEMENT Left 08/24/2010   JOINT REPLACEMENT Left 04/25/2010   Reverse    orthroscopic  Rotator Cuff Left 04/25/2009   Partial shoulder Left 04/26/2003   ROTATOR CUFF REPAIR     x 2 1998 and 1999   SPINE SURGERY  04/26/1983   disk   TONSILLECTOMY  04/25/1950   Dr. Nyoka Cowden; Per new patient packet    Allergies  Allergen Reactions   Amlodipine     States it made her dizzy   Sulfa Antibiotics     Outpatient Encounter Medications as of 12/20/2021  Medication Sig   acetaminophen (TYLENOL) 500 MG tablet Take 500 mg by mouth as needed.   ALBUTEROL SULFATE IN Inhale 1 puff into the lungs daily as needed.   atenolol (TENORMIN) 25 MG tablet Take 25 mg by mouth daily.   Cholecalciferol (D3 PO) Take 250 mcg by mouth daily.   cholestyramine (QUESTRAN) 4 g packet Take 1 packet by mouth daily.   ciprofloxacin (CIPRO) 250 MG tablet Take 250 mg by mouth 2 (  two) times daily.   Cyanocobalamin (B-12) 1000 MCG TABS Take by mouth daily.   dexlansoprazole (DEXILANT) 60 MG capsule Take 60 mg by mouth daily.   diclofenac Sodium (VOLTAREN) 1 % GEL Apply topically as needed.   fluticasone (FLONASE ALLERGY RELIEF) 50 MCG/ACT nasal spray Place 1 spray into both nostrils daily as needed.   letrozole (FEMARA) 2.5 MG tablet Take 1 tablet (2.5 mg total) by mouth daily.   lidocaine (SALONPAS PAIN RELIEVING) 4 % Place onto the skin as needed.   Loperamide-Simethicone 2-125 MG TABS Take 1 tablet by mouth as needed.   losartan (COZAAR) 50 MG tablet Take 50 mg by mouth daily.   No facility-administered encounter medications on file as of 12/20/2021.    Review of Systems:  Review of Systems  Constitutional:  Negative for activity change, appetite change, chills, diaphoresis, fatigue, fever and  unexpected weight change.  HENT:  Positive for congestion and rhinorrhea. Negative for sinus pain, sneezing, sore throat and trouble swallowing.   Respiratory:  Positive for cough. Negative for shortness of breath and wheezing.   Cardiovascular:  Negative for chest pain, palpitations and leg swelling.  Gastrointestinal:  Negative for abdominal distention, abdominal pain, constipation and diarrhea.  Genitourinary:  Negative for difficulty urinating and dysuria.  Musculoskeletal:  Negative for arthralgias, back pain, gait problem, joint swelling and myalgias.  Neurological:  Negative for dizziness, tremors, seizures, syncope, facial asymmetry, speech difficulty, weakness, light-headedness, numbness and headaches.  Psychiatric/Behavioral:  Negative for agitation, behavioral problems and confusion.     Health Maintenance  Topic Date Due   TETANUS/TDAP  Never done   COVID-19 Vaccine (4 - Moderna risk series) 04/02/2021   Pneumonia Vaccine 80+ Years old (2 - PCV) 11/10/2021   INFLUENZA VACCINE  01/25/2022 (Originally 11/23/2021)   DEXA SCAN  Completed   Zoster Vaccines- Shingrix  Completed   HPV VACCINES  Aged Out    Physical Exam: Vitals:   12/20/21 1503  BP: 111/66  Pulse: 65  Temp: 97.9 F (36.6 C)  SpO2: 93%  Weight: 129 lb 3.2 oz (58.6 kg)  Height: '5\' 2"'$  (1.575 m)   Body mass index is 23.63 kg/m. Physical Exam Vitals and nursing note reviewed.  Constitutional:      General: She is not in acute distress.    Appearance: She is not diaphoretic.  HENT:     Head: Normocephalic and atraumatic.     Ears:     Comments: Left ear auricle area healing bite. No drainage or redness or swelling.     Nose: Congestion and rhinorrhea present.     Mouth/Throat:     Mouth: Mucous membranes are moist.     Comments: White drainage in OP area.  Eyes:     Conjunctiva/sclera: Conjunctivae normal.     Pupils: Pupils are equal, round, and reactive to light.  Neck:     Vascular: No JVD.   Cardiovascular:     Rate and Rhythm: Normal rate and regular rhythm.     Heart sounds: No murmur heard. Pulmonary:     Effort: Pulmonary effort is normal. No respiratory distress.     Breath sounds: Normal breath sounds. No wheezing.  Skin:    General: Skin is warm and dry.  Neurological:     Mental Status: She is alert and oriented to person, place, and time.  Psychiatric:        Mood and Affect: Mood normal.     Labs reviewed: Basic Metabolic Panel: Recent Labs  01/14/21 2003 11/18/21 0000 11/24/21 0818  NA 138 134* 133*  K 4.3 4.5 4.3  CL 101 97* 99  CO2 27 24* 26  GLUCOSE 125*  --  90  BUN '10 13 14  '$ CREATININE 0.62 0.6 0.68  CALCIUM 9.6 9.1 9.1  TSH  --  1.41  --    Liver Function Tests: Recent Labs    11/18/21 0000 11/24/21 0818  AST 14 12*  ALT 18 11  ALKPHOS 67 54  BILITOT  --  0.5  PROT  --  6.9  ALBUMIN  --  4.4   No results for input(s): "LIPASE", "AMYLASE" in the last 8760 hours. No results for input(s): "AMMONIA" in the last 8760 hours. CBC: Recent Labs    01/14/21 2003 11/18/21 0000 11/24/21 0818  WBC 7.8 7.5 6.4  NEUTROABS 5.2  --  4.4  HGB 14.3 13.7 13.7  HCT 42.7  --  39.3  MCV 82.8  --  82.2  PLT 278 328 300   Lipid Panel: Recent Labs    11/18/21 0000  CHOL 152  HDL 55  LDLCALC 70  TRIG 136   No results found for: "HGBA1C"  Procedures since last visit: No results found.  Assessment/Plan  1. Chronic cough Her cough seems to be related to post nasal drip. Will try another nasal spray. If she is not improving we discussed returning to clinic here and if no improvement pulmonary referral.  - azelastine (ASTELIN) 0.1 % nasal spray; Place 2 sprays into both nostrils 2 (two) times daily. Use in each nostril as directed  Dispense: 30 mL; Refill: 12  2. Frequent bowel movements Continue questran, could try twice daily  Stools are formed.  Monitor for diarrhea, fever abd pain.   3. Tick bite, unspecified site, initial  encounter No rash or systemic symptoms. Area is healing.   Labs/tests ordered:  * No order type specified * Next appt:  03/16/2022   Total time 81mn:  time greater than 50% of total time spent doing pt counseling and coordination of care

## 2021-12-21 ENCOUNTER — Inpatient Hospital Stay: Payer: PPO | Admitting: Licensed Clinical Social Worker

## 2021-12-21 DIAGNOSIS — R195 Other fecal abnormalities: Secondary | ICD-10-CM | POA: Diagnosis not present

## 2021-12-21 NOTE — Progress Notes (Signed)
[place] [facility]  Clinical Social Work was referred by nurse for assessment of psychosocial needs.  Clinical Social Worker contacted patient by phone  to offer support and assess for needs.    Patient stated she was "doing just great" and had been informed that she did not need further treatment.  She currently resides at [facility] continuous care community.  She reports having support and activities to attend.  She said she does not have any needs at this time.  CSW provided patient with contact information.   Margaree Mackintosh, LCSW  Clinical Social Worker [facility]

## 2021-12-24 ENCOUNTER — Encounter: Payer: Self-pay | Admitting: *Deleted

## 2021-12-28 DIAGNOSIS — R310 Gross hematuria: Secondary | ICD-10-CM | POA: Diagnosis not present

## 2021-12-29 ENCOUNTER — Encounter (HOSPITAL_COMMUNITY): Payer: Self-pay

## 2021-12-31 DIAGNOSIS — C50912 Malignant neoplasm of unspecified site of left female breast: Secondary | ICD-10-CM | POA: Diagnosis not present

## 2021-12-31 DIAGNOSIS — Z23 Encounter for immunization: Secondary | ICD-10-CM | POA: Diagnosis not present

## 2021-12-31 DIAGNOSIS — L989 Disorder of the skin and subcutaneous tissue, unspecified: Secondary | ICD-10-CM | POA: Diagnosis not present

## 2021-12-31 DIAGNOSIS — Z170 Estrogen receptor positive status [ER+]: Secondary | ICD-10-CM | POA: Diagnosis not present

## 2022-01-04 DIAGNOSIS — L570 Actinic keratosis: Secondary | ICD-10-CM | POA: Diagnosis not present

## 2022-01-04 DIAGNOSIS — D0422 Carcinoma in situ of skin of left ear and external auricular canal: Secondary | ICD-10-CM | POA: Diagnosis not present

## 2022-01-17 DIAGNOSIS — M810 Age-related osteoporosis without current pathological fracture: Secondary | ICD-10-CM | POA: Diagnosis not present

## 2022-01-17 DIAGNOSIS — I10 Essential (primary) hypertension: Secondary | ICD-10-CM | POA: Diagnosis not present

## 2022-01-17 DIAGNOSIS — K219 Gastro-esophageal reflux disease without esophagitis: Secondary | ICD-10-CM | POA: Diagnosis not present

## 2022-01-17 DIAGNOSIS — J4520 Mild intermittent asthma, uncomplicated: Secondary | ICD-10-CM | POA: Diagnosis not present

## 2022-01-17 DIAGNOSIS — F325 Major depressive disorder, single episode, in full remission: Secondary | ICD-10-CM | POA: Diagnosis not present

## 2022-01-18 ENCOUNTER — Encounter: Payer: Self-pay | Admitting: Internal Medicine

## 2022-02-02 ENCOUNTER — Encounter (HOSPITAL_BASED_OUTPATIENT_CLINIC_OR_DEPARTMENT_OTHER): Payer: Self-pay | Admitting: Emergency Medicine

## 2022-02-02 ENCOUNTER — Other Ambulatory Visit: Payer: Self-pay

## 2022-02-02 DIAGNOSIS — Z96651 Presence of right artificial knee joint: Secondary | ICD-10-CM | POA: Insufficient documentation

## 2022-02-02 DIAGNOSIS — I10 Essential (primary) hypertension: Secondary | ICD-10-CM | POA: Insufficient documentation

## 2022-02-02 DIAGNOSIS — Z7951 Long term (current) use of inhaled steroids: Secondary | ICD-10-CM | POA: Diagnosis not present

## 2022-02-02 DIAGNOSIS — Z853 Personal history of malignant neoplasm of breast: Secondary | ICD-10-CM | POA: Insufficient documentation

## 2022-02-02 DIAGNOSIS — J45909 Unspecified asthma, uncomplicated: Secondary | ICD-10-CM | POA: Insufficient documentation

## 2022-02-02 DIAGNOSIS — Z79899 Other long term (current) drug therapy: Secondary | ICD-10-CM | POA: Diagnosis not present

## 2022-02-02 NOTE — ED Triage Notes (Signed)
Patient reports home reading of 190/110 BP.  Patient has a history of HTN and took BP meds PTA.

## 2022-02-03 ENCOUNTER — Emergency Department (HOSPITAL_BASED_OUTPATIENT_CLINIC_OR_DEPARTMENT_OTHER)
Admission: EM | Admit: 2022-02-03 | Discharge: 2022-02-03 | Disposition: A | Discharge status: Home / Self Care | Payer: PPO | Attending: Emergency Medicine | Admitting: Emergency Medicine

## 2022-02-03 DIAGNOSIS — I10 Essential (primary) hypertension: Secondary | ICD-10-CM

## 2022-02-03 LAB — BASIC METABOLIC PANEL
Anion gap: 8 (ref 5–15)
BUN: 8 mg/dL (ref 8–23)
CO2: 27 mmol/L (ref 22–32)
Calcium: 9.7 mg/dL (ref 8.9–10.3)
Chloride: 95 mmol/L — ABNORMAL LOW (ref 98–111)
Creatinine, Ser: 0.6 mg/dL (ref 0.44–1.00)
GFR, Estimated: >60 mL/min (ref >60)
Glucose, Bld: 104 mg/dL — ABNORMAL HIGH (ref 70–99)
Potassium: 4.2 mmol/L (ref 3.5–5.1)
Sodium: 130 mmol/L — ABNORMAL LOW (ref 135–145)

## 2022-02-03 LAB — CBC WITH DIFFERENTIAL/PLATELET
Abs Immature Granulocytes: 0.02 10*3/uL (ref 0.00–0.07)
Basophils Absolute: 0.1 10*3/uL (ref 0.0–0.1)
Basophils Relative: 1 %
Eosinophils Absolute: 0.2 10*3/uL (ref 0.0–0.5)
Eosinophils Relative: 3 %
HCT: 41.9 % (ref 36.0–46.0)
Hemoglobin: 14.4 g/dL (ref 12.0–15.0)
Immature Granulocytes: 0 %
Lymphocytes Relative: 29 %
Lymphs Abs: 2.4 10*3/uL (ref 0.7–4.0)
MCH: 27.9 pg (ref 26.0–34.0)
MCHC: 34.4 g/dL (ref 30.0–36.0)
MCV: 81.2 fL (ref 80.0–100.0)
Monocytes Absolute: 0.7 10*3/uL (ref 0.1–1.0)
Monocytes Relative: 9 %
Neutro Abs: 4.9 10*3/uL (ref 1.7–7.7)
Neutrophils Relative %: 58 %
Platelets: 331 10*3/uL (ref 150–400)
RBC: 5.16 MIL/uL — ABNORMAL HIGH (ref 3.87–5.11)
RDW: 12.5 % (ref 11.5–15.5)
WBC: 8.4 10*3/uL (ref 4.0–10.5)
nRBC: 0 % (ref 0.0–0.2)

## 2022-02-03 NOTE — ED Notes (Signed)
Patient continues to deny chest pain/shob/neuro symptoms.

## 2022-02-03 NOTE — ED Provider Notes (Signed)
[facility] Provider Note: Georgena Spurling, MD, FACEP  CSN: [id number] MRN: [id number] ARRIVAL: 02/02/22 at 2009 ROOM: [place]  Hypertension   HISTORY OF PRESENT ILLNESS  02/03/22 2:42 AM Tammy Lindsey is a 88 y.o. female with a history of hypertension.  She lives in independent living at [facility] and developed a left temporal headache and some general malaise yesterday evening.  She had no change in vision and no focal numbness or weakness.  She had no chest pain or shortness of breath.  She contacted the nurse and found her blood pressure to be 190/110 and was sent here.  Her blood pressures have varied here but her most recent blood pressure is 177/82.  Her headache is nearly resolved and she no longer has a sensation of general malaise although she is tired from waiting in the ED now for over 6 hours.  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   Diverticulosis    Esophageal reflux    Family history of breast cancer 11/25/2021   Hiatal hernia    Hx of colonic polyps    Hypertension    Per new patient packet   Mild asthma    Per new patient packet   Osteoarthritis    Osteopenia    bone density 1-09 and 2-13   Pulmonary nodules    scattered-CT Scan July 2010, 12-10, and 04-2010-all stable felt benign     Past Surgical History:  Procedure Laterality Date   BREAST LUMPECTOMY WITH RADIOACTIVE SEED LOCALIZATION Left 12/07/2021   Procedure: LEFT BREAST LUMPECTOMY WITH RADIOACTIVE SEED LOCALIZATION;  Surgeon: Jovita Kussmaul, MD;  Location: [facility];  Service: General;  Laterality: Left;   CHOLECYSTECTOMY  04/25/1972   Gall Bladder; [facility]   DG PORTABLE CHEST X RAY ([facility])  2023   Shoulder   DIAGNOSTIC MAMMOGRAM  2022   [facility]; Per new patient packet   JOINT REPLACEMENT Left 08/24/2010   JOINT REPLACEMENT Left 04/25/2010   Reverse    orthroscopic  Rotator Cuff Left 04/25/2009    Partial shoulder Left 04/26/2003   ROTATOR CUFF REPAIR     x 2 1998 and 1999   SPINE SURGERY  04/26/1983   disk   TONSILLECTOMY  04/25/1950   Dr. Nyoka Cowden; Per new patient packet    Family History  Problem Relation Age of Onset   Cancer Mother        cervical   Alzheimer's disease Mother    Asthma Father    Other Father        Cerebral hemorrhage   Heart disease Sister        Heart disease before age 49   Hypertension Sister    Stroke Maternal Grandfather    Heart attack Maternal Grandfather    Breast cancer Daughter 25   Asthma Son    Cancer Cousin        dx early 57s; unknown type; paternal female cousin   Neuropathy Neg Hx     Social History   Tobacco Use   Smoking status: Never   Smokeless tobacco: Never  Vaping Use   Vaping Use: Never used  Substance Use Topics   Alcohol use: Yes    Alcohol/week: 4.0 standard drinks of alcohol    Types: 4 Glasses of wine per week    Comment: 4 glasses of wine a week   Drug use: No  Prior to Admission medications   Medication Sig Start Date End Date Taking? Authorizing Provider  acetaminophen (TYLENOL) 500 MG tablet Take 500 mg by mouth as needed.    [provider]  ALBUTEROL SULFATE IN Inhale 1 puff into the lungs daily as needed.    [provider]  atenolol (TENORMIN) 25 MG tablet Take 25 mg by mouth daily.    [provider]  azelastine (ASTELIN) 0.1 % nasal spray Place 2 sprays into both nostrils 2 (two) times daily. Use in each nostril as directed 12/20/21   Royal Hawthorn, NP  Cholecalciferol (D3 PO) Take 250 mcg by mouth daily.    [provider]  cholestyramine (QUESTRAN) 4 g packet Take 1 packet by mouth daily. 12/02/21   [provider]  ciprofloxacin (CIPRO) 250 MG tablet Take 250 mg by mouth 2 (two) times daily. 12/16/21   [provider]  Cyanocobalamin (B-12) 1000 MCG TABS Take by mouth daily.    [provider]  dexlansoprazole (DEXILANT) 60 MG  capsule Take 60 mg by mouth daily.    [provider]  diclofenac Sodium (VOLTAREN) 1 % GEL Apply topically as needed.    [provider]  fluticasone (FLONASE ALLERGY RELIEF) 50 MCG/ACT nasal spray Place 1 spray into both nostrils daily as needed.    [provider]  letrozole (FEMARA) 2.5 MG tablet Take 1 tablet (2.5 mg total) by mouth daily. 12/20/21   Ladell Pier, MD  lidocaine (SALONPAS PAIN RELIEVING) 4 % Place onto the skin as needed.    [provider]  Loperamide-Simethicone 2-125 MG TABS Take 1 tablet by mouth as needed.    [provider]  losartan (COZAAR) 50 MG tablet Take 50 mg by mouth daily. 11/11/21   [provider]    Allergies Amlodipine and Sulfa antibiotics   REVIEW OF SYSTEMS  Negative except as noted here or in the History of Present Illness.   PHYSICAL EXAMINATION  Initial Vital Signs Blood pressure (!) 188/131, pulse 79, temperature 98 F (36.7 C), temperature source Oral, resp. rate 20, height '5\' 2"'$  (1.575 m), weight 57.2 kg, SpO2 96 %.  Examination General: Well-developed, well-nourished female in no acute distress; appearance consistent with age of record HENT: normocephalic; atraumatic Eyes: pupils equal, round and reactive to light; extraocular muscles intact; bilateral arcus senilis; bilateral pseudophakia Neck: supple Heart: regular rate and rhythm Lungs: clear to auscultation bilaterally Abdomen: soft; nondistended; nontender; bowel sounds present Extremities: No deformity; full range of motion; pulses normal Neurologic: Awake, alert and oriented; motor function intact in all extremities and symmetric; no facial droop Skin: Warm and dry Psychiatric: Normal mood and affect   RESULTS  Summary of this visit's results, reviewed and interpreted by myself:   EKG Interpretation  Date/Time:    Ventricular Rate:    PR Interval:    QRS Duration:   QT Interval:    QTC Calculation:   R  Axis:     Text Interpretation:         Laboratory Studies: Results for orders placed or performed during the hospital encounter of 02/03/22 (from the past 24 hour(s))  Basic metabolic panel     Status: Abnormal   Collection Time: 02/03/22  2:05 AM  Result Value Ref Range   Sodium 130 (L) 135 - 145 mmol/L   Potassium 4.2 3.5 - 5.1 mmol/L   Chloride 95 (L) 98 - 111 mmol/L   CO2 27 22 - 32 mmol/L   Glucose,  Bld 104 (H) 70 - 99 mg/dL   BUN 8 8 - 23 mg/dL   Creatinine, Ser 0.60 0.44 - 1.00 mg/dL   Calcium 9.7 8.9 - 10.3 mg/dL   GFR, Estimated >60 >60 mL/min   Anion gap 8 5 - 15  CBC with Differential     Status: Abnormal   Collection Time: 02/03/22  2:05 AM  Result Value Ref Range   WBC 8.4 4.0 - 10.5 K/uL   RBC 5.16 (H) 3.87 - 5.11 MIL/uL   Hemoglobin 14.4 12.0 - 15.0 g/dL   HCT 41.9 36.0 - 46.0 %   MCV 81.2 80.0 - 100.0 fL   MCH 27.9 26.0 - 34.0 pg   MCHC 34.4 30.0 - 36.0 g/dL   RDW 12.5 11.5 - 15.5 %   Platelets 331 150 - 400 K/uL   nRBC 0.0 0.0 - 0.2 %   Neutrophils Relative % 58 %   Neutro Abs 4.9 1.7 - 7.7 K/uL   Lymphocytes Relative 29 %   Lymphs Abs 2.4 0.7 - 4.0 K/uL   Monocytes Relative 9 %   Monocytes Absolute 0.7 0.1 - 1.0 K/uL   Eosinophils Relative 3 %   Eosinophils Absolute 0.2 0.0 - 0.5 K/uL   Basophils Relative 1 %   Basophils Absolute 0.1 0.0 - 0.1 K/uL   Immature Granulocytes 0 %   Abs Immature Granulocytes 0.02 0.00 - 0.07 K/uL   Imaging Studies: No results found.  ED COURSE and MDM  Nursing notes, initial and subsequent vitals signs, including pulse oximetry, reviewed and interpreted by myself.  Vitals:   02/02/22 2019 02/02/22 2020 02/03/22 0031 02/03/22 0204  BP: (!) 192/88  (!) 170/102 (!) 188/131  Pulse:   71 79  Resp:   16 20  Temp:    98 F (36.7 C)  TempSrc:    Oral  SpO2:   100% 96%  Weight:  57.2 kg    Height:  '5\' 2"'$  (1.575 m)     Medications - No data to display  Patient's examination is normal.  Her blood pressure has  improved.  She has no signs of endorgan damage and I do not believe any acute intervention is necessary or appropriate at this time.  She should follow-up with her primary care physician regarding long-term management of her hypertension.  PROCEDURES  Procedures   ED DIAGNOSES     ICD-10-CM   1. Hypertension not at goal  [facility], Jenny Reichmann, MD 02/03/22 [phone]

## 2022-02-04 ENCOUNTER — Telehealth: Payer: Self-pay

## 2022-02-04 ENCOUNTER — Encounter: Payer: Self-pay | Admitting: Pulmonary Disease

## 2022-02-04 ENCOUNTER — Ambulatory Visit: Payer: PPO | Admitting: Pulmonary Disease

## 2022-02-04 VITALS — BP 136/70 | HR 79 | Ht 62.0 in | Wt 125.6 lb

## 2022-02-04 DIAGNOSIS — R053 Chronic cough: Secondary | ICD-10-CM

## 2022-02-04 MED ORDER — MONTELUKAST SODIUM 10 MG PO TABS: 10.0000 mg | ORAL_TABLET | Freq: Every day | ORAL | 3 refills | Status: DC

## 2022-02-04 MED ORDER — FLUTICASONE PROPIONATE 50 MCG/ACT NA SUSP: NASAL | 11 refills | Status: DC

## 2022-02-04 MED ORDER — AZELASTINE HCL 0.1 % NA SOLN: 2.0000 | Freq: Two times a day (BID) | NASAL | 12 refills | Status: DC

## 2022-02-04 NOTE — Patient Instructions (Signed)
Nice to meet you  I recommend taking Flonase 1 spray each nostril twice a day for 2 weeks.  After 2 weeks you can go to taking it 1 spray each nostril once a day.  In addition, use azelastine nasal spray 2 sprays each nostril twice a day.  Every day.  Lastly, take montelukast 1 pill at night, every night.  Lets see if this helps with the nasal congestion, the dripping of mucus down the back of the throat, and improves the cough.  Return to clinic in 3 months or sooner as needed with Dr. Silas Flood

## 2022-02-04 NOTE — Telephone Encounter (Signed)
Patient was advised and verbalized understanding. She scheduled appointment on 02/08/22 with Dr. Veleta Miners.

## 2022-02-04 NOTE — Progress Notes (Signed)
@Patient  ID: Tammy Lindsey, female    DOB: 1931/06/17, 88 y.o.   MRN: [id number]  Chief Complaint  Patient presents with  . Consult    Consult for persistant cough. Pt states that the cough has been going on for months. Primary care told her she might have a slight case of asthma. Pt states inhalers don't help with the cough. Pt states that she does take OTC cough drops and cough syrup. Pt states the cough is dry and nonproductive. More frequent during the day.     Referring provider: Lajean Manes, MD  HPI:   PMH:  Smoker/ Smoking History:  Maintenance:   Pt of:   02/04/2022  - Visit     Questionaires / Pulmonary Flowsheets:   ACT:      No data to display          MMRC:     No data to display          Epworth:      No data to display          Tests:   FENO:  No results found for: "NITRICOXIDE"  PFT:     No data to display          WALK:      No data to display          Imaging: No results found.  Lab Results:  CBC    Component Value Date/Time   WBC 8.4 02/03/2022 0205   RBC 5.16 (H) 02/03/2022 0205   HGB 14.4 02/03/2022 0205   HGB 13.7 11/24/2021 0818   HCT 41.9 02/03/2022 0205   PLT 331 02/03/2022 0205   PLT 300 11/24/2021 0818   MCV 81.2 02/03/2022 0205   MCH 27.9 02/03/2022 0205   MCHC 34.4 02/03/2022 0205   RDW 12.5 02/03/2022 0205   LYMPHSABS 2.4 02/03/2022 0205   MONOABS 0.7 02/03/2022 0205   EOSABS 0.2 02/03/2022 0205   BASOSABS 0.1 02/03/2022 0205    BMET    Component Value Date/Time   NA 130 (L) 02/03/2022 0205   NA 134 (A) 11/18/2021 0000   K 4.2 02/03/2022 0205   CL 95 (L) 02/03/2022 0205   CO2 27 02/03/2022 0205   GLUCOSE 104 (H) 02/03/2022 0205   BUN 8 02/03/2022 0205   BUN 13 11/18/2021 0000   CREATININE 0.60 02/03/2022 0205   CREATININE 0.68 11/24/2021 0818   CALCIUM 9.7 02/03/2022 0205   GFRNONAA >60 02/03/2022 0205   GFRNONAA >60 11/24/2021 0818   GFRAA  07/24/2010 0600    >60         The eGFR has been calculated using the MDRD equation. This calculation has not been validated in all clinical situations. eGFR's persistently <60 mL/min signify possible Chronic Kidney Disease.    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 Amlodipine     States it made her dizzy  . Sulfa Antibiotics     Immunization History  Administered Date(s) Administered  . Influenza Split 01/10/2011, 12/27/2011, 01/09/2012, 01/26/2015, 12/17/2017, 01/12/2019, 12/19/2019, 12/19/2020  . Moderna Covid-19 Vaccine Bivalent Booster 83yr & up 02/05/2021  . Moderna SARS-COV2 Booster Vaccination 03/10/2020, 11/24/2020  . Moderna Sars-Covid-2 Vaccination 05/07/2019, 06/04/2019  . Pneumococcal Polysaccharide-23 07/21/2020, 11/10/2020  .  Zoster Recombinat (Shingrix) 09/06/2016, 11/22/2016    Past Medical History:  Diagnosis Date  . 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 Diverticulosis   . Esophageal reflux   . Family history of breast cancer 11/25/2021  . Hiatal hernia   . Hx of colonic polyps   . Hypertension    Per new patient packet  . Mild asthma    Per new patient packet  . Osteoarthritis   . Osteopenia    bone density 1-09 and 2-13  . Pulmonary nodules    scattered-CT Scan July 2010, 12-10, and 04-2010-all stable felt benign     Tobacco History: Social History   Tobacco Use  Smoking Status Never  Smokeless Tobacco Never   Counseling given: Not Answered   Continue to not smoke  Outpatient Encounter Medications as of 02/04/2022  Medication Sig  . acetaminophen (TYLENOL) 500 MG tablet Take 500 mg by mouth as needed.  . ALBUTEROL SULFATE IN Inhale 1 puff into the lungs daily as needed.  Marland Kitchen atenolol (TENORMIN) 25 MG tablet Take 25 mg by mouth daily.  . Cholecalciferol (D3 PO)  Take 250 mcg by mouth daily.  . cholestyramine (QUESTRAN) 4 g packet Take 1 packet by mouth daily.  . Cyanocobalamin (B-12) 1000 MCG TABS Take by mouth daily.  Marland Kitchen dexlansoprazole (DEXILANT) 60 MG capsule Take 60 mg by mouth daily.  . diclofenac Sodium (VOLTAREN) 1 % GEL Apply topically as needed.  Marland Kitchen letrozole (FEMARA) 2.5 MG tablet Take 1 tablet (2.5 mg total) by mouth daily.  Marland Kitchen lidocaine (SALONPAS PAIN RELIEVING) 4 % Place onto the skin as needed.  . Loperamide-Simethicone 2-125 MG TABS Take 1 tablet by mouth as needed.  Marland Kitchen losartan (COZAAR) 50 MG tablet Take 50 mg by mouth daily.  . montelukast (SINGULAIR) 10 MG tablet Take 1 tablet (10 mg total) by mouth at bedtime.  . [DISCONTINUED] azelastine (ASTELIN) 0.1 % nasal spray Place 2 sprays into both nostrils 2 (two) times daily. Use in each nostril as directed  . [DISCONTINUED] fluticasone (FLONASE ALLERGY RELIEF) 50 MCG/ACT nasal spray Place 1 spray into both nostrils daily as needed.  Marland Kitchen azelastine (ASTELIN) 0.1 % nasal spray Place 2 sprays into both nostrils 2 (two) times daily. Use in each nostril as directed  . fluticasone (FLONASE ALLERGY RELIEF) 50 MCG/ACT nasal spray Use 1 spray each nostril twice a day for 14 days then use 1 spray each nostril once daily thereafter  . [DISCONTINUED] ciprofloxacin (CIPRO) 250 MG tablet Take 250 mg by mouth 2 (two) times daily.   No facility-administered encounter medications on file as of 02/04/2022.     Review of Systems  Review of Systems   Physical Exam  BP 136/70 (BP Location: Left Arm, Patient Position: Sitting, Cuff Size: Normal)   Pulse 79   Ht 5' 2"  (1.575 m)   Wt 125 lb 9.6 oz (57 kg)   SpO2 96%   BMI 22.97 kg/m   Wt Readings from Last 5 Encounters:  02/04/22 125 lb 9.6 oz (57 kg)  02/02/22 126 lb (57.2 kg)  12/20/21 129 lb 3.2 oz (58.6 kg)  12/20/21 128 lb 6.4 oz (58.2 kg)  12/07/21 128 lb 8.5 oz (58.3 kg)    BMI Readings from Last 5 Encounters:  02/04/22 22.97 kg/m   02/02/22 23.05 kg/m  12/20/21 23.63 kg/m  12/20/21 23.48 kg/m  12/07/21 23.51 kg/m     Physical Exam    Assessment & Plan:   No problem-specific  Assessment & Plan notes found for this encounter.    Return in about 3 months (around 05/07/2022).   Lanier Clam, MD 02/04/2022   This appointment required *** minutes of patient care (this includes precharting, chart review, review of results, face-to-face care, etc.).

## 2022-02-04 NOTE — Telephone Encounter (Signed)
Patient called to advise Dr. Veleta Miners she went to the [facility] ED due to leveled bp reading of 192/102. She would like to know if additional medication needs to be added. She is currently taking Losartan 50 mg and Atenolol 25 mg daily. She reports that  on yesterday,02/03/22 bp was 119/66. She reports doubling on Losartan to 100 mg on Wednesday,02/02/22 afternoon and her bp was normal. Please advise

## 2022-02-04 NOTE — Telephone Encounter (Signed)
Tell her to double her Losartan and also to come and see me this Tue. I have openings in my schedule

## 2022-02-07 ENCOUNTER — Telehealth: Payer: Self-pay | Admitting: Pulmonary Disease

## 2022-02-07 NOTE — Telephone Encounter (Signed)
Called and spoke with patient and told her that the albuterol is as needed. I advised her to let me know if she feels like she is having to use her albuterol everyday with no relief. She verbalized understanding. Nothing further needed

## 2022-02-08 ENCOUNTER — Other Ambulatory Visit (HOSPITAL_BASED_OUTPATIENT_CLINIC_OR_DEPARTMENT_OTHER): Payer: Self-pay

## 2022-02-08 ENCOUNTER — Non-Acute Institutional Stay: Payer: PPO | Admitting: Internal Medicine

## 2022-02-08 ENCOUNTER — Encounter: Payer: Self-pay | Admitting: Internal Medicine

## 2022-02-08 VITALS — BP 180/82 | HR 83 | Temp 98.1°F | Resp 18 | Ht 62.0 in | Wt 127.6 lb

## 2022-02-08 DIAGNOSIS — I10 Essential (primary) hypertension: Secondary | ICD-10-CM | POA: Diagnosis not present

## 2022-02-08 MED ORDER — LOSARTAN POTASSIUM 50 MG PO TABS
50.0000 mg | ORAL_TABLET | Freq: Two times a day (BID) | ORAL | 3 refills | Status: DC
  Filled 2022-02-08 (×2): qty 60, 30d supply, fill #0
  Filled 2022-03-12: qty 60, 30d supply, fill #1

## 2022-02-08 MED ORDER — AMLODIPINE BESYLATE 2.5 MG PO TABS: 2.5000 mg | ORAL_TABLET | Freq: Every day | ORAL | 0 refills | Status: DC

## 2022-02-08 MED ORDER — AMLODIPINE BESYLATE 2.5 MG PO TABS
2.5000 mg | ORAL_TABLET | Freq: Every day | ORAL | 0 refills | Status: DC
  Filled 2022-02-08: qty 30, 30d supply, fill #0

## 2022-02-08 NOTE — Patient Instructions (Addendum)
Amlodipine 2.5 mg in the morning with Losartan 50 mg Tenormin 25 mg and Losartan 50 mg in the evening

## 2022-02-09 NOTE — Progress Notes (Signed)
Location: [facility] Service:  Clinic (12)  Provider:   Code Status:  Goals of Care:     02/08/2022    3:45 PM  Advanced Directives  Does Patient Have a Medical Advance Directive? Yes  Type of Paramedic of [place];Out of facility DNR (pink MOST or yellow form);Living will  Copy of [place] in Chart? Yes - validated most recent copy scanned in chart (See row information)  Pre-existing out of facility DNR order (yellow form or pink MOST form) Pink MOST/Yellow Form most recent copy in chart - Physician notified to receive inpatient order     Chief Complaint  Patient presents with   Medical Management of Chronic Issues    Follow up on Blood Pressure.   Immunizations    Discussed need for Pneumonia, Covid, Flu and shingles vaccine.    HPI: Patient is a 88 y.o. female seen today for an acute visit for Elevated BP  Patient has a history of hypertension Recently her blood pressure has been running high went to ED on 10/12.  Her blood work was within normal limits Sodium was 130  she was told to follow-up with the PCP She did increase her losartan to 100 mg from 50 mg. Her blood pressure still staying elevated Patient states that she feels heavy in her head.  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 Underwent left breast lumpectomy.  Was ER/PR positive and is now on letrozole per Dr. 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   Diverticulosis    Esophageal reflux    Family history of breast cancer 11/25/2021   Hiatal hernia    Hx of colonic polyps    Hypertension    Per new patient packet   Mild asthma    Per new patient packet   Osteoarthritis    Osteopenia    bone density 1-09 and 2-13   Pulmonary nodules    scattered-CT Scan July 2010, 12-10, and 04-2010-all stable felt benign     Past Surgical  History:  Procedure Laterality Date   BREAST LUMPECTOMY WITH RADIOACTIVE SEED LOCALIZATION Left 12/07/2021   Procedure: LEFT BREAST LUMPECTOMY WITH RADIOACTIVE SEED LOCALIZATION;  Surgeon: Jovita Kussmaul, MD;  Location: [facility];  Service: General;  Laterality: Left;   CHOLECYSTECTOMY  04/25/1972   Gall Bladder; [facility]   DG PORTABLE CHEST X RAY ([place] HX)  2023   Shoulder   DIAGNOSTIC MAMMOGRAM  2022   [facility]; Per new patient packet   JOINT REPLACEMENT Left 08/24/2010   JOINT REPLACEMENT Left 04/25/2010   Reverse    orthroscopic  Rotator Cuff Left 04/25/2009   Partial shoulder Left 04/26/2003   ROTATOR CUFF REPAIR     x 2 1998 and 1999   SPINE SURGERY  04/26/1983   disk   TONSILLECTOMY  04/25/1950   Dr. Nyoka Cowden; Per new patient packet    Allergies  Allergen Reactions   Sulfa Antibiotics     Outpatient Encounter Medications as of 02/08/2022  Medication Sig   acetaminophen (TYLENOL) 500 MG tablet Take 500 mg by mouth as needed.   ALBUTEROL SULFATE IN Inhale 1 puff into the lungs daily as needed.   amLODipine (NORVASC) 2.5 MG tablet Take 1 tablet (2.5 mg total) by mouth daily.   atenolol (TENORMIN) 25 MG tablet Take 25 mg  by mouth daily.   azelastine (ASTELIN) 0.1 % nasal spray Place 2 sprays into both nostrils 2 (two) times daily. Use in each nostril as directed   Cholecalciferol (D3 PO) Take 250 mcg by mouth daily.   cholestyramine (QUESTRAN) 4 g packet Take 1 packet by mouth daily.   Cyanocobalamin (B-12) 1000 MCG TABS Take by mouth daily.   dexlansoprazole (DEXILANT) 60 MG capsule Take 60 mg by mouth daily.   diclofenac Sodium (VOLTAREN) 1 % GEL Apply topically as needed.   fluticasone (FLONASE ALLERGY RELIEF) 50 MCG/ACT nasal spray Use 1 spray each nostril twice a day for 14 days then use 1 spray each nostril once daily thereafter   letrozole (FEMARA) 2.5 MG tablet Take 1 tablet (2.5 mg total) by mouth daily.   lidocaine (SALONPAS PAIN RELIEVING) 4 %  Place onto the skin as needed.   Loperamide-Simethicone 2-125 MG TABS Take 1 tablet by mouth as needed.   montelukast (SINGULAIR) 10 MG tablet Take 1 tablet (10 mg total) by mouth at bedtime.   [DISCONTINUED] amLODipine (NORVASC) 2.5 MG tablet Take 1 tablet (2.5 mg total) by mouth daily.   [DISCONTINUED] losartan (COZAAR) 50 MG tablet Take 50 mg by mouth 2 (two) times daily.   losartan (COZAAR) 50 MG tablet Take 1 tablet (50 mg total) by mouth 2 (two) times daily.   No facility-administered encounter medications on file as of 02/08/2022.    Review of Systems:  Review of Systems  Constitutional:  Negative for activity change and appetite change.  HENT: Negative.    Respiratory:  Positive for cough. Negative for shortness of breath.   Cardiovascular:  Negative for leg swelling.  Gastrointestinal:  Negative for constipation.  Genitourinary: Negative.   Musculoskeletal:  Negative for arthralgias, gait problem and myalgias.  Skin: Negative.   Neurological:  Negative for dizziness and weakness.  Psychiatric/Behavioral:  Negative for confusion, dysphoric mood and sleep disturbance.     Health Maintenance  Topic Date Due   TETANUS/TDAP  Never done   Pneumonia Vaccine 30+ Years old (2 - PCV) 11/10/2021   INFLUENZA VACCINE  11/23/2021   COVID-19 Vaccine (5 - Moderna risk series) 02/05/2022   DEXA SCAN  Completed   Zoster Vaccines- Shingrix  Completed   HPV VACCINES  Aged Out    Physical Exam: Vitals:   02/08/22 1539  BP: (!) 180/82  Pulse: 83  Resp: 18  Temp: 98.1 F (36.7 C)  TempSrc: Temporal  SpO2: 94%  Weight: 127 lb 9.6 oz (57.9 kg)  Height: '5\' 2"'$  (1.575 m)   Body mass index is 23.34 kg/m. Physical Exam Vitals reviewed.  Constitutional:      Appearance: Normal appearance.  HENT:     Head: Normocephalic.     Nose: Nose normal.     Mouth/Throat:     Mouth: Mucous membranes are moist.     Pharynx: Oropharynx is clear.  Eyes:     Pupils: Pupils are equal, round,  and reactive to light.  Cardiovascular:     Rate and Rhythm: Normal rate and regular rhythm.     Pulses: Normal pulses.     Heart sounds: Normal heart sounds. No murmur heard. Pulmonary:     Effort: Pulmonary effort is normal.     Breath sounds: Normal breath sounds.  Abdominal:     General: Abdomen is flat. Bowel sounds are normal.     Palpations: Abdomen is soft.  Musculoskeletal:        General: No swelling.  Cervical back: Neck supple.  Skin:    General: Skin is warm.  Neurological:     General: No focal deficit present.     Mental Status: She is alert and oriented to person, place, and time.  Psychiatric:        Mood and Affect: Mood normal.        Thought Content: Thought content normal.     Labs reviewed: Basic Metabolic Panel: Recent Labs    11/18/21 0000 11/24/21 0818 02/03/22 0205  NA 134* 133* 130*  K 4.5 4.3 4.2  CL 97* 99 95*  CO2 24* 26 27  GLUCOSE  --  90 104*  BUN '13 14 8  '$ CREATININE 0.6 0.68 0.60  CALCIUM 9.1 9.1 9.7  TSH 1.41  --   --    Liver Function Tests: Recent Labs    11/18/21 0000 11/24/21 0818  AST 14 12*  ALT 18 11  ALKPHOS 67 54  BILITOT  --  0.5  PROT  --  6.9  ALBUMIN  --  4.4   No results for input(s): "LIPASE", "AMYLASE" in the last 8760 hours. No results for input(s): "AMMONIA" in the last 8760 hours. CBC: Recent Labs    11/18/21 0000 11/24/21 0818 02/03/22 0205  WBC 7.5 6.4 8.4  NEUTROABS  --  4.4 4.9  HGB 13.7 13.7 14.4  HCT  --  39.3 41.9  MCV  --  82.2 81.2  PLT 328 300 331   Lipid Panel: Recent Labs    11/18/21 0000  CHOL 152  HDL 55  LDLCALC 70  TRIG 136   No results found for: "HGBA1C"  Procedures since last visit: No results found.  Assessment/Plan 1. Primary hypertension Will add Norvasc 2.5 mg QD  split her BP meds Take Losartan and Norvasc in the morning And Tenormin and Losartan in the evening She had Amlodipine listed as allergy for Dizziness  She says  going up on tenormin also  caused her to feel dizzy She agreed to try Norvasc again Will follow next week     Labs/tests ordered:  * No order type specified * Next appt:  02/15/2022

## 2022-02-15 ENCOUNTER — Encounter: Payer: Self-pay | Admitting: Internal Medicine

## 2022-02-15 ENCOUNTER — Non-Acute Institutional Stay: Payer: PPO | Admitting: Internal Medicine

## 2022-02-15 VITALS — BP 142/76 | HR 72 | Temp 98.1°F | Resp 17 | Ht 62.0 in | Wt 128.4 lb

## 2022-02-15 DIAGNOSIS — H61002 Unspecified perichondritis of left external ear: Secondary | ICD-10-CM

## 2022-02-15 DIAGNOSIS — I10 Essential (primary) hypertension: Secondary | ICD-10-CM | POA: Diagnosis not present

## 2022-02-15 DIAGNOSIS — R053 Chronic cough: Secondary | ICD-10-CM

## 2022-02-16 ENCOUNTER — Other Ambulatory Visit (HOSPITAL_BASED_OUTPATIENT_CLINIC_OR_DEPARTMENT_OTHER): Payer: Self-pay

## 2022-02-16 MED ORDER — HYDROCOD POLI-CHLORPHE POLI ER 10-8 MG/5ML PO SUER
2.5000 mL | Freq: Every morning | ORAL | 0 refills | Status: DC
  Filled 2022-02-16: qty 70, 28d supply, fill #0

## 2022-02-16 MED ORDER — HYDROCORTISONE 1 % EX OINT
1.0000 | TOPICAL_OINTMENT | Freq: Two times a day (BID) | CUTANEOUS | 0 refills | Status: DC
  Filled 2022-02-16: qty 28, 30d supply, fill #0

## 2022-02-16 NOTE — Progress Notes (Signed)
Location: [facility] [place] of Service:  Clinic (12)  Provider:   Code Status:  Goals of Care:     02/15/2022    2:08 PM  Advanced Directives  Does Patient Have a Medical Advance Directive? Yes  Type of Paramedic of [place];Living will;Out of facility DNR (pink MOST or yellow form)  Does patient want to make changes to medical advance directive? No - Patient declined  Copy of [place] in Chart? Yes - validated most recent copy scanned in chart (See row information)     Chief Complaint  Patient presents with   Follow-up    Follow up on hypertension    HPI: Patient is a 88 y.o. female seen today for an acute visit for Follow up of her BP  [place]  Patient has a history of hypertension  Recently her blood pressure has been running high went to ED on 10/12.  Her blood work was within normal limits Sodium was 130  she was told to follow-up with the PCP She did increase her losartan to 100 mg from 50 mg. Her blood pressure still staying elevated Patient states that she feels heavy in her head.  Denies any chest pain  I also added Norvasc to her Meds Her BP is now much better but she is c/o Some dizziness more like feeling unsteady  Chronic Cough Was seen by Dr. Silas Flood and he thought it was because of postnasal drip.   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 Underwent left breast lumpectomy.  Was ER/PR positive and is now on letrozole per Dr. 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   Diverticulosis    Esophageal reflux    Family history of breast cancer 11/25/2021   Hiatal hernia    Hx of colonic polyps    Hypertension    Per new patient packet   Mild asthma    Per new patient packet   Osteoarthritis    Osteopenia    bone density 1-09 and 2-13   Pulmonary nodules    scattered-CT Scan July 2010, 12-10, and 04-2010-all stable felt benign     Past Surgical History:  Procedure Laterality Date   BREAST LUMPECTOMY WITH RADIOACTIVE SEED LOCALIZATION Left 12/07/2021   Procedure: LEFT BREAST LUMPECTOMY WITH RADIOACTIVE SEED LOCALIZATION;  Surgeon: Jovita Kussmaul, MD;  Location: [facility];  Service: General;  Laterality: Left;   CHOLECYSTECTOMY  04/25/1972   Gall Bladder; [facility]   DG PORTABLE CHEST X RAY ([place] HX)  2023   Shoulder   DIAGNOSTIC MAMMOGRAM  2022   [facility]; Per new patient packet   JOINT REPLACEMENT Left 08/24/2010   JOINT REPLACEMENT Left 04/25/2010   Reverse    orthroscopic  Rotator Cuff Left 04/25/2009   Partial shoulder Left 04/26/2003   ROTATOR CUFF REPAIR     x 2 1998 and 1999  SPINE SURGERY  04/26/1983   disk   TONSILLECTOMY  04/25/1950   Dr. Nyoka Cowden; Per new patient packet    Allergies  Allergen Reactions   Sulfa Antibiotics     Outpatient Encounter Medications as of 02/15/2022  Medication Sig   acetaminophen (TYLENOL) 500 MG tablet Take 500 mg by mouth as needed.   ALBUTEROL SULFATE IN Inhale 1 puff into the lungs daily as needed.   amLODipine (NORVASC) 2.5 MG tablet Take 1 tablet (2.5 mg total) by mouth daily.   atenolol (TENORMIN) 25 MG tablet Take 25 mg by mouth daily.   azelastine (ASTELIN) 0.1 % nasal spray Place 2 sprays into both nostrils 2 (two) times daily. Use in each nostril as directed   chlorpheniramine-HYDROcodone (TUSSIONEX) 10-8 MG/5ML Take 2.5 mLs by mouth every morning.   Cholecalciferol (D3 PO) Take 250 mcg by mouth daily.    cholestyramine (QUESTRAN) 4 g packet Take 1 packet by mouth daily.   Cyanocobalamin (B-12) 1000 MCG TABS Take by mouth daily.   dexlansoprazole (DEXILANT) 60 MG capsule Take 60 mg by mouth daily.   diclofenac Sodium (VOLTAREN) 1 % GEL Apply topically as needed.   fluticasone (FLONASE ALLERGY RELIEF) 50 MCG/ACT nasal spray Use 1 spray each nostril twice a day for 14 days then use 1 spray each nostril once daily thereafter   hydrocortisone 1 % ointment Apply 1 Application topically 2 (two) times daily.   letrozole (FEMARA) 2.5 MG tablet Take 1 tablet (2.5 mg total) by mouth daily.   lidocaine (SALONPAS PAIN RELIEVING) 4 % Place onto the skin as needed.   Loperamide-Simethicone 2-125 MG TABS Take 1 tablet by mouth as needed.   losartan (COZAAR) 50 MG tablet Take 1 tablet (50 mg total) by mouth 2 (two) times daily.   montelukast (SINGULAIR) 10 MG tablet Take 1 tablet (10 mg total) by mouth at bedtime.   No facility-administered encounter medications on file as of 02/15/2022.    Review of Systems:  Review of Systems  Constitutional:  Negative for activity change and appetite change.  HENT: Negative.    Respiratory:  Positive for cough. Negative for shortness of breath.   Cardiovascular:  Negative for leg swelling.  Gastrointestinal:  Negative for constipation.  Genitourinary: Negative.   Musculoskeletal:  Negative for arthralgias, gait problem and myalgias.  Skin:  Positive for wound.  Neurological:  Negative for dizziness and weakness.  Psychiatric/Behavioral:  Negative for confusion, dysphoric mood and sleep disturbance.     Health Maintenance  Topic Date Due   TETANUS/TDAP  Never done   Pneumonia Vaccine 43+ Years old (2 - PCV) 11/10/2021   INFLUENZA VACCINE  11/23/2021   COVID-19 Vaccine (5 - Moderna risk series) 02/05/2022   Medicare Annual Wellness (AWV)  10/03/2022   DEXA SCAN  Completed   Zoster Vaccines- Shingrix  Completed   HPV VACCINES  Aged Out    Physical  Exam: Vitals:   02/15/22 1409  BP: (!) 142/76  Pulse: 72  Resp: 17  Temp: 98.1 F (36.7 C)  TempSrc: Temporal  SpO2: 92%  Weight: 128 lb 6.4 oz (58.2 kg)  Height: '5\' 2"'$  (1.575 m)   Body mass index is 23.48 kg/m. Physical Exam Vitals reviewed.  Constitutional:      Appearance: Normal appearance.  HENT:     Head: Normocephalic.     Nose: Nose normal.     Mouth/Throat:     Mouth: Mucous membranes are moist.     Pharynx: Oropharynx is clear.  Eyes:     Pupils: Pupils are equal, round, and reactive to light.  Cardiovascular:     Rate and Rhythm: Normal rate and regular rhythm.     Pulses: Normal pulses.     Heart sounds: Normal heart sounds. No murmur heard. Pulmonary:     Effort: Pulmonary effort is normal.     Breath sounds: Normal breath sounds.  Abdominal:     General: Abdomen is flat. Bowel sounds are normal.     Palpations: Abdomen is soft.  Musculoskeletal:        General: No swelling.     Cervical back: Neck supple.  Skin:    General: Skin is warm.     Comments: Has small Painful Ganglion in left  Ear Pinna   Neurological:     General: No focal deficit present.     Mental Status: She is alert and oriented to person, place, and time.  Psychiatric:        Mood and Affect: Mood normal.        Thought Content: Thought content normal.     Labs reviewed: Basic Metabolic Panel: Recent Labs    11/18/21 0000 11/24/21 0818 02/03/22 0205  NA 134* 133* 130*  K 4.5 4.3 4.2  CL 97* 99 95*  CO2 24* 26 27  GLUCOSE  --  90 104*  BUN '13 14 8  '$ CREATININE 0.6 0.68 0.60  CALCIUM 9.1 9.1 9.7  TSH 1.41  --   --    Liver Function Tests: Recent Labs    11/18/21 0000 11/24/21 0818  AST 14 12*  ALT 18 11  ALKPHOS 67 54  BILITOT  --  0.5  PROT  --  6.9  ALBUMIN  --  4.4   No results for input(s): "LIPASE", "AMYLASE" in the last 8760 hours. No results for input(s): "AMMONIA" in the last 8760 hours. CBC: Recent Labs    11/18/21 0000 11/24/21 0818  02/03/22 0205  WBC 7.5 6.4 8.4  NEUTROABS  --  4.4 4.9  HGB 13.7 13.7 14.4  HCT  --  39.3 41.9  MCV  --  82.2 81.2  PLT 328 300 331   Lipid Panel: Recent Labs    11/18/21 0000  CHOL 152  HDL 55  LDLCALC 70  TRIG 136   No results found for: "HGBA1C"  Procedures since last visit: No results found.  Assessment/Plan 1. Primary hypertension On Tenormin and Losartan Also on Norvasc BP better but she is little dizzy Will give her some more time to get use to the Meds If not better will have to stop Norvasc BP at home are doing well also  2. 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

## 2022-02-17 ENCOUNTER — Other Ambulatory Visit (HOSPITAL_BASED_OUTPATIENT_CLINIC_OR_DEPARTMENT_OTHER): Payer: Self-pay

## 2022-03-01 ENCOUNTER — Telehealth: Payer: Self-pay | Admitting: Pulmonary Disease

## 2022-03-01 DIAGNOSIS — M5416 Radiculopathy, lumbar region: Secondary | ICD-10-CM | POA: Diagnosis not present

## 2022-03-01 NOTE — Telephone Encounter (Signed)
Primary Pulmonologist: [facility] Last office visit and with whom: 02/04/2022 [facility] What do we see them for (pulmonary problems): Chronic cough Last OV assessment/plan:    Assessment & Plan:    Chronic cough: Present for many months.  Not improved with addition of PPI.  Does not have breakthrough GERD.  Denies history of asthma or atopy.  No shortness of breath.  There is no significant nasal congestion and sensation of postnasal drip.  Add montelukast 1 pill nightly.  In addition, increase Flonase to 1 spray twice daily for 2 weeks then back down to daily, add azelastine spray 2 sprays twice daily.  Prescription for all 3 sent today.  Assess response.  Consider ICS/LABA therapy (mild hyperinflation on chest x-ray 1/22) in the future although current symptoms point more to postnasal drip.     Return in about 3 months (around 05/07/2022).     Lanier Clam, MD 02/04/2022          Patient Instructions by Lanier Clam, MD at 02/04/2022 2:30 PM  Author: Lanier Clam, MD Author Type: Physician Filed: 02/04/2022  2:57 PM  Note Status: Signed Cosign: Cosign Not Required Encounter Date: 02/04/2022  Editor: Lanier Clam, MD (Physician)             Nice to meet you   I recommend taking Flonase 1 spray each nostril twice a day for 2 weeks.  After 2 weeks you can go to taking it 1 spray each nostril once a day.   In addition, use azelastine nasal spray 2 sprays each nostril twice a day.  Every day.   Lastly, take montelukast 1 pill at night, every night.   Lets see if this helps with the nasal congestion, the dripping of mucus down the back of the throat, and improves the cough.   Return to clinic in 3 months or sooner as needed with Dr. Silas Flood       Orthostatic Vitals Recorded in This Encounter   02/04/2022 1432     Patient Position: Sitting  BP Location: Left Arm  Cuff Size: Normal   Instructions   Return in about 3 months (around  05/07/2022). Nice to meet you   I recommend taking Flonase 1 spray each nostril twice a day for 2 weeks.  After 2 weeks you can go to taking it 1 spray each nostril once a day.   In addition, use azelastine nasal spray 2 sprays each nostril twice a day.  Every day.   Lastly, take montelukast 1 pill at night, every night.   Lets see if this helps with the nasal congestion, the dripping of mucus down the back of the throat, and improves the cough.   Return to clinic in 3 months or sooner as needed with Dr. Silas Flood     Was appointment offered to patient (explain)?  No, chronic cough   Reason for call: Cough is not better with Singular.  Dr. Lyndel Safe prescribed Tussionex and that did not help either and she does not want to take a narcotic.  She is on Losartan for her BP and she is wondering if that may be a cause.  She mentioned to Dr. Lyndel Safe that she is having dizziness with it and she said it may take some time for her body to get used to the medication.  Dr. Silas Flood, any recommendations regarding cough?  Please advise.  Thank you.  (examples of things to ask: : When did symptoms start? Fever? Cough? Productive? Color  to sputum? More sputum than usual? Wheezing? Have you needed increased oxygen? Are you taking your respiratory medications? What over the counter measures have you tried?)  Allergies  Allergen Reactions   Sulfa Antibiotics     Immunization History  Administered Date(s) Administered   Influenza Split 01/10/2011, 12/27/2011, 01/09/2012, 01/26/2015, 12/17/2017, 01/12/2019, 12/19/2019, 12/19/2020   Moderna Covid-19 Vaccine Bivalent Booster 75yr & up 02/05/2021, 09/27/2021   Moderna SARS-COV2 Booster Vaccination 03/10/2020, 11/24/2020, 12/11/2021   Moderna Sars-Covid-2 Vaccination 05/07/2019, 06/04/2019   Pneumococcal Polysaccharide-23 07/21/2020, 11/10/2020   Zoster Recombinat (Shingrix) 09/06/2016, 11/22/2016

## 2022-03-02 ENCOUNTER — Other Ambulatory Visit (HOSPITAL_COMMUNITY): Payer: Self-pay

## 2022-03-02 NOTE — Telephone Encounter (Signed)
Per benefits investigation the most cost effective ICS/LABA would be Advair Diskus for a $12.00 co-pay at this time.

## 2022-03-02 NOTE — Telephone Encounter (Signed)
Advair diskus 500 1 puff BID, please prescribe. Thanks!

## 2022-03-02 NOTE — Telephone Encounter (Signed)
Has been challenging to improve. Did she use the nasal sprays as instructed with the montelukast?   Can try ICS/LABA therapy - can we check most cost effective ICS/LABA and then prescribe and give instructions to patient?

## 2022-03-02 NOTE — Telephone Encounter (Signed)
Called and left voicemail for patient to call office back to go over recommendations from [facility] about inhaler.

## 2022-03-07 ENCOUNTER — Encounter: Payer: Self-pay | Admitting: Internal Medicine

## 2022-03-07 DIAGNOSIS — L570 Actinic keratosis: Secondary | ICD-10-CM | POA: Diagnosis not present

## 2022-03-07 DIAGNOSIS — Z85828 Personal history of other malignant neoplasm of skin: Secondary | ICD-10-CM | POA: Diagnosis not present

## 2022-03-08 ENCOUNTER — Encounter: Payer: PPO | Admitting: Internal Medicine

## 2022-03-12 ENCOUNTER — Other Ambulatory Visit: Payer: Self-pay | Admitting: Internal Medicine

## 2022-03-12 ENCOUNTER — Other Ambulatory Visit (HOSPITAL_BASED_OUTPATIENT_CLINIC_OR_DEPARTMENT_OTHER): Payer: Self-pay

## 2022-03-12 MED ORDER — AMLODIPINE BESYLATE 2.5 MG PO TABS
2.5000 mg | ORAL_TABLET | Freq: Every day | ORAL | 0 refills | Status: DC
  Filled 2022-03-12: qty 30, 30d supply, fill #0

## 2022-03-12 MED ORDER — LOSARTAN POTASSIUM 50 MG PO TABS
50.0000 mg | ORAL_TABLET | Freq: Two times a day (BID) | ORAL | 0 refills | Status: DC
  Filled 2022-04-06: qty 60, 30d supply, fill #0

## 2022-03-14 ENCOUNTER — Other Ambulatory Visit (HOSPITAL_BASED_OUTPATIENT_CLINIC_OR_DEPARTMENT_OTHER): Payer: Self-pay

## 2022-03-14 MED ORDER — MIRTAZAPINE 15 MG PO TABS
7.5000 mg | ORAL_TABLET | Freq: Every evening | ORAL | 4 refills | Status: DC | PRN
  Filled 2022-03-14: qty 45, 90d supply, fill #0
  Filled 2022-06-07: qty 45, 90d supply, fill #1
  Filled 2022-09-07 – 2022-09-08 (×2): qty 45, 90d supply, fill #2

## 2022-03-15 ENCOUNTER — Encounter: Payer: Self-pay | Admitting: Internal Medicine

## 2022-03-15 ENCOUNTER — Other Ambulatory Visit (HOSPITAL_BASED_OUTPATIENT_CLINIC_OR_DEPARTMENT_OTHER): Payer: Self-pay

## 2022-03-15 ENCOUNTER — Non-Acute Institutional Stay: Payer: PPO | Admitting: Internal Medicine

## 2022-03-15 VITALS — BP 158/84 | HR 84 | Temp 98.1°F | Resp 17 | Ht 62.0 in | Wt 128.0 lb

## 2022-03-15 DIAGNOSIS — E2839 Other primary ovarian failure: Secondary | ICD-10-CM

## 2022-03-15 DIAGNOSIS — D0512 Intraductal carcinoma in situ of left breast: Secondary | ICD-10-CM

## 2022-03-15 DIAGNOSIS — R053 Chronic cough: Secondary | ICD-10-CM | POA: Diagnosis not present

## 2022-03-15 DIAGNOSIS — I10 Essential (primary) hypertension: Secondary | ICD-10-CM | POA: Diagnosis not present

## 2022-03-15 MED ORDER — HYDROCODONE BIT-HOMATROP MBR 5-1.5 MG/5ML PO SOLN
5.0000 mL | Freq: Four times a day (QID) | ORAL | 0 refills | Status: DC | PRN
  Filled 2022-03-15: qty 120, 6d supply, fill #0

## 2022-03-15 NOTE — Patient Instructions (Signed)
I am Ordering DEXA in [place]

## 2022-03-15 NOTE — Progress Notes (Signed)
Location: [facility] of Service:  Clinic (12)  Provider:   Code Status:  Goals of Care:     02/15/2022    2:08 PM  Advanced Directives  Does Patient Have a Medical Advance Directive? Yes  Type of Paramedic of [place];Living will;Out of facility DNR (pink MOST or yellow form)  Does patient want to make changes to medical advance directive? No - Patient declined  Copy of [place] in Chart? Yes - validated most recent copy scanned in chart (See row information)     Chief Complaint  Patient presents with   Medical Management of Chronic Issues    4 month follow up    HPI: Patient is a 88 y.o. female seen today for an acute visit for Follow up of her BP  [place]   Patient has a history of hypertension  BP is better but her Home BP is still running high Some of her SBP are 160  Patient very concerned about it She also thinks Losartan is giving her Cough But she does not want to change her meds right now Night sweats For past few weeks Other issues Chronic Cough Was seen by Dr. Silas Flood and he thought it was because of postnasal drip.   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 Underwent left breast lumpectomy.  Was ER/PR positive and is now on letrozole per Dr. Benay Spice Past Medical History:  Diagnosis Date   Breast cancer ([facility]) 10/2021   left breast DCIS   Diverticulosis    Esophageal reflux    Family history of breast cancer 11/25/2021   Hiatal hernia    Hx of colonic polyps    Hypertension    Per new patient packet   Mild asthma    Per new patient packet   Osteoarthritis    Osteopenia    bone density 1-09 and 2-13   Pulmonary nodules    scattered-CT Scan July 2010, 12-10, and  04-2010-all stable felt benign     Past Surgical History:  Procedure Laterality Date   BREAST LUMPECTOMY WITH RADIOACTIVE SEED LOCALIZATION Left 12/07/2021   Procedure: LEFT BREAST LUMPECTOMY WITH RADIOACTIVE SEED LOCALIZATION;  Surgeon: Jovita Kussmaul, MD;  Location: [facility];  Service: General;  Laterality: Left;   CHOLECYSTECTOMY  04/25/1972   Gall Bladder; [facility]   DG PORTABLE CHEST X RAY ([facility] HX)  2023   Shoulder   DIAGNOSTIC MAMMOGRAM  2022   [facility]; Per new patient packet   JOINT REPLACEMENT Left 08/24/2010   JOINT REPLACEMENT Left 04/25/2010   Reverse    orthroscopic  Rotator Cuff Left 04/25/2009   Partial shoulder Left 04/26/2003   ROTATOR CUFF REPAIR     x 2 1998 and 1999   SPINE SURGERY  04/26/1983   disk   TONSILLECTOMY  04/25/1950   Dr. Nyoka Cowden; Per new patient packet    Allergies  Allergen Reactions   Sulfa Antibiotics     Outpatient Encounter Medications as of 03/15/2022  Medication Sig   acetaminophen (TYLENOL) 500 MG tablet Take 500 mg by mouth as needed.   ALBUTEROL SULFATE IN Inhale 1 puff into the lungs daily as needed.   amLODipine (NORVASC)  2.5 MG tablet Take 1 tablet (2.5 mg total) by mouth daily.   atenolol (TENORMIN) 25 MG tablet Take 50 mg by mouth daily.   azelastine (ASTELIN) 0.1 % nasal spray Place 2 sprays into both nostrils 2 (two) times daily. Use in each nostril as directed   Cholecalciferol (D3 PO) Take 250 mcg by mouth daily.   Cyanocobalamin (B-12) 1000 MCG TABS Take by mouth daily.   dexlansoprazole (DEXILANT) 60 MG capsule Take 60 mg by mouth daily.   diclofenac Sodium (VOLTAREN) 1 % GEL Apply topically as needed.   fluticasone (FLONASE ALLERGY RELIEF) 50 MCG/ACT nasal spray Use 1 spray each nostril twice a day for 14 days then use 1 spray each nostril once daily thereafter   HYDROcodone bit-homatropine (HYCODAN) 5-1.5 MG/5ML syrup Take 5 mLs by mouth every 6 (six) hours as needed for cough.   hydrocortisone 1 %  ointment Apply 1 Application topically 2 (two) times daily.   cholestyramine (QUESTRAN) 4 g packet Take 1 packet by mouth daily.   letrozole (FEMARA) 2.5 MG tablet Take 1 tablet (2.5 mg total) by mouth daily.   lidocaine (SALONPAS PAIN RELIEVING) 4 % Place onto the skin as needed.   Loperamide-Simethicone 2-125 MG TABS Take 1 tablet by mouth as needed.   losartan (COZAAR) 50 MG tablet Take 1 tablet (50 mg total) by mouth 2 (two) times daily.   mirtazapine (REMERON) 15 MG tablet Take 0.5 tablets (7.5 mg total) by mouth at bedtime as needed for sleep.   montelukast (SINGULAIR) 10 MG tablet Take 1 tablet (10 mg total) by mouth at bedtime.   [DISCONTINUED] chlorpheniramine-HYDROcodone (TUSSIONEX) 10-8 MG/5ML Take 2.5 mLs by mouth every morning.   [DISCONTINUED] losartan (COZAAR) 50 MG tablet Take 1 tablet (50 mg total) by mouth 2 (two) times daily.   No facility-administered encounter medications on file as of 03/15/2022.    Review of Systems:  Review of Systems  Constitutional:  Negative for activity change and appetite change.  HENT: Negative.    Respiratory:  Positive for cough. Negative for shortness of breath.   Cardiovascular:  Negative for leg swelling.  Gastrointestinal:  Negative for constipation.  Genitourinary: Negative.   Musculoskeletal:  Negative for arthralgias, gait problem and myalgias.  Skin: Negative.   Neurological:  Negative for dizziness and weakness.  Psychiatric/Behavioral:  Negative for confusion, dysphoric mood and sleep disturbance.     Health Maintenance  Topic Date Due   Pneumonia Vaccine 38+ Years old (2 - PCV) 11/10/2021   INFLUENZA VACCINE  11/23/2021   COVID-19 Vaccine (5 - 2023-24 season) 02/05/2022   Medicare Annual Wellness (AWV)  09/03/2022   DEXA SCAN  Completed   Zoster Vaccines- Shingrix  Completed   HPV VACCINES  Aged Out    Physical Exam: Vitals:   03/15/22 0907  BP: (!) 158/84  Pulse: 84  Resp: 17  Temp: 98.1 F (36.7 C)  TempSrc:  Temporal  SpO2: 97%  Weight: 128 lb (58.1 kg)  Height: '5\' 2"'$  (1.575 m)   Body mass index is 23.41 kg/m. Physical Exam Vitals reviewed.  Constitutional:      Appearance: Normal appearance.  HENT:     Head: Normocephalic.     Nose: Nose normal.     Mouth/Throat:     Mouth: Mucous membranes are moist.     Pharynx: Oropharynx is clear.  Eyes:     Pupils: Pupils are equal, round, and reactive to light.  Cardiovascular:     Rate and Rhythm: Normal  rate and regular rhythm.     Pulses: Normal pulses.     Heart sounds: Normal heart sounds. No murmur heard. Pulmonary:     Effort: Pulmonary effort is normal.     Breath sounds: Normal breath sounds.  Abdominal:     General: Abdomen is flat. Bowel sounds are normal.     Palpations: Abdomen is soft.  Musculoskeletal:        General: No swelling.     Cervical back: Neck supple.  Skin:    General: Skin is warm.  Neurological:     General: No focal deficit present.     Mental Status: She is alert and oriented to person, place, and time.  Psychiatric:        Mood and Affect: Mood normal.        Thought Content: Thought content normal.     Labs reviewed: Basic Metabolic Panel: Recent Labs    11/18/21 0000 11/24/21 0818 02/03/22 0205  NA 134* 133* 130*  K 4.5 4.3 4.2  CL 97* 99 95*  CO2 24* 26 27  GLUCOSE  --  90 104*  BUN '13 14 8  '$ CREATININE 0.6 0.68 0.60  CALCIUM 9.1 9.1 9.7  TSH 1.41  --   --    Liver Function Tests: Recent Labs    11/18/21 0000 11/24/21 0818  AST 14 12*  ALT 18 11  ALKPHOS 67 54  BILITOT  --  0.5  PROT  --  6.9  ALBUMIN  --  4.4   No results for input(s): "LIPASE", "AMYLASE" in the last 8760 hours. No results for input(s): "AMMONIA" in the last 8760 hours. CBC: Recent Labs    11/18/21 0000 11/24/21 0818 02/03/22 0205  WBC 7.5 6.4 8.4  NEUTROABS  --  4.4 4.9  HGB 13.7 13.7 14.4  HCT  --  39.3 41.9  MCV  --  82.2 81.2  PLT 328 300 331   Lipid Panel: Recent Labs    11/18/21 0000   CHOL 152  HDL 55  LDLCALC 70  TRIG 136   No results found for: "HGBA1C"  Procedures since last visit: No results found.  Assessment/Plan 1. Primary hypertension Change Atenolol to 50 mg QD Continue on Losartan and Norvasc I offered to change her Losartan but she does not want to do it right now 2. Chronic cough Continues to be her issue Has Follow up with Dr Silas Flood I am also calling in Hydrocodone Syrup for occasional use  3. Ductal carcinoma in situ (DCIS) of left breast On Femara Can cause Night sweats   4. Estrogen deficiency DEXA ordered as she is on Femara    Labs/tests ordered:  * No order type specified * Next appt:  04/12/2022

## 2022-03-16 ENCOUNTER — Encounter: Payer: PPO | Admitting: Internal Medicine

## 2022-03-21 ENCOUNTER — Telehealth: Payer: Self-pay

## 2022-03-21 NOTE — Telephone Encounter (Signed)
Left message on voicemail for patient informing her of Dr.Gupta's reply and instructed her to return call to schedule an appointment for tomorrow

## 2022-03-21 NOTE — Telephone Encounter (Signed)
Patient called to express her concern of dizziness, overall not feeling well, and ears stopped up. Patient believes there is a correlation with the losartan that has onset these symptoms. Patient is taking losartan in the am. Patient would like Dr.Gupta's expert advice on what she needs to do about concerns.  Last Appointment: 03/15/2022 Next Appointment 04/12/2022  Please advise

## 2022-03-21 NOTE — Telephone Encounter (Signed)
Just add her to my schedule for tomorrow

## 2022-03-22 ENCOUNTER — Non-Acute Institutional Stay: Payer: PPO | Admitting: Internal Medicine

## 2022-03-22 ENCOUNTER — Inpatient Hospital Stay: Payer: PPO | Attending: Oncology | Admitting: Oncology

## 2022-03-22 ENCOUNTER — Encounter: Payer: Self-pay | Admitting: Internal Medicine

## 2022-03-22 VITALS — BP 156/73 | HR 83 | Temp 98.1°F | Resp 20 | Ht 62.0 in | Wt 127.6 lb

## 2022-03-22 VITALS — BP 144/76 | HR 75 | Temp 97.8°F | Resp 17 | Ht 62.0 in | Wt 128.2 lb

## 2022-03-22 DIAGNOSIS — D0512 Intraductal carcinoma in situ of left breast: Secondary | ICD-10-CM | POA: Diagnosis not present

## 2022-03-22 DIAGNOSIS — K219 Gastro-esophageal reflux disease without esophagitis: Secondary | ICD-10-CM | POA: Diagnosis not present

## 2022-03-22 DIAGNOSIS — R42 Dizziness and giddiness: Secondary | ICD-10-CM

## 2022-03-22 DIAGNOSIS — I10 Essential (primary) hypertension: Secondary | ICD-10-CM | POA: Diagnosis not present

## 2022-03-22 DIAGNOSIS — R61 Generalized hyperhidrosis: Secondary | ICD-10-CM | POA: Insufficient documentation

## 2022-03-22 NOTE — Progress Notes (Signed)
  [facility] OFFICE PROGRESS NOTE   Diagnosis: DCIS  INTERVAL HISTORY:   Tammy Lindsey returns as scheduled.  She continues letrozole.  No hot flashes or arthralgias.  She reports "night sweats "intermittently for the past 2 weeks.  No palpable change in either breast.  She is concerned her blood pressure is elevated.  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  The ear has not completely healed.  Objective:  Vital signs in last 24 hours:  Blood pressure (!) 156/73, pulse 83, temperature 98.1 F (36.7 C), temperature source Oral, resp. rate 20, height _0  (1.575 m), weight 127 lb 9.6 oz (57.9 kg), SpO2 100 %.    HEENT: Neck without mass Lymphatics: No cervical, supraclavicular, or axillary nodes Resp: Clear bilaterally Cardio: Regular rate and rhythm GI: No hepatosplenomegaly Vascular: No leg edema Breast: Status post left lumpectomy.  No evidence for local tumor recurrence at the left areola.  No mass in either breast. Skin: Erythematous scabbed area at the inner aspect of the left posterior ear.   Lab Results:  Lab Results  Component Value Date   WBC 8.4 02/03/2022   HGB 14.4 02/03/2022   HCT 41.9 02/03/2022   MCV 81.2 02/03/2022   PLT 331 02/03/2022   NEUTROABS 4.9 02/03/2022    CMP  Lab Results  Component Value Date   NA 130 (L) 02/03/2022   K 4.2 02/03/2022   CL 95 (L) 02/03/2022   CO2 27 02/03/2022   GLUCOSE 104 (H) 02/03/2022   BUN 8 02/03/2022   CREATININE 0.60 02/03/2022   CALCIUM 9.7 02/03/2022   PROT 6.9 11/24/2021   ALBUMIN 4.4 11/24/2021   AST 12 (L) 11/24/2021   ALT 11 11/24/2021   ALKPHOS 54 11/24/2021   BILITOT 0.5 11/24/2021   GFRNONAA >60 02/03/2022   GFRAA  07/24/2010    >60        The eGFR has been calculated using the MDRD equation. This calculation has not been validated in all clinical situations. eGFR's persistently <60 mL/min signify possible Chronic Kidney Disease.     Medications: I have reviewed the  patient's current medications.   Assessment/Plan: Left breast DCIS Screening mammogram 11/04/2021-calcifications in the central left breast Diagnostic left mammogram 719 2023-1.3 cm group of calcifications in the left breast suspicious for malignancy Stereotactic biopsy 11/16/2021-DCIS, intermediate nuclear grade, 0.6 cm, necrosis present, calcifications present, ER 100%, PR 70% Left lumpectomy 12/07/2021-small residual focus of DCIS in vicinity of biopsy site, negative margins.  All margins negative for DCIS by at least 10 mm Letrozole 12/20/2021 Gastroesophageal reflux Chronic nonproductive cough C. difficile January 2023 Family history of breast cancer-negative genetic testing     Disposition: Tammy Lindsey has a history of left DCIS.  She continues adjuvant letrozole.  She is tolerating letrozole well.  She will return for an office visit in 6 months.  She will be due for a mammogram in July 2024. She will follow-up with dermatology for the skin cancer biopsy site.  She plans to see Dr. Lyndel Safe later today.  She will follow-up with Dr. Lyndel Safe if the night sweats do not resolve.  Betsy Coder, MD  03/22/2022  11:23 AM

## 2022-03-22 NOTE — Patient Instructions (Signed)
Call my office if your BP running more then 170/90 on Friday.

## 2022-03-23 NOTE — Progress Notes (Signed)
Location: [facility] of Service:  Clinic (12)  Provider:   Code Status:  Goals of Care:     03/22/2022   11:35 AM  Advanced Directives  Does Patient Have a Medical Advance Directive? Yes  Type of Paramedic of [place];Living will  Does patient want to make changes to medical advance directive? No - Patient declined  Copy of [place] in Chart? Yes - validated most recent copy scanned in chart (See row information)     Chief Complaint  Patient presents with   Acute Visit    Patient states she has had some medication concerns     HPI: Patient is a 88 y.o. female seen today for an acute visit for Feels Funny in her head and Unstable   [place]   Patient has a history of hypertension  Was started on Norvasc due to High BP Says since then she feels funny in her head Unstable and Dizzy like she is going to fall She has h/o Dizziness in the past on Norvasc but we thought we will try low dose Her Home BP are from SBP 140- 164  Other issues Night sweats For past few weeks  Chronic Cough Was seen by Dr. Silas Flood and he thought it was because of postnasal drip.   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 Underwent left breast lumpectomy.  Was ER/PR positive and is now on letrozole per Dr. Benay Spice Past Medical History:  Diagnosis Date   Breast cancer ([facility]) 10/2021   left breast DCIS   Diverticulosis    Esophageal reflux    Family history of breast cancer 11/25/2021   Hiatal hernia    Hx of colonic polyps    Hypertension    Per new patient packet   Mild asthma    Per new patient packet   Osteoarthritis    Osteopenia    bone density 1-09 and 2-13   Pulmonary nodules    scattered-CT Scan July  2010, 12-10, and 04-2010-all stable felt benign     Past Surgical History:  Procedure Laterality Date   BREAST LUMPECTOMY WITH RADIOACTIVE SEED LOCALIZATION Left 12/07/2021   Procedure: LEFT BREAST LUMPECTOMY WITH RADIOACTIVE SEED LOCALIZATION;  Surgeon: Jovita Kussmaul, MD;  Location: [facility];  Service: General;  Laterality: Left;   CHOLECYSTECTOMY  04/25/1972   Gall Bladder; [facility]   DG PORTABLE CHEST X RAY ([facility] HX)  2023   Shoulder   DIAGNOSTIC MAMMOGRAM  2022   [facility]; Per new patient packet   JOINT REPLACEMENT Left 08/24/2010   JOINT REPLACEMENT Left 04/25/2010   Reverse    orthroscopic  Rotator Cuff Left 04/25/2009   Partial shoulder Left 04/26/2003   ROTATOR CUFF REPAIR     x 2 1998 and 1999   SPINE SURGERY  04/26/1983   disk   TONSILLECTOMY  04/25/1950   Dr. Nyoka Cowden; Per new patient packet    Allergies  Allergen Reactions   Sulfa Antibiotics     Outpatient Encounter Medications as of 03/22/2022  Medication Sig   acetaminophen (TYLENOL) 500 MG tablet Take 500 mg by mouth as needed.   ALBUTEROL SULFATE IN Inhale 1 puff into the  lungs daily as needed.   atenolol (TENORMIN) 25 MG tablet Take 50 mg by mouth daily.   azelastine (ASTELIN) 0.1 % nasal spray Place 2 sprays into both nostrils 2 (two) times daily. Use in each nostril as directed   Cholecalciferol (D3 PO) Take 250 mcg by mouth daily.   Cyanocobalamin (B-12) 1000 MCG TABS Take by mouth daily.   dexlansoprazole (DEXILANT) 60 MG capsule Take 60 mg by mouth daily.   diclofenac Sodium (VOLTAREN) 1 % GEL Apply topically as needed.   fluticasone (FLONASE ALLERGY RELIEF) 50 MCG/ACT nasal spray Use 1 spray each nostril twice a day for 14 days then use 1 spray each nostril once daily thereafter   hydrocortisone 1 % ointment Apply 1 Application topically 2 (two) times daily. (Patient taking differently: Apply 1 Application topically 2 (two) times daily as needed for itching.)   letrozole (FEMARA) 2.5  MG tablet Take 1 tablet (2.5 mg total) by mouth daily.   lidocaine (SALONPAS PAIN RELIEVING) 4 % Place onto the skin as needed.   losartan (COZAAR) 50 MG tablet Take 1 tablet (50 mg total) by mouth 2 (two) times daily.   mirtazapine (REMERON) 15 MG tablet Take 0.5 tablets (7.5 mg total) by mouth at bedtime as needed for sleep.   montelukast (SINGULAIR) 10 MG tablet Take 1 tablet (10 mg total) by mouth at bedtime.   [DISCONTINUED] amLODipine (NORVASC) 2.5 MG tablet Take 1 tablet (2.5 mg total) by mouth daily.   Loperamide-Simethicone 2-125 MG TABS Take 1 tablet by mouth as needed.   [DISCONTINUED] HYDROcodone bit-homatropine (HYCODAN) 5-1.5 MG/5ML syrup Take 5 mLs by mouth every 6 (six) hours as needed for cough. (Patient not taking: Reported on 03/22/2022)   No facility-administered encounter medications on file as of 03/22/2022.    Review of Systems:  Review of Systems  Constitutional:  Negative for activity change and appetite change.  HENT: Negative.    Respiratory:  Positive for cough. Negative for shortness of breath.   Cardiovascular:  Negative for leg swelling.  Gastrointestinal:  Negative for constipation.  Genitourinary: Negative.   Musculoskeletal:  Negative for arthralgias, gait problem and myalgias.  Skin: Negative.   Neurological:  Positive for dizziness. Negative for weakness.  Psychiatric/Behavioral:  Negative for confusion, dysphoric mood and sleep disturbance.     Health Maintenance  Topic Date Due   Pneumonia Vaccine 55+ Years old (2 - PCV) 11/10/2021   COVID-19 Vaccine (5 - 2023-24 season) 02/05/2022   Medicare Annual Wellness (AWV)  09/03/2022   INFLUENZA VACCINE  Completed   DEXA SCAN  Completed   Zoster Vaccines- Shingrix  Completed   HPV VACCINES  Aged Out    Physical Exam: Vitals:   03/22/22 1422  BP: (!) 144/76  Pulse: 75  Resp: 17  Temp: 97.8 F (36.6 C)  TempSrc: Temporal  SpO2: 96%  Weight: 128 lb 3.2 oz (58.2 kg)  Height: '5\' 2"'$  (1.575 m)    Body mass index is 23.45 kg/m. Physical Exam Vitals reviewed.  Constitutional:      Appearance: Normal appearance.  HENT:     Head: Normocephalic.     Ears:     Comments: Wears Scientist, forensic . Some wax in the ear     Nose: Nose normal.     Mouth/Throat:     Mouth: Mucous membranes are moist.     Pharynx: Oropharynx is clear.  Eyes:     Pupils: Pupils are equal, round, and reactive to light.  Cardiovascular:  Rate and Rhythm: Normal rate and regular rhythm.     Pulses: Normal pulses.     Heart sounds: Normal heart sounds. No murmur heard. Pulmonary:     Effort: Pulmonary effort is normal.     Breath sounds: Normal breath sounds.  Abdominal:     General: Abdomen is flat. Bowel sounds are normal.     Palpations: Abdomen is soft.  Musculoskeletal:        General: No swelling.     Cervical back: Neck supple.     Comments: Feels unstable when stands up  Skin:    General: Skin is warm.  Neurological:     General: No focal deficit present.     Mental Status: She is alert and oriented to person, place, and time.  Psychiatric:        Mood and Affect: Mood normal.        Thought Content: Thought content normal.     Labs reviewed: Basic Metabolic Panel: Recent Labs    11/18/21 0000 11/24/21 0818 02/03/22 0205  NA 134* 133* 130*  K 4.5 4.3 4.2  CL 97* 99 95*  CO2 24* 26 27  GLUCOSE  --  90 104*  BUN '13 14 8  '$ CREATININE 0.6 0.68 0.60  CALCIUM 9.1 9.1 9.7  TSH 1.41  --   --    Liver Function Tests: Recent Labs    11/18/21 0000 11/24/21 0818  AST 14 12*  ALT 18 11  ALKPHOS 67 54  BILITOT  --  0.5  PROT  --  6.9  ALBUMIN  --  4.4   No results for input(s): "LIPASE", "AMYLASE" in the last 8760 hours. No results for input(s): "AMMONIA" in the last 8760 hours. CBC: Recent Labs    11/18/21 0000 11/24/21 0818 02/03/22 0205  WBC 7.5 6.4 8.4  NEUTROABS  --  4.4 4.9  HGB 13.7 13.7 14.4  HCT  --  39.3 41.9  MCV  --  82.2 81.2  PLT 328 300 331   Lipid  Panel: Recent Labs    11/18/21 0000  CHOL 152  HDL 55  LDLCALC 70  TRIG 136   No results found for: "HGBA1C"  Procedures since last visit: No results found.  Assessment/Plan 1. Primary hypertension She does have h/o feeling dizzy  on Norvasc before Will Discontinue it for now Continue Tenormin and Losartan She also thinks losartan makes her Cough worse We discussed about changing it next visit See her next week   Other issues  Chronic cough Continues to be her issue Has Follow up with Dr Silas Flood  Hydrocodone Syrup for occasional use   3. Ductal carcinoma in situ (DCIS) of left breast On Femara    4. Estrogen deficiency DEXA ordered as she is on Femara   Labs/tests ordered:  * No order type specified * Next appt:  03/29/2022

## 2022-03-29 ENCOUNTER — Non-Acute Institutional Stay: Payer: PPO | Admitting: Internal Medicine

## 2022-03-29 ENCOUNTER — Encounter: Payer: Self-pay | Admitting: Internal Medicine

## 2022-03-29 VITALS — BP 136/70 | HR 80 | Temp 98.4°F | Resp 17 | Ht 62.0 in | Wt 127.0 lb

## 2022-03-29 DIAGNOSIS — R61 Generalized hyperhidrosis: Secondary | ICD-10-CM | POA: Diagnosis not present

## 2022-03-29 DIAGNOSIS — R053 Chronic cough: Secondary | ICD-10-CM

## 2022-03-29 DIAGNOSIS — R42 Dizziness and giddiness: Secondary | ICD-10-CM | POA: Diagnosis not present

## 2022-03-29 DIAGNOSIS — I10 Essential (primary) hypertension: Secondary | ICD-10-CM | POA: Diagnosis not present

## 2022-03-29 DIAGNOSIS — D0512 Intraductal carcinoma in situ of left breast: Secondary | ICD-10-CM | POA: Diagnosis not present

## 2022-03-29 DIAGNOSIS — E2839 Other primary ovarian failure: Secondary | ICD-10-CM | POA: Diagnosis not present

## 2022-03-29 NOTE — Progress Notes (Signed)
Location: [facility] of Service:  Clinic (12)  Provider:   Code Status:  Goals of Care:     03/29/2022    9:12 AM  Advanced Directives  Does Patient Have a Medical Advance Directive? Yes  Type of Paramedic of [place];Living will  Copy of [place] in Chart? Yes - validated most recent copy scanned in chart (See row information)     Chief Complaint  Patient presents with   Medical Management of Chronic Issues    1 week follow up   Immunizations    Discussed the need for TD, Pneumonia and covid Vaccine    HPI: Patient is a 88 y.o. female seen today for an acute visit for BP follow up    Lives in [place] in [place]   Patient has a history of hypertension  Was started on Norvasc due to High BP Says since then she feels funny in her head Unstable and Dizzy like she is going to fall  I discontinued her Norvasc and he BP has been good recently with just one or two readings of SBP more then 160 Feels less dizzy but still feels something is not right in her head Is going to see ENT in few days for her inner ear wax and this issues Also Continues to c/o Coughing and sneezing Nothing seems to help Has follow up with Pulmonary  Also has Night sweats  Past Surgical History:  Procedure Laterality Date   BREAST LUMPECTOMY WITH RADIOACTIVE SEED LOCALIZATION Left 12/07/2021   Procedure: LEFT BREAST LUMPECTOMY WITH RADIOACTIVE SEED LOCALIZATION;  Surgeon: Jovita Kussmaul, MD;  Location: [facility];  Service: General;  Laterality: Left;   CHOLECYSTECTOMY  04/25/1972   Gall Bladder; [facility]   DG PORTABLE CHEST X RAY ([facility]X)  2023   Shoulder   DIAGNOSTIC MAMMOGRAM  2022   [facility]; Per new patient packet   JOINT REPLACEMENT Left 08/24/2010   JOINT REPLACEMENT Left 04/25/2010   Reverse    orthroscopic  Rotator Cuff Left 04/25/2009   Partial shoulder Left 04/26/2003   ROTATOR CUFF REPAIR     x 2  1998 and 1999   SPINE SURGERY  04/26/1983   disk   TONSILLECTOMY  04/25/1950   Dr. Nyoka Cowden; Per new patient packet    Allergies  Allergen Reactions   Sulfa Antibiotics     Outpatient Encounter Medications as of 03/29/2022  Medication Sig   acetaminophen (TYLENOL) 500 MG tablet Take 500 mg by mouth as needed.   ALBUTEROL SULFATE IN Inhale 1 puff into the lungs daily as needed.   atenolol (TENORMIN) 25 MG tablet Take 50 mg by mouth daily.   azelastine (ASTELIN) 0.1 % nasal spray Place 2 sprays into both nostrils 2 (two) times daily. Use in each nostril as directed   Cholecalciferol (D3 PO) Take 250 mcg by mouth daily.   Cyanocobalamin (B-12) 1000 MCG TABS Take by mouth daily.   dexlansoprazole (DEXILANT) 60 MG capsule Take 60 mg by mouth daily.   diclofenac Sodium (VOLTAREN) 1 % GEL Apply topically as needed.   fluticasone (FLONASE ALLERGY RELIEF) 50 MCG/ACT nasal spray Use 1 spray each nostril twice a day for 14 days then use 1 spray each nostril once daily thereafter   hydrocortisone 1 % ointment Apply 1 Application topically 2 (two) times daily. (Patient taking differently: Apply 1 Application topically 2 (two) times daily as needed for itching.)   letrozole (  FEMARA) 2.5 MG tablet Take 1 tablet (2.5 mg total) by mouth daily.   lidocaine (SALONPAS PAIN RELIEVING) 4 % Place onto the skin as needed.   losartan (COZAAR) 50 MG tablet Take 1 tablet (50 mg total) by mouth 2 (two) times daily.   mirtazapine (REMERON) 15 MG tablet Take 0.5 tablets (7.5 mg total) by mouth at bedtime as needed for sleep.   montelukast (SINGULAIR) 10 MG tablet Take 1 tablet (10 mg total) by mouth at bedtime.   [DISCONTINUED] Loperamide-Simethicone 2-125 MG TABS Take 1 tablet by mouth as needed.   No facility-administered encounter medications on file as of 03/29/2022.    Review of Systems:  Review of Systems  Constitutional:  Negative for activity change and appetite change.  HENT:  Positive for rhinorrhea.    Respiratory:  Positive for cough. Negative for shortness of breath.   Cardiovascular:  Negative for leg swelling.  Gastrointestinal:  Negative for constipation.  Genitourinary: Negative.   Musculoskeletal:  Negative for arthralgias, gait problem and myalgias.  Skin: Negative.   Neurological:  Positive for dizziness. Negative for weakness.  Psychiatric/Behavioral:  Negative for confusion, dysphoric mood and sleep disturbance.     Health Maintenance  Topic Date Due   DTaP/Tdap/Td (1 - Tdap) Never done   Pneumonia Vaccine 19+ Years old (2 - PCV) 11/10/2021   COVID-19 Vaccine (5 - 2023-24 season) 02/05/2022   Medicare Annual Wellness (AWV)  09/03/2022   INFLUENZA VACCINE  Completed   DEXA SCAN  Completed   Zoster Vaccines- Shingrix  Completed   HPV VACCINES  Aged Out    Physical Exam: Vitals:   03/29/22 0914  BP: 136/70  Pulse: 80  Resp: 17  Temp: 98.4 F (36.9 C)  TempSrc: Temporal  SpO2: 98%  Weight: 127 lb (57.6 kg)  Height: '5\' 2"'$  (1.575 m)   Body mass index is 23.23 kg/m. Physical Exam Vitals reviewed.  Constitutional:      Appearance: Normal appearance.  HENT:     Head: Normocephalic.     Nose: Nose normal.     Mouth/Throat:     Mouth: Mucous membranes are moist.     Pharynx: Oropharynx is clear.  Eyes:     Pupils: Pupils are equal, round, and reactive to light.  Cardiovascular:     Rate and Rhythm: Normal rate and regular rhythm.     Pulses: Normal pulses.     Heart sounds: Normal heart sounds. No murmur heard. Pulmonary:     Effort: Pulmonary effort is normal.     Breath sounds: Normal breath sounds.  Abdominal:     General: Abdomen is flat. Bowel sounds are normal.     Palpations: Abdomen is soft.  Musculoskeletal:        General: No swelling.     Cervical back: Neck supple.  Skin:    General: Skin is warm.  Neurological:     General: No focal deficit present.     Mental Status: She is alert and oriented to person, place, and time.   Psychiatric:        Mood and Affect: Mood normal.        Thought Content: Thought content normal.     Labs reviewed: Basic Metabolic Panel: Recent Labs    11/18/21 0000 11/24/21 0818 02/03/22 0205  NA 134* 133* 130*  K 4.5 4.3 4.2  CL 97* 99 95*  CO2 24* 26 27  GLUCOSE  --  90 104*  BUN '13 14 8  '$ CREATININE  0.6 0.68 0.60  CALCIUM 9.1 9.1 9.7  TSH 1.41  --   --    Liver Function Tests: Recent Labs    11/18/21 0000 11/24/21 0818  AST 14 12*  ALT 18 11  ALKPHOS 67 54  BILITOT  --  0.5  PROT  --  6.9  ALBUMIN  --  4.4   No results for input(s): "LIPASE", "AMYLASE" in the last 8760 hours. No results for input(s): "AMMONIA" in the last 8760 hours. CBC: Recent Labs    11/18/21 0000 11/24/21 0818 02/03/22 0205  WBC 7.5 6.4 8.4  NEUTROABS  --  4.4 4.9  HGB 13.7 13.7 14.4  HCT  --  39.3 41.9  MCV  --  82.2 81.2  PLT 328 300 331   Lipid Panel: Recent Labs    11/18/21 0000  CHOL 152  HDL 55  LDLCALC 70  TRIG 136   No results found for: "HGBA1C"  Procedures since last visit: No results found.  Assessment/Plan 1. Primary hypertension BP good off Norvasc.  Tenormin and Cozaar Keep home monitoring Loose control due to her Dizziness  2. Dizziness Somewhat better off Norvasc Will See ENT If continues to have symptoms will Referral to Neurology Has Seen Dr Krista Blue Before CT scan in 2022 was negative for acute disease Encourage PO Fluids 3. Chronic cough Takes Hydrocodone syrup PRN Also on Aztelin and Singulair Also on DExilant 4. 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 Night sweats No New meds right now due ot her Dizziness  6. Estrogen deficiency DEXA ordered    Labs/tests ordered:  * No order type specified * Next appt:  06/21/2022

## 2022-03-31 ENCOUNTER — Other Ambulatory Visit (HOSPITAL_BASED_OUTPATIENT_CLINIC_OR_DEPARTMENT_OTHER): Payer: Self-pay

## 2022-04-01 ENCOUNTER — Other Ambulatory Visit (HOSPITAL_BASED_OUTPATIENT_CLINIC_OR_DEPARTMENT_OTHER): Payer: Self-pay

## 2022-04-01 MED ORDER — PREVNAR 20 0.5 ML IM SUSY
PREFILLED_SYRINGE | INTRAMUSCULAR | 0 refills | Status: DC
  Filled 2022-04-01: qty 0.5, 1d supply, fill #0

## 2022-04-04 DIAGNOSIS — H6123 Impacted cerumen, bilateral: Secondary | ICD-10-CM | POA: Diagnosis not present

## 2022-04-05 ENCOUNTER — Non-Acute Institutional Stay: Payer: PPO | Admitting: Internal Medicine

## 2022-04-05 ENCOUNTER — Other Ambulatory Visit (HOSPITAL_BASED_OUTPATIENT_CLINIC_OR_DEPARTMENT_OTHER): Payer: Self-pay

## 2022-04-05 ENCOUNTER — Encounter: Payer: PPO | Admitting: Internal Medicine

## 2022-04-05 ENCOUNTER — Encounter: Payer: Self-pay | Admitting: Internal Medicine

## 2022-04-05 VITALS — BP 142/72 | HR 71 | Temp 98.1°F | Resp 17 | Ht 62.0 in | Wt 127.0 lb

## 2022-04-05 DIAGNOSIS — R42 Dizziness and giddiness: Secondary | ICD-10-CM | POA: Diagnosis not present

## 2022-04-05 DIAGNOSIS — I10 Essential (primary) hypertension: Secondary | ICD-10-CM

## 2022-04-05 MED ORDER — HYDRALAZINE HCL 10 MG PO TABS
10.0000 mg | ORAL_TABLET | Freq: Every day | ORAL | 0 refills | Status: DC | PRN
  Filled 2022-04-05: qty 30, 30d supply, fill #0

## 2022-04-05 NOTE — Progress Notes (Unsigned)
Location: [facility] of Service:  Clinic (12)  Provider:   Code Status: *** Goals of Care:     04/05/2022    2:15 PM  Advanced Directives  Does Patient Have a Medical Advance Directive? Yes  Type of Paramedic of [place];Living will  Copy of [place] in Chart? Yes - validated most recent copy scanned in chart (See row information)     Chief Complaint  Patient presents with   Acute Visit    Patient states she is still having issues with BP    HPI: Patient is a 88 y.o. female seen today for an acute visit for  Past Medical History:  Diagnosis Date   Breast cancer ([place]) 10/2021   left breast DCIS   Diverticulosis    Esophageal reflux    Family history of breast cancer 11/25/2021   Hiatal hernia    Hx of colonic polyps    Hypertension    Per new patient packet   Mild asthma    Per new patient packet   Osteoarthritis    Osteopenia    bone density 1-09 and 2-13   Pulmonary nodules    scattered-CT Scan July 2010, 12-10, and 04-2010-all stable felt benign     Past Surgical History:  Procedure Laterality Date   BREAST LUMPECTOMY WITH RADIOACTIVE SEED LOCALIZATION Left 12/07/2021   Procedure: LEFT BREAST LUMPECTOMY WITH RADIOACTIVE SEED LOCALIZATION;  Surgeon: Jovita Kussmaul, MD;  Location: [facility];  Service: General;  Laterality: Left;   CHOLECYSTECTOMY  04/25/1972   Gall Bladder; [facility]   DG PORTABLE CHEST X RAY ([facility] HX)  2023   Shoulder   DIAGNOSTIC MAMMOGRAM  2022   [facility]; Per new patient packet   JOINT REPLACEMENT Left 08/24/2010   JOINT REPLACEMENT Left 04/25/2010   Reverse    orthroscopic  Rotator Cuff Left 04/25/2009   Partial shoulder Left 04/26/2003   ROTATOR CUFF REPAIR     x 2 1998 and 1999   SPINE SURGERY  04/26/1983   disk   TONSILLECTOMY  04/25/1950   Dr. Nyoka Cowden; Per new patient packet    Allergies  Allergen Reactions   Sulfa Antibiotics      Outpatient Encounter Medications as of 04/05/2022  Medication Sig   acetaminophen (TYLENOL) 500 MG tablet Take 500 mg by mouth as needed.   ALBUTEROL SULFATE IN Inhale 1 puff into the lungs daily as needed.   atenolol (TENORMIN) 25 MG tablet Take 50 mg by mouth daily.   azelastine (ASTELIN) 0.1 % nasal spray Place 2 sprays into both nostrils 2 (two) times daily. Use in each nostril as directed   Cholecalciferol (D3 PO) Take 250 mcg by mouth daily.   Cyanocobalamin (B-12) 1000 MCG TABS Take by mouth daily.   dexlansoprazole (DEXILANT) 60 MG capsule Take 60 mg by mouth daily.   diclofenac Sodium (VOLTAREN) 1 % GEL Apply topically as needed.   fluticasone (FLONASE ALLERGY RELIEF) 50 MCG/ACT nasal spray Use 1 spray each nostril twice a day for 14 days then use 1 spray each nostril once daily thereafter   hydrALAZINE (APRESOLINE) 10 MG tablet Take 1 tablet (10 mg total) by mouth daily as needed.   hydrocortisone 1 % ointment Apply 1 Application topically 2 (two) times daily. (Patient taking differently: Apply 1 Application topically 2 (two) times daily as needed for itching.)   letrozole (FEMARA) 2.5 MG tablet Take 1 tablet (2.5 mg total) by  mouth daily.   lidocaine (SALONPAS PAIN RELIEVING) 4 % Place onto the skin as needed.   losartan (COZAAR) 50 MG tablet Take 1 tablet (50 mg total) by mouth 2 (two) times daily.   mirtazapine (REMERON) 15 MG tablet Take 0.5 tablets (7.5 mg total) by mouth at bedtime as needed for sleep.   montelukast (SINGULAIR) 10 MG tablet Take 1 tablet (10 mg total) by mouth at bedtime.   pneumococcal 20-valent conjugate vaccine (PREVNAR 20) 0.5 ML injection Inject into the muscle.   No facility-administered encounter medications on file as of 04/05/2022.    Review of Systems:  Review of Systems  Health Maintenance  Topic Date Due   DTaP/Tdap/Td (1 - Tdap) Never done   COVID-19 Vaccine (5 - 2023-24 season) 02/05/2022   Medicare Annual Wellness (AWV)  09/03/2022    Pneumonia Vaccine 75+ Years old  Completed   INFLUENZA VACCINE  Completed   DEXA SCAN  Completed   Zoster Vaccines- Shingrix  Completed   HPV VACCINES  Aged Out    Physical Exam: Vitals:   04/05/22 1410  BP: (!) 142/72  Pulse: 71  Resp: 17  Temp: 98.1 F (36.7 C)  TempSrc: Temporal  SpO2: 96%  Weight: 127 lb (57.6 kg)  Height: '5\' 2"'$  (1.575 m)   Body mass index is 23.23 kg/m. Physical Exam  Labs reviewed: Basic Metabolic Panel: Recent Labs    11/18/21 0000 11/24/21 0818 02/03/22 0205  NA 134* 133* 130*  K 4.5 4.3 4.2  CL 97* 99 95*  CO2 24* 26 27  GLUCOSE  --  90 104*  BUN '13 14 8  '$ CREATININE 0.6 0.68 0.60  CALCIUM 9.1 9.1 9.7  TSH 1.41  --   --    Liver Function Tests: Recent Labs    11/18/21 0000 11/24/21 0818  AST 14 12*  ALT 18 11  ALKPHOS 67 54  BILITOT  --  0.5  PROT  --  6.9  ALBUMIN  --  4.4   No results for input(s): "LIPASE", "AMYLASE" in the last 8760 hours. No results for input(s): "AMMONIA" in the last 8760 hours. CBC: Recent Labs    11/18/21 0000 11/24/21 0818 02/03/22 0205  WBC 7.5 6.4 8.4  NEUTROABS  --  4.4 4.9  HGB 13.7 13.7 14.4  HCT  --  39.3 41.9  MCV  --  82.2 81.2  PLT 328 300 331   Lipid Panel: Recent Labs    11/18/21 0000  CHOL 152  HDL 55  LDLCALC 70  TRIG 136   No results found for: "HGBA1C"  Procedures since last visit: No results found.  Assessment/Plan 1. Primary hypertension ***  2. Dizziness *** - Ambulatory referral to Neurology    Labs/tests ordered:  * No order type specified * Next appt:  05/03/2022

## 2022-04-05 NOTE — Patient Instructions (Signed)
Only take Hydralazine if your BP goes more then 180. Check it twice before taking the medication Get BP check with Heather once a week for 4 weeks

## 2022-04-06 ENCOUNTER — Other Ambulatory Visit (HOSPITAL_BASED_OUTPATIENT_CLINIC_OR_DEPARTMENT_OTHER): Payer: Self-pay

## 2022-04-08 DIAGNOSIS — R195 Other fecal abnormalities: Secondary | ICD-10-CM | POA: Diagnosis not present

## 2022-04-11 ENCOUNTER — Encounter: Payer: Self-pay | Admitting: Internal Medicine

## 2022-04-12 ENCOUNTER — Encounter: Payer: PPO | Admitting: Internal Medicine

## 2022-04-12 ENCOUNTER — Other Ambulatory Visit (HOSPITAL_BASED_OUTPATIENT_CLINIC_OR_DEPARTMENT_OTHER): Payer: Self-pay

## 2022-04-12 MED ORDER — CHOLESTYRAMINE 4 G PO PACK
PACK | ORAL | 0 refills | Status: DC
Start: 2022-04-12 — End: 2022-08-22
  Filled 2022-04-12: qty 90, 90d supply, fill #0

## 2022-04-13 ENCOUNTER — Other Ambulatory Visit (HOSPITAL_BASED_OUTPATIENT_CLINIC_OR_DEPARTMENT_OTHER): Payer: Self-pay

## 2022-04-14 ENCOUNTER — Other Ambulatory Visit (HOSPITAL_BASED_OUTPATIENT_CLINIC_OR_DEPARTMENT_OTHER): Payer: Self-pay

## 2022-04-14 MED ORDER — AREXVY 120 MCG/0.5ML IM SUSR
INTRAMUSCULAR | 0 refills | Status: DC
  Filled 2022-04-14: qty 0.3, 1d supply, fill #0

## 2022-04-20 ENCOUNTER — Encounter: Payer: Self-pay | Admitting: Internal Medicine

## 2022-04-26 DIAGNOSIS — M8589 Other specified disorders of bone density and structure, multiple sites: Secondary | ICD-10-CM | POA: Diagnosis not present

## 2022-04-26 DIAGNOSIS — Z780 Asymptomatic menopausal state: Secondary | ICD-10-CM | POA: Diagnosis not present

## 2022-04-26 LAB — HM DEXA SCAN

## 2022-04-29 ENCOUNTER — Other Ambulatory Visit: Payer: Self-pay | Admitting: Pulmonary Disease

## 2022-04-29 ENCOUNTER — Other Ambulatory Visit: Payer: Self-pay | Admitting: Oncology

## 2022-04-29 ENCOUNTER — Other Ambulatory Visit: Payer: Self-pay | Admitting: Internal Medicine

## 2022-04-29 DIAGNOSIS — I10 Essential (primary) hypertension: Secondary | ICD-10-CM

## 2022-04-29 DIAGNOSIS — R053 Chronic cough: Secondary | ICD-10-CM

## 2022-04-29 NOTE — Telephone Encounter (Signed)
Patient has request refill on medication Atenolol '25mg'$ . Patient medication was refilled elsewhere. Medication pend and sent to PCP Virgie Dad, MD for approval.

## 2022-05-02 ENCOUNTER — Other Ambulatory Visit (HOSPITAL_BASED_OUTPATIENT_CLINIC_OR_DEPARTMENT_OTHER): Payer: Self-pay

## 2022-05-02 MED FILL — Letrozole Tab 2.5 MG: ORAL | 30 days supply | Qty: 30 | Fill #0 | Status: AC

## 2022-05-02 MED FILL — Atenolol Tab 25 MG: ORAL | 30 days supply | Qty: 30 | Fill #0 | Status: AC

## 2022-05-02 MED FILL — Montelukast Sodium Tab 10 MG (Base Equiv): ORAL | 30 days supply | Qty: 30 | Fill #0 | Status: AC

## 2022-05-03 ENCOUNTER — Non-Acute Institutional Stay: Payer: PPO | Admitting: Internal Medicine

## 2022-05-03 ENCOUNTER — Encounter: Payer: Self-pay | Admitting: Internal Medicine

## 2022-05-03 ENCOUNTER — Other Ambulatory Visit (HOSPITAL_BASED_OUTPATIENT_CLINIC_OR_DEPARTMENT_OTHER): Payer: Self-pay

## 2022-05-03 VITALS — BP 164/86 | HR 72 | Temp 98.1°F | Resp 18 | Ht 62.0 in | Wt 126.4 lb

## 2022-05-03 DIAGNOSIS — I10 Essential (primary) hypertension: Secondary | ICD-10-CM | POA: Diagnosis not present

## 2022-05-03 DIAGNOSIS — R42 Dizziness and giddiness: Secondary | ICD-10-CM | POA: Diagnosis not present

## 2022-05-03 MED ORDER — ATENOLOL 25 MG PO TABS
25.0000 mg | ORAL_TABLET | Freq: Two times a day (BID) | ORAL | 3 refills | Status: DC
  Filled 2022-05-03 – 2022-06-07 (×2): qty 90, 45d supply, fill #0

## 2022-05-03 MED ORDER — LOSARTAN POTASSIUM 50 MG PO TABS
50.0000 mg | ORAL_TABLET | Freq: Two times a day (BID) | ORAL | 3 refills | Status: DC
  Filled 2022-05-03: qty 60, 30d supply, fill #0
  Filled 2022-05-17 – 2022-06-07 (×2): qty 60, 30d supply, fill #1
  Filled 2022-07-04: qty 60, 30d supply, fill #2
  Filled 2022-08-04: qty 60, 30d supply, fill #3

## 2022-05-03 MED ORDER — HYDRALAZINE HCL 10 MG PO TABS
10.0000 mg | ORAL_TABLET | Freq: Three times a day (TID) | ORAL | 1 refills | Status: DC
  Filled 2022-05-03: qty 90, 30d supply, fill #0
  Filled 2022-05-17: qty 90, 30d supply, fill #1

## 2022-05-04 DIAGNOSIS — H903 Sensorineural hearing loss, bilateral: Secondary | ICD-10-CM | POA: Diagnosis not present

## 2022-05-08 NOTE — Progress Notes (Signed)
Location: [facility] of Service:  Clinic (12)  Provider:   Code Status:  Goals of Care:     05/03/2022    3:02 PM  Advanced Directives  Does Patient Have a Medical Advance Directive? Yes  Type of Paramedic of [place];Living will  Does patient want to make changes to medical advance directive? No - Patient declined  Copy of [place] in Chart? Yes - validated most recent copy scanned in chart (See row information)     Chief Complaint  Patient presents with   Medical Management of Chronic Issues    Follow up   Quality Metric Gaps    Discussed need for Tetanus and covid vaccine    HPI: Patient is a 89 y.o. female seen today for an acute visit for Dizziness BP control  [place]ives in [place] in [place]   Patient has a history of hypertension  Was started on Norvasc due to High BP Says since then she feels funny in her head Unstable and Dizzy like she is going to fall   I discontinued her Norvasc  So started on Low Dose of Hydralazine BP slightly better but keeps complaining of Dizziness. Not orthostatic Just says her Head feels different Denies Vertigo She says she feels her Gait is different when she is dizzy Does have appointment with Neurology   Past Medical History:  Diagnosis Date   Breast cancer ([place]) 10/2021   left breast DCIS   Diverticulosis    Esophageal reflux    Family history of breast cancer 11/25/2021   Hiatal hernia    Hx of colonic polyps    Hypertension    Per new patient packet   Mild asthma    Per new patient packet   Osteoarthritis    Osteopenia    bone density 1-09 and 2-13   Pulmonary nodules    scattered-CT Scan July 2010, 12-10, and 04-2010-all stable felt benign     Past Surgical History:  Procedure Laterality Date   BREAST LUMPECTOMY WITH RADIOACTIVE SEED LOCALIZATION Left 12/07/2021   Procedure: LEFT BREAST LUMPECTOMY WITH RADIOACTIVE SEED LOCALIZATION;  Surgeon: Jovita Kussmaul, MD;  Location: [facility];  Service: General;  Laterality: Left;   CHOLECYSTECTOMY  04/25/1972   Gall Bladder; [facility]   DG PORTABLE CHEST X RAY ([facility] HX)  2023   Shoulder   DIAGNOSTIC MAMMOGRAM  2022   [facility]; Per new patient packet   JOINT REPLACEMENT Left 08/24/2010   JOINT REPLACEMENT Left 04/25/2010   Reverse    orthroscopic  Rotator Cuff Left 04/25/2009   Partial shoulder Left 04/26/2003   ROTATOR CUFF REPAIR     x 2 1998 and 1999   SPINE SURGERY  04/26/1983   disk   TONSILLECTOMY  04/25/1950   Dr. Nyoka Cowden; Per new patient packet    Allergies  Allergen Reactions   Sulfa Antibiotics     Outpatient Encounter Medications as of 05/03/2022  Medication Sig   acetaminophen (TYLENOL) 500 MG tablet Take 500 mg by mouth as needed.   atenolol (TENORMIN) 25 MG tablet Take 1 tablet (25 mg total) by mouth 2 (two) times daily.   azelastine (ASTELIN) 0.1 % nasal spray Place 2 sprays into both nostrils 2 (two) times daily. Use in each nostril as directed   Cholecalciferol (D3 PO) Take 250 mcg by mouth daily.   cholestyramine (QUESTRAN) 4 g packet Take 1 packet mixed with water or non-carbonated  drink by mouth once daily.   Cyanocobalamin (B-12) 1000 MCG TABS Take by mouth daily.   dexlansoprazole (DEXILANT) 60 MG capsule Take 60 mg by mouth daily.   diclofenac Sodium (VOLTAREN) 1 % GEL Apply topically as needed.   fluticasone (FLONASE ALLERGY RELIEF) 50 MCG/ACT nasal spray Use 1 spray each nostril twice a day for 14 days then use 1 spray each nostril once daily thereafter   hydrALAZINE (APRESOLINE) 10 MG tablet Take 1 tablet (10 mg total) by mouth 3 (three) times daily.   hydrocortisone 1 % ointment Apply 1 Application topically 2 (two) times daily. (Patient taking differently: Apply 1 Application topically 2 (two) times daily as needed for itching.)   letrozole (FEMARA) 2.5 MG tablet TAKE ONE TABLET BY MOUTH ONCE DAILY   lidocaine (SALONPAS PAIN RELIEVING) 4 %  Place onto the skin as needed.   mirtazapine (REMERON) 15 MG tablet Take 0.5 tablets (7.5 mg total) by mouth at bedtime as needed for sleep.   montelukast (SINGULAIR) 10 MG tablet TAKE ONE TABLET BY MOUTH EVERYDAY AT BEDTIME   omeprazole (PRILOSEC) 20 MG capsule Take 1 capsule by mouth daily.   pneumococcal 20-valent conjugate vaccine (PREVNAR 20) 0.5 ML injection Inject into the muscle.   RSV vaccine recomb adjuvanted (AREXVY) 120 MCG/0.5ML injection Inject into the muscle.   [DISCONTINUED] ALBUTEROL SULFATE IN Inhale 1 puff into the lungs daily as needed.   [DISCONTINUED] atenolol (TENORMIN) 25 MG tablet TAKE ONE TABLET BY MOUTH DAILY (Patient taking differently: Take 25 mg by mouth 2 (two) times daily.)   [DISCONTINUED] hydrALAZINE (APRESOLINE) 10 MG tablet Take 1 tablet (10 mg total) by mouth daily as needed. (Patient taking differently: Take 10 mg by mouth 3 (three) times daily.)   [DISCONTINUED] losartan (COZAAR) 50 MG tablet Take 1 tablet (50 mg total) by mouth 2 (two) times daily.   losartan (COZAAR) 50 MG tablet Take 1 tablet (50 mg total) by mouth 2 (two) times daily.   No facility-administered encounter medications on file as of 05/03/2022.    Review of Systems:  Review of Systems  Constitutional:  Negative for activity change and appetite change.  HENT: Negative.    Respiratory:  Negative for cough and shortness of breath.   Cardiovascular:  Negative for leg swelling.  Gastrointestinal:  Negative for constipation.  Genitourinary: Negative.   Musculoskeletal:  Negative for arthralgias, gait problem and myalgias.  Skin: Negative.   Neurological:  Positive for dizziness. Negative for weakness.  Psychiatric/Behavioral:  Negative for confusion, dysphoric mood and sleep disturbance.     Health Maintenance  Topic Date Due   DTaP/Tdap/Td (1 - Tdap) Never done   COVID-19 Vaccine (5 - 2023-24 season) 03/24/2022   Medicare Annual Wellness (AWV)  09/03/2022   Pneumonia Vaccine 64+  Years old  Completed   INFLUENZA VACCINE  Completed   DEXA SCAN  Completed   Zoster Vaccines- Shingrix  Completed   HPV VACCINES  Aged Out    Physical Exam: Vitals:   05/03/22 1456  BP: (!) 164/86  Pulse: 72  Resp: 18  Temp: 98.1 F (36.7 C)  TempSrc: Temporal  SpO2: 96%  Weight: 126 lb 6.4 oz (57.3 kg)  Height: '5\' 2"'$  (1.575 m)   Body mass index is 23.12 kg/m. Physical Exam Vitals reviewed.  Constitutional:      Appearance: Normal appearance.  HENT:     Head: Normocephalic.     Nose: Nose normal.     Mouth/Throat:     Mouth: Mucous  membranes are moist.     Pharynx: Oropharynx is clear.  Eyes:     Pupils: Pupils are equal, round, and reactive to light.  Cardiovascular:     Rate and Rhythm: Normal rate and regular rhythm.     Pulses: Normal pulses.     Heart sounds: Normal heart sounds. No murmur heard. Pulmonary:     Effort: Pulmonary effort is normal.     Breath sounds: Normal breath sounds.  Abdominal:     General: Abdomen is flat. Bowel sounds are normal.     Palpations: Abdomen is soft.  Musculoskeletal:        General: No swelling.     Cervical back: Neck supple.  Skin:    General: Skin is warm.  Neurological:     General: No focal deficit present.     Mental Status: She is alert and oriented to person, place, and time.  Psychiatric:        Mood and Affect: Mood normal.        Thought Content: Thought content normal.     Labs reviewed: Basic Metabolic Panel: Recent Labs    11/18/21 0000 11/24/21 0818 02/03/22 0205  NA 134* 133* 130*  K 4.5 4.3 4.2  CL 97* 99 95*  CO2 24* 26 27  GLUCOSE  --  90 104*  BUN '13 14 8  '$ CREATININE 0.6 0.68 0.60  CALCIUM 9.1 9.1 9.7  TSH 1.41  --   --    Liver Function Tests: Recent Labs    11/18/21 0000 11/24/21 0818  AST 14 12*  ALT 18 11  ALKPHOS 67 54  BILITOT  --  0.5  PROT  --  6.9  ALBUMIN  --  4.4   No results for input(s): "LIPASE", "AMYLASE" in the last 8760 hours. No results for input(s):  "AMMONIA" in the last 8760 hours. CBC: Recent Labs    11/18/21 0000 11/24/21 0818 02/03/22 0205  WBC 7.5 6.4 8.4  NEUTROABS  --  4.4 4.9  HGB 13.7 13.7 14.4  HCT  --  39.3 41.9  MCV  --  82.2 81.2  PLT 328 300 331   Lipid Panel: Recent Labs    11/18/21 0000  CHOL 152  HDL 55  LDLCALC 70  TRIG 136   No results found for: "HGBA1C"  Procedures since last visit: No results found.  Assessment/Plan 1. Dizziness Also Has Appointment with Neurology Ear Checked BY ENT Continue to keep yourself Hydrated  - MR Brain W Wo Contrast; Future  2. 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

## 2022-05-11 ENCOUNTER — Other Ambulatory Visit: Payer: Self-pay

## 2022-05-11 ENCOUNTER — Ambulatory Visit: Payer: PPO | Admitting: Pulmonary Disease

## 2022-05-11 ENCOUNTER — Other Ambulatory Visit (HOSPITAL_BASED_OUTPATIENT_CLINIC_OR_DEPARTMENT_OTHER): Payer: Self-pay

## 2022-05-11 ENCOUNTER — Encounter: Payer: Self-pay | Admitting: Pulmonary Disease

## 2022-05-11 VITALS — BP 134/70 | HR 66 | Temp 98.7°F | Wt 124.6 lb

## 2022-05-11 DIAGNOSIS — R053 Chronic cough: Secondary | ICD-10-CM

## 2022-05-11 MED ORDER — FLUTICASONE-SALMETEROL 500-50 MCG/ACT IN AEPB
1.0000 | INHALATION_SPRAY | Freq: Two times a day (BID) | RESPIRATORY_TRACT | 2 refills | Status: DC
  Filled 2022-05-11: qty 60, 30d supply, fill #0

## 2022-05-11 MED ORDER — HYDROCODONE BIT-HOMATROP MBR 5-1.5 MG/5ML PO SOLN
5.0000 mL | Freq: Every evening | ORAL | 0 refills | Status: DC | PRN
  Filled 2022-05-11: qty 240, 48d supply, fill #0

## 2022-05-11 NOTE — Patient Instructions (Addendum)
Nice to see you again  Lets try an inhaler called Advair discus, 1 puff twice a day every day.  Rinse your mouth out and spit with water after every use.  Will be ran the test claim before the new year, but co-pay was estimated at $12.  If the cost of the medication is too much which is possible with the start of the new year, please let me know and we will look for more cost effective alternative.  I sent a prescription for the hydrocodone cough syrup.  I request to use this sparingly, I recommend you do not drive after taking this medication.  Return to clinic in 2 months or sooner as needed with Dr. Silas Flood

## 2022-05-12 ENCOUNTER — Other Ambulatory Visit (HOSPITAL_BASED_OUTPATIENT_CLINIC_OR_DEPARTMENT_OTHER): Payer: Self-pay

## 2022-05-12 NOTE — Progress Notes (Signed)
$'@Patient'p$  ID: Tammy Lindsey, female    DOB: [birth year], 89 y.o.   MRN: [id number]  Chief Complaint  Patient presents with   Follow-up    Follow up for cough. Pt states that the cough is terrible. Pt states she has not seen any change in her cough. Pt states she is not doing the nasal sprays because she has had issues with getting the medication to come out the bottles. And she is also on singular.     Referring provider: Virgie Dad, MD  HPI:   71 y.o. woman whom are seen in follow up for evaluation of chronic cough.  Most recent PCP note x 4 reviewed. Most recent oncology note reviewed.  At last visit, cough send like postnasal drip.  We tried nasal sprays but she did not think they were.  She had a hard time actually using them.  Cough not really improved.  I advised ICS/LABA inhaler.  This message in the past on her.  She is been taking hydrocodone cough syrup prescribed by her PCP.  She asked me for some.  I counseled her extensively on okay with this to be used sparingly, mainly at night.  Advised her she cannot drive after taking this medication.  She states she was unaware of this.  She has been driving while taking it.  I stressed the she should not drive after taking it.  HPI at initial visit: Patient reports cough for several months now.  Maybe that worse in the mornings.  No position make things better or worse.  No seasonal environmental factors she can identify that make things better or worse.  She used albuterol some.  Did not seem to help much, in fact made cough worse after using.  She complains of some sinus and nasal congestion.  Comes and goes in terms severity.  Feels like mucus dripping in the back of her throat.  Then it accumulates and she coughs it out.  She uses Flonase once daily.  Chest imaging 04/2020 personally reviewed interpreted as flattened diaphragms needed with mild hyperinflation, otherwise clear.  PMH: Seasonal allergies, hypertension Surgical history:  Breast lumpectomy, cholecystectomy, joint replacement, spine surgery Family history: Mother with cervical cancer disease, father cerebral hemorrhage Social history: Never smoker, lives in [place] / Pulmonary Flowsheets:   ACT:      No data to display           MMRC:     No data to display           Epworth:      No data to display           Tests:   FENO:  No results found for: "NITRICOXIDE"  PFT:     No data to display           WALK:      No data to display           Imaging: Personally reviewed and as per EMR discussion this note No results found.  Lab Results: Personally reviewed CBC    Component Value Date/Time   WBC 8.4 02/03/2022 0205   RBC 5.16 (H) 02/03/2022 0205   HGB 14.4 02/03/2022 0205   HGB 13.7 11/24/2021 0818   HCT 41.9 02/03/2022 0205   PLT 331 02/03/2022 0205   PLT 300 11/24/2021 0818   MCV 81.2 02/03/2022 0205   MCH 27.9 02/03/2022 0205   MCHC 34.4 02/03/2022 0205   RDW 12.5 02/03/2022  0205   LYMPHSABS 2.4 02/03/2022 0205   MONOABS 0.7 02/03/2022 0205   EOSABS 0.2 02/03/2022 0205   BASOSABS 0.1 02/03/2022 0205    BMET    Component Value Date/Time   NA 130 (L) 02/03/2022 0205   NA 134 (A) 11/18/2021 0000   K 4.2 02/03/2022 0205   CL 95 (L) 02/03/2022 0205   CO2 27 02/03/2022 0205   GLUCOSE 104 (H) 02/03/2022 0205   BUN 8 02/03/2022 0205   BUN 13 11/18/2021 0000   CREATININE 0.60 02/03/2022 0205   CREATININE 0.68 11/24/2021 0818   CALCIUM 9.7 02/03/2022 0205   GFRNONAA >60 02/03/2022 0205   GFRNONAA >60 11/24/2021 0818   GFRAA  07/24/2010 0600    >60        The eGFR has been calculated using the MDRD equation. This calculation has not been validated in all clinical situations. eGFR's persistently <60 mL/min signify possible Chronic Kidney Disease.    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       Allergies  Allergen Reactions   Sulfa Antibiotics     Immunization History  Administered Date(s) Administered   Influenza Split 01/10/2011, 12/27/2011, 01/09/2012, 01/26/2015, 12/17/2017, 01/12/2019, 12/19/2019, 12/19/2020   Influenza-Unspecified 12/24/2021   Moderna Covid-19 Vaccine Bivalent Booster 30yr & up 02/05/2021, 09/27/2021, 01/27/2022   Moderna SARS-COV2 Booster Vaccination 03/10/2020, 11/24/2020, 12/11/2021   Moderna Sars-Covid-2 Vaccination 05/07/2019, 06/04/2019   PNEUMOCOCCAL CONJUGATE-20 04/01/2022   Pneumococcal Polysaccharide-23 07/21/2020, 11/10/2020   Respiratory Syncytial Virus Vaccine,Recomb Aduvanted(Arexvy) 04/14/2022   Zoster Recombinat (Shingrix) 09/06/2016, 11/22/2016    Past Medical History:  Diagnosis Date   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   Diverticulosis    Esophageal reflux    Family history of breast cancer 11/25/2021   Hiatal hernia    Hx of colonic polyps    Hypertension    Per new patient packet   Mild asthma    Per new patient packet   Osteoarthritis    Osteopenia    bone density 1-09 and 2-13   Pulmonary nodules    scattered-CT Scan July 2010, 12-10, and 04-2010-all stable felt benign     Tobacco History: Social History   Tobacco Use  Smoking Status Never  Smokeless Tobacco Never   Counseling given: Not Answered   Continue to not smoke  Outpatient Encounter Medications as of 05/11/2022  Medication Sig   acetaminophen (TYLENOL) 500 MG tablet Take 500 mg by mouth as needed.   atenolol (TENORMIN) 25 MG tablet Take 1 tablet (25 mg total) by mouth 2 (two) times daily.   azelastine (ASTELIN) 0.1 % nasal spray Place 2 sprays into both nostrils 2 (two) times daily. Use in each nostril as directed   Cholecalciferol (D3 PO) Take 250 mcg by mouth daily.   cholestyramine (QUESTRAN) 4 g packet Take 1 packet mixed with water or non-carbonated  drink by mouth once daily.   Cyanocobalamin (B-12) 1000 MCG TABS Take by mouth daily.   dexlansoprazole (DEXILANT) 60 MG capsule Take 60 mg by mouth daily.   diclofenac Sodium (VOLTAREN) 1 % GEL Apply topically as needed.   fluticasone (FLONASE ALLERGY RELIEF) 50 MCG/ACT nasal spray Use 1 spray each nostril twice a day for 14 days then use 1 spray each nostril once daily thereafter   fluticasone-salmeterol (  ADVAIR) 500-50 MCG/ACT AEPB Inhale 1 puff into the lungs in the morning and at bedtime.   hydrALAZINE (APRESOLINE) 10 MG tablet Take 1 tablet (10 mg total) by mouth 3 (three) times daily.   HYDROcodone bit-homatropine (HYCODAN) 5-1.5 MG/5ML syrup Take 5 mLs by mouth at bedtime as needed for cough.   hydrocortisone 1 % ointment Apply 1 Application topically 2 (two) times daily. (Patient taking differently: Apply 1 Application topically 2 (two) times daily as needed for itching.)   letrozole (FEMARA) 2.5 MG tablet TAKE ONE TABLET BY MOUTH ONCE DAILY   lidocaine (SALONPAS PAIN RELIEVING) 4 % Place onto the skin as needed.   losartan (COZAAR) 50 MG tablet Take 1 tablet (50 mg total) by mouth 2 (two) times daily.   mirtazapine (REMERON) 15 MG tablet Take 0.5 tablets (7.5 mg total) by mouth at bedtime as needed for sleep.   montelukast (SINGULAIR) 10 MG tablet TAKE ONE TABLET BY MOUTH EVERYDAY AT BEDTIME   omeprazole (PRILOSEC) 20 MG capsule Take 1 capsule by mouth daily.   [DISCONTINUED] pneumococcal 20-valent conjugate vaccine (PREVNAR 20) 0.5 ML injection Inject into the muscle.   [DISCONTINUED] RSV vaccine recomb adjuvanted (AREXVY) 120 MCG/0.5ML injection Inject into the muscle.   No facility-administered encounter medications on file as of 05/11/2022.     Review of Systems  Review of Systems  No chest pain with exertion.  No orthopnea or PND.  Comprehensive review of systems otherwise negative. Physical Exam  BP 134/70 (BP Location: Left Arm, Patient Position: Sitting, Cuff Size:  Normal)   Pulse 66   Temp 98.7 F (37.1 C) (Oral)   Wt 124 lb 9.6 oz (56.5 kg)   SpO2 94%   BMI 22.79 kg/m   Wt Readings from Last 5 Encounters:  05/11/22 124 lb 9.6 oz (56.5 kg)  05/03/22 126 lb 6.4 oz (57.3 kg)  04/05/22 127 lb (57.6 kg)  03/29/22 127 lb (57.6 kg)  03/22/22 128 lb 3.2 oz (58.2 kg)    BMI Readings from Last 5 Encounters:  05/11/22 22.79 kg/m  05/03/22 23.12 kg/m  04/05/22 23.23 kg/m  03/29/22 23.23 kg/m  03/22/22 23.45 kg/m     Physical Exam General: Sitting in chair, no acute distress Eyes: EOMI, icterus Neck: Supple, no JVP Pulmonary: Clear, normal work of breathing Cardiovascular: Warm, no edema Abdomen: Nondistended, bowel sounds present MSK: No synovitis, no joint effusion Neuro: Normal gait, no weakness Psych: Normal mood, full affect   Assessment & Plan:   Chronic cough: Present for many months.  Not improved with addition of PPI.  Does not have breakthrough GERD.  Denies history of asthma or atopy.  No shortness of breath.  Endorsed sensation of postnasal drip.  No improvement with the addition of montelukast and intensify nasal regimen via Flonase and azelastine.  Although it sounds like she would not use it correctly.  Trial ICS/LABA therapy via high-dose Advair.  It is quite possible postnasal drip still the primary driver but she cannot use nasal sprays effectively this will be hard to treat.  Currently she states she is not having a lot of nasal congestion.  Hydrocodone cough syrup is quite effective at stopping the cough.  Hydrocodone cough syrup re was prescribed today.  At night as needed.  Advised her she cannot drive while taking this medication.  Of this while taking it previously.  I insisted she does not drive if she ingests this medication.  Return in about 2 months (around 07/10/2022).   Bonna Gains  Makalyn Lennox, MD 05/12/2022  I spent 40 minutes in the care of the patient including face-to-face visit, review of records,  coordination of care.

## 2022-05-17 ENCOUNTER — Encounter: Payer: Self-pay | Admitting: Internal Medicine

## 2022-05-17 ENCOUNTER — Non-Acute Institutional Stay: Payer: PPO | Admitting: Internal Medicine

## 2022-05-17 ENCOUNTER — Other Ambulatory Visit (HOSPITAL_BASED_OUTPATIENT_CLINIC_OR_DEPARTMENT_OTHER): Payer: Self-pay

## 2022-05-17 ENCOUNTER — Other Ambulatory Visit: Payer: Self-pay | Admitting: Internal Medicine

## 2022-05-17 VITALS — BP 176/92 | HR 77 | Temp 97.7°F | Resp 18 | Ht 62.0 in | Wt 124.6 lb

## 2022-05-17 DIAGNOSIS — D0512 Intraductal carcinoma in situ of left breast: Secondary | ICD-10-CM

## 2022-05-17 DIAGNOSIS — R61 Generalized hyperhidrosis: Secondary | ICD-10-CM | POA: Diagnosis not present

## 2022-05-17 DIAGNOSIS — R053 Chronic cough: Secondary | ICD-10-CM

## 2022-05-17 DIAGNOSIS — R42 Dizziness and giddiness: Secondary | ICD-10-CM | POA: Diagnosis not present

## 2022-05-17 DIAGNOSIS — I10 Essential (primary) hypertension: Secondary | ICD-10-CM | POA: Diagnosis not present

## 2022-05-17 MED ORDER — DEXLANSOPRAZOLE 60 MG PO CPDR
60.0000 mg | DELAYED_RELEASE_CAPSULE | Freq: Every day | ORAL | 11 refills | Status: DC
  Filled 2022-05-17: qty 30, 30d supply, fill #0
  Filled 2022-06-07: qty 30, 30d supply, fill #1

## 2022-05-17 MED ORDER — HYDRALAZINE HCL 25 MG PO TABS
25.0000 mg | ORAL_TABLET | Freq: Three times a day (TID) | ORAL | 3 refills | Status: DC
  Filled 2022-05-17: qty 90, 30d supply, fill #0

## 2022-05-17 MED FILL — Letrozole Tab 2.5 MG: ORAL | 30 days supply | Qty: 30 | Fill #1 | Status: CN

## 2022-05-20 NOTE — Progress Notes (Signed)
Location: [place] of Service:  Clinic (12)  Provider:   Code Status:  Goals of Care:     05/17/2022    2:35 PM  Advanced Directives  Does Patient Have a Medical Advance Directive? Yes  Type of Paramedic of [place];Living will  Does patient want to make changes to medical advance directive? No - Patient declined  Copy of [place] in Chart? Yes - validated most recent copy scanned in chart (See row information)     Chief Complaint  Patient presents with   Medical Management of Chronic Issues    Follow up.and discuss BP   Quality Metric Gaps    Discussed the need for tdap vaccine    HPI: Patient is a 89 y.o. female seen today for an acute visit for Follow up of her Dizziness and Hypertension  [place]   Patient has a history of hypertension  Did not tolerate Norvasc Now on Hydralazine BP at home  still running high with SBP sometimes mor ethen 160  Dizziness Much better now Does have appointment with Neurology  Cough Takes Hydrocodone PRN Pulmonary Added Advair to se e if it will help  also on Singulair and Flonase and Astelin  Continue sot have Night sweats  Past Medical History:  Diagnosis Date   Breast cancer ([place]) 10/2021   left breast DCIS   Diverticulosis    Esophageal reflux    Family history of breast cancer 11/25/2021   Hiatal hernia    Hx of colonic polyps    Hypertension    Per new patient packet   Mild asthma    Per new patient packet   Osteoarthritis    Osteopenia    bone density 1-09 and 2-13   Pulmonary nodules    scattered-CT Scan July 2010, 12-10, and 04-2010-all stable felt benign     Past Surgical History:  Procedure Laterality Date   BREAST LUMPECTOMY WITH RADIOACTIVE SEED LOCALIZATION Left 12/07/2021   Procedure: LEFT BREAST LUMPECTOMY WITH RADIOACTIVE SEED LOCALIZATION;  Surgeon: Jovita Kussmaul, MD;  Location: [facility];   Service: General;  Laterality: Left;   CHOLECYSTECTOMY  04/25/1972   Gall Bladder; [facility]   DG PORTABLE CHEST X RAY ([facility] HX)  2023   Shoulder   DIAGNOSTIC MAMMOGRAM  2022   [facility]; Per new patient packet   JOINT REPLACEMENT Left 08/24/2010   JOINT REPLACEMENT Left 04/25/2010   Reverse    orthroscopic  Rotator Cuff Left 04/25/2009   Partial shoulder Left 04/26/2003   ROTATOR CUFF REPAIR     x 2 1998 and 1999   SPINE SURGERY  04/26/1983   disk   TONSILLECTOMY  04/25/1950   Dr. Nyoka Cowden; Per new patient packet    Allergies  Allergen Reactions   Sulfa Antibiotics     Outpatient Encounter Medications as of 05/17/2022  Medication Sig   acetaminophen (TYLENOL) 500 MG tablet Take 500 mg by mouth as needed.   atenolol (TENORMIN) 25 MG tablet Take 1 tablet (25 mg total) by mouth 2 (two) times daily.   azelastine (ASTELIN) 0.1 % nasal spray Place 2 sprays into both nostrils 2 (two) times daily. Use in each nostril as directed   Cholecalciferol (D3 PO) Take 250 mcg by mouth daily.   cholestyramine (QUESTRAN) 4 g packet Take 1 packet mixed with water or non-carbonated drink by mouth once daily.   Cyanocobalamin (B-12) 1000 MCG TABS  Take by mouth daily.   dexlansoprazole (DEXILANT) 60 MG capsule Take 1 capsule (60 mg total) by mouth daily.   diclofenac Sodium (VOLTAREN) 1 % GEL Apply topically as needed.   fluticasone (FLONASE ALLERGY RELIEF) 50 MCG/ACT nasal spray Use 1 spray each nostril twice a day for 14 days then use 1 spray each nostril once daily thereafter   fluticasone-salmeterol (ADVAIR) 500-50 MCG/ACT AEPB Inhale 1 puff into the lungs in the morning and at bedtime.   hydrALAZINE (APRESOLINE) 25 MG tablet Take 1 tablet (25 mg total) by mouth 3 (three) times daily.   HYDROcodone bit-homatropine (HYCODAN) 5-1.5 MG/5ML syrup Take 5 mLs by mouth at bedtime as needed for cough.   hydrocortisone 1 % ointment Apply 1 Application topically 2 (two) times daily. (Patient taking  differently: Apply 1 Application topically 2 (two) times daily as needed for itching.)   letrozole (FEMARA) 2.5 MG tablet TAKE ONE TABLET BY MOUTH ONCE DAILY   lidocaine (SALONPAS PAIN RELIEVING) 4 % Place onto the skin as needed.   losartan (COZAAR) 50 MG tablet Take 1 tablet (50 mg total) by mouth 2 (two) times daily.   mirtazapine (REMERON) 15 MG tablet Take 0.5 tablets (7.5 mg total) by mouth at bedtime as needed for sleep.   montelukast (SINGULAIR) 10 MG tablet TAKE ONE TABLET BY MOUTH EVERYDAY AT BEDTIME   omeprazole (PRILOSEC) 20 MG capsule Take 1 capsule by mouth daily.   [DISCONTINUED] hydrALAZINE (APRESOLINE) 10 MG tablet Take 1 tablet (10 mg total) by mouth 3 (three) times daily.   No facility-administered encounter medications on file as of 05/17/2022.    Review of Systems:  Review of Systems  Constitutional:  Negative for activity change and appetite change.  HENT: Negative.    Respiratory:  Positive for cough. Negative for shortness of breath.   Cardiovascular:  Negative for leg swelling.  Gastrointestinal:  Negative for constipation.  Genitourinary: Negative.   Musculoskeletal:  Negative for arthralgias, gait problem and myalgias.  Skin: Negative.   Neurological:  Positive for dizziness. Negative for weakness.  Psychiatric/Behavioral:  Negative for confusion, dysphoric mood and sleep disturbance.     Health Maintenance  Topic Date Due   DTaP/Tdap/Td (1 - Tdap) Never done   COVID-19 Vaccine (5 - 2023-24 season) 06/02/2022 (Originally 03/24/2022)   Medicare Annual Wellness (AWV)  09/03/2022   Pneumonia Vaccine 75+ Years old  Completed   INFLUENZA VACCINE  Completed   DEXA SCAN  Completed   Zoster Vaccines- Shingrix  Completed   HPV VACCINES  Aged Out    Physical Exam: Vitals:   05/17/22 1439  BP: (!) 176/92  Pulse: 77  Resp: 18  Temp: 97.7 F (36.5 C)  TempSrc: Temporal  SpO2: 97%  Weight: 124 lb 9.6 oz (56.5 kg)  Height: '5\' 2"'$  (1.575 m)   Body mass  index is 22.79 kg/m. Physical Exam Vitals reviewed.  Constitutional:      Appearance: Normal appearance.  HENT:     Head: Normocephalic.     Nose: Nose normal.     Mouth/Throat:     Mouth: Mucous membranes are moist.     Pharynx: Oropharynx is clear.  Eyes:     Pupils: Pupils are equal, round, and reactive to light.  Cardiovascular:     Rate and Rhythm: Normal rate and regular rhythm.     Pulses: Normal pulses.     Heart sounds: Normal heart sounds. No murmur heard. Pulmonary:     Effort: Pulmonary effort is normal.  Breath sounds: Normal breath sounds.  Abdominal:     General: Abdomen is flat. Bowel sounds are normal.     Palpations: Abdomen is soft.  Musculoskeletal:        General: No swelling.     Cervical back: Neck supple.  Skin:    General: Skin is warm.  Neurological:     General: No focal deficit present.     Mental Status: She is alert and oriented to person, place, and time.  Psychiatric:        Mood and Affect: Mood normal.        Thought Content: Thought content normal.     Labs reviewed: Basic Metabolic Panel: Recent Labs    11/18/21 0000 11/24/21 0818 02/03/22 0205  NA 134* 133* 130*  K 4.5 4.3 4.2  CL 97* 99 95*  CO2 24* 26 27  GLUCOSE  --  90 104*  BUN '13 14 8  '$ CREATININE 0.6 0.68 0.60  CALCIUM 9.1 9.1 9.7  TSH 1.41  --   --    Liver Function Tests: Recent Labs    11/18/21 0000 11/24/21 0818  AST 14 12*  ALT 18 11  ALKPHOS 67 54  BILITOT  --  0.5  PROT  --  6.9  ALBUMIN  --  4.4   No results for input(s): "LIPASE", "AMYLASE" in the last 8760 hours. No results for input(s): "AMMONIA" in the last 8760 hours. CBC: Recent Labs    11/18/21 0000 11/24/21 0818 02/03/22 0205  WBC 7.5 6.4 8.4  NEUTROABS  --  4.4 4.9  HGB 13.7 13.7 14.4  HCT  --  39.3 41.9  MCV  --  82.2 81.2  PLT 328 300 331   Lipid Panel: Recent Labs    11/18/21 0000  CHOL 152  HDL 55  LDLCALC 70  TRIG 136   No results found for:  "HGBA1C"  Procedures since last visit: No results found.  Assessment/Plan 1. Dizziness Seems tobe better now She doe shave appointment with Neurology and also MRI scheduled We discussed about postponing the MRI since her symptoms are stable She will keep her appointment with Neurology  2. Primary hypertension Change Hydralazine 25 mg TID Continue Cozaar and Tenormin  3. Chronic cough Nasal Sprays and now Advair per Pulmonary Hydrocodone Syrup only to use at night as needed  4. Ductal carcinoma in situ (DCIS) of left breast On Femara  5. Night sweats ? Due to Femara Will discuss next visit to add Effexor/Celexa     Labs/tests ordered:  * No order type specified * Next appt:  06/21/2022

## 2022-05-24 ENCOUNTER — Telehealth: Payer: Self-pay

## 2022-05-24 NOTE — Telephone Encounter (Signed)
Spoke with pt as requested regarding her Letrozole.   Pt reports "terrible night sweats" with taking the medication. Pt reports waking up multiple times during the night and having to change her clothes due to the excessive diaphoresis. Pt reports stopping the medication on 05/19/2022, and the night sweats have stopped.   Pt wants to know if there is an alternative to Letrozole to take that will not cause night sweats.

## 2022-05-25 ENCOUNTER — Telehealth: Payer: Self-pay | Admitting: *Deleted

## 2022-05-25 NOTE — Telephone Encounter (Signed)
Per Dr. Benay Spice: Resume letrozole again, if sweats return call and we will change to tamoxifen. Left VM for patient to call back to discuss if she is not able to access the MyChart message being sent.

## 2022-05-26 ENCOUNTER — Other Ambulatory Visit: Payer: PPO

## 2022-05-26 ENCOUNTER — Other Ambulatory Visit (HOSPITAL_BASED_OUTPATIENT_CLINIC_OR_DEPARTMENT_OTHER): Payer: Self-pay

## 2022-05-26 MED FILL — Letrozole Tab 2.5 MG: ORAL | 30 days supply | Qty: 30 | Fill #1 | Status: AC

## 2022-05-27 ENCOUNTER — Other Ambulatory Visit: Payer: Self-pay | Admitting: Orthopedic Surgery

## 2022-05-27 ENCOUNTER — Encounter: Payer: Self-pay | Admitting: Internal Medicine

## 2022-05-27 DIAGNOSIS — I10 Essential (primary) hypertension: Secondary | ICD-10-CM

## 2022-05-27 NOTE — Progress Notes (Signed)
Family request referral to cardiology due to hypertension.

## 2022-05-27 NOTE — Telephone Encounter (Signed)
Attempted to call patient. Tammy Lindsey

## 2022-05-27 NOTE — Telephone Encounter (Signed)
Referral made to cardiology at [facility].

## 2022-06-07 ENCOUNTER — Other Ambulatory Visit (HOSPITAL_BASED_OUTPATIENT_CLINIC_OR_DEPARTMENT_OTHER): Payer: Self-pay

## 2022-06-08 ENCOUNTER — Encounter: Payer: Self-pay | Admitting: Neurology

## 2022-06-08 ENCOUNTER — Other Ambulatory Visit (HOSPITAL_BASED_OUTPATIENT_CLINIC_OR_DEPARTMENT_OTHER): Payer: Self-pay

## 2022-06-08 ENCOUNTER — Ambulatory Visit: Payer: PPO | Admitting: Neurology

## 2022-06-08 VITALS — Ht 62.0 in | Wt 124.2 lb

## 2022-06-08 DIAGNOSIS — R7309 Other abnormal glucose: Secondary | ICD-10-CM

## 2022-06-08 DIAGNOSIS — E531 Pyridoxine deficiency: Secondary | ICD-10-CM

## 2022-06-08 DIAGNOSIS — R55 Syncope and collapse: Secondary | ICD-10-CM | POA: Diagnosis not present

## 2022-06-08 DIAGNOSIS — R42 Dizziness and giddiness: Secondary | ICD-10-CM | POA: Diagnosis not present

## 2022-06-08 DIAGNOSIS — G5793 Unspecified mononeuropathy of bilateral lower limbs: Secondary | ICD-10-CM | POA: Diagnosis not present

## 2022-06-08 DIAGNOSIS — E519 Thiamine deficiency, unspecified: Secondary | ICD-10-CM | POA: Diagnosis not present

## 2022-06-08 DIAGNOSIS — E538 Deficiency of other specified B group vitamins: Secondary | ICD-10-CM

## 2022-06-08 NOTE — Patient Instructions (Addendum)
Carotid dopplers. (She is also going to a cardiologist next week).   Peripheral Neuropathy Peripheral neuropathy is a type of nerve damage. It affects nerves that carry signals between the spinal cord and the arms, legs, and the rest of the body (peripheral nerves). It does not affect nerves in the spinal cord or brain. In peripheral neuropathy, one nerve or a group of nerves may be damaged. Peripheral neuropathy is a broad category that includes many specific nerve disorders, like diabetic neuropathy, hereditary neuropathy, and carpal tunnel syndrome. What are the causes? This condition may be caused by: Certain diseases, such as: Diabetes. This is the most common cause of peripheral neuropathy. Autoimmune diseases, such as rheumatoid arthritis and systemic lupus erythematosus. Nerve diseases that are passed from parent to child (inherited). Kidney disease. Thyroid disease. Other causes may include: Nerve injury. Pressure or stress on a nerve that lasts a long time. Lack (deficiency) of B vitamins. This can result from alcoholism, poor diet, or a restricted diet. Infections. Some medicines, such as cancer medicines (chemotherapy). Poisonous (toxic) substances, such as lead and mercury. Too little blood flowing to the legs. In some cases, the cause of this condition is not known. What are the signs or symptoms? Symptoms of this condition depend on which of your nerves is damaged. Symptoms in the legs, hands, and arms can include: Loss of feeling (numbness) in the feet, hands, or both. Tingling in the feet, hands, or both. Burning pain. Very sensitive skin. Weakness. Not being able to move a part of the body (paralysis). Clumsiness or poor coordination. Muscle twitching. Loss of balance. Symptoms in other parts of the body can include: Not being able to control your bladder. Feeling dizzy. Sexual problems. How is this diagnosed? Diagnosing and finding the cause of peripheral  neuropathy can be difficult. Your health care provider will take your medical history and do a physical exam. A neurological exam will also be done. This involves checking things that are affected by your brain, spinal cord, and nerves (nervous system). For example, your health care provider will check your reflexes, how you move, and what you can feel. You may have other tests, such as: Blood tests. Electromyogram (EMG) and nerve conduction tests. These tests check nerve function and how well the nerves are controlling the muscles. Imaging tests, such as a CT scan or MRI, to rule out other causes of your symptoms. Removing a small piece of nerve to be examined in a lab (nerve biopsy). Removing and examining a small amount of the fluid that surrounds the brain and spinal cord (lumbar puncture). How is this treated? Treatment for this condition may involve: Treating the underlying cause of the neuropathy, such as diabetes, kidney disease, or vitamin deficiencies. Stopping medicines that can cause neuropathy, such as chemotherapy. Medicine to help relieve pain. Medicines may include: Prescription or over-the-counter pain medicine. Anti-seizure medicine. Antidepressants. Pain-relieving patches that are applied to painful areas of skin. Surgery to relieve pressure on a nerve or to destroy a nerve that is causing pain. Physical therapy to help improve movement and balance. Devices to help you move around (assistive devices). Follow these instructions at home: Medicines Take over-the-counter and prescription medicines only as told by your health care provider. Do not take any other medicines without first asking your health care provider. Ask your health care provider if the medicine prescribed to you requires you to avoid driving or using machinery. Lifestyle  Do not use any products that contain nicotine or tobacco. These  products include cigarettes, chewing tobacco, and vaping devices, such as  e-cigarettes. Smoking keeps blood from reaching damaged nerves. If you need help quitting, ask your health care provider. Avoid or limit alcohol. Too much alcohol can cause a vitamin B deficiency, and vitamin B is needed for healthy nerves. Eat a healthy diet. This includes: Eating foods that are high in fiber, such as beans, whole grains, and fresh fruits and vegetables. Limiting foods that are high in fat and processed sugars, such as fried or sweet foods. General instructions  If you have diabetes, work closely with your health care provider to keep your blood sugar under control. If you have numbness in your feet: Check every day for signs of injury or infection. Watch for redness, warmth, and swelling. Wear padded socks and comfortable shoes. These help protect your feet. Develop a good support system. Living with peripheral neuropathy can be stressful. Consider talking with a mental health specialist or joining a support group. Use assistive devices and attend physical therapy as told by your health care provider. This may include using a walker or a cane. Keep all follow-up visits. This is important. Where to find more information [employer] of Neurological Disorders: [employer] Contact a health care provider if: You have new signs or symptoms of peripheral neuropathy. You are struggling emotionally from dealing with peripheral neuropathy. Your pain is not well controlled. Get help right away if: You have an injury or infection that is not healing normally. You develop new weakness in an arm or leg. You have fallen or do so frequently. Summary Peripheral neuropathy is when the nerves in the arms or legs are damaged, resulting in numbness, weakness, or pain. There are many causes of peripheral neuropathy, including diabetes, pinched nerves, vitamin deficiencies, autoimmune disease, and hereditary conditions. Diagnosing and finding the cause of peripheral neuropathy can  be difficult. Your health care provider will take your medical history, do a physical exam, and do tests, including blood tests and nerve function tests. Treatment involves treating the underlying cause of the neuropathy and taking medicines to help control pain. Physical therapy and assistive devices may also help. This information is not intended to replace advice given to you by your health care provider. Make sure you discuss any questions you have with your health care provider. Document Revised: 12/15/2020 Document Reviewed: 12/15/2020 Elsevier Patient Education  [employer].

## 2022-06-08 NOTE — Progress Notes (Addendum)
[id number]    Provider:  Dr Jaynee Eagles Requesting Provider: Virgie Dad, MD Primary Care Provider:  Virgie Dad, MD  CC:  lightheadedness  HPI:  Tammy Lindsey is a 89 y.o. female here as requested by Virgie Dad, MD for dizziness. Dizziness stars 20 minutes after taking 3 blood pressure medications. She takes them the same time. She feels lightheaded, no vertigo, no room spinning, not passing out, after 20 minutes get numbness from the ears down the neck (points down the carotids. She did not get the mri brain. She is not dizzy walking. Dizziness lasts into the afternoon improves until she has to take her BP meds again. Not dizziness feels lightheadedness. Doesn't haooen when walking or when standing. She can tell when her BP is going up. No vision changes, no weakness, she keeps going just uncomfortable. Started right after a medication change. Not lightheaded now but will be again at lunch when take next medication. No pulsatile tinnitus. No presyncope. Sitting or lying down makes it better  Reviewed notes, labs and imaging from outside physicians, which showed :     Latest Ref Rng & Units 02/03/2022    2:05 AM 11/24/2021    8:18 AM 11/18/2021   12:00 AM  CMP  Glucose 70 - 99 mg/dL 104  90    BUN 8 - 23 mg/dL 8  14  13      $ Creatinine 0.44 - 1.00 mg/dL 0.60  0.68  0.6      Sodium 135 - 145 mmol/L 130  133  134      Potassium 3.5 - 5.1 mmol/L 4.2  4.3  4.5      Chloride 98 - 111 mmol/L 95  99  97      CO2 22 - 32 mmol/L 27  26  24      $ Calcium 8.9 - 10.3 mg/dL 9.7  9.1  9.1      Total Protein 6.5 - 8.1 g/dL  6.9    Total Bilirubin 0.3 - 1.2 mg/dL  0.5    Alkaline Phos 38 - 126 U/L  54  67      AST 15 - 41 U/L  12  14      ALT 0 - 44 U/L  11  18         This result is from an external source.      Latest Ref Rng & Units 02/03/2022    2:05 AM 11/24/2021    8:18 AM 11/18/2021   12:00 AM  CBC  WBC 4.0 - 10.5 K/uL 8.4  6.4  7.5   Hemoglobin 12.0 - 15.0  g/dL 14.4  13.7  13.7   Hematocrit 36.0 - 46.0 % 41.9  39.3    Platelets 150 - 400 K/uL 331  300  328    CT head 01/14/21: EXAM: CT HEAD WITHOUT CONTRAST   TECHNIQUE: Contiguous axial images were obtained from the base of the skull through the vertex without intravenous contrast.   COMPARISON:  Brain MRI dated 07/10/2015.   FINDINGS: Brain: Mild age-related atrophy and chronic microvascular ischemic changes. 5 there is no acute intracranial hemorrhage. No mass effect or midline shift. No extra-axial fluid collection.   Vascular: No hyperdense vessel or unexpected calcification.   Skull: Normal. Negative for fracture or focal lesion.   Sinuses/Orbits: No acute finding.   Other: None   IMPRESSION: 1. No acute intracranial pathology. 2. Mild age-related atrophy and chronic microvascular ischemic  changes.  Review of Systems: Patient complains of symptoms per HPI as well as the following symptoms lightheaded. Pertinent negatives and positives per HPI. All others negative.   Social History   Socioeconomic History   Marital status: Widowed    Spouse name: Not on file   Number of children: 2   Years of education: Not on file   Highest education level: Not on file  Occupational History   Occupation: Retired  Tobacco Use   Smoking status: Never   Smokeless tobacco: Never  Vaping Use   Vaping Use: Never used  Substance and Sexual Activity   Alcohol use: Not Currently   Drug use: No   Sexual activity: Not Currently    Birth control/protection: Post-menopausal  Other Topics Concern   Not on file  Social History Narrative   Diet: Left blank      Caffeine: Yes      Married, if yes what year: Widow; married in [place] you live in a house, apartment, assisted living, condo, trailer, ect: [facility]       Is it one or more stories: [employer]       How many persons live in your home? One      Pets: Cat      Highest level or education completed: Left  blank      Current/Past profession: Artist       Exercise: Yes                 Type and how often: Walk         Living Will: Yes   DNR: Yes    POA/HPOA: Left blank       Functional Status:   Do you have difficulty bathing or dressing yourself? Left blank    Do you have difficulty preparing food or eating? Left blank    Do you have difficulty managing your medications? Left blank    Do you have difficulty managing your finances? Left blank    Do you have difficulty affording your medications? Left blank    Social Determinants of Health   Financial Resource Strain: Low Risk  (12/21/2021)   Overall Financial Resource Strain (CARDIA)    Difficulty of Paying Living Expenses: Not hard at all  Food Insecurity: No Food Insecurity (12/21/2021)   Hunger Vital Sign    Worried About Running Out of Food in the Last Year: Never true    Ran Out of Food in the Last Year: Never true  Transportation Needs: No Transportation Needs (12/21/2021)   PRAPARE - Hydrologist (Medical): No    Lack of Transportation (Non-Medical): No  Physical Activity: Not on file  Stress: Not on file  Social Connections: Not on file  Intimate Partner Violence: Not At Risk (12/21/2021)   Humiliation, Afraid, Rape, and Kick questionnaire    Fear of Current or Ex-Partner: No    Emotionally Abused: No    Physically Abused: No    Sexually Abused: No    Family History  Problem Relation Age of Onset   Cancer Mother        cervical   Alzheimer's disease Mother    Asthma Father    Other Father        Cerebral hemorrhage   Heart disease Sister        Heart disease before age 56   Hypertension Sister    Stroke Maternal Grandfather  Heart attack Maternal Grandfather    Breast cancer Daughter 50   Asthma Son    Cancer Cousin        dx early 82s; unknown type; paternal female cousin   Neuropathy Neg Hx     Past Medical History:  Diagnosis Date   Breast cancer ([place]) 10/2021   left  breast DCIS   Diverticulosis    Esophageal reflux    Family history of breast cancer 11/25/2021   Hiatal hernia    Hx of colonic polyps    Hypertension    Per new patient packet   Mild asthma    Per new patient packet   Osteoarthritis    Osteopenia    bone density 1-09 and 2-13   Pulmonary nodules    scattered-CT Scan July 2010, 12-10, and 04-2010-all stable felt benign     Patient Active Problem List   Diagnosis Date Noted   Genetic testing 12/16/2021   Family history of breast cancer 11/25/2021   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   Lumbar radiculopathy 10/28/2020   Lightheadedness 10/15/2020   Excessive cerumen in both ear canals 10/15/2020   Degeneration of lumbar intervertebral disc 06/02/2017   Numbness 07/02/2015   Major depressive disorder, single episode, in full remission ([place]) 06/09/2015   Varicose veins of lower extremities with other complications [id number]   Cough, persistent 01/22/2012    Past Surgical History:  Procedure Laterality Date   BREAST LUMPECTOMY WITH RADIOACTIVE SEED LOCALIZATION Left 12/07/2021   Procedure: LEFT BREAST LUMPECTOMY WITH RADIOACTIVE SEED LOCALIZATION;  Surgeon: Jovita Kussmaul, MD;  Location: [facility];  Service: General;  Laterality: Left;   CHOLECYSTECTOMY  04/25/1972   Gall Bladder; [facility]   DG PORTABLE CHEST X RAY ([facility])  2023   Shoulder   DIAGNOSTIC MAMMOGRAM  2022   [facility]; Per new patient packet   JOINT REPLACEMENT Left 08/24/2010   JOINT REPLACEMENT Left 04/25/2010   Reverse    orthroscopic  Rotator Cuff Left 04/25/2009   Partial shoulder Left 04/26/2003   ROTATOR CUFF REPAIR     x 2 1998 and 1999   SPINE SURGERY  04/26/1983   disk   TONSILLECTOMY  04/25/1950   Dr. Nyoka Cowden; Per new patient packet    Current Outpatient Medications  Medication Sig Dispense Refill   acetaminophen (TYLENOL) 500 MG tablet Take 500 mg by mouth as needed.     atenolol (TENORMIN) 25 MG  tablet Take 1 tablet (25 mg total) by mouth 2 (two) times daily. 90 tablet 3   Cholecalciferol (D3 PO) Take 250 mcg by mouth daily.     cholestyramine (QUESTRAN) 4 g packet Take 1 packet mixed with water or non-carbonated drink by mouth once daily. 90 packet 0   Cyanocobalamin (B-12) 1000 MCG TABS Take by mouth daily.     dexlansoprazole (DEXILANT) 60 MG capsule Take 1 capsule (60 mg total) by mouth daily. 30 capsule 11   diclofenac Sodium (VOLTAREN) 1 % GEL Apply topically as needed.     fluticasone (FLONASE ALLERGY RELIEF) 50 MCG/ACT nasal spray Use 1 spray each nostril twice a day for 14 days then use 1 spray each nostril once daily thereafter 18.2 mL 11   fluticasone-salmeterol (ADVAIR) 500-50 MCG/ACT AEPB Inhale 1 puff into the lungs in the morning and at bedtime. 60 each 2   hydrALAZINE (APRESOLINE) 25 MG tablet Take 1 tablet (25 mg total) by mouth 3 (three) times daily. 90 tablet 3  HYDROcodone bit-homatropine (HYCODAN) 5-1.5 MG/5ML syrup Take 5 mLs by mouth at bedtime as needed for cough. 240 mL 0   letrozole (FEMARA) 2.5 MG tablet TAKE ONE TABLET BY MOUTH ONCE DAILY 30 tablet 5   lidocaine (SALONPAS PAIN RELIEVING) 4 % Place onto the skin as needed.     losartan (COZAAR) 50 MG tablet Take 1 tablet (50 mg total) by mouth 2 (two) times daily. 60 tablet 3   mirtazapine (REMERON) 15 MG tablet Take 0.5 tablets (7.5 mg total) by mouth at bedtime as needed for sleep. 45 tablet 4   montelukast (SINGULAIR) 10 MG tablet TAKE ONE TABLET BY MOUTH EVERYDAY AT BEDTIME 30 tablet 3   omeprazole (PRILOSEC) 20 MG capsule Take 1 capsule by mouth daily.     No current facility-administered medications for this visit.    Allergies as of 06/08/2022 - Review Complete 06/08/2022  Allergen Reaction Noted   Sulfa antibiotics  01/12/2012    Vitals: Ht 5' 2"$  (1.575 m)   Wt 124 lb 3.2 oz (56.3 kg)   BMI 22.72 kg/m  Last Weight:  Wt Readings from Last 1 Encounters:  06/08/22 124 lb 3.2 oz (56.3 kg)    Last Height:   Ht Readings from Last 1 Encounters:  06/08/22 5' 2"$  (1.575 m)     Physical exam: Exam: Gen: NAD, conversant, well nourised, well groomed                     CV: RRR, no MRG. No Carotid Bruits. No peripheral edema, warm, nontender Eyes: Conjunctivae clear without exudates or hemorrhage  Neuro: Detailed Neurologic Exam  Speech:    Speech is normal; fluent and spontaneous with normal comprehension.  Cognition:    The patient is oriented to person, place, and time;     recent and remote memory intact;     language fluent;     normal attention, concentration,     fund of knowledge Cranial Nerves:    The pupils are equal, round, and reactive to light. Attempted, could not visualize fundi small pupils Visual fields are full to finger confrontation. Extraocular movements are intact. Trigeminal sensation is intact and the muscles of mastication are normal. The face is symmetric. The palate elevates in the midline. Hearing intact. Voice is normal. Shoulder shrug is normal. The tongue has normal motion without fasciculations.   Coordination:    Normal finger to nose and heel to shin.   Gait:    Heel-toe and tandem gait are basically normal some minor imbalance but did well  Motor Observation:    No asymmetry, no atrophy, and no involuntary movements noted. Tone:    Normal muscle tone.    Posture:    Posture is normal. normal erect    Strength: slight left arm prox weakness says chronic due to injury.     Strength is V/V in the upper and lower limbs.      Sensation: decreased pp to the ankle, otherwise intact proprioception and vibration.      Reflex Exam:  DTR's:    Deep tendon reflexes in the upper and lower extremities are normal bilaterally.   Toes:    The toes are downgoing bilaterally.   Clonus:    Clonus is absent.    Assessment/Plan:   Tammy Lindsey is a lovely 89 y.o. female here as requested by Virgie Dad, MD  - Lightheadedness: And says  she is lightheaded but only after she takes her blood pressure  medications.  Denied vertigo or dizziness.  Sitting down and laying down makes her feel better.  Likely a medication effect, follow-up with primary care may be she can space the medications out.  However we will order Carotid dopplers. (She is also going to a cardiologist next week).  -I do not think the lightheadedness is of neurologic pathology, her exam was basically normal and she declines MRi brain and exam is not concerning for stroke but will check blood vessels in the neck with the carotid Dopplers -She has a Small-fiber neuropathy we will order a series of test today -She does not want to follow-up with [place], she prefers to follow-up as needed.  Laying 5 minutes 102/66 p 68 Standing 140/69 p 68 Standing 3 minutes 130/69 p 70  Orders Placed This Encounter  Procedures   Hemoglobin A1c   B12 and Folate Panel   Methylmalonic acid, serum   TSH Rfx on Abnormal to Free T4   Vitamin B1   Vitamin B6   RPR   Hepatitis C antibody   Heavy metals, blood   Multiple Myeloma Panel (SPEP&IFE w/QIG)   VAS US CAROTID   No orders of the defined types were placed in this encounter.   Cc: Virgie Dad, MD,  Virgie Dad, MD  Sarina Ill, MD  [facility] [address] [place], [place] [address]  Phone [phone] Fax [phone]

## 2022-06-09 ENCOUNTER — Other Ambulatory Visit (HOSPITAL_BASED_OUTPATIENT_CLINIC_OR_DEPARTMENT_OTHER): Payer: Self-pay

## 2022-06-09 LAB — TSH RFX ON ABNORMAL TO FREE T4: TSH: 0.798 u[IU]/mL (ref 0.450–4.500)

## 2022-06-09 MED ORDER — TETANUS-DIPHTH-ACELL PERTUSSIS 5-2.5-18.5 LF-MCG/0.5 IM SUSY
PREFILLED_SYRINGE | INTRAMUSCULAR | 0 refills | Status: DC
  Filled 2022-06-09: qty 0.5, 1d supply, fill #0

## 2022-06-13 ENCOUNTER — Encounter: Payer: Self-pay | Admitting: Neurology

## 2022-06-14 ENCOUNTER — Ambulatory Visit (HOSPITAL_COMMUNITY)
Admission: RE | Admit: 2022-06-14 | Discharge: 2022-06-14 | Disposition: A | Discharge status: Home / Self Care | Payer: PPO | Source: Ambulatory Visit | Attending: Neurology | Admitting: Neurology

## 2022-06-14 DIAGNOSIS — R42 Dizziness and giddiness: Secondary | ICD-10-CM | POA: Insufficient documentation

## 2022-06-14 DIAGNOSIS — R55 Syncope and collapse: Secondary | ICD-10-CM | POA: Diagnosis not present

## 2022-06-14 LAB — MULTIPLE MYELOMA PANEL, SERUM
Albumin SerPl Elph-Mcnc: 4.2 g/dL (ref 2.9–4.4)
Albumin/Glob SerPl: 2 — ABNORMAL HIGH (ref 0.7–1.7)
Alpha 1: 0.2 g/dL (ref 0.0–0.4)
Alpha2 Glob SerPl Elph-Mcnc: 0.7 g/dL (ref 0.4–1.0)
B-Globulin SerPl Elph-Mcnc: 0.7 g/dL (ref 0.7–1.3)
Gamma Glob SerPl Elph-Mcnc: 0.7 g/dL (ref 0.4–1.8)
Globulin, Total: 2.2 g/dL (ref 2.2–3.9)
IgA/Immunoglobulin A, Serum: 163 mg/dL (ref 64–422)
IgG (Immunoglobin G), Serum: 730 mg/dL (ref 586–1602)
IgM (Immunoglobulin M), Srm: 96 mg/dL (ref 26–217)
Total Protein: 6.4 g/dL (ref 6.0–8.5)

## 2022-06-14 LAB — VITAMIN B1: Thiamine: 100.7 nmol/L (ref 66.5–200.0)

## 2022-06-14 LAB — VITAMIN B6: Vitamin B6: 8.7 ug/L (ref 3.4–65.2)

## 2022-06-14 LAB — HEMOGLOBIN A1C
Est. average glucose Bld gHb Est-mCnc: 111 mg/dL
Hgb A1c MFr Bld: 5.5 % (ref 4.8–5.6)

## 2022-06-14 LAB — HEPATITIS C ANTIBODY: Hep C Virus Ab: NONREACTIVE

## 2022-06-14 LAB — B12 AND FOLATE PANEL
Folate: 11.2 ng/mL (ref >3.0)
Vitamin B-12: 1123 pg/mL (ref 232–1245)

## 2022-06-14 LAB — METHYLMALONIC ACID, SERUM: Methylmalonic Acid: 113 nmol/L (ref 0–378)

## 2022-06-14 LAB — HEAVY METALS, BLOOD
Arsenic: 1 ug/L (ref 0–9)
Lead, Blood: 1.9 ug/dL (ref 0.0–3.4)
Mercury: 1.2 ug/L (ref 0.0–14.9)

## 2022-06-14 LAB — RPR: RPR Ser Ql: NONREACTIVE

## 2022-06-15 ENCOUNTER — Ambulatory Visit (HOSPITAL_BASED_OUTPATIENT_CLINIC_OR_DEPARTMENT_OTHER): Payer: PPO | Admitting: Cardiology

## 2022-06-15 ENCOUNTER — Encounter (HOSPITAL_BASED_OUTPATIENT_CLINIC_OR_DEPARTMENT_OTHER): Payer: Self-pay | Admitting: Cardiology

## 2022-06-15 ENCOUNTER — Other Ambulatory Visit (HOSPITAL_BASED_OUTPATIENT_CLINIC_OR_DEPARTMENT_OTHER): Payer: Self-pay

## 2022-06-15 VITALS — BP 110/60 | HR 72 | Ht 62.0 in | Wt 124.2 lb

## 2022-06-15 DIAGNOSIS — R053 Chronic cough: Secondary | ICD-10-CM | POA: Diagnosis not present

## 2022-06-15 DIAGNOSIS — R42 Dizziness and giddiness: Secondary | ICD-10-CM | POA: Diagnosis not present

## 2022-06-15 DIAGNOSIS — I10 Essential (primary) hypertension: Secondary | ICD-10-CM

## 2022-06-15 DIAGNOSIS — Z7189 Other specified counseling: Secondary | ICD-10-CM | POA: Diagnosis not present

## 2022-06-15 MED ORDER — HYDRALAZINE HCL 10 MG PO TABS
10.0000 mg | ORAL_TABLET | Freq: Three times a day (TID) | ORAL | 11 refills | Status: DC | PRN
  Filled 2022-06-15: qty 30, 10d supply, fill #0
  Filled 2022-09-07 – 2022-09-08 (×2): qty 30, 10d supply, fill #1

## 2022-06-15 MED ORDER — AMLODIPINE BESYLATE 2.5 MG PO TABS
2.5000 mg | ORAL_TABLET | Freq: Every day | ORAL | 3 refills | Status: DC
  Filled 2022-06-15: qty 90, 90d supply, fill #0

## 2022-06-15 NOTE — Progress Notes (Signed)
Cardiology Office Note:    Date:  06/15/2022   ID:  Tammy Lindsey 01-May-1931, MRN [id number]  PCP:  Virgie Dad, MD  Cardiologist:  Buford Dresser, MD  Referring MD: Yvonna Alanis, NP   CC: new patient evaluation for hypertension  History of Present Illness:    Tammy Lindsey is a 89 y.o. female with a hx of hypertension, breast cancer, osteopenia, and asthma who is seen as a new consult at the request of [place], [place], NP for the evaluation and management of hypertension.  Comorbid conditions: Hypertension.  Family history: Her son has A-Fib.   Prior cardiac testing and/or incidental findings on other testing (ie coronary calcium): LE venous duplex, Bilateral carotid duplex  Exercise level: She used to like exercising but lately has been unable to.   Overall, she is feeling well today  She reports episodes of hypertension and notes her blood pressure has been fluctuating. She monitors her blood pressures at home and has brought in a log. She has low readings such as 109/49 and 113/53, but she notes it has gone as high as 0000000 systolic. She reports that when her blood pressure is high she feels pressure in her head and headaches. She tends to lay down and do breathing exercising to help. She denies any sickness, pain, or anxiety during at the time of these episodes.  She also endorses dizziness and fatigue that she attributes to her Hydralazine 25 mg which she states lowers her blood pressure.   She endorses a dry cough and associated neck pain. She reports the neck pain worsens when she coughs. She has been following up with a doctor for this issue.   She denies any palpitations, chest pain, shortness of breath, or peripheral edema. No syncope, orthopnea, or PND.  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   Diverticulosis    Esophageal reflux    Family history of breast cancer 11/25/2021   Hiatal hernia    Hx of  colonic polyps    Hypertension    Per new patient packet   Mild asthma    Per new patient packet   Osteoarthritis    Osteopenia    bone density 1-09 and 2-13   Pulmonary nodules    scattered-CT Scan July 2010, 12-10, and 04-2010-all stable felt benign     Past Surgical History:  Procedure Laterality Date   BREAST LUMPECTOMY WITH RADIOACTIVE SEED LOCALIZATION Left 12/07/2021   Procedure: LEFT BREAST LUMPECTOMY WITH RADIOACTIVE SEED LOCALIZATION;  Surgeon: Jovita Kussmaul, MD;  Location: [facility];  Service: General;  Laterality: Left;   CHOLECYSTECTOMY  04/25/1972   Gall Bladder; [facility]   DG PORTABLE CHEST X RAY ([place] [facility])  2023   Shoulder   DIAGNOSTIC MAMMOGRAM  2022   [facility]; Per new patient packet   JOINT REPLACEMENT Left 08/24/2010   JOINT REPLACEMENT Left 04/25/2010   Reverse    orthroscopic  Rotator Cuff Left 04/25/2009   Partial shoulder Left 04/26/2003   ROTATOR CUFF REPAIR     x 2 1998 and 1999   SPINE SURGERY  04/26/1983   disk   TONSILLECTOMY  04/25/1950   Dr. Nyoka Cowden; Per new patient packet    Current Medications: Current Outpatient Medications on File Prior to Visit  Medication Sig   acetaminophen (TYLENOL) 500 MG tablet Take 500 mg by mouth as needed.   atenolol (TENORMIN) 25 MG  tablet Take 1 tablet (25 mg total) by mouth 2 (two) times daily.   Cholecalciferol (D3 PO) Take 250 mcg by mouth daily.   cholestyramine (QUESTRAN) 4 g packet Take 1 packet mixed with water or non-carbonated drink by mouth once daily.   Cyanocobalamin (B-12) 1000 MCG TABS Take by mouth daily.   dexlansoprazole (DEXILANT) 60 MG capsule Take 1 capsule (60 mg total) by mouth daily.   diclofenac Sodium (VOLTAREN) 1 % GEL Apply topically as needed.   HYDROcodone bit-homatropine (HYCODAN) 5-1.5 MG/5ML syrup Take 5 mLs by mouth at bedtime as needed for cough.   letrozole (FEMARA) 2.5 MG tablet TAKE ONE TABLET BY MOUTH ONCE DAILY   lidocaine (SALONPAS PAIN RELIEVING) 4 %  Place onto the skin as needed.   losartan (COZAAR) 50 MG tablet Take 1 tablet (50 mg total) by mouth 2 (two) times daily.   mirtazapine (REMERON) 15 MG tablet Take 0.5 tablets (7.5 mg total) by mouth at bedtime as needed for sleep.   montelukast (SINGULAIR) 10 MG tablet TAKE ONE TABLET BY MOUTH EVERYDAY AT BEDTIME   omeprazole (PRILOSEC) 20 MG capsule Take 1 capsule by mouth daily.   Tdap (BOOSTRIX) 5-2.5-18.5 LF-MCG/0.5 injection Inject into the muscle.   No current facility-administered medications on file prior to visit.     Allergies:   Sulfa antibiotics   Social History   Tobacco Use   Smoking status: Never   Smokeless tobacco: Never  Vaping Use   Vaping Use: Never used  Substance Use Topics   Alcohol use: Not Currently   Drug use: No    Family History: family history includes Alzheimer's disease in her mother; Asthma in her father and son; Breast cancer (age of onset: 33) in her daughter; Cancer in her cousin and mother; Heart attack in her maternal grandfather; Heart disease in her sister; Hypertension in her sister; Other in her father; Stroke in her maternal grandfather. There is no history of Neuropathy.  ROS:   Please see the history of present illness.  Additional pertinent ROS: Constitutional: Negative for chills, fever, night sweats, unintentional weight loss. Positive for cough.  HENT: Negative for ear pain and hearing loss.   Eyes: Negative for loss of vision and eye pain.  Respiratory: Negative for sputum, wheezing.   Cardiovascular: See HPI. Gastrointestinal: Negative for abdominal pain, melena, and hematochezia.  Genitourinary: Negative for dysuria and hematuria.  Musculoskeletal: Negative for falls. Positive for chronic neck pain. Skin: Negative for itching and rash.  Neurological: Negative for focal weakness, focal sensory changes and loss of consciousness. Positive for dizziness, headaches Endo/Heme/Allergies: Does not bruise/bleed easily.     EKGs/Labs/Other Studies Reviewed:    The following studies were reviewed today:  Bilateral Carotid Duplex 06/14/2022: Summary:  Right Carotid: Velocities in the right ICA are consistent with a 40-59% stenosis. Non-hemodynamically significant plaque <50% noted in the CCA. The ECA appears <50% stenosed.   Left Carotid: Velocities in the left ICA are consistent with a 1-39%  stenosis. Non-hemodynamically significant plaque <50% noted in the  CCA.   Vertebrals: Bilateral vertebral arteries demonstrate antegrade flow.  Right vertebral artery demonstrates high resistant flow.   Subclavians: Right subclavian artery was stenotic. Normal flow  hemodynamics were seen in the left subclavian artery.   EKG:  EKG is personally reviewed.   06/15/2022: Sinus rhythm. Rate 72 bpm. PRWP.   Recent Labs: 11/24/2021: ALT 11 02/03/2022: BUN 8; Creatinine, Ser 0.60; Hemoglobin 14.4; Platelets 331; Potassium 4.2; Sodium 130 06/08/2022: TSH 0.798  Recent Lipid Panel    Component Value Date/Time   CHOL 152 11/18/2021 0000   TRIG 136 11/18/2021 0000   HDL 55 11/18/2021 0000   LDLCALC 70 11/18/2021 0000    Physical Exam:    VS:  BP 110/60 (BP Location: Left Arm, Patient Position: Sitting, Cuff Size: Normal)   Pulse 72   Ht 5' 2"$  (1.575 m)   Wt 124 lb 3.2 oz (56.3 kg)   BMI 22.72 kg/m     Wt Readings from Last 3 Encounters:  06/15/22 124 lb 3.2 oz (56.3 kg)  06/08/22 124 lb 3.2 oz (56.3 kg)  05/17/22 124 lb 9.6 oz (56.5 kg)    GEN: Well nourished, well developed in no acute distress HEENT: Normal, moist mucous membranes NECK: No JVD CARDIAC: regular rhythm, normal S1 and S2, no rubs or gallops. No murmur. VASCULAR: Radial and DP pulses 2+ bilaterally. No carotid bruits RESPIRATORY:  Clear to auscultation without rales, wheezing or rhonchi  ABDOMEN: Soft, non-tender, non-distended MUSCULOSKELETAL:  Ambulates independently SKIN: Warm and dry, no edema NEUROLOGIC:  Alert and oriented x 3.  No focal neuro deficits noted. PSYCHIATRIC:  Normal affect    ASSESSMENT:    1. Essential hypertension   2. Lightheadedness   3. Cough, persistent   4. Counseling on health promotion and disease prevention    PLAN:    Hypertension Intermittent lightheadedness -reviewed home BP log. Majority of days BP is well controlled. She rarely has elevations, but when she does she feels a fullness in her head. -reviewed her med history. Has been on amlodipine in the past, most recently in the fall. Noted that this was stopped for dizziness/lightheadedness. She does not feel that symptoms changed once amlodipine was stopped. She feels more lightheaded on hydralazine and has dropped the midday dose -discussed options. Will start with retrialing low dose amlodipine, changing hydralazine from 25 mg TID to 10 mg PRN for systolic BP [id number] -she endorses fatigue. She has been on atenlol 50 mg BID for years, but this is a high dose. Once BP stable, consider scaling back the dosing to see if fatigue improves -will follow up closely, she will contact me with any issues  Cough, persistent -on ARB, would not expect cough with this  Counseling on prevention Encouraged activity, especially walking. She does well with this.  Plan for follow up: ~1 month with Laurann Montana or sooner as needed.  Buford Dresser, MD, PhD, [facility]    Medication Adjustments/Labs and Tests Ordered: Current medicines are reviewed at length with the patient today.  Concerns regarding medicines are outlined above.   Orders Placed This Encounter  Procedures   EKG 12-Lead   Meds ordered this encounter  Medications   amLODipine (NORVASC) 2.5 MG tablet    Sig: Take 1 tablet (2.5 mg total) by mouth daily.    Dispense:  90 tablet    Refill:  3   hydrALAZINE (APRESOLINE) 10 MG tablet    Sig: Take 1 tablet (10 mg total) by mouth 3 (three) times daily as needed (for top number of the blood pressure  over 160).    Dispense:  30 tablet    Refill:  11   Patient Instructions  Medication Instructions:  We are going to try the low dose of amlodipine again (tried it in the fall, noted dizziness, but based on our talk today doesn't sound like it changed when you stopped it). If you have issues or problems, let me  know.  We are going to stop the hydralazine as a routine medicine and change it to as needed only. I want you to take 10 mg hydralazine when your blood pressure is more than 160 on the top number and you are having those feelings of fullness in your head.   *If you need a refill on your cardiac medications before your next appointment, please call your pharmacy*  Follow-Up: At [facility], you and your health needs are our priority.  As part of our continuing mission to provide you with exceptional heart care, we have created designated Provider Care Teams.  These Care Teams include your primary Cardiologist (physician) and Advanced Practice Providers (APPs -  Physician Assistants and Nurse Practitioners) who all work together to provide you with the care you need, when you need it.  We recommend signing up for the patient portal called "MyChart".  Sign up information is provided on this After Visit Summary.  MyChart is used to connect with patients for Virtual Visits (Telemedicine).  Patients are able to view lab/test results, encounter notes, upcoming appointments, etc.  Non-urgent messages can be sent to your provider as well.   To learn more about what you can do with MyChart, go to [email or web address].    Your next appointment:   1-2 month(s)  Provider:   Laurann Montana, NP      I,Rachel Rivera,acting as a scribe for Buford Dresser, MD.,have documented all relevant documentation on the behalf of Buford Dresser, MD,as directed by  Buford Dresser, MD while in the presence of Buford Dresser, MD.  I, Buford Dresser, MD, have reviewed  all documentation for this visit. The documentation on 06/15/22 for the exam, diagnosis, procedures, and orders are all accurate and complete.   Signed, Buford Dresser, MD PhD 06/15/2022 11:54 AM    [place]

## 2022-06-15 NOTE — Patient Instructions (Addendum)
Medication Instructions:  We are going to try the low dose of amlodipine again (tried it in the fall, noted dizziness, but based on our talk today doesn't sound like it changed when you stopped it). If you have issues or problems, let me know.  We are going to stop the hydralazine as a routine medicine and change it to as needed only. I want you to take 10 mg hydralazine when your blood pressure is more than 160 on the top number and you are having those feelings of fullness in your head.   *If you need a refill on your cardiac medications before your next appointment, please call your pharmacy*  Follow-Up: At [facility], you and your health needs are our priority.  As part of our continuing mission to provide you with exceptional heart care, we have created designated Provider Care Teams.  These Care Teams include your primary Cardiologist (physician) and Advanced Practice Providers (APPs -  Physician Assistants and Nurse Practitioners) who all work together to provide you with the care you need, when you need it.  We recommend signing up for the patient portal called "MyChart".  Sign up information is provided on this After Visit Summary.  MyChart is used to connect with patients for Virtual Visits (Telemedicine).  Patients are able to view lab/test results, encounter notes, upcoming appointments, etc.  Non-urgent messages can be sent to your provider as well.   To learn more about what you can do with MyChart, go to [email or web address].    Your next appointment:   1-2 month(s)  Provider:   Laurann Montana, NP

## 2022-06-16 ENCOUNTER — Telehealth: Payer: Self-pay | Admitting: *Deleted

## 2022-06-16 NOTE — Telephone Encounter (Signed)
-----   Message from Melvenia Beam, MD sent at 06/16/2022  1:36 PM EST ----- Carotid Doppler showed no significant carotid disease.  The left carotid shows no significant plaque.  The right carotid has a little bit of additional plaque but nothing concerning at this time for any sort of intervention.  However she should have her primary care follow this up in about a year just to watch it.  I will CC her primary care on this thank you.

## 2022-06-16 NOTE — Telephone Encounter (Signed)
Spoke to patient gave Carotid Doppler results Pt states already saw results on mychart  Gave Dr Ferdinand Lango recommendation  to f/u with PCP in a year     Pt expressed understanding and thanked me for calling

## 2022-06-21 ENCOUNTER — Encounter: Payer: Self-pay | Admitting: Internal Medicine

## 2022-06-21 ENCOUNTER — Non-Acute Institutional Stay: Payer: PPO | Admitting: Internal Medicine

## 2022-06-21 ENCOUNTER — Other Ambulatory Visit (HOSPITAL_BASED_OUTPATIENT_CLINIC_OR_DEPARTMENT_OTHER): Payer: Self-pay

## 2022-06-21 VITALS — BP 132/62 | HR 70 | Temp 97.6°F | Resp 17 | Ht 62.0 in | Wt 124.0 lb

## 2022-06-21 DIAGNOSIS — R053 Chronic cough: Secondary | ICD-10-CM

## 2022-06-21 DIAGNOSIS — R61 Generalized hyperhidrosis: Secondary | ICD-10-CM | POA: Diagnosis not present

## 2022-06-21 DIAGNOSIS — R42 Dizziness and giddiness: Secondary | ICD-10-CM | POA: Diagnosis not present

## 2022-06-21 DIAGNOSIS — I10 Essential (primary) hypertension: Secondary | ICD-10-CM

## 2022-06-21 MED ORDER — PANTOPRAZOLE SODIUM 40 MG PO TBEC
40.0000 mg | DELAYED_RELEASE_TABLET | Freq: Every day | ORAL | 3 refills | Status: DC
  Filled 2022-06-21: qty 30, 30d supply, fill #0
  Filled 2022-07-19: qty 30, 30d supply, fill #1
  Filled 2022-08-22: qty 30, 30d supply, fill #2
  Filled 2022-09-19: qty 30, 30d supply, fill #3

## 2022-06-21 NOTE — Patient Instructions (Signed)
Calcium citrate with Vit D '600mg'$  with meals once a day

## 2022-06-22 NOTE — Progress Notes (Signed)
Location: [place] of Service:  Clinic (12)  Provider:   Code Status:  Goals of Care:     05/17/2022    2:35 PM  Advanced Directives  Does Patient Have a Medical Advance Directive? Yes  Type of Paramedic of [place];Living will  Does patient want to make changes to medical advance directive? No - Patient declined  Copy of [place] in Chart? Yes - validated most recent copy scanned in chart (See row information)     Chief Complaint  Patient presents with   Medical Management of Chronic Issues    Follow up and discuss medications     HPI: Patient is a 89 y.o. female seen today for an acute visit for Follow up  Lives in [place] in [place]   Patient has a history of hypertension  Saw Cardiology and Now is back on Norvasc low dose to see if it helps her BP and Not cause Dizziness So far she is doing well Her BP still Fluctuates but dizziness is better Also saw Neurology Wants to wait for her MRI Carotid Dopplers showed Right ICA showed stenosis of > 50% Need follow up Cough Sees Pulmonary Chronic Advair / Helps Uses Hydrocodone syrup sometimes to help   Night sweats On Letrozole Discussing with Dr Benay Spice to change it   Past Medical History:  Diagnosis Date   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   Diverticulosis    Esophageal reflux    Family history of breast cancer 11/25/2021   Hiatal hernia    Hx of colonic polyps    Hypertension    Per new patient packet   Mild asthma    Per new patient packet   Osteoarthritis    Osteopenia    bone density 1-09 and 2-13   Pulmonary nodules    scattered-CT Scan July 2010, 12-10, and 04-2010-all stable felt benign     Past Surgical History:  Procedure Laterality Date   BREAST LUMPECTOMY WITH RADIOACTIVE SEED LOCALIZATION Left 12/07/2021   Procedure: LEFT BREAST LUMPECTOMY WITH RADIOACTIVE SEED LOCALIZATION;  Surgeon: Jovita Kussmaul, MD;   Location: [facility];  Service: General;  Laterality: Left;   CHOLECYSTECTOMY  04/25/1972   Gall Bladder; [facility]   DG PORTABLE CHEST X RAY ([place] HX)  2023   Shoulder   DIAGNOSTIC MAMMOGRAM  2022   [facility]; Per new patient packet   JOINT REPLACEMENT Left 08/24/2010   JOINT REPLACEMENT Left 04/25/2010   Reverse    orthroscopic  Rotator Cuff Left 04/25/2009   Partial shoulder Left 04/26/2003   ROTATOR CUFF REPAIR     x 2 1998 and 1999   SPINE SURGERY  04/26/1983   disk   TONSILLECTOMY  04/25/1950   Dr. Nyoka Cowden; Per new patient packet    Allergies  Allergen Reactions   Sulfa Antibiotics     Outpatient Encounter Medications as of 06/21/2022  Medication Sig   acetaminophen (TYLENOL) 500 MG tablet Take 500 mg by mouth as needed.   amLODipine (NORVASC) 2.5 MG tablet Take 1 tablet (2.5 mg total) by mouth daily.   atenolol (TENORMIN) 25 MG tablet Take 1 tablet (25 mg total) by mouth 2 (two) times daily.   Cholecalciferol (D3 PO) Take 250 mcg by mouth daily.   cholestyramine (QUESTRAN) 4 g packet Take 1 packet mixed with water or non-carbonated drink by mouth once daily.   Cyanocobalamin (B-12) 1000 MCG TABS  Take by mouth daily.   diclofenac Sodium (VOLTAREN) 1 % GEL Apply topically as needed.   hydrALAZINE (APRESOLINE) 10 MG tablet Take 1 tablet (10 mg total) by mouth 3 (three) times daily as needed (for top number of the blood pressure over 160).   HYDROcodone bit-homatropine (HYCODAN) 5-1.5 MG/5ML syrup Take 5 mLs by mouth at bedtime as needed for cough.   letrozole (FEMARA) 2.5 MG tablet TAKE ONE TABLET BY MOUTH ONCE DAILY   lidocaine (SALONPAS PAIN RELIEVING) 4 % Place onto the skin as needed.   losartan (COZAAR) 50 MG tablet Take 1 tablet (50 mg total) by mouth 2 (two) times daily.   mirtazapine (REMERON) 15 MG tablet Take 0.5 tablets (7.5 mg total) by mouth at bedtime as needed for sleep.   pantoprazole (PROTONIX) 40 MG tablet Take 1 tablet (40 mg total) by mouth  daily.   Tdap (BOOSTRIX) 5-2.5-18.5 LF-MCG/0.5 injection Inject into the muscle.   [DISCONTINUED] omeprazole (PRILOSEC) 20 MG capsule Take 1 capsule by mouth daily.   montelukast (SINGULAIR) 10 MG tablet TAKE ONE TABLET BY MOUTH EVERYDAY AT BEDTIME (Patient not taking: Reported on 06/21/2022)   [DISCONTINUED] dexlansoprazole (DEXILANT) 60 MG capsule Take 1 capsule (60 mg total) by mouth daily.   No facility-administered encounter medications on file as of 06/21/2022.    Review of Systems:  Review of Systems  Constitutional:  Negative for activity change and appetite change.  HENT: Negative.    Respiratory:  Negative for cough and shortness of breath.   Cardiovascular:  Negative for leg swelling.  Gastrointestinal:  Negative for constipation.  Genitourinary: Negative.   Musculoskeletal:  Negative for arthralgias, gait problem and myalgias.  Skin: Negative.   Neurological:  Positive for dizziness. Negative for weakness.  Psychiatric/Behavioral:  Negative for confusion, dysphoric mood and sleep disturbance.     Health Maintenance  Topic Date Due   COVID-19 Vaccine (5 - 2023-24 season) 02/15/2023 (Originally 03/24/2022)   Medicare Annual Wellness (AWV)  09/03/2022   DTaP/Tdap/Td (2 - Td or Tdap) 06/13/2032   Pneumonia Vaccine 59+ Years old  Completed   INFLUENZA VACCINE  Completed   DEXA SCAN  Completed   Zoster Vaccines- Shingrix  Completed   HPV VACCINES  Aged Out    Physical Exam: Vitals:   06/21/22 1043  BP: 132/62  Pulse: 70  Resp: 17  Temp: 97.6 F (36.4 C)  TempSrc: Temporal  SpO2: 98%  Weight: 124 lb (56.2 kg)  Height: '5\' 2"'$  (1.575 m)   Body mass index is 22.68 kg/m. Physical Exam Vitals reviewed.  Constitutional:      Appearance: Normal appearance.  HENT:     Head: Normocephalic.     Nose: Nose normal.     Mouth/Throat:     Mouth: Mucous membranes are moist.     Pharynx: Oropharynx is clear.  Eyes:     Pupils: Pupils are equal, round, and reactive to  light.  Cardiovascular:     Rate and Rhythm: Normal rate and regular rhythm.     Pulses: Normal pulses.     Heart sounds: Normal heart sounds. No murmur heard. Pulmonary:     Effort: Pulmonary effort is normal.     Breath sounds: Normal breath sounds.  Abdominal:     General: Abdomen is flat. Bowel sounds are normal.     Palpations: Abdomen is soft.  Musculoskeletal:        General: No swelling.     Cervical back: Neck supple.  Skin:  General: Skin is warm.  Neurological:     General: No focal deficit present.     Mental Status: She is alert and oriented to person, place, and time.  Psychiatric:        Mood and Affect: Mood normal.        Thought Content: Thought content normal.     Labs reviewed: Basic Metabolic Panel: Recent Labs    11/18/21 0000 11/24/21 0818 02/03/22 0205 06/08/22 1231  NA 134* 133* 130*  --   K 4.5 4.3 4.2  --   CL 97* 99 95*  --   CO2 24* 26 27  --   GLUCOSE  --  90 104*  --   BUN '13 14 8  '$ --   CREATININE 0.6 0.68 0.60  --   CALCIUM 9.1 9.1 9.7  --   TSH 1.41  --   --  0.798   Liver Function Tests: Recent Labs    11/18/21 0000 11/24/21 0818 06/08/22 1224  AST 14 12*  --   ALT 18 11  --   ALKPHOS 67 54  --   BILITOT  --  0.5  --   PROT  --  6.9 6.4  ALBUMIN  --  4.4  --    No results for input(s): "LIPASE", "AMYLASE" in the last 8760 hours. No results for input(s): "AMMONIA" in the last 8760 hours. CBC: Recent Labs    11/18/21 0000 11/24/21 0818 02/03/22 0205  WBC 7.5 6.4 8.4  NEUTROABS  --  4.4 4.9  HGB 13.7 13.7 14.4  HCT  --  39.3 41.9  MCV  --  82.2 81.2  PLT 328 300 331   Lipid Panel: Recent Labs    11/18/21 0000  CHOL 152  HDL 55  LDLCALC 70  TRIG 136   Lab Results  Component Value Date   HGBA1C 5.5 06/08/2022    Procedures since last visit: VAS US CAROTID  Result Date: 06/15/2022 Carotid Arterial Duplex Study Patient Name:  ELAYAH ENWRIGHT [facility]  Date of Exam:   06/14/2022 Medical Rec #: [id number]       Accession #:    [id number] Date of Birth: [birth year]       Patient Gender: F Patient Age:   26 years Exam Location:  [facility] Procedure:      VAS US CAROTID Referring Phys: Sarina Ill --------------------------------------------------------------------------------  Indications:       Carotid artery disease and Dizziness and giddiness, tingling                    in neck. Comparison Study:  01/14/21 CT Head Performing Technologist: Elta Guadeloupe RVT, RDMS  Examination Guidelines: A complete evaluation includes B-mode imaging, spectral Doppler, color Doppler, and power Doppler as needed of all accessible portions of each vessel. Bilateral testing is considered an integral part of a complete examination. Limited examinations for reoccurring indications may be performed as noted.  Right Carotid Findings: +----------+--------+--------+--------+------------------------------+---------+           PSV cm/sEDV cm/sStenosisPlaque Description            Comments  +----------+--------+--------+--------+------------------------------+---------+ CCA Prox  94      17                                                      +----------+--------+--------+--------+------------------------------+---------+  CCA Mid   96      16                                                      +----------+--------+--------+--------+------------------------------+---------+ CCA Distal98      18      <50%    diffuse, heterogenous and     Shadowing                                   calcific                                +----------+--------+--------+--------+------------------------------+---------+ ICA Prox  147     27              diffuse, irregular,                                                       heterogenous and calcific               +----------+--------+--------+--------+------------------------------+---------+ ICA Mid   173     40      40-59%  diffuse, heterogenous,                                                     irregular and calcific                  +----------+--------+--------+--------+------------------------------+---------+ ICA Distal95      25                                                      +----------+--------+--------+--------+------------------------------+---------+ ECA       152     19      <50%    irregular and calcific        shadowing +----------+--------+--------+--------+------------------------------+---------+ +----------+--------+-------+--------+-------------------+           PSV cm/sEDV cmsDescribeArm Pressure (mmHG) +----------+--------+-------+--------+-------------------+ [id number]            Stenotic176                 +----------+--------+-------+--------+-------------------+ +---------+--------+--+--------+--+----------------------------+ VertebralPSV cm/s49EDV cm/s14Antegrade and High resistant +---------+--------+--+--------+--+----------------------------+  Left Carotid Findings: +----------+-------+--------+--------+--------------------------------+--------+           PSV    EDV cm/sStenosisPlaque Description              Comments           cm/s                                                            +----------+-------+--------+--------+--------------------------------+--------+  CCA Prox  99     16                                                       +----------+-------+--------+--------+--------------------------------+--------+ CCA Mid   111    26                                                       +----------+-------+--------+--------+--------------------------------+--------+ CCA Distal108    22      <50%    diffuse, heterogenous and                                                 calcific                                 +----------+-------+--------+--------+--------------------------------+--------+ ICA Prox  132    28      1-39%    heterogenous                             +----------+-------+--------+--------+--------------------------------+--------+ ICA Mid   81     23      1-39%   heterogenous and calcific                +----------+-------+--------+--------+--------------------------------+--------+ ICA Distal99     25                                                       +----------+-------+--------+--------+--------------------------------+--------+ ECA       101    8                                                        +----------+-------+--------+--------+--------------------------------+--------+ +----------+--------+--------+----------------+-------------------+           PSV cm/sEDV cm/sDescribe        Arm Pressure (mmHG) +----------+--------+--------+----------------+-------------------+ [id number]             Multiphasic, [id number]                 +----------+--------+--------+----------------+-------------------+ +---------+--------+--+--------+--+---------+ VertebralPSV cm/s65EDV cm/s15Antegrade +---------+--------+--+--------+--+---------+   Summary: Right Carotid: Velocities in the right ICA are consistent with a 40-59%                stenosis. Non-hemodynamically significant plaque <50% noted in                the CCA. The ECA appears <50% stenosed. Left Carotid: Velocities in the left ICA are consistent with a 1-39% stenosis.               Non-hemodynamically significant plaque <50% noted in the CCA. Vertebrals:  Bilateral vertebral arteries demonstrate antegrade flow. Right              vertebral artery demonstrates high resistant flow. Subclavians: Right subclavian artery was stenotic. Normal flow hemodynamics were              seen in the left subclavian artery. *See table(s) above for measurements and observations.  Electronically signed by Jamelle Haring on 06/15/2022 at 8:03:36 AM.    Final     Assessment/Plan 1. Primary hypertension On Norvasc and Atenolol and  Cozaar Also on Hydralazine 10 mg Prn So far she is doing well  2. Dizziness Seen Neurology Symptoms are better Will wait for MRI  3. Chronic cough Follows with Pulmonary  4. Night sweats Oncology to review to see if need to chang from Letrozole to Tamoxifen    Labs/tests ordered:  * No order type specified * Next appt:  09/27/2022

## 2022-07-06 ENCOUNTER — Other Ambulatory Visit (HOSPITAL_BASED_OUTPATIENT_CLINIC_OR_DEPARTMENT_OTHER): Payer: Self-pay

## 2022-07-07 DIAGNOSIS — D2271 Melanocytic nevi of right lower limb, including hip: Secondary | ICD-10-CM | POA: Diagnosis not present

## 2022-07-07 DIAGNOSIS — D2272 Melanocytic nevi of left lower limb, including hip: Secondary | ICD-10-CM | POA: Diagnosis not present

## 2022-07-07 DIAGNOSIS — D2261 Melanocytic nevi of right upper limb, including shoulder: Secondary | ICD-10-CM | POA: Diagnosis not present

## 2022-07-07 DIAGNOSIS — D1801 Hemangioma of skin and subcutaneous tissue: Secondary | ICD-10-CM | POA: Diagnosis not present

## 2022-07-07 DIAGNOSIS — L821 Other seborrheic keratosis: Secondary | ICD-10-CM | POA: Diagnosis not present

## 2022-07-07 DIAGNOSIS — Z85828 Personal history of other malignant neoplasm of skin: Secondary | ICD-10-CM | POA: Diagnosis not present

## 2022-07-07 DIAGNOSIS — D2239 Melanocytic nevi of other parts of face: Secondary | ICD-10-CM | POA: Diagnosis not present

## 2022-07-07 DIAGNOSIS — R208 Other disturbances of skin sensation: Secondary | ICD-10-CM | POA: Diagnosis not present

## 2022-07-07 DIAGNOSIS — L814 Other melanin hyperpigmentation: Secondary | ICD-10-CM | POA: Diagnosis not present

## 2022-07-12 DIAGNOSIS — M5416 Radiculopathy, lumbar region: Secondary | ICD-10-CM | POA: Diagnosis not present

## 2022-07-18 ENCOUNTER — Encounter: Payer: Self-pay | Admitting: Internal Medicine

## 2022-07-18 ENCOUNTER — Encounter: Payer: PPO | Admitting: Adult Health

## 2022-07-18 NOTE — Progress Notes (Unsigned)
Cardiology Office Note:    Date:  07/19/2022   ID:  Tammy Lindsey, DOB Aug 23, 1931, MRN [id number]  PCP:  Virgie Dad, MD  [facility] Cardiologist:  Buford Dresser, MD    Referring MD: Virgie Dad, MD   Chief Complaint:  Fatigue    Patient Profile:  Hypertension:  Current regimen Losartan 50mg , Atenolol 25mg  BID, Amlodipine 2.5mg  QD, Hydralazine 10mg  PRN  Lightheadedness  DCIS of left breast  Lumbar Radiculopathy  Degeneration of lumbar intervertebral disc      History of Present Illness:   Tammy Lindsey is a 89 y.o. female with the above problem list.  She was last seen by Dr. Harrell Gave one month ago for hypertension evaluation/management. At the time of that visit, patient's BP log showed that her pressures typically were near goal with Hydralazine 25mg  TID. However, she was feeling chronic fatigue/brain fog with mild dizziness/lightheadedness. Hydralazine was decreased to 10mg  PRN and patient was restarted on Amlodipine 2.5mg  (previously stopped due to concern that this was causing dizziness). With these changes, patient reports that she continues to feel chronically fatigued/foggy-headed but does not have dizziness/lightheadedness. She says that symptoms are most noticeable in the morning after taking medications. Also reports intermittent temple area pressure. Says this is different than her hypertension driven headaches. Of note, she has a pending brain MRI ordered by PCP to further evaluate dizziness/head symptoms.   Her BP log today shows that pressures are typically low 130s/60s. She has only needed to take Hydralazine twice for elevated BP since her last visit and says that she had symptoms of headache with both episodes of elevated BP. BP is elevated at today's visit but patient says that she did not take her Amlodipine this morning.     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   Cholecalciferol (D3 PO) Take 250 mcg by mouth daily.   cholestyramine (QUESTRAN) 4 g packet Take 1 packet mixed with water or non-carbonated drink by mouth once daily.   Cyanocobalamin (B-12) 1000 MCG TABS Take by mouth daily.   diclofenac Sodium (VOLTAREN) 1 % GEL Apply topically as needed.   hydrALAZINE (APRESOLINE) 10 MG tablet Take 1 tablet (10 mg total) by mouth 3 (three) times daily as needed (for top number of the blood pressure over 160).   HYDROcodone bit-homatropine (HYCODAN) 5-1.5 MG/5ML syrup Take 5 mLs by mouth at bedtime as needed for cough.   letrozole (FEMARA) 2.5 MG tablet TAKE ONE TABLET BY MOUTH ONCE DAILY   lidocaine (SALONPAS PAIN RELIEVING) 4 % Place onto the skin as needed.   losartan (COZAAR) 50 MG tablet Take 1 tablet (50 mg total) by mouth 2 (two) times daily.   mirtazapine (REMERON) 15 MG tablet Take 0.5 tablets (7.5 mg total) by mouth at bedtime as needed for  sleep.   montelukast (SINGULAIR) 10 MG tablet TAKE ONE TABLET BY MOUTH EVERYDAY AT BEDTIME   pantoprazole (PROTONIX) 40 MG tablet Take 1 tablet (40 mg total) by mouth daily.   Tdap (BOOSTRIX) 5-2.5-18.5 LF-MCG/0.5 injection Inject into the muscle.   [DISCONTINUED] amLODipine (NORVASC) 2.5 MG tablet Take 1 tablet (2.5 mg total) by mouth daily.   [DISCONTINUED] atenolol (TENORMIN) 25 MG tablet Take 1 tablet (25 mg total) by mouth 2 (two) times daily.    Allergies:   Sulfa antibiotics   Social History   Occupational  History   Occupation: Retired  Tobacco Use   Smoking status: Never   Smokeless tobacco: Never  Vaping Use   Vaping Use: Never used  Substance and Sexual Activity   Alcohol use: Not Currently   Drug use: No   Sexual activity: Not Currently    Birth control/protection: Post-menopausal    Family Hx: The patient's family history includes Alzheimer's disease in her mother; Asthma in her father and son; Breast cancer (age of onset: 6) in her daughter; Cancer in her cousin and mother; Heart attack in her maternal grandfather; Heart disease in her sister; Hypertension in her sister; Other in her father; Stroke in her maternal grandfather. There is no history of Neuropathy.  Review of Systems  Constitutional: Positive for malaise/fatigue. Negative for weight gain.  HENT:         Reports intermittent temple area pressure. Says this is different than her hypertension driven headaches.  Cardiovascular:  Negative for chest pain, claudication, dyspnea on exertion, irregular heartbeat, leg swelling, near-syncope, orthopnea, palpitations and syncope.  All other systems reviewed and are negative.    EKGs/Labs/Other Test Reviewed:    EKG:  EKG is not ordered today.          Recent Labs: 11/24/2021: ALT 11 02/03/2022: BUN 8; Creatinine, Ser 0.60; Hemoglobin 14.4; Platelets 331; Potassium 4.2; Sodium 130 06/08/2022: TSH 0.798   Recent Lipid Panel Recent Labs    11/18/21 0000  CHOL 152  TRIG 136  HDL 55  LDLCALC 70      Risk Assessment/Calculations/Metrics:         HYPERTENSION CONTROL Vitals:   07/19/22 0947 07/19/22 1210  BP: (!) 145/73 (!) 146/66    The patient's blood pressure is elevated above target today.  In order to address the patient's elevated BP: Follow up with general cardiology has been recommended.; A current anti-hypertensive medication was adjusted today.       Physical Exam:    VS:  BP (!) 146/66   Pulse 80   Ht 5\' 2"  (1.575 m)   Wt 125 lb 1.6 oz (56.7  kg)   SpO2 95%   BMI 22.88 kg/m     Wt Readings from Last 3 Encounters:  07/19/22 125 lb 1.6 oz (56.7 kg)  06/21/22 124 lb (56.2 kg)  06/15/22 124 lb 3.2 oz (56.3 kg)    Vitals reviewed.  Constitutional:      Appearance: Healthy appearance.  Eyes:     Pupils: Pupils are equal, round, and reactive to light.  Pulmonary:     Effort: Pulmonary effort is normal.  Cardiovascular:     PMI at left midclavicular line. Normal rate. Regular rhythm. Normal S1. Normal S2.      Murmurs: There is no murmur.     No gallop.  No click. No rub.  Pulses:    Intact distal pulses.  Edema:    Peripheral edema absent.  Abdominal:  General: Bowel sounds are normal.     Palpations: Abdomen is soft.  Musculoskeletal: Normal range of motion. Skin:    General: Skin is warm and dry.  Neurological:     Mental Status: Alert and oriented to person, place and time.         ASSESSMENT & PLAN:   Hypertension Patient with BP generally at goal but ongoing chronic fatigue/"foggy-headed" sensation. Given persistent symptoms despite change in Hydralazine dosing frequency (and previously discontinuing Amlodipine), suspect that atenolol may be driving factor. Will decrease Atenolol to 25mg  nightly. Increase Amlodipine to 5mg  AM dosing. Continue as needed hydralazine for elevated systolic BP >150mg . Will follow up with patient in MyChart in a week to see how BP and symptoms of fatigue have responded to these changes. Follow up in 1 month.  Lightheadedness Patient reports that lightheadedness is less of a dizzy/near passing out sensation and more of a brain fog and fatigue. As above, will decrease Atenolol frequency. Patient should continue to follow up with her PCP as well, has a pending brain MRI.             Dispo:  1 month  Medication Adjustments/Labs and Tests Ordered: Current medicines are reviewed at length with the patient today.  Concerns regarding medicines are outlined above.  Tests Ordered: No  orders of the defined types were placed in this encounter.  Medication Changes: Meds ordered this encounter  Medications   amLODipine (NORVASC) 5 MG tablet    Sig: Take 1 tablet (5 mg total) by mouth daily with breakfast.    Dispense:  90 tablet    Refill:  3   atenolol (TENORMIN) 25 MG tablet    Sig: Take 1 tablet (25 mg total) by mouth at bedtime.    Dispense:  90 tablet    Refill:  3   Signed, Lily Kocher, PA-C  07/19/2022 1:20 PM    [facility] [place], [place], [place]  [address] Phone: [phone]; Fax: [phone]

## 2022-07-19 ENCOUNTER — Encounter: Payer: Self-pay | Admitting: Oncology

## 2022-07-19 ENCOUNTER — Ambulatory Visit (HOSPITAL_BASED_OUTPATIENT_CLINIC_OR_DEPARTMENT_OTHER): Payer: PPO | Admitting: Cardiology

## 2022-07-19 ENCOUNTER — Other Ambulatory Visit (HOSPITAL_BASED_OUTPATIENT_CLINIC_OR_DEPARTMENT_OTHER): Payer: Self-pay

## 2022-07-19 ENCOUNTER — Encounter (HOSPITAL_BASED_OUTPATIENT_CLINIC_OR_DEPARTMENT_OTHER): Payer: Self-pay | Admitting: Cardiology

## 2022-07-19 VITALS — BP 146/66 | HR 80 | Ht 62.0 in | Wt 125.1 lb

## 2022-07-19 DIAGNOSIS — R42 Dizziness and giddiness: Secondary | ICD-10-CM | POA: Diagnosis not present

## 2022-07-19 DIAGNOSIS — I10 Essential (primary) hypertension: Secondary | ICD-10-CM

## 2022-07-19 MED ORDER — ATENOLOL 25 MG PO TABS
25.0000 mg | ORAL_TABLET | Freq: Every day | ORAL | 3 refills | Status: DC
  Filled 2022-07-19: qty 90, 90d supply, fill #0
  Filled 2022-10-12: qty 90, 90d supply, fill #1
  Filled 2023-01-12: qty 90, 90d supply, fill #2
  Filled 2023-04-13: qty 90, 90d supply, fill #3

## 2022-07-19 MED ORDER — AMLODIPINE BESYLATE 5 MG PO TABS
5.0000 mg | ORAL_TABLET | Freq: Every day | ORAL | 3 refills | Status: DC
  Filled 2022-07-19: qty 90, 90d supply, fill #0
  Filled 2022-08-22 – 2022-08-23 (×2): qty 90, 90d supply, fill #1

## 2022-07-19 NOTE — Patient Instructions (Signed)
Medication Instructions:  Your physician has recommended you make the following change in your medication:   Increase: Amlodipine 5mg  daily in the morning ( you can take two of your 2.5mg  tablets together to use them up)   Decrease: Atenolol 25mg  daily in the evening    *If you need a refill on your cardiac medications before your next appointment, please call your pharmacy*  Follow-Up: At [facility], you and your health needs are our priority.  As part of our continuing mission to provide you with exceptional heart care, we have created designated Provider Care Teams.  These Care Teams include your primary Cardiologist (physician) and Advanced Practice Providers (APPs -  Physician Assistants and Nurse Practitioners) who all work together to provide you with the care you need, when you need it.  We recommend signing up for the patient portal called "MyChart".  Sign up information is provided on this After Visit Summary.  MyChart is used to connect with patients for Virtual Visits (Telemedicine).  Patients are able to view lab/test results, encounter notes, upcoming appointments, etc.  Non-urgent messages can be sent to your provider as well.   To learn more about what you can do with MyChart, go to [email or web address].    Your next appointment:   1 month(s)  Provider:   Buford Dresser, MD or Laurann Montana, NP

## 2022-07-19 NOTE — Assessment & Plan Note (Addendum)
Patient with BP generally at goal but ongoing chronic fatigue/"foggy-headed" sensation. Given persistent symptoms despite change in Hydralazine dosing frequency (and previously discontinuing Amlodipine), suspect that atenolol may be driving factor. Will decrease Atenolol to 25mg  nightly. Increase Amlodipine to 5mg  AM dosing. Continue as needed hydralazine for elevated systolic BP >150mg . Will follow up with patient in MyChart in a week to see how BP and symptoms of fatigue have responded to these changes. Follow up in 1 month.

## 2022-07-19 NOTE — Assessment & Plan Note (Signed)
Patient reports that lightheadedness is less of a dizzy/near passing out sensation and more of a brain fog and fatigue. As above, will decrease Atenolol frequency. Patient should continue to follow up with her PCP as well, has a pending brain MRI.

## 2022-07-21 ENCOUNTER — Telehealth: Payer: Self-pay | Admitting: Oncology

## 2022-07-21 NOTE — Telephone Encounter (Signed)
Attempted to contact patient in regards to schedule telephone visit with Dr. Benay Spice, no answer so voicemail was left for patient to call back

## 2022-07-25 ENCOUNTER — Telehealth: Payer: Self-pay | Admitting: *Deleted

## 2022-07-25 ENCOUNTER — Encounter: Payer: Self-pay | Admitting: Oncology

## 2022-07-25 NOTE — Telephone Encounter (Signed)
Tammy Lindsey took last letrozole ~ 3 days ago and has not had any hot flashes since. When she was taking it, she was having up to 3/night hot flashes. Wants to change therapy.  Per Dr. Benay Spice, could try tamoxifen, but needs to be seen or have telephone visit to discuss prior to ordering it. [name] agrees. Scheduling message sent.

## 2022-07-26 ENCOUNTER — Encounter (HOSPITAL_BASED_OUTPATIENT_CLINIC_OR_DEPARTMENT_OTHER): Payer: Self-pay

## 2022-07-26 NOTE — Telephone Encounter (Signed)
Follow up from patient appointment, see also patient message to triage yesterday.

## 2022-07-27 ENCOUNTER — Encounter: Payer: Self-pay | Admitting: Internal Medicine

## 2022-07-27 ENCOUNTER — Encounter (INDEPENDENT_AMBULATORY_CARE_PROVIDER_SITE_OTHER): Payer: PPO | Admitting: Neurology

## 2022-07-27 DIAGNOSIS — G5793 Unspecified mononeuropathy of bilateral lower limbs: Secondary | ICD-10-CM

## 2022-07-27 NOTE — Telephone Encounter (Signed)
Message routed to Gupta, Anjali L, MD  

## 2022-07-28 ENCOUNTER — Encounter: Payer: Self-pay | Admitting: Internal Medicine

## 2022-07-29 ENCOUNTER — Other Ambulatory Visit: Payer: Self-pay | Admitting: Internal Medicine

## 2022-07-29 DIAGNOSIS — I10 Essential (primary) hypertension: Secondary | ICD-10-CM

## 2022-07-29 DIAGNOSIS — R42 Dizziness and giddiness: Secondary | ICD-10-CM

## 2022-08-01 NOTE — Telephone Encounter (Signed)
  Thank you for your message seeking medical advice.* My assessment and recommendation are as follows:  Hi Jo, I already evaluated you for the symptoms in your feet and I could not find an answer why. All the blood work I did for cause of the symptoms in your feet were negative. Sometimes we just do not know why people get symptoms like this in the feet and you need to be treated for your symptoms to make you more comfortable and your primary care can do that.  You should see your primary care. I hope you feel better!  Sincerely,  Anson Fret, MD    *This exchange required the expertise of a doctor, nurse practitioner, physician assistant, optometrist or certified nurse midwife and qualifies as a Medical Advice Message, please visit [email or web address] for more details. [facility] will bill your insurance on your behalf; copays and deductibles may apply. Questions? Reply to this message.

## 2022-08-02 ENCOUNTER — Inpatient Hospital Stay: Payer: PPO | Attending: Oncology | Admitting: Oncology

## 2022-08-02 ENCOUNTER — Other Ambulatory Visit (HOSPITAL_BASED_OUTPATIENT_CLINIC_OR_DEPARTMENT_OTHER): Payer: Self-pay

## 2022-08-02 DIAGNOSIS — D0512 Intraductal carcinoma in situ of left breast: Secondary | ICD-10-CM | POA: Diagnosis not present

## 2022-08-02 MED ORDER — ANASTROZOLE 1 MG PO TABS
1.0000 mg | ORAL_TABLET | Freq: Every day | ORAL | 1 refills | Status: DC
  Filled 2022-08-02: qty 30, 30d supply, fill #0
  Filled 2022-08-27 (×2): qty 30, 30d supply, fill #1

## 2022-08-02 NOTE — Progress Notes (Signed)
  [facility]   HEMATOLOGY- ONCOLOGY TeleHEALTH OFFICE VISIT PROGRESS NOTE  I connected with Kristine Garbe on 08/02/22 at  9:40 AM EDT by telephone and verified that I am speaking with the correct person using two identifiers.   I discussed the limitations, risks, security and privacy concerns of performing an evaluation and management service by telemedicine and the availability of in-person appointments. I also discussed with the patient that there may be a patient responsible charge related to this service. The patient expressed understanding and agreed to proceed.  Patient's location: Home Provider's location: Office   Diagnosis: DCIS  INTERVAL HISTORY:   Ms. Winschel began letrozole in August 2023.  No arthralgias.  She reports developing "night sweats "a few months ago.  The night sweats improved when she stopped letrozole.  She would like to continue antiestrogen, but is concerned the letrozole because the night sweats.  No change over the breast.  She has "neuropathy "symptoms in the ankles and feet.  She was dizzy when taking her blood pressure medication during the day and now takes these medications at night.  Lab Results:  Lab Results  Component Value Date   WBC 8.4 02/03/2022   HGB 14.4 02/03/2022   HCT 41.9 02/03/2022   MCV 81.2 02/03/2022   PLT 331 02/03/2022   NEUTROABS 4.9 02/03/2022   Medications: I have reviewed the patient's current medications.  Assessment/Plan: Left breast DCIS Screening mammogram 11/04/2021-calcifications in the central left breast Diagnostic left mammogram 719 2023-1.3 cm group of calcifications in the left breast suspicious for malignancy Stereotactic biopsy 11/16/2021-DCIS, intermediate nuclear grade, 0.6 cm, necrosis present, calcifications present, ER 100%, PR 70% Left lumpectomy 12/07/2021-small residual focus of DCIS in vicinity of biopsy site, negative margins.  All margins negative for DCIS by at least 10 mm Letrozole  12/20/2021 Letrozole discontinued secondary to "night sweats "and anastrozole started 08/02/2022 Gastroesophageal reflux Chronic nonproductive cough C. difficile January 2023 Family history of breast cancer-negative genetic testing    Disposition: Ms. Mcleary has a history of DCIS.  She began adjuvant letrozole in August 2023.  She has developed night sweats for the past few months.  It is possible the night sweats are related to letrozole or another etiology.  We discussed the risk/benefit of resuming antiestrogen therapy.  She would like to continue adjuvant therapy as opposed to observation.  Letrozole will be discontinued.  She will begin a trial of anastrozole.  She will contact us if the night sweats persist.  It is possible the night sweats are related to taking blood pressure medications in the evening.  Ms. Klemme will return as scheduled on 09/20/2022.  I discussed the assessment and treatment plan with the patient. The patient was provided an opportunity to ask questions and all were answered. The patient agreed with the plan and demonstrated an understanding of the instructions.   The patient was advised to call back or seek an in-person evaluation if the symptoms worsen or if the condition fails to improve as anticipated.  I provided 20 minutes of phone, chart review, and documentation time during this encounter, and > 50% was spent counseling as documented under my assessment & plan.  Thornton Papas MD  08/02/2022 9:52 AM

## 2022-08-05 ENCOUNTER — Ambulatory Visit (HOSPITAL_BASED_OUTPATIENT_CLINIC_OR_DEPARTMENT_OTHER)
Admission: RE | Admit: 2022-08-05 | Discharge: 2022-08-05 | Disposition: A | Discharge status: Home / Self Care | Payer: PPO | Source: Ambulatory Visit | Attending: Internal Medicine | Admitting: Internal Medicine

## 2022-08-05 DIAGNOSIS — I10 Essential (primary) hypertension: Secondary | ICD-10-CM

## 2022-08-05 DIAGNOSIS — R42 Dizziness and giddiness: Secondary | ICD-10-CM | POA: Diagnosis not present

## 2022-08-05 DIAGNOSIS — G319 Degenerative disease of nervous system, unspecified: Secondary | ICD-10-CM | POA: Diagnosis not present

## 2022-08-05 MED ORDER — GADOBUTROL 1 MMOL/ML IV SOLN
5.0000 mL | Freq: Once | INTRAVENOUS | Status: AC | PRN
  Administered 2022-08-05: 5 mL via INTRAVENOUS
  Filled 2022-08-05: qty 6

## 2022-08-09 ENCOUNTER — Encounter (HOSPITAL_BASED_OUTPATIENT_CLINIC_OR_DEPARTMENT_OTHER): Payer: Self-pay | Admitting: Family

## 2022-08-09 ENCOUNTER — Ambulatory Visit (HOSPITAL_BASED_OUTPATIENT_CLINIC_OR_DEPARTMENT_OTHER): Payer: PPO | Admitting: Family

## 2022-08-09 ENCOUNTER — Other Ambulatory Visit (HOSPITAL_BASED_OUTPATIENT_CLINIC_OR_DEPARTMENT_OTHER): Payer: Self-pay

## 2022-08-09 VITALS — BP 132/73 | HR 96 | Ht 62.0 in | Wt 125.0 lb

## 2022-08-09 DIAGNOSIS — I10 Essential (primary) hypertension: Secondary | ICD-10-CM

## 2022-08-09 DIAGNOSIS — R42 Dizziness and giddiness: Secondary | ICD-10-CM | POA: Diagnosis not present

## 2022-08-09 MED ORDER — LOSARTAN POTASSIUM 50 MG PO TABS
ORAL_TABLET | ORAL | 3 refills | Status: DC
  Filled 2022-08-09: qty 180, fill #0
  Filled 2022-08-22: qty 180, 120d supply, fill #0
  Filled 2022-09-07: qty 150, 100d supply, fill #0
  Filled 2022-09-08: qty 45, 30d supply, fill #0
  Filled 2022-09-26 – 2022-10-01 (×3): qty 45, 30d supply, fill #1

## 2022-08-09 NOTE — Patient Instructions (Signed)
Medication Instructions:  Your physician has recommended you make the following change in your medication:   Change: Losartan 1/2 tablet in the am and one whole tablet in the morning.   Follow-Up: At [facility], you and your health needs are our priority.  As part of our continuing mission to provide you with exceptional heart care, we have created designated Provider Care Teams.  These Care Teams include your primary Cardiologist (physician) and Advanced Practice Providers (APPs -  Physician Assistants and Nurse Practitioners) who all work together to provide you with the care you need, when you need it.  We recommend signing up for the patient portal called "MyChart".  Sign up information is provided on this After Visit Summary.  MyChart is used to connect with patients for Virtual Visits (Telemedicine).  Patients are able to view lab/test results, encounter notes, upcoming appointments, etc.  Non-urgent messages can be sent to your provider as well.   To learn more about what you can do with MyChart, go to [email or web address].    Your next appointment:   3-4 month(s)  Provider:   Jodelle Red, MD

## 2022-08-09 NOTE — Progress Notes (Signed)
Office Visit    Patient Name: Tammy Lindsey Date of Encounter: 08/09/2022  PCP:  Mahlon Gammon, MD   [facility]  Cardiologist:  Jodelle Red, MD  Advanced Practice Provider:  No care team member to display Electrophysiologist:  None      Chief Complaint    Tammy Lindsey is a 89 y.o. female presents today for hypertension   Past Medical History    Past Medical History:  Diagnosis Date   Breast cancer 10/2021   left breast DCIS   Diverticulosis    Esophageal reflux    Family history of breast cancer 11/25/2021   Hiatal hernia    Hx of colonic polyps    Hypertension    Per new patient packet   Mild asthma    Per new patient packet   Osteoarthritis    Osteopenia    bone density 1-09 and 2-13   Pulmonary nodules    scattered-CT Scan July 2010, 12-10, and 04-2010-all stable felt benign    Past Surgical History:  Procedure Laterality Date   BREAST LUMPECTOMY WITH RADIOACTIVE SEED LOCALIZATION Left 12/07/2021   Procedure: LEFT BREAST LUMPECTOMY WITH RADIOACTIVE SEED LOCALIZATION;  Surgeon: Griselda Miner, MD;  Location: [facility];  Service: General;  Laterality: Left;   CHOLECYSTECTOMY  04/25/1972   Gall Bladder; [facility]   DG PORTABLE CHEST X RAY ([facility] HX)  2023   Shoulder   DIAGNOSTIC MAMMOGRAM  2022   [facility]; Per new patient packet   JOINT REPLACEMENT Left 08/24/2010   JOINT REPLACEMENT Left 04/25/2010   Reverse    orthroscopic  Rotator Cuff Left 04/25/2009   Partial shoulder Left 04/26/2003   ROTATOR CUFF REPAIR     x 2 1998 and 1999   SPINE SURGERY  04/26/1983   disk   TONSILLECTOMY  04/25/1950   Dr. Elonda Husky; Per new patient packet    Allergies  Allergies  Allergen Reactions   Sulfa Antibiotics     History of Present Illness    Tammy Lindsey is a 89 y.o. female with a hx of hypertension, lightheadedness, DCIS of left breast, lumbar radiculopathy, degeneration of lumbar intervertebral disc last  seen 07/19/2022.  Seen by Dr. Cristal Deer 05/26/2022 in consult for labile blood pressure with lightheadedness.  Hydralazine was reduced to as needed.  She was retrialed on low-dose amlodipine 2.5 mg daily.  At last office visit 07/19/2022 with Perlie Gold, PA due to fatigue and feeling "foggy headed" Atenolol was reduced to  QHS. Amlodipine increased to . Hydralazine continued PRN.   Presents today for follow-up independently.  She had brain MRI due to persistent lightheadedness and neck discomfort with results pending.  Has no treatment Lidopin to the evening.  BP at home overall average 128/75. Tells me she gets a little dizzy sometimes if she turns quickly or changes positions quickly.  No near-syncope, syncope. Usually eats 3 meals per day. Drinks coffee in the morning and then tea with meals and water during the day. Notes that her dizziness is most often after she takes her Losartan in the morning. She does take with food.  She feels her energy level is improved since reducing dose of atenolol.  Previous antihypertensives Hydralazine -lightheaded on 3 times daily dosing  EKGs/Labs/Other Studies Reviewed:   The following studies were reviewed today:      EKG:  EKG is not ordered today.    Recent Labs: 11/24/2021: ALT 11 02/03/2022: BUN 8; Creatinine,  Ser 0.60; Hemoglobin 14.4; Platelets 331; Potassium 4.2; Sodium 130 06/08/2022: TSH 0.798  Recent Lipid Panel    Component Value Date/Time   CHOL 152 11/18/2021 0000   TRIG 136 11/18/2021 0000   HDL 55 11/18/2021 0000   LDLCALC 70 11/18/2021 0000     Home Medications   Current Meds  Medication Sig   acetaminophen (TYLENOL) 500 MG tablet Take 500 mg by mouth as needed.   amLODipine (NORVASC) 5 MG tablet Take 1 tablet (5 mg total) by mouth daily with breakfast.   anastrozole (ARIMIDEX) 1 MG tablet Take 1 tablet (1 mg total) by mouth daily.   atenolol (TENORMIN) 25 MG tablet Take 1 tablet (25 mg total) by mouth at bedtime.    Cholecalciferol (D3 PO) Take 250 mcg by mouth daily.   cholestyramine (QUESTRAN) 4 g packet Take 1 packet mixed with water or non-carbonated drink by mouth once daily.   Cyanocobalamin (B-12) 1000 MCG TABS Take by mouth daily.   diclofenac Sodium (VOLTAREN) 1 % GEL Apply topically as needed.   hydrALAZINE (APRESOLINE) 10 MG tablet Take 1 tablet (10 mg total) by mouth 3 (three) times daily as needed (for top number of the blood pressure over 160).   HYDROcodone bit-homatropine (HYCODAN) 5-1.5 MG/5ML syrup Take 5 mLs by mouth at bedtime as needed for cough.   lidocaine (SALONPAS PAIN RELIEVING) 4 % Place onto the skin as needed.   mirtazapine (REMERON) 15 MG tablet Take 0.5 tablets (7.5 mg total) by mouth at bedtime as needed for sleep.   pantoprazole (PROTONIX) 40 MG tablet Take 1 tablet (40 mg total) by mouth daily.   Tdap (BOOSTRIX) 5-2.5-18.5 LF-MCG/0.5 injection Inject into the muscle.   [DISCONTINUED] losartan (COZAAR) 50 MG tablet Take 1 tablet (50 mg total) by mouth 2 (two) times daily.     Review of Systems      All other systems reviewed and are otherwise negative except as noted above.  Physical Exam    VS:  BP 132/73   Pulse 96   Ht  (1.575 m)   Wt 125 lb (56.7 kg)   BMI 22.86 kg/m  , BMI Body mass index is 22.86 kg/m.  Wt Readings from Last 3 Encounters:  08/09/22 125 lb (56.7 kg)  07/19/22 125 lb 1.6 oz (56.7 kg)  06/21/22 124 lb (56.2 kg)     GEN: Well nourished, well developed, in no acute distress. HEENT: normal. Neck: Supple, no JVD, carotid bruits, or masses. Cardiac: RRR, no murmurs, rubs, or gallops. No clubbing, cyanosis, edema.  Radials/PT 2+ and equal bilaterally.  Respiratory:  Respirations regular and unlabored, clear to auscultation bilaterally. GI: Soft, nontender, nondistended. MS: No deformity or atrophy. Skin: Warm and dry, no rash. Neuro:  Strength and sensation are intact. Psych: Normal affect.  Assessment & Plan    Hypertension-BP  very mildly elevated in clinic but home average 128/75.  She does have hypotensive readings with systolic in the 110s at home.  Will adjust losartan to 25 mg a.m. and 50 mg p.m.  Continue amlodipine 5 mg every evening, atenolol 25 mg every evening.  She feels her energy level is improved reduced dose of atenolol and does not wish to further decrease at this time.  Lightheadedness-due to episodes of lightheadedness predominantly morning after losartan will reduce morning dose to half tablet ( ) and continue 50 mg tablet in the evening.  Likely some ideology of orthostasis and encouraged slow position changes, staying adequately hydrated, regular meals.  She had brain MRI as ordered by primary care and awaiting results from radiology.         Disposition: Follow up in 3 month(s) with Jodelle Red, MD or APP.  Signed, Alver Sorrow, NP 08/09/2022, 1:20 PM [facility]

## 2022-08-22 ENCOUNTER — Other Ambulatory Visit (HOSPITAL_BASED_OUTPATIENT_CLINIC_OR_DEPARTMENT_OTHER): Payer: Self-pay

## 2022-08-22 ENCOUNTER — Other Ambulatory Visit: Payer: Self-pay

## 2022-08-22 MED ORDER — CHOLESTYRAMINE 4 G PO PACK
4.0000 g | PACK | Freq: Every day | ORAL | 0 refills | Status: DC
  Filled 2022-08-22: qty 90, 90d supply, fill #0

## 2022-08-23 ENCOUNTER — Other Ambulatory Visit (HOSPITAL_BASED_OUTPATIENT_CLINIC_OR_DEPARTMENT_OTHER): Payer: Self-pay

## 2022-08-24 ENCOUNTER — Other Ambulatory Visit (HOSPITAL_BASED_OUTPATIENT_CLINIC_OR_DEPARTMENT_OTHER): Payer: Self-pay

## 2022-08-24 DIAGNOSIS — I10 Essential (primary) hypertension: Secondary | ICD-10-CM

## 2022-08-24 MED ORDER — AMLODIPINE BESYLATE 5 MG PO TABS
5.0000 mg | ORAL_TABLET | Freq: Every day | ORAL | 3 refills | Status: DC
  Filled 2022-08-24 – 2022-08-27 (×2): qty 90, 90d supply, fill #0
  Filled 2022-11-21: qty 90, 90d supply, fill #1
  Filled 2023-01-12 – 2023-02-13 (×2): qty 90, 90d supply, fill #2
  Filled 2023-05-21: qty 90, 90d supply, fill #3

## 2022-08-25 ENCOUNTER — Telehealth: Payer: Self-pay

## 2022-08-25 NOTE — Telephone Encounter (Signed)
Attempted to call the patient about her AWV she will not be able to get the AWV until 09/04/2022

## 2022-08-27 ENCOUNTER — Other Ambulatory Visit (HOSPITAL_BASED_OUTPATIENT_CLINIC_OR_DEPARTMENT_OTHER): Payer: Self-pay

## 2022-08-29 ENCOUNTER — Non-Acute Institutional Stay: Payer: PPO | Admitting: Adult Health

## 2022-08-29 ENCOUNTER — Encounter: Payer: Self-pay | Admitting: Adult Health

## 2022-08-29 ENCOUNTER — Other Ambulatory Visit (HOSPITAL_BASED_OUTPATIENT_CLINIC_OR_DEPARTMENT_OTHER): Payer: Self-pay

## 2022-08-29 VITALS — BP 150/70 | HR 72 | Temp 98.0°F | Resp 17 | Ht 62.0 in | Wt 123.6 lb

## 2022-08-29 DIAGNOSIS — I10 Essential (primary) hypertension: Secondary | ICD-10-CM | POA: Diagnosis not present

## 2022-08-29 DIAGNOSIS — R053 Chronic cough: Secondary | ICD-10-CM

## 2022-08-29 DIAGNOSIS — G629 Polyneuropathy, unspecified: Secondary | ICD-10-CM | POA: Diagnosis not present

## 2022-08-29 DIAGNOSIS — H90A22 Sensorineural hearing loss, unilateral, left ear, with restricted hearing on the contralateral side: Secondary | ICD-10-CM | POA: Diagnosis not present

## 2022-08-29 DIAGNOSIS — H90A31 Mixed conductive and sensorineural hearing loss, unilateral, right ear with restricted hearing on the contralateral side: Secondary | ICD-10-CM | POA: Diagnosis not present

## 2022-08-29 MED ORDER — HYDROCODONE BIT-HOMATROP MBR 5-1.5 MG/5ML PO SOLN
5.0000 mL | Freq: Every evening | ORAL | 0 refills | Status: DC | PRN
  Filled 2022-08-29: qty 240, 48d supply, fill #0

## 2022-08-29 NOTE — Progress Notes (Signed)
Location:  [facility]  Provider:  Peggye Ley, ANP [facility] [phone]   Goals of Care:     08/29/2022    1:05 PM  Advanced Directives  Type of Advance Directive Healthcare Power of [place];Living will;Out of facility DNR (pink MOST or yellow form)  Does patient want to make changes to medical advance directive? No - Patient declined     Chief Complaint  Patient presents with  . Acute Visit    Patient states she will like to discuss BP    HPI: Patient is a 89 y.o. female seen today for HTN  Has taken hydralazine twice in the past two weeks for elevated bp  Can become dizzy when standing after taking meds, not passing out.  No sob or cp Reports mild swelling the feet.  She has her bp readings with her 113/58 128/59 106/66  Reports some numbness/achy feeling to lymph nodes at neck.  Has a chronic runny nose no symptoms of fever or chills Has chronic dry cough, given hydrocodone syrup by Dr Judeth Horn and needs refill  Reports numbness to both feet. NO pain. Taking B12. Labs checked and eval done at Dr Trevor Mace office. No cause identified. NO further treatment indicated. Seen by Dr Ethelene Hal for lumbar radiculitis had prior injection.   Past Medical History:  Diagnosis Date  . 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  . Diverticulosis   . Esophageal reflux   . Family history of breast cancer 11/25/2021  . Hiatal hernia   . Hx of colonic polyps   . Hypertension    Per new patient packet  . Mild asthma    Per new patient packet  . Osteoarthritis   . Osteopenia    bone density 1-09 and 2-13  . Pulmonary nodules    scattered-CT Scan July 2010, 12-10, and 04-2010-all stable felt benign     Past Surgical History:  Procedure Laterality Date  . BREAST LUMPECTOMY WITH RADIOACTIVE SEED LOCALIZATION Left 12/07/2021   Procedure: LEFT BREAST LUMPECTOMY WITH RADIOACTIVE SEED LOCALIZATION;  Surgeon: Griselda Miner, MD;  Location: [facility];  Service: General;  Laterality: Left;  . CHOLECYSTECTOMY  04/25/1972   Gall Bladder; [facility]etta Nunnery  . DG PORTABLE CHEST X RAY ([facility] HX)  2023   Shoulder  . DIAGNOSTIC MAMMOGRAM  2022   [facility]; Per new patient packet  . JOINT REPLACEMENT Left 08/24/2010  . JOINT REPLACEMENT Left 04/25/2010   Reverse   . orthroscopic  Rotator Cuff Left 04/25/2009  . Partial shoulder Left 04/26/2003  . ROTATOR CUFF REPAIR     x 2 1998 and 1999  . SPINE SURGERY  04/26/1983   disk  . TONSILLECTOMY  04/25/1950   Dr. Elonda Husky; Per new patient packet    Allergies  Allergen Reactions  . Sulfa Antibiotics     Outpatient Encounter Medications as of 08/29/2022  Medication Sig  . acetaminophen (TYLENOL) 500 MG tablet Take 500 mg by mouth as needed.  Marland Kitchen amLODipine (NORVASC) 5 MG tablet Take 1 tablet (5 mg total) by mouth daily with breakfast.  . anastrozole (ARIMIDEX) 1 MG tablet Take 1 tablet (1 mg total) by mouth daily.  Marland Kitchen atenolol (TENORMIN) 25 MG tablet Take 1 tablet (25 mg total) by mouth at bedtime.  . Cholecalciferol (D3 PO) Take 250 mcg by mouth daily.  . cholestyramine (QUESTRAN) 4 g packet Take 1 packet (4 g total) by mouth daily with water or a non-carbonated drink.  Marland Kitchen  Cyanocobalamin (B-12) 1000 MCG TABS Take by mouth daily.  . diclofenac Sodium (VOLTAREN) 1 % GEL Apply topically as needed.  . hydrALAZINE (APRESOLINE) 10 MG tablet Take 1 tablet (10 mg total) by mouth 3 (three) times daily as needed (for top number of the blood pressure over 160).  Marland Kitchen HYDROcodone bit-homatropine (HYCODAN) 5-1.5 MG/5ML syrup Take 5 mLs by mouth at bedtime as needed for cough.  . lidocaine (SALONPAS PAIN RELIEVING) 4 % Place onto the skin as needed.  Marland Kitchen losartan (COZAAR) 50 MG tablet Take a half tablet in the morning and a whole tablet in the evening  . mirtazapine (REMERON) 15 MG tablet Take 0.5 tablets (7.5 mg total) by mouth at bedtime as needed for sleep.  . pantoprazole (PROTONIX) 40 MG tablet Take 1 tablet (40  mg total) by mouth daily.  . Tdap (BOOSTRIX) 5-2.5-18.5 LF-MCG/0.5 injection Inject into the muscle.   No facility-administered encounter medications on file as of 08/29/2022.    Review of Systems:  Review of Systems  Constitutional:  Negative for activity change, appetite change, chills, diaphoresis, fatigue, fever and unexpected weight change.  HENT:  Positive for rhinorrhea. Negative for congestion.   Respiratory:  Positive for cough. Negative for shortness of breath and wheezing.   Cardiovascular:  Positive for leg swelling. Negative for chest pain and palpitations.  Gastrointestinal:  Negative for abdominal distention, abdominal pain, constipation and diarrhea.  Genitourinary:  Negative for difficulty urinating and dysuria.  Musculoskeletal:  Negative for arthralgias, back pain, gait problem, joint swelling and myalgias.  Neurological:  Negative for dizziness, tremors, seizures, syncope, facial asymmetry, speech difficulty, weakness, light-headedness, numbness and headaches.  Psychiatric/Behavioral:  Negative for agitation, behavioral problems and confusion.     Health Maintenance  Topic Date Due  . Medicare Annual Wellness (AWV)  09/03/2022  . COVID-19 Vaccine (5 - 2023-24 season) 02/15/2023 (Originally 03/24/2022)  . INFLUENZA VACCINE  11/24/2022  . DTaP/Tdap/Td (2 - Td or Tdap) 06/13/2032  . Pneumonia Vaccine 22+ Years old  Completed  . DEXA SCAN  Completed  . Zoster Vaccines- Shingrix  Completed  . HPV VACCINES  Aged Out    Physical Exam: Vitals:   08/29/22 1305 08/29/22 1310  BP: (!) 158/72 (!) 150/70  Pulse: 72   Resp: 17   Temp: 98 F (36.7 C)   TempSrc: Temporal   SpO2: 93%   Weight: 123 lb 9.6 oz (56.1 kg)   Height: 5\' 2"  (1.575 m)    Body mass index is 22.61 kg/m. Physical Exam Vitals and nursing note reviewed.  Constitutional:      General: She is not in acute distress.    Appearance: She is not diaphoretic.  HENT:     Head: Normocephalic and  atraumatic.     Right Ear: Tympanic membrane normal.     Left Ear: Tympanic membrane normal.     Nose: Nose normal.     Mouth/Throat:     Mouth: Mucous membranes are moist.     Pharynx: Oropharynx is clear.  Eyes:     Conjunctiva/sclera: Conjunctivae normal.     Pupils: Pupils are equal, round, and reactive to light.  Neck:     Vascular: No JVD.  Cardiovascular:     Rate and Rhythm: Normal rate and regular rhythm.     Heart sounds: No murmur heard. Pulmonary:     Effort: Pulmonary effort is normal. No respiratory distress.     Breath sounds: Normal breath sounds. No wheezing.  Musculoskeletal:  Cervical back: No rigidity or tenderness.     Comments: Trace edema to both feet  Lymphadenopathy:     Cervical: No cervical adenopathy.  Skin:    General: Skin is warm and dry.  Neurological:     Mental Status: She is alert and oriented to person, place, and time.    Labs reviewed: Basic Metabolic Panel: Recent Labs    11/18/21 0000 11/24/21 0818 02/03/22 0205 06/08/22 1231  NA 134* 133* 130*  --   K 4.5 4.3 4.2  --   CL 97* 99 95*  --   CO2 24* 26 27  --   GLUCOSE  --  90 104*  --   BUN 13 14 8   --   CREATININE 0.6 0.68 0.60  --   CALCIUM 9.1 9.1 9.7  --   TSH 1.41  --   --  0.798   Liver Function Tests: Recent Labs    11/18/21 0000 11/24/21 0818 06/08/22 1224  AST 14 12*  --   ALT 18 11  --   ALKPHOS 67 54  --   BILITOT  --  0.5  --   PROT  --  6.9 6.4  ALBUMIN  --  4.4  --    No results for input(s): "LIPASE", "AMYLASE" in the last 8760 hours. No results for input(s): "AMMONIA" in the last 8760 hours. CBC: Recent Labs    11/18/21 0000 11/24/21 0818 02/03/22 0205  WBC 7.5 6.4 8.4  NEUTROABS  --  4.4 4.9  HGB 13.7 13.7 14.4  HCT  --  39.3 41.9  MCV  --  82.2 81.2  PLT 328 300 331   Lipid Panel: Recent Labs    11/18/21 0000  CHOL 152  HDL 55  LDLCALC 70  TRIG 136   Lab Results  Component Value Date   HGBA1C 5.5 06/08/2022     Procedures since last visit: MR Brain W Wo Contrast  Result Date: 08/09/2022 CLINICAL DATA:  Syncope/presyncope, cerebrovascular cause suspected. Dizziness. History of breast cancer. EXAM: MRI HEAD WITHOUT AND WITH CONTRAST TECHNIQUE: Multiplanar, multiecho pulse sequences of the brain and surrounding structures were obtained without and with intravenous contrast. CONTRAST:  5mL GADAVIST GADOBUTROL 1 MMOL/ML IV SOLN COMPARISON:  Head CT 01/14/2021 and MRI 07/10/2015 FINDINGS: Brain: There is no evidence of an acute infarct, intracranial hemorrhage, mass, midline shift, or extra-axial fluid collection. Mild cerebral atrophy is within normal limits for age. Scattered punctate T2 hyperintensities in the cerebral white matter also within normal limits for age. A subcentimeter CSF signal intensity cystic focus in the subcortical right temporal lobe appears smaller than on the prior MRI and is considered benign. 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 No abnormal enhancement is identified. Vascular: Major intracranial vascular flow voids are preserved. Skull and upper cervical spine: Hyperostosis frontalis interna. Chronic T1 hypointense, mildly enhancing focus in the right frontal skull which currently measures 2.4 cm with corresponding mild sclerosis on CT, mildly enlarged from 2016 compatible with an indolent/nonaggressive lesion. Sinuses/Orbits: Bilateral cataract extraction. Paranasal sinuses and mastoid air cells are clear. Other: None. IMPRESSION: Unchanged and largely unremarkable appearance of the brain for age with a few chronic benign findings as detailed above. No acute abnormality. Electronically Signed   By: Sebastian Ache M.D.   On: 08/09/2022 16:43    Assessment/Plan There are no diagnoses linked to this encounter.   Labs/tests ordered:  * No order type specified *  Next appt:  09/12/2022

## 2022-09-06 ENCOUNTER — Encounter (HOSPITAL_BASED_OUTPATIENT_CLINIC_OR_DEPARTMENT_OTHER): Payer: Self-pay

## 2022-09-06 NOTE — Telephone Encounter (Signed)
Please review and advise.

## 2022-09-07 ENCOUNTER — Encounter (HOSPITAL_BASED_OUTPATIENT_CLINIC_OR_DEPARTMENT_OTHER): Payer: Self-pay

## 2022-09-07 ENCOUNTER — Other Ambulatory Visit (HOSPITAL_BASED_OUTPATIENT_CLINIC_OR_DEPARTMENT_OTHER): Payer: Self-pay

## 2022-09-08 ENCOUNTER — Other Ambulatory Visit (HOSPITAL_BASED_OUTPATIENT_CLINIC_OR_DEPARTMENT_OTHER): Payer: Self-pay

## 2022-09-09 ENCOUNTER — Other Ambulatory Visit: Payer: Self-pay

## 2022-09-09 ENCOUNTER — Encounter: Payer: Self-pay | Admitting: Internal Medicine

## 2022-09-11 ENCOUNTER — Other Ambulatory Visit (HOSPITAL_BASED_OUTPATIENT_CLINIC_OR_DEPARTMENT_OTHER): Payer: Self-pay

## 2022-09-12 ENCOUNTER — Encounter: Payer: PPO | Admitting: Adult Health

## 2022-09-12 ENCOUNTER — Other Ambulatory Visit: Payer: Self-pay | Admitting: Internal Medicine

## 2022-09-12 ENCOUNTER — Other Ambulatory Visit: Payer: Self-pay

## 2022-09-12 ENCOUNTER — Other Ambulatory Visit (HOSPITAL_BASED_OUTPATIENT_CLINIC_OR_DEPARTMENT_OTHER): Payer: Self-pay

## 2022-09-12 MED ORDER — MIRTAZAPINE 15 MG PO TABS
7.5000 mg | ORAL_TABLET | Freq: Every evening | ORAL | 4 refills | Status: DC | PRN
  Filled 2022-09-12: qty 45, 90d supply, fill #0
  Filled 2022-12-11: qty 45, 90d supply, fill #1
  Filled 2023-02-13 – 2023-02-19 (×2): qty 45, 90d supply, fill #2
  Filled 2023-05-07: qty 45, 90d supply, fill #3
  Filled 2023-05-09 – 2023-08-03 (×2): qty 45, 90d supply, fill #4

## 2022-09-18 ENCOUNTER — Encounter: Payer: Self-pay | Admitting: Internal Medicine

## 2022-09-20 ENCOUNTER — Inpatient Hospital Stay: Payer: PPO | Attending: Oncology | Admitting: Oncology

## 2022-09-20 ENCOUNTER — Encounter: Payer: Self-pay | Admitting: Oncology

## 2022-09-20 VITALS — BP 130/59 | HR 67 | Temp 98.7°F | Resp 18 | Ht 62.0 in | Wt 121.8 lb

## 2022-09-20 DIAGNOSIS — D0512 Intraductal carcinoma in situ of left breast: Secondary | ICD-10-CM | POA: Insufficient documentation

## 2022-09-20 DIAGNOSIS — K219 Gastro-esophageal reflux disease without esophagitis: Secondary | ICD-10-CM | POA: Insufficient documentation

## 2022-09-20 NOTE — Progress Notes (Signed)
  [facility] OFFICE PROGRESS NOTE   Diagnosis: DCIS  INTERVAL HISTORY:   Tammy Lindsey returns as scheduled.  She began a trial of anastrozole following a telephone visit 08/02/2022.  Letrozole was discontinued due to night sweats.  The sweats are less, now may be occurring 2 times per week involving the chest and back.  No significant arthralgias.  She has an intermittent numb sensation from the ankles to the toes, worse at nighttime.  For the past few weeks she has noted "numbness" in her neck.  Objective:  Vital signs in last 24 hours:  Blood pressure (!) 130/59, pulse 67, temperature 98.7 F (37.1 C), temperature source Oral, resp. rate 18, height 5\' 2"  (1.575 m), weight 121 lb 12.8 oz (55.2 kg), SpO2 100 %.    Lymphatics: No palpable cervical, supraclavicular or axillary lymph nodes.   Resp: Lungs clear bilaterally. Cardio: Regular rate and rhythm. GI: No hepatosplenomegaly. Vascular: No leg edema. Breast: Status post left lumpectomy.  No mass in either breast.   Lab Results:  Lab Results  Component Value Date   WBC 8.4 02/03/2022   HGB 14.4 02/03/2022   HCT 41.9 02/03/2022   MCV 81.2 02/03/2022   PLT 331 02/03/2022   NEUTROABS 4.9 02/03/2022    Imaging:  No results found.  Medications: I have reviewed the patient's current medications.  Assessment/Plan: Left breast DCIS Screening mammogram 11/04/2021-calcifications in the central left breast Diagnostic left mammogram 719 2023-1.3 cm group of calcifications in the left breast suspicious for malignancy Stereotactic biopsy 11/16/2021-DCIS, intermediate nuclear grade, 0.6 cm, necrosis present, calcifications present, ER 100%, PR 70% Left lumpectomy 12/07/2021-small residual focus of DCIS in vicinity of biopsy site, negative margins.  All margins negative for DCIS by at least 10 mm Letrozole 12/20/2021 Anastrozole 08/02/2022 Gastroesophageal reflux Chronic nonproductive cough C. 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  She began adjuvant letrozole in August 2023.  She developed night sweats.  She was changed to anastrozole 08/02/2022.  The sweats are better though she continues to experience a few times a week.  She agrees to continue anastrozole.  She will return for follow-up in approximately 8 months.  We are available to see her sooner if needed.  Patient seen with Dr. Truett Perna.    Lonna Cobb ANP/GNP-BC   09/20/2022  10:32 AM This was a shared visit with Lonna Cobb.  Tammy Lindsey is having fewer hot flashes since beginning anastrozole.  The plan is to continue anastrozole.  Mancel Bale, MD

## 2022-09-20 NOTE — Telephone Encounter (Signed)
Message routed to PCP Gupta, Anjali L, MD  

## 2022-09-23 ENCOUNTER — Other Ambulatory Visit (HOSPITAL_COMMUNITY): Payer: Self-pay

## 2022-09-26 ENCOUNTER — Other Ambulatory Visit: Payer: Self-pay | Admitting: Oncology

## 2022-09-27 ENCOUNTER — Other Ambulatory Visit (HOSPITAL_BASED_OUTPATIENT_CLINIC_OR_DEPARTMENT_OTHER): Payer: Self-pay

## 2022-09-27 ENCOUNTER — Non-Acute Institutional Stay: Payer: PPO | Admitting: Internal Medicine

## 2022-09-27 ENCOUNTER — Encounter: Payer: Self-pay | Admitting: Internal Medicine

## 2022-09-27 VITALS — BP 126/70 | HR 69 | Temp 98.1°F | Resp 16 | Ht 62.0 in | Wt 125.2 lb

## 2022-09-27 DIAGNOSIS — G629 Polyneuropathy, unspecified: Secondary | ICD-10-CM

## 2022-09-27 DIAGNOSIS — R053 Chronic cough: Secondary | ICD-10-CM | POA: Diagnosis not present

## 2022-09-27 DIAGNOSIS — R42 Dizziness and giddiness: Secondary | ICD-10-CM | POA: Diagnosis not present

## 2022-09-27 DIAGNOSIS — R200 Anesthesia of skin: Secondary | ICD-10-CM | POA: Diagnosis not present

## 2022-09-27 DIAGNOSIS — I10 Essential (primary) hypertension: Secondary | ICD-10-CM

## 2022-09-27 DIAGNOSIS — R61 Generalized hyperhidrosis: Secondary | ICD-10-CM

## 2022-09-27 DIAGNOSIS — D0512 Intraductal carcinoma in situ of left breast: Secondary | ICD-10-CM | POA: Diagnosis not present

## 2022-09-27 MED ORDER — ANASTROZOLE 1 MG PO TABS
1.0000 mg | ORAL_TABLET | Freq: Every day | ORAL | 5 refills | Status: DC
  Filled 2022-09-27: qty 30, 30d supply, fill #0
  Filled 2022-10-24: qty 30, 30d supply, fill #1
  Filled 2022-11-21: qty 30, 30d supply, fill #2
  Filled 2022-12-11 – 2022-12-14 (×3): qty 30, 30d supply, fill #3
  Filled 2023-01-26: qty 30, 30d supply, fill #4
  Filled 2023-02-21 (×2): qty 30, 30d supply, fill #5

## 2022-09-27 NOTE — Progress Notes (Signed)
Location:  [facility] features editor of Service:  Clinic (12)  Provider:   Code Status:  Goals of Care:     09/27/2022   10:30 AM  Advanced Directives  Does Patient Have a Medical Advance Directive? Yes  Type of Advance Directive Living will;Healthcare Power of Attorney  Does patient want to make changes to medical advance directive? No - Patient declined  Copy of Healthcare Power of Attorney in Chart? No - copy requested     Chief Complaint  Patient presents with   Medical Management of Chronic Issues    2 month follow up    HPI: Patient is a 89 y.o. female seen today for medical management of chronic diseases.    [place]ives in [place] in [place] for follow up Neck Numbness No Pain  No Fall ? 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% Need follow up Cough Sees Pulmonary Chronic Advair / Helps Uses Hydrocodone syrup sometimes to help    Night sweats On Anastrozole now Says her night sweats are better now    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   Diverticulosis    Esophageal reflux    Family history of breast cancer 11/25/2021   Hiatal hernia    Hx of colonic polyps    Hypertension    Per new patient packet   Mild asthma    Per new patient packet   Osteoarthritis    Osteopenia    bone density 1-09 and 2-13   Pulmonary nodules    scattered-CT Scan July 2010, 12-10, and 04-2010-all stable felt benign     Past Surgical History:  Procedure Laterality Date   BREAST LUMPECTOMY WITH RADIOACTIVE SEED LOCALIZATION Left 12/07/2021   Procedure: LEFT BREAST LUMPECTOMY WITH RADIOACTIVE SEED LOCALIZATION;  Surgeon: Griselda Miner, MD;  Location: [facility];  Service: General;  Laterality: Left;   CHOLECYSTECTOMY  04/25/1972   Gall  Bladder; [facility]   DG PORTABLE CHEST X RAY ([facility] HX)  2023   Shoulder   DIAGNOSTIC MAMMOGRAM  2022   [facility]; Per new patient packet   JOINT REPLACEMENT Left 08/24/2010   JOINT REPLACEMENT Left 04/25/2010   Reverse    orthroscopic  Rotator Cuff Left 04/25/2009   Partial shoulder Left 04/26/2003   ROTATOR CUFF REPAIR     x 2 1998 and 1999   SPINE SURGERY  04/26/1983   disk   TONSILLECTOMY  04/25/1950   Dr. Elonda Husky; Per new patient packet    Allergies  Allergen Reactions   Sulfa Antibiotics     Outpatient Encounter Medications as of 09/27/2022  Medication Sig   acetaminophen (TYLENOL) 500 MG tablet Take 500 mg by mouth as needed.   amLODipine (NORVASC) 5 MG tablet Take 1 tablet (5 mg total) by mouth daily with breakfast.   anastrozole (ARIMIDEX) 1 MG tablet Take 1 tablet (1 mg total) by mouth daily.   atenolol (TENORMIN) 25 MG tablet Take 1 tablet (25 mg total) by mouth at bedtime.   Cholecalciferol (D3 PO) Take 250 mcg by mouth daily.   cholestyramine (QUESTRAN) 4 g packet Take 1 packet (4 g total) by mouth daily with water or a non-carbonated drink.   Cyanocobalamin (B-12) 1000 MCG TABS Take by  mouth daily.   diclofenac Sodium (VOLTAREN) 1 % GEL Apply topically as needed.   HYDROcodone bit-homatropine (HYCODAN) 5-1.5 MG/5ML syrup Take 5 mLs by mouth at bedtime as needed for cough.   lidocaine (SALONPAS PAIN RELIEVING) 4 % Place onto the skin as needed.   losartan (COZAAR) 50 MG tablet Take a half tablet in the morning and a whole tablet in the evening (Patient taking differently: 50 mg 2 (two) times daily.)   mirtazapine (REMERON) 15 MG tablet Take 0.5 tablets (7.5 mg total) by mouth at bedtime as needed for sleep.   pantoprazole (PROTONIX) 40 MG tablet Take 1 tablet (40 mg total) by mouth daily.   [DISCONTINUED] hydrALAZINE (APRESOLINE) 10 MG tablet Take 1 tablet (10 mg total) by mouth 3 (three) times daily as needed (for top number of the blood pressure over 160). (Patient  not taking: Reported on 09/20/2022)   No facility-administered encounter medications on file as of 09/27/2022.    Review of Systems:  Review of Systems  Constitutional:  Negative for activity change and appetite change.  HENT: Negative.    Respiratory:  Positive for cough. Negative for shortness of breath.   Cardiovascular:  Negative for leg swelling.  Gastrointestinal:  Negative for constipation.  Genitourinary: Negative.   Musculoskeletal:  Negative for arthralgias, gait problem and myalgias.  Skin: Negative.   Neurological:  Positive for dizziness. Negative for weakness.  Psychiatric/Behavioral:  Negative for confusion, dysphoric mood and sleep disturbance.     Health Maintenance  Topic Date Due   Medicare Annual Wellness (AWV)  10/27/2022 (Originally 09/03/2022)   COVID-19 Vaccine (5 - 2023-24 season) 02/15/2023 (Originally 03/24/2022)   INFLUENZA VACCINE  11/24/2022   DTaP/Tdap/Td (2 - Td or Tdap) 06/13/2032   Pneumonia Vaccine 84+ Years old  Completed   DEXA SCAN  Completed   Zoster Vaccines- Shingrix  Completed   HPV VACCINES  Aged Out    Physical Exam: Vitals:   09/27/22 1028  BP: 126/70  Pulse: 69  Resp: 16  Temp: 98.1 F (36.7 C)  TempSrc: Temporal  SpO2: 94%  Weight: 125 lb 3.2 oz (56.8 kg)  Height: 5\' 2"  (1.575 m)   Body mass index is 22.9 kg/m. Physical Exam Vitals reviewed.  Constitutional:      Appearance: Normal appearance.  HENT:     Head: Normocephalic.     Nose: Nose normal.     Mouth/Throat:     Mouth: Mucous membranes are moist.     Pharynx: Oropharynx is clear.  Eyes:     Pupils: Pupils are equal, round, and reactive to light.  Neck:     Comments: No thyroid Nodule felt Cardiovascular:     Rate and Rhythm: Normal rate and regular rhythm.     Pulses: Normal pulses.     Heart sounds: Normal heart sounds. No murmur heard. Pulmonary:     Effort: Pulmonary effort is normal.     Breath sounds: Normal breath sounds.  Abdominal:      General: Abdomen is flat. Bowel sounds are normal.     Palpations: Abdomen is soft.  Musculoskeletal:        General: No swelling.     Cervical back: Neck supple. No rigidity or tenderness.  Lymphadenopathy:     Cervical: No cervical adenopathy.  Skin:    General: Skin is warm.  Neurological:     General: No focal deficit present.     Mental Status: She is alert and oriented to person, place, and time.  Psychiatric:        Mood and Affect: Mood normal.        Thought Content: Thought content normal.     Labs reviewed: Basic Metabolic Panel: Recent Labs    11/18/21 0000 11/24/21 0818 02/03/22 0205 06/08/22 1231  NA 134* 133* 130*  --   K 4.5 4.3 4.2  --   CL 97* 99 95*  --   CO2 24* 26 27  --   GLUCOSE  --  90 104*  --   BUN 13 14 8   --   CREATININE 0.6 0.68 0.60  --   CALCIUM 9.1 9.1 9.7  --   TSH 1.41  --   --  0.798   Liver Function Tests: Recent Labs    11/18/21 0000 11/24/21 0818 06/08/22 1224  AST 14 12*  --   ALT 18 11  --   ALKPHOS 67 54  --   BILITOT  --  0.5  --   PROT  --  6.9 6.4  ALBUMIN  --  4.4  --    No results for input(s): "LIPASE", "AMYLASE" in the last 8760 hours. No results for input(s): "AMMONIA" in the last 8760 hours. CBC: Recent Labs    11/18/21 0000 11/24/21 0818 02/03/22 0205  WBC 7.5 6.4 8.4  NEUTROABS  --  4.4 4.9  HGB 13.7 13.7 14.4  HCT  --  39.3 41.9  MCV  --  82.2 81.2  PLT 328 300 331   Lipid Panel: Recent Labs    11/18/21 0000  CHOL 152  HDL 55  LDLCALC 70  TRIG 136   Lab Results  Component Value Date   HGBA1C 5.5 06/08/2022    Procedures since last visit: No results found.  Assessment/Plan 1. Primary hypertension BP good Does fluctuates Reassured patient again  2. Chronic cough Uses Hydrocodone Syrup PRN Does not drive Usually take it at night 3. Neuropathy   4. Dizziness Work up negative Mostly manageable  5. Night sweats Better per patient with change to Anastrozole  6. Ductal  carcinoma in situ (DCIS) of left breast Follows with Oncology  7. Numbness in Neck Reassured Exam normal     Labs/tests ordered:  * No order type specified * Next appt:  11/14/2022

## 2022-09-28 NOTE — Telephone Encounter (Addendum)
Please see the MyChart message reply(ies) for my assessment and plan.    This patient gave consent for this Medical Advice Message and is aware that it may result in a bill to [employer], as well as the possibility of receiving a bill for a co-payment or deductible. They are an established patient, but are not seeking medical advice exclusively about a problem treated during an in person or video visit in the last seven days. I did not recommend an in person or video visit within seven days of my reply.    I spent a total oF 5 minutes cumulative time within 7 days through [employer].   Anson Fret, MD

## 2022-09-29 ENCOUNTER — Other Ambulatory Visit (HOSPITAL_BASED_OUTPATIENT_CLINIC_OR_DEPARTMENT_OTHER): Payer: Self-pay

## 2022-10-01 ENCOUNTER — Other Ambulatory Visit (HOSPITAL_BASED_OUTPATIENT_CLINIC_OR_DEPARTMENT_OTHER): Payer: Self-pay

## 2022-10-02 ENCOUNTER — Encounter: Payer: Self-pay | Admitting: Internal Medicine

## 2022-10-03 ENCOUNTER — Encounter (HOSPITAL_BASED_OUTPATIENT_CLINIC_OR_DEPARTMENT_OTHER): Payer: Self-pay

## 2022-10-04 ENCOUNTER — Other Ambulatory Visit (HOSPITAL_BASED_OUTPATIENT_CLINIC_OR_DEPARTMENT_OTHER): Payer: Self-pay

## 2022-10-04 MED ORDER — OLMESARTAN MEDOXOMIL 40 MG PO TABS
40.0000 mg | ORAL_TABLET | Freq: Every day | ORAL | 6 refills | Status: DC
  Filled 2022-10-04: qty 30, 30d supply, fill #0
  Filled 2022-10-12: qty 30, 30d supply, fill #1

## 2022-10-04 NOTE — Telephone Encounter (Signed)
Which dose of olmesartan would you like to try her on?

## 2022-10-04 NOTE — Telephone Encounter (Signed)
Patient following up.

## 2022-10-04 NOTE — Telephone Encounter (Signed)
Olmesartan 40mg  QD please.   Alver Sorrow, NP

## 2022-10-10 ENCOUNTER — Encounter (HOSPITAL_BASED_OUTPATIENT_CLINIC_OR_DEPARTMENT_OTHER): Payer: Self-pay

## 2022-10-11 DIAGNOSIS — M5416 Radiculopathy, lumbar region: Secondary | ICD-10-CM | POA: Diagnosis not present

## 2022-10-12 ENCOUNTER — Other Ambulatory Visit: Payer: Self-pay

## 2022-10-16 ENCOUNTER — Encounter (HOSPITAL_BASED_OUTPATIENT_CLINIC_OR_DEPARTMENT_OTHER): Payer: Self-pay

## 2022-10-16 ENCOUNTER — Other Ambulatory Visit: Payer: Self-pay | Admitting: Internal Medicine

## 2022-10-17 ENCOUNTER — Other Ambulatory Visit (HOSPITAL_BASED_OUTPATIENT_CLINIC_OR_DEPARTMENT_OTHER): Payer: Self-pay

## 2022-10-17 MED ORDER — OLMESARTAN MEDOXOMIL 40 MG PO TABS: 20.0000 mg | ORAL_TABLET | Freq: Every day | ORAL | 11 refills | Status: DC

## 2022-10-17 MED ORDER — PANTOPRAZOLE SODIUM 40 MG PO TBEC
40.0000 mg | DELAYED_RELEASE_TABLET | Freq: Every day | ORAL | 3 refills | Status: DC
  Filled 2022-10-17: qty 30, 30d supply, fill #0
  Filled 2022-11-16: qty 30, 30d supply, fill #1
  Filled 2022-12-14: qty 30, 30d supply, fill #2
  Filled 2023-01-12: qty 30, 30d supply, fill #3

## 2022-10-17 NOTE — Telephone Encounter (Signed)
Please review and advise.

## 2022-10-17 NOTE — Telephone Encounter (Signed)
Patient sent duplicate message in two encounters, patient responded to in other encounter

## 2022-10-18 ENCOUNTER — Other Ambulatory Visit (HOSPITAL_BASED_OUTPATIENT_CLINIC_OR_DEPARTMENT_OTHER): Payer: Self-pay

## 2022-10-18 MED ORDER — AMOXICILLIN 500 MG PO CAPS
500.0000 mg | ORAL_CAPSULE | Freq: Two times a day (BID) | ORAL | 0 refills | Status: DC
  Filled 2022-10-18: qty 10, 5d supply, fill #0

## 2022-10-19 ENCOUNTER — Ambulatory Visit (HOSPITAL_BASED_OUTPATIENT_CLINIC_OR_DEPARTMENT_OTHER): Payer: PPO | Admitting: *Deleted

## 2022-10-19 VITALS — BP 116/56 | Ht 62.5 in | Wt 123.3 lb

## 2022-10-19 DIAGNOSIS — I10 Essential (primary) hypertension: Secondary | ICD-10-CM

## 2022-10-19 DIAGNOSIS — Z5181 Encounter for therapeutic drug level monitoring: Secondary | ICD-10-CM

## 2022-10-19 NOTE — Progress Notes (Signed)
   Nurse Visit   Date of Encounter: 10/19/2022 ID: Tammy Lindsey, DOB 12-25-1931, MRN [id number]  PCP:  Mahlon Gammon, MD   [facility] Providers Cardiologist:  Jodelle Red, MD      Visit Details   VS:  BP (!) 116/56   Ht 5' 2.5" (1.588 m)   Wt 123 lb 4.8 oz (55.9 kg)   BMI 22.19 kg/m  , BMI Body mass index is 22.19 kg/m.  Wt Readings from Last 3 Encounters:  10/19/22 123 lb 4.8 oz (55.9 kg)  09/27/22 125 lb 3.2 oz (56.8 kg)  09/20/22 121 lb 12.8 oz (55.2 kg)     Reason for visit: BLOOD PRESSURE CHECK   Performed today: Vitals and Provider consulted:Caitlin W NP  Changes (medications, testing, etc.) : No changes made, BMET in a couple of weeks  Length of Visit: 15 minutes    Medications Adjustments/Labs and Tests Ordered: No orders of the defined types were placed in this encounter.  No orders of the defined types were placed in this encounter.  Patient brought her home blood pressure machine into office for review, home machine about 4 points higher than in office. Per patient she feeling better today than she has in a while. Readings reviewed by Ronn Melena NP.  Since the last change of taking the Amlodipine in am and Olmesartan 40 mg 1/2 tablet in evening blood pressure readings are consistently below 140/90 will continue current regimen.   Candace Gallus, LPN  6/96/2952 8:41 PM

## 2022-10-19 NOTE — Patient Instructions (Addendum)
Continue current medications  BMET in couple weeks

## 2022-10-21 ENCOUNTER — Other Ambulatory Visit (HOSPITAL_BASED_OUTPATIENT_CLINIC_OR_DEPARTMENT_OTHER): Payer: Self-pay

## 2022-10-21 MED ORDER — AMOXICILLIN 500 MG PO CAPS
500.0000 mg | ORAL_CAPSULE | Freq: Three times a day (TID) | ORAL | 0 refills | Status: DC
  Filled 2022-10-21 – 2022-10-22 (×3): qty 21, 7d supply, fill #0

## 2022-10-22 ENCOUNTER — Other Ambulatory Visit: Payer: Self-pay

## 2022-10-22 ENCOUNTER — Other Ambulatory Visit (HOSPITAL_BASED_OUTPATIENT_CLINIC_OR_DEPARTMENT_OTHER): Payer: Self-pay

## 2022-10-24 ENCOUNTER — Other Ambulatory Visit (HOSPITAL_BASED_OUTPATIENT_CLINIC_OR_DEPARTMENT_OTHER): Payer: Self-pay

## 2022-11-03 DIAGNOSIS — Z5181 Encounter for therapeutic drug level monitoring: Secondary | ICD-10-CM | POA: Diagnosis not present

## 2022-11-03 DIAGNOSIS — I10 Essential (primary) hypertension: Secondary | ICD-10-CM | POA: Diagnosis not present

## 2022-11-03 LAB — BASIC METABOLIC PANEL
BUN/Creatinine Ratio: 23 (ref 12–28)
BUN: 19 mg/dL (ref 8–27)
CO2: 22 mmol/L (ref 20–29)
Calcium: 9.3 mg/dL (ref 8.7–10.3)
Chloride: 99 mmol/L (ref 96–106)
Creatinine, Ser: 0.84 mg/dL (ref 0.57–1.00)
Glucose: 83 mg/dL (ref 70–99)
Potassium: 5.3 mmol/L — ABNORMAL HIGH (ref 3.5–5.2)
Sodium: 135 mmol/L (ref 134–144)
eGFR: 66 mL/min/{1.73_m2} (ref >59)

## 2022-11-04 ENCOUNTER — Telehealth (HOSPITAL_BASED_OUTPATIENT_CLINIC_OR_DEPARTMENT_OTHER): Payer: Self-pay

## 2022-11-04 DIAGNOSIS — I10 Essential (primary) hypertension: Secondary | ICD-10-CM

## 2022-11-04 DIAGNOSIS — E875 Hyperkalemia: Secondary | ICD-10-CM

## 2022-11-04 NOTE — Telephone Encounter (Addendum)
Results called to patient who verbalizes understanding! Labs ordered for repeat, patient will come Monday or Tuesday.     ----- Message from Alver Sorrow sent at 11/04/2022 12:54 PM EDT ----- Normal kidney function.  Potassium mildly elevated.  Ensure avoiding salt substitutes, potassium supplements, electrolyte drinks.  Sometimes potassium is mildly elevated based on lab error.  Lets have her repeat BMP Monday or Tuesday of next week for monitoring.

## 2022-11-07 DIAGNOSIS — H5213 Myopia, bilateral: Secondary | ICD-10-CM | POA: Diagnosis not present

## 2022-11-07 DIAGNOSIS — Z961 Presence of intraocular lens: Secondary | ICD-10-CM | POA: Diagnosis not present

## 2022-11-08 DIAGNOSIS — E875 Hyperkalemia: Secondary | ICD-10-CM | POA: Diagnosis not present

## 2022-11-09 LAB — BASIC METABOLIC PANEL
BUN/Creatinine Ratio: 18 (ref 12–28)
BUN: 13 mg/dL (ref 8–27)
CO2: 21 mmol/L (ref 20–29)
Calcium: 9.6 mg/dL (ref 8.7–10.3)
Chloride: 101 mmol/L (ref 96–106)
Creatinine, Ser: 0.73 mg/dL (ref 0.57–1.00)
Glucose: 93 mg/dL (ref 70–99)
Potassium: 4.7 mmol/L (ref 3.5–5.2)
Sodium: 137 mmol/L (ref 134–144)
eGFR: 79 mL/min/{1.73_m2} (ref >59)

## 2022-11-14 ENCOUNTER — Non-Acute Institutional Stay (INDEPENDENT_AMBULATORY_CARE_PROVIDER_SITE_OTHER): Payer: PPO | Admitting: Adult Health

## 2022-11-14 ENCOUNTER — Encounter: Payer: Self-pay | Admitting: Adult Health

## 2022-11-14 VITALS — BP 138/72 | HR 65 | Temp 98.3°F | Resp 17 | Ht 62.5 in | Wt 122.8 lb

## 2022-11-14 DIAGNOSIS — Z0000 Encounter for general adult medical examination without abnormal findings: Secondary | ICD-10-CM | POA: Diagnosis not present

## 2022-11-14 DIAGNOSIS — Z853 Personal history of malignant neoplasm of breast: Secondary | ICD-10-CM | POA: Diagnosis not present

## 2022-11-14 DIAGNOSIS — D0512 Intraductal carcinoma in situ of left breast: Secondary | ICD-10-CM | POA: Diagnosis not present

## 2022-11-14 NOTE — Patient Instructions (Signed)
Tammy Lindsey , Thank you for taking time to come for your Medicare Wellness Visit. I appreciate your ongoing commitment to your health goals. Please review the following plan we discussed and let me know if I can assist you in the future.   Screening recommendations/referrals: Colonoscopy aged out Mammogram up to date Bone Density up to date Recommended yearly ophthalmology/optometry visit for glaucoma screening and checkup Recommended yearly dental visit for hygiene and checkup  Vaccinations: Influenza vaccine- due annually in September/October Pneumococcal vaccine up to date Tdap vaccine up to date Shingles vaccine up to date    Advanced directives: reviewed  Conditions/risks identified: NA  Next appointment: 1 year   Preventive Care 87 Years and Older, Female Preventive care refers to lifestyle choices and visits with your health care provider that can promote health and wellness. What does preventive care include? A yearly physical exam. This is also called an annual well check. Dental exams once or twice a year. Routine eye exams. Ask your health care provider how often you should have your eyes checked. Personal lifestyle choices, including: Daily care of your teeth and gums. Regular physical activity. Eating a healthy diet. Avoiding tobacco and drug use. Limiting alcohol use. Practicing safe sex. Taking low-dose aspirin every day. Taking vitamin and mineral supplements as recommended by your health care provider. What happens during an annual well check? The services and screenings done by your health care provider during your annual well check will depend on your age, overall health, lifestyle risk factors, and family history of disease. Counseling  Your health care provider may ask you questions about your: Alcohol use. Tobacco use. Drug use. Emotional well-being. Home and relationship well-being. Sexual activity. Eating habits. History of falls. Memory and  ability to understand (cognition). Work and work Astronomer. Reproductive health. Screening  You may have the following tests or measurements: Height, weight, and BMI. Blood pressure. Lipid and cholesterol levels. These may be checked every 5 years, or more frequently if you are over 87 years old. Skin check. Lung cancer screening. You may have this screening every year starting at age 87 if you have a 30-pack-year history of smoking and currently smoke or have quit within the past 15 years. Fecal occult blood test (FOBT) of the stool. You may have this test every year starting at age 87. Flexible sigmoidoscopy or colonoscopy. You may have a sigmoidoscopy every 5 years or a colonoscopy every 10 years starting at age 87. Hepatitis C blood test. Hepatitis B blood test. Sexually transmitted disease (STD) testing. Diabetes screening. This is done by checking your blood sugar (glucose) after you have not eaten for a while (fasting). You may have this done every 1-3 years. Bone density scan. This is done to screen for osteoporosis. You may have this done starting at age 87. Mammogram. This may be done every 1-2 years. Talk to your health care provider about how often you should have regular mammograms. Talk with your health care provider about your test results, treatment options, and if necessary, the need for more tests. Vaccines  Your health care provider may recommend certain vaccines, such as: Influenza vaccine. This is recommended every year. Tetanus, diphtheria, and acellular pertussis (Tdap, Td) vaccine. You may need a Td booster every 10 years. Zoster vaccine. You may need this after age 23. Pneumococcal 13-valent conjugate (PCV13) vaccine. One dose is recommended after age 87. Pneumococcal polysaccharide (PPSV23) vaccine. One dose is recommended after age 87. Talk to your health care provider about which  screenings and vaccines you need and how often you need them. This information is  not intended to replace advice given to you by your health care provider. Make sure you discuss any questions you have with your health care provider. Document Released: 05/08/2015 Document Revised: 12/30/2015 Document Reviewed: 02/10/2015 Elsevier Interactive Patient Education  2017 [employer].  Fall Prevention in the Home Falls can cause injuries. They can happen to people of all ages. There are many things you can do to make your home safe and to help prevent falls. What can I do on the outside of my home? Regularly fix the edges of walkways and driveways and fix any cracks. Remove anything that might make you trip as you walk through a door, such as a raised step or threshold. Trim any bushes or trees on the path to your home. Use bright outdoor lighting. Clear any walking paths of anything that might make someone trip, such as rocks or tools. Regularly check to see if handrails are loose or broken. Make sure that both sides of any steps have handrails. Any raised decks and porches should have guardrails on the edges. Have any leaves, snow, or ice cleared regularly. Use sand or salt on walking paths during winter. Clean up any spills in your garage right away. This includes oil or grease spills. What can I do in the bathroom? Use night lights. Install grab bars by the toilet and in the tub and shower. Do not use towel bars as grab bars. Use non-skid mats or decals in the tub or shower. If you need to sit down in the shower, use a plastic, non-slip stool. Keep the floor dry. Clean up any water that spills on the floor as soon as it happens. Remove soap buildup in the tub or shower regularly. Attach bath mats securely with double-sided non-slip rug tape. Do not have throw rugs and other things on the floor that can make you trip. What can I do in the bedroom? Use night lights. Make sure that you have a light by your bed that is easy to reach. Do not use any sheets or blankets that  are too big for your bed. They should not hang down onto the floor. Have a firm chair that has side arms. You can use this for support while you get dressed. Do not have throw rugs and other things on the floor that can make you trip. What can I do in the kitchen? Clean up any spills right away. Avoid walking on wet floors. Keep items that you use a lot in easy-to-reach places. If you need to reach something above you, use a strong step stool that has a grab bar. Keep electrical cords out of the way. Do not use floor polish or wax that makes floors slippery. If you must use wax, use non-skid floor wax. Do not have throw rugs and other things on the floor that can make you trip. What can I do with my stairs? Do not leave any items on the stairs. Make sure that there are handrails on both sides of the stairs and use them. Fix handrails that are broken or loose. Make sure that handrails are as long as the stairways. Check any carpeting to make sure that it is firmly attached to the stairs. Fix any carpet that is loose or worn. Avoid having throw rugs at the top or bottom of the stairs. If you do have throw rugs, attach them to the floor with carpet  tape. Make sure that you have a light switch at the top of the stairs and the bottom of the stairs. If you do not have them, ask someone to add them for you. What else can I do to help prevent falls? Wear shoes that: Do not have high heels. Have rubber bottoms. Are comfortable and fit you well. Are closed at the toe. Do not wear sandals. If you use a stepladder: Make sure that it is fully opened. Do not climb a closed stepladder. Make sure that both sides of the stepladder are locked into place. Ask someone to hold it for you, if possible. Clearly mark and make sure that you can see: Any grab bars or handrails. First and last steps. Where the edge of each step is. Use tools that help you move around (mobility aids) if they are needed. These  include: Canes. Walkers. Scooters. Crutches. Turn on the lights when you go into a dark area. Replace any light bulbs as soon as they burn out. Set up your furniture so you have a clear path. Avoid moving your furniture around. If any of your floors are uneven, fix them. If there are any pets around you, be aware of where they are. Review your medicines with your doctor. Some medicines can make you feel dizzy. This can increase your chance of falling. Ask your doctor what other things that you can do to help prevent falls. This information is not intended to replace advice given to you by your health care provider. Make sure you discuss any questions you have with your health care provider. Document Released: 02/05/2009 Document Revised: 09/17/2015 Document Reviewed: 05/16/2014 Elsevier Interactive Patient Education  2017 [employer].

## 2022-11-14 NOTE — Progress Notes (Signed)
Subjective:   Tammy Lindsey is a 89 y.o. female who presents for Medicare Annual (Subsequent) preventive examination.  Visit Complete: In person  Patient Medicare AWV questionnaire was completed by the patient on 11/14/22; I have confirmed that all information answered by patient is correct and no changes since this date.  Review of Systems     Cardiac Risk Factors include: advanced age (>73men, >35 women);hypertension     Objective:    Today's Vitals   11/14/22 1525  BP: 138/72  Pulse: 65  Resp: 17  Temp: 98.3 F (36.8 C)  TempSrc: Temporal  SpO2: 90%  Weight: 122 lb 12.8 oz (55.7 kg)  Height: 5' 2.5" (1.588 m)   Body mass index is 22.1 kg/m.     11/14/2022    3:29 PM 09/27/2022   10:30 AM 09/20/2022   10:29 AM 08/29/2022    1:05 PM 05/17/2022    2:35 PM 05/03/2022    3:02 PM 04/05/2022    2:15 PM  Advanced Directives  Does Patient Have a Medical Advance Directive? Yes Yes Yes  Yes Yes Yes  Type of Advance Directive Living will;Healthcare Power of Attorney;Out of facility DNR (pink MOST or yellow form) Living will;Healthcare Power of [address] Living will Healthcare Power of [place];Living will;Out of facility DNR (pink MOST or yellow form) Healthcare Power of [place];Living will Healthcare Power of [place];Living will Healthcare Power of [place];Living will  Does patient want to make changes to medical advance directive? No - Patient declined No - Patient declined No - Patient declined No - Patient declined No - Patient declined No - Patient declined   Copy of Healthcare Power of Attorney in Chart? No - copy requested No - copy requested   Yes - validated most recent copy scanned in chart (See row information) Yes - validated most recent copy scanned in chart (See row information) Yes - validated most recent copy scanned in chart (See row information)    Current Medications (verified) Outpatient Encounter Medications as of 11/14/2022  Medication Sig   acetaminophen  (TYLENOL) 500 MG tablet Take 500 mg by mouth as needed.   amLODipine (NORVASC) 5 MG tablet Take 1 tablet (5 mg total) by mouth daily with breakfast.   anastrozole (ARIMIDEX) 1 MG tablet Take 1 tablet (1 mg total) by mouth daily.   atenolol (TENORMIN) 25 MG tablet Take 1 tablet (25 mg total) by mouth at bedtime.   Cholecalciferol (D3 PO) Take 250 mcg by mouth daily.   cholestyramine (QUESTRAN) 4 g packet Take 1 packet (4 g total) by mouth daily with water or a non-carbonated drink.   Cyanocobalamin (B-12) 1000 MCG TABS Take by mouth daily.   diclofenac Sodium (VOLTAREN) 1 % GEL Apply topically as needed.   hydrALAZINE (APRESOLINE) 10 MG tablet Take 10 mg by mouth as needed (for blood pressure 150 or above).   HYDROcodone bit-homatropine (HYCODAN) 5-1.5 MG/5ML syrup Take 5 mLs by mouth at bedtime as needed for cough.   lidocaine (SALONPAS PAIN RELIEVING) 4 % Place onto the skin as needed.   mirtazapine (REMERON) 15 MG tablet Take 0.5 tablets (7.5 mg total) by mouth at bedtime as needed for sleep.   olmesartan (BENICAR) 40 MG tablet Take 0.5 tablets (20 mg total) by mouth daily.   pantoprazole (PROTONIX) 40 MG tablet Take 1 tablet (40 mg total) by mouth daily.   [DISCONTINUED] amoxicillin (AMOXIL) 500 MG capsule Take 1 capsule (500 mg total) by mouth 3 (three) times daily. (Patient not taking:  Reported on 11/14/2022)   No facility-administered encounter medications on file as of 11/14/2022.    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   Diverticulosis    Esophageal reflux    Family history of breast cancer 11/25/2021   Hiatal hernia    Hx of colonic polyps    Hypertension    Per new patient packet   Mild asthma    Per new patient packet   Osteoarthritis    Osteopenia    bone density 1-09 and 2-13   Pulmonary nodules    scattered-CT Scan July 2010, 12-10, and 04-2010-all stable felt benign     Past Surgical History:  Procedure Laterality Date   BREAST LUMPECTOMY WITH RADIOACTIVE SEED LOCALIZATION Left 12/07/2021   Procedure: LEFT BREAST LUMPECTOMY WITH RADIOACTIVE SEED LOCALIZATION;  Surgeon: Griselda Miner, MD;  Location: [facility];  Service: General;  Laterality: Left;   CHOLECYSTECTOMY  04/25/1972   Gall Bladder; [facility] Fus [facility]   DG PORTABLE CHEST X RAY ([facility] HX)  2023   Shoulder   DIAGNOSTIC MAMMOGRAM  2022   [facility]; Per new patient packet   JOINT REPLACEMENT Left 08/24/2010   JOINT REPLACEMENT Left 04/25/2010   Reverse    orthroscopic  Rotator Cuff Left 04/25/2009   Partial shoulder Left 04/26/2003   ROTATOR CUFF REPAIR     x 2 1998 and 1999   SPINE SURGERY  04/26/1983   disk   TONSILLECTOMY  04/25/1950   Dr. Elonda Husky; Per new patient packet   Family History  Problem Relation Age of Onset   Cancer Mother        cervical   Alzheimer's disease Mother    Asthma Father    Other Father        Cerebral hemorrhage   Heart disease Sister        Heart disease before age 67   Hypertension Sister    Stroke Maternal Grandfather    Heart attack Maternal Grandfather    Breast cancer Daughter 63   Asthma Son    Cancer Cousin        dx early 69s; unknown type; paternal female cousin   Neuropathy Neg Hx    Social History   Socioeconomic History   Marital status: Widowed    Spouse name: Not on file   Number of children: 2   Years of education: Not on file   Highest education level: Not on file  Occupational History   Occupation: Retired  Tobacco Use   Smoking status: Never   Smokeless tobacco: Never  Vaping Use   Vaping status: Never Used  Substance and Sexual Activity   Alcohol use: Not Currently   Drug use: No   Sexual activity: Not Currently    Birth control/protection: Post-menopausal  Other Topics Concern   Not on file  Social History Narrative   Diet: Left blank      Caffeine: Yes      Married, if yes what year: Widow; married in  1957       Do you live in a house, apartment, assisted living, condo, trailer, ect: [facility]       Is it one or more stories: [place] Kail       How many persons live in your home? One      Pets: Cat      Highest level or education completed: Left blank  Current/Past profession: Artist       Exercise: Yes                 Type and how often: Walk         Living Will: Yes   DNR: Yes    POA/HPOA: Left blank       Functional Status:   Do you have difficulty bathing or dressing yourself? Left blank    Do you have difficulty preparing food or eating? Left blank    Do you have difficulty managing your medications? Left blank    Do you have difficulty managing your finances? Left blank    Do you have difficulty affording your medications? Left blank    Social Determinants of Health   Financial Resource Strain: Low Risk  (12/21/2021)   Overall Financial Resource Strain (CARDIA)    Difficulty of Paying Living Expenses: Not hard at all  Food Insecurity: No Food Insecurity (12/21/2021)   Hunger Vital Sign    Worried About Running Out of Food in the Last Year: Never true    Ran Out of Food in the Last Year: Never true  Transportation Needs: No Transportation Needs (12/21/2021)   PRAPARE - Administrator, Civil Service (Medical): No    Lack of Transportation (Non-Medical): No  Physical Activity: Not on file  Stress: Not on file  Social Connections: Not on file    Tobacco Counseling Counseling given: Not Answered   Clinical Intake:  Pre-visit preparation completed: No  Pain : No/denies pain     BMI - recorded: 22.1 Nutritional Status: BMI of 19-24  Normal Nutritional Risks: None Diabetes: No  How often do you need to have someone help you when you read instructions, pamphlets, or other written materials from your doctor or pharmacy?: 1 - Never What is the last grade level you completed in school?: college  Interpreter Needed?: No  Information  entered by :: Fletcher Anon NP   Activities of Daily Living    11/14/2022    4:08 PM 11/14/2022    3:31 PM  In your present state of health, do you have any difficulty performing the following activities:  Hearing? 0 0  Vision? 0 0  Difficulty concentrating or making decisions? 0 0  Walking or climbing stairs? 0 0  Dressing or bathing? 0 0  Doing errands, shopping? 0 0  Preparing Food and eating ? N   Using the Toilet? N   In the past six months, have you accidently leaked urine? N   Do you have problems with loss of bowel control? N   Managing your Medications? N   Managing your Finances? N     Patient Care Team: Mahlon Gammon, MD as PCP - General (Internal Medicine) Jodelle Red, MD as PCP - Cardiology (Cardiology) Venancio Poisson, MD as Attending Physician (Dermatology) Gerald Leitz, MD as Consulting Physician (Obstetrics and Gynecology) Maris Berger, MD as Consulting Physician (Ophthalmology) Pershing Proud, RN as Oncology Nurse Navigator Donnelly Angelica, RN as Oncology Nurse Navigator Griselda Miner, MD as Consulting Physician (General Surgery) Malachy Mood, MD as Consulting Physician (Hematology) Antony Blackbird, MD as Consulting Physician (Radiation Oncology)  Indicate any recent Medical Services you may have received from other than [facility] providers in the past year (date may be approximate).     Assessment:   This is a routine wellness examination for Tammy Lindsey.  Hearing/Vision screen Hearing Screening - Comments:: Patient states she has had one in the  last 12 months Vision Screening - Comments:: Patient has had a hearing test last week  Dietary issues and exercise activities discussed:     Goals Addressed   None    Depression Screen    11/14/2022    3:29 PM 09/27/2022   10:30 AM 08/29/2022    1:05 PM 05/17/2022    2:41 PM 05/03/2022    3:02 PM 04/05/2022    2:15 PM 03/29/2022    9:12 AM  PHQ 2/9 Scores  PHQ - 2 Score 0 0 0 0 1 0 0    Fall Risk     11/14/2022    3:28 PM 09/27/2022   10:30 AM 08/29/2022    1:04 PM 05/17/2022    2:35 PM 05/03/2022    3:02 PM  Fall Risk   Falls in the past year? 0 0 0 0 0  Number falls in past yr: 0 0 0 0 0  Injury with Fall? 0 0 0 0 0  Risk for fall due to : No Fall Risks No Fall Risks No Fall Risks No Fall Risks No Fall Risks  Follow up Falls evaluation completed Falls evaluation completed Falls evaluation completed Falls evaluation completed Falls evaluation completed    MEDICARE RISK AT HOME:  Medicare Risk at Home - 11/14/22 1547     Any stairs in or around the home? No    If so, are there any without handrails? No    Home free of loose throw rugs in walkways, pet beds, electrical cords, etc? Yes    Adequate lighting in your home to reduce risk of falls? Yes    Life alert? No    Use of a cane, walker or w/c? No    Grab bars in the bathroom? Yes    Shower chair or bench in shower? Yes    Elevated toilet seat or a handicapped toilet? No             TIMED UP AND GO:  Was the test performed?  Yes  Length of time to ambulate 10 feet: 12 sec Gait slow and steady without use of assistive device    Cognitive Function:    11/14/2022    3:56 PM 11/14/2022    3:31 PM  MMSE - Mini Mental State Exam  Orientation to time 5 5  Orientation to Place 5 5  Registration 3 3  Attention/ Calculation 5 5  Recall 2 1  Language- name 2 objects 2 2  Language- repeat 1 1  Language- follow 3 step command 3 3  Language- read & follow direction 1 1  Write a sentence 1 1  Copy design 1 1  Total score 29 28        Immunizations Immunization History  Administered Date(s) Administered   Influenza Split 01/10/2011, 12/27/2011, 01/09/2012, 01/26/2015, 12/17/2017, 01/12/2019, 12/19/2019, 12/19/2020   Influenza-Unspecified 12/24/2021   Moderna Covid-19 Vaccine Bivalent Booster 35yrs & up 02/05/2021, 09/27/2021, 01/27/2022   Moderna SARS-COV2 Booster Vaccination 03/10/2020, 11/24/2020, 12/11/2021   Moderna  Sars-Covid-2 Vaccination 05/07/2019, 06/04/2019   PNEUMOCOCCAL CONJUGATE-20 04/01/2022   Pneumococcal Polysaccharide-23 07/21/2020, 11/10/2020   Respiratory Syncytial Virus Vaccine,Recomb Aduvanted(Arexvy) 04/14/2022   Tdap 06/13/2022   Zoster Recombinant(Shingrix) 09/06/2016, 11/22/2016    TDAP status: Up to date  Flu Vaccine status: Up to date  Pneumococcal vaccine status: Up to date  Covid-19 vaccine status: Completed vaccines  Qualifies for Shingles Vaccine? Yes   Zostavax completed Yes   Shingrix Completed?: Yes  Screening Tests Health  Maintenance  Topic Date Due   COVID-19 Vaccine (5 - 2023-24 season) 02/15/2023 (Originally 03/24/2022)   INFLUENZA VACCINE  11/24/2022   Medicare Annual Wellness (AWV)  11/14/2023   DTaP/Tdap/Td (2 - Td or Tdap) 06/13/2032   Pneumonia Vaccine 72+ Years old  Completed   DEXA SCAN  Completed   Zoster Vaccines- Shingrix  Completed   HPV VACCINES  Aged Out    Health Maintenance  There are no preventive care reminders to display for this patient.  Colorectal cancer screening: No longer required.   Mammogram status: Completed 11/14/22. Repeat every year  Bone Density status: Completed Jan 2024. Results reflect: Bone density results: NORMAL. Repeat every need to obtain records.  years.  Lung Cancer Screening: (Low Dose CT Chest recommended if Age 39-80 years, 20 pack-year currently smoking OR have quit w/in 15years.) does not qualify.   Lung Cancer Screening Referral: na  Additional Screening:  Hepatitis C Screening: does not qualify; Completed na  Vision Screening: Recommended annual ophthalmology exams for early detection of glaucoma and other disorders of the eye. Is the patient up to date with their annual eye exam?  Yes  Who is the provider or what is the name of the office in which the patient attends annual eye exams? [facility] If pt is not established with a provider, would they like to be referred to a provider to establish  care? No .   Dental Screening: Recommended annual dental exams for proper oral hygiene  Diabetic Foot Exam: Diabetic Foot Exam: Completed does not have diabetes  Community Resource Referral / Chronic Care Management: CRR required this visit?  No   CCM required this visit?  No     Plan:     I have personally reviewed and noted the following in the patient's chart:   Medical and social history Use of alcohol, tobacco or illicit drugs  Current medications and supplements including opioid prescriptions. Patient is not currently taking opioid prescriptions. Functional ability and status Nutritional status Physical activity Advanced directives List of other physicians Hospitalizations, surgeries, and ER visits in previous 12 months Vitals Screenings to include cognitive, depression, and falls Referrals and appointments  In addition, I have reviewed and discussed with patient certain preventive protocols, quality metrics, and best practice recommendations. A written personalized care plan for preventive services as well as general preventive health recommendations were provided to patient.     Fletcher Anon, NP   11/14/2022   After Visit Summary: given to patient

## 2022-11-15 ENCOUNTER — Encounter (HOSPITAL_BASED_OUTPATIENT_CLINIC_OR_DEPARTMENT_OTHER): Payer: Self-pay

## 2022-11-15 ENCOUNTER — Non-Acute Institutional Stay: Payer: PPO | Admitting: Internal Medicine

## 2022-11-15 ENCOUNTER — Encounter: Payer: Self-pay | Admitting: Internal Medicine

## 2022-11-15 VITALS — BP 142/82 | HR 87 | Temp 97.9°F | Resp 17 | Ht 62.5 in | Wt 122.0 lb

## 2022-11-15 DIAGNOSIS — I10 Essential (primary) hypertension: Secondary | ICD-10-CM | POA: Diagnosis not present

## 2022-11-15 DIAGNOSIS — N8110 Cystocele, unspecified: Secondary | ICD-10-CM | POA: Diagnosis not present

## 2022-11-15 MED ORDER — OLMESARTAN MEDOXOMIL 20 MG PO TABS: 20.0000 mg | ORAL_TABLET | Freq: Every day | ORAL | 3 refills | Status: DC

## 2022-11-15 NOTE — Progress Notes (Signed)
Location: [facility] of Service:  Clinic (12)  Provider:   Code Status:  Goals of Care:     11/15/2022    2:12 PM  Advanced Directives  Does Patient Have a Medical Advance Directive? Yes  Type of Advance Directive Living will;Healthcare Power of [place];Out of facility DNR (pink MOST or yellow form)  Does patient want to make changes to medical advance directive? No - Patient declined  Copy of Healthcare Power of Attorney in Chart? No - copy requested     Chief Complaint  Patient presents with   Acute Visit    Patient states she thinks something is wrong with bladder    HPI: Patient is a 89 y.o. 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   Diverticulosis    Esophageal reflux    Family history of breast cancer 11/25/2021   Hiatal hernia    Hx of colonic polyps    Hypertension    Per new patient packet   Mild asthma    Per new patient packet   Osteoarthritis    Osteopenia    bone density 1-09 and 2-13   Pulmonary nodules    scattered-CT Scan July 2010, 12-10, and 04-2010-all stable felt benign     Past Surgical History:  Procedure Laterality Date   BREAST LUMPECTOMY WITH RADIOACTIVE SEED LOCALIZATION Left 12/07/2021   Procedure: LEFT BREAST LUMPECTOMY WITH RADIOACTIVE SEED LOCALIZATION;  Surgeon: Griselda Miner, MD;  Location: [facility];  Service: General;  Laterality: Left;   CHOLECYSTECTOMY   04/25/1972   Gall Bladder; [facility]   DG PORTABLE CHEST X RAY ([facility] HX)  2023   Shoulder   DIAGNOSTIC MAMMOGRAM  2022   [facility]; Per new patient packet   JOINT REPLACEMENT Left 08/24/2010   JOINT REPLACEMENT Left 04/25/2010   Reverse    orthroscopic  Rotator Cuff Left 04/25/2009   Partial shoulder Left 04/26/2003   ROTATOR CUFF REPAIR     x 2 1998 and 1999   SPINE SURGERY  04/26/1983   disk   TONSILLECTOMY  04/25/1950   Dr. Elonda Husky; Per new patient packet    Allergies  Allergen Reactions   Sulfa Antibiotics     Outpatient Encounter Medications as of 11/15/2022  Medication Sig   acetaminophen (TYLENOL) 500 MG tablet Take 500 mg by mouth as needed.   amLODipine (NORVASC) 5 MG tablet Take 1 tablet (5 mg total) by mouth daily with breakfast.   anastrozole (ARIMIDEX) 1 MG tablet Take 1 tablet (1 mg total) by mouth daily.   atenolol (TENORMIN) 25 MG tablet Take 1 tablet (25 mg total) by mouth at bedtime.   Cholecalciferol (D3 PO) Take 250 mcg by mouth daily.   cholestyramine (QUESTRAN) 4 g packet Take 1 packet (4 g total) by mouth daily with water or a non-carbonated drink.   Cyanocobalamin (B-12) 1000 MCG TABS Take by mouth daily.  diclofenac Sodium (VOLTAREN) 1 % GEL Apply topically as needed.   hydrALAZINE (APRESOLINE) 10 MG tablet Take 10 mg by mouth as needed (for blood pressure 150 or above).   HYDROcodone bit-homatropine (HYCODAN) 5-1.5 MG/5ML syrup Take 5 mLs by mouth at bedtime as needed for cough.   lidocaine (SALONPAS PAIN RELIEVING) 4 % Place onto the skin as needed.   mirtazapine (REMERON) 15 MG tablet Take 0.5 tablets (7.5 mg total) by mouth at bedtime as needed for sleep.   olmesartan (BENICAR) 20 MG tablet Take 1 tablet (20 mg total) by mouth daily.   pantoprazole (PROTONIX) 40 MG tablet Take 1 tablet (40 mg total) by mouth daily.   No facility-administered encounter medications on file as of 11/15/2022.    Review of Systems:  Review of Systems   Constitutional:  Negative for activity change and appetite change.  HENT: Negative.    Respiratory:  Negative for cough and shortness of breath.   Cardiovascular:  Negative for leg swelling.  Gastrointestinal:  Negative for constipation.  Genitourinary: Negative.   Musculoskeletal:  Negative for arthralgias, gait problem and myalgias.  Skin: Negative.   Neurological:  Negative for dizziness and weakness.  Psychiatric/Behavioral:  Negative for confusion, dysphoric mood and sleep disturbance.     Health Maintenance  Topic Date Due   COVID-19 Vaccine (5 - 2023-24 season) 02/15/2023 (Originally 03/24/2022)   INFLUENZA VACCINE  11/24/2022   Medicare Annual Wellness (AWV)  11/14/2023   DTaP/Tdap/Td (2 - Td or Tdap) 06/13/2032   Pneumonia Vaccine 32+ Years old  Completed   DEXA SCAN  Completed   Zoster Vaccines- Shingrix  Completed   HPV VACCINES  Aged Out    Physical Exam: Vitals:   11/15/22 1410  BP: (!) 142/82  Pulse: 87  Resp: 17  Temp: 97.9 F (36.6 C)  TempSrc: Temporal  SpO2: 97%  Weight: 122 lb (55.3 kg)  Height: 5' 2.5" (1.588 m)   Body mass index is 21.96 kg/m. Physical Exam Vitals reviewed.  Constitutional:      Appearance: Normal appearance.  HENT:     Head: Normocephalic.     Nose: Nose normal.     Mouth/Throat:     Mouth: Mucous membranes are moist.     Pharynx: Oropharynx is clear.  Eyes:     Pupils: Pupils are equal, round, and reactive to light.  Cardiovascular:     Rate and Rhythm: Normal rate and regular rhythm.     Pulses: Normal pulses.     Heart sounds: Normal heart sounds. No murmur heard. Pulmonary:     Effort: Pulmonary effort is normal.     Breath sounds: Normal breath sounds.  Abdominal:     General: Abdomen is flat. Bowel sounds are normal.     Palpations: Abdomen is soft.  Genitourinary:    General: Normal vulva.     Rectum: Normal.     Comments: On coughing I noticed a Vaginal anterior wall prolapse but grade  1  Musculoskeletal:        General: No swelling.     Cervical back: Neck supple.  Skin:    General: Skin is warm.  Neurological:     General: No focal deficit present.     Mental Status: She is alert and oriented to person, place, and time.  Psychiatric:        Mood and Affect: Mood normal.        Thought Content: Thought content normal.     Labs reviewed: Basic  Metabolic Panel: Recent Labs    11/18/21 0000 11/24/21 0818 02/03/22 0205 06/08/22 1231 11/03/22 0846 11/08/22 0839  NA 134*   < > 130*  --  135 137  K 4.5   < > 4.2  --  5.3* 4.7  CL 97*   < > 95*  --  99 101  CO2 24*   < > 27  --  22 21  GLUCOSE  --    < > 104*  --  83 93  BUN 13   < > 8  --  19 13  CREATININE 0.6   < > 0.60  --  0.84 0.73  CALCIUM 9.1   < > 9.7  --  9.3 9.6  TSH 1.41  --   --  0.798  --   --    < > = values in this interval not displayed.   Liver Function Tests: Recent Labs    11/18/21 0000 11/24/21 0818 06/08/22 1224  AST 14 12*  --   ALT 18 11  --   ALKPHOS 67 54  --   BILITOT  --  0.5  --   PROT  --  6.9 6.4  ALBUMIN  --  4.4  --    No results for input(s): "LIPASE", "AMYLASE" in the last 8760 hours. No results for input(s): "AMMONIA" in the last 8760 hours. CBC: Recent Labs    11/18/21 0000 11/24/21 0818 02/03/22 0205  WBC 7.5 6.4 8.4  NEUTROABS  --  4.4 4.9  HGB 13.7 13.7 14.4  HCT  --  39.3 41.9  MCV  --  82.2 81.2  PLT 328 300 331   Lipid Panel: Recent Labs    11/18/21 0000  CHOL 152  HDL 55  LDLCALC 70  TRIG 136   Lab Results  Component Value Date   HGBA1C 5.5 06/08/2022    Procedures since last visit: No results found.  Assessment/Plan 1. Vaginal prolapse Patient refused Gynecologist Referral  Refused Pessary Pelvic strengthening exercise  2. Primary hypertension Doing well Follows with Cardiology    Labs/tests ordered:  * No order type specified * Next appt:  04/04/2023

## 2022-11-16 ENCOUNTER — Other Ambulatory Visit (HOSPITAL_BASED_OUTPATIENT_CLINIC_OR_DEPARTMENT_OTHER): Payer: Self-pay

## 2022-11-16 ENCOUNTER — Other Ambulatory Visit: Payer: Self-pay | Admitting: Adult Health

## 2022-11-16 ENCOUNTER — Other Ambulatory Visit: Payer: Self-pay

## 2022-11-16 ENCOUNTER — Other Ambulatory Visit (HOSPITAL_BASED_OUTPATIENT_CLINIC_OR_DEPARTMENT_OTHER): Payer: Self-pay | Admitting: Family

## 2022-11-16 MED ORDER — OLMESARTAN MEDOXOMIL 20 MG PO TABS
20.0000 mg | ORAL_TABLET | Freq: Every day | ORAL | 3 refills | Status: DC
Start: 2022-11-16 — End: 2023-10-12
  Filled 2022-11-16: qty 90, 90d supply, fill #0
  Filled 2023-02-13: qty 90, 90d supply, fill #1
  Filled 2023-05-15: qty 90, 90d supply, fill #2
  Filled 2023-08-10: qty 90, 90d supply, fill #3

## 2022-11-16 NOTE — Addendum Note (Signed)
Addended by: Regis Bill B on: 11/16/2022 11:26 AM   Modules accepted: Orders

## 2022-11-16 NOTE — Telephone Encounter (Signed)
Pharmacy requested refill.  Epic LR: 08/29/2022 No Contract. Note added to upcoming appointment.   Pended Rx and sent to Dr. Chales Abrahams for approval.

## 2022-11-18 ENCOUNTER — Other Ambulatory Visit (HOSPITAL_BASED_OUTPATIENT_CLINIC_OR_DEPARTMENT_OTHER): Payer: Self-pay

## 2022-11-18 MED ORDER — HYDROCODONE BIT-HOMATROP MBR 5-1.5 MG/5ML PO SOLN
5.0000 mL | Freq: Every evening | ORAL | 0 refills | Status: DC | PRN
Start: 2022-11-18 — End: 2023-01-01
  Filled 2022-11-18: qty 240, 48d supply, fill #0

## 2022-11-21 ENCOUNTER — Encounter (HOSPITAL_BASED_OUTPATIENT_CLINIC_OR_DEPARTMENT_OTHER): Payer: Self-pay | Admitting: Cardiology

## 2022-11-21 ENCOUNTER — Ambulatory Visit (HOSPITAL_BASED_OUTPATIENT_CLINIC_OR_DEPARTMENT_OTHER): Payer: PPO | Admitting: Cardiology

## 2022-11-21 VITALS — BP 128/61 | HR 70 | Ht 62.5 in | Wt 122.3 lb

## 2022-11-21 DIAGNOSIS — R42 Dizziness and giddiness: Secondary | ICD-10-CM | POA: Diagnosis not present

## 2022-11-21 DIAGNOSIS — Z7182 Exercise counseling: Secondary | ICD-10-CM

## 2022-11-21 DIAGNOSIS — I10 Essential (primary) hypertension: Secondary | ICD-10-CM

## 2022-11-21 DIAGNOSIS — Z7189 Other specified counseling: Secondary | ICD-10-CM

## 2022-11-21 NOTE — Patient Instructions (Signed)

## 2022-11-21 NOTE — Progress Notes (Signed)
Cardiology Office Note:  .    Date:  11/21/2022  ID:  Tammy Lindsey, DOB 09-07-1931, MRN [id number] PCP: Mahlon Gammon, MD  [facility] Providers Cardiologist:  Jodelle Red, MD     History of Present Illness: .    Tammy Lindsey is a 89 y.o. female with a hx of hypertension, breast cancer, osteopenia, and asthma who is seen for follow up today. I initially met her 06/15/22 as a new consult at the request of [place], Amy E, NP for the evaluation and management of hypertension.   Cardiac hx and risk factors: Comorbid conditions: Hypertension. Family history: Her son has A-Fib.  Prior cardiac testing and/or incidental findings on other testing (ie coronary calcium): LE venous duplex, Bilateral carotid duplex Exercise level: She used to like exercising but lately has been unable to.    At her initial visit 05/2022, she reported fluctuating blood pressures. Per her home BP log, she had low readings such as 109/49 and 113/53, but she noted highs up to 160 systolic. Complained of pressure in her head and headaches with high blood pressures. She tends to lie down and do breathing exercising to help. No sickness, pain, or anxiety during the time of these episodes. She also complained of dizziness and fatigue that she attributed to her Hydralazine 25 mg. Amlodipine had been previously discontinued for dizziness/lightheadedness, but her symptoms were unchanged after the medication was stopped. She had felt more lightheaded on hydralazine and dropped the midday dose. We restarted low dose amlodipine, and changed hydralazine from 25 mg TID to 10 mg PRN for systolic BP >160. On 07/19/2022 she was seen by Perlie Gold, PA-C and continued to struggle with fatigue. Atenolol was decreased to 25 mg nightly, and amlodipine was increased to 5 mg daily. Amlodipine was later re-timed to the evenings. At her follow-up with Gillian Shields, NP 07/2022, blood pressure was mildly elevated in clinic with home  average 128/75. Some hypotensive readings with 110s systolic. She noted lightheadedness mostly in the mornings after taking losartan. Losartan was adjusted to 25 mg in the AM, and 50 mg in the PM. Energy levels had improved with reduced dose of atenolol. In 09/2022 Losartan was discontinued and she was switched to olmesartan 40 mg daily. Olmesartan was subsequently reduced to 20 mg in the evening due to morning fatigue and dizziness.  Today, she is feeling okay. In the office her blood pressure is 128/61. She presents a BP log which is personally reviewed. In the mornings her blood pressure is typically well controlled with average readings in the 110s systolic (lowest 98). By the afternoons, she has measured her blood pressure in the 140s systolic. She has not needed to take the hydralazine doses consistently. Confirms tolerating her current antihypertensive regimen without recent dizziness or lightheadedness. No passing out. Her heart rate has been averaging mostly in the 60s at home. She denies palpitations.  Due to the hot weather lately she has not been very active. Typically she walks around the yard.  She complains of intermittent LE edema, L>R. Usually her swelling is better in the mornings.  She denies any chest pain, shortness of breath, headaches, syncope, orthopnea, or PND.  ROS:  Please see the history of present illness. ROS otherwise negative except as noted.  (+) LE edema, L>R  Studies Reviewed: Marland Kitchen         Bilateral Carotid Dopplers  06/14/2022: Summary:  Right Carotid: Velocities in the right ICA are consistent with a 40-59%  stenosis. Non-hemodynamically significant plaque <50% noted in the CCA. The ECA appears <50% stenosed.   Left Carotid: Velocities in the left ICA are consistent with a 1-39% stenosis. Non-hemodynamically significant plaque <50% noted in the CCA.   Vertebrals: Bilateral vertebral arteries demonstrate antegrade flow. Right vertebral artery demonstrates high  resistant flow.   Subclavians: Right subclavian artery was stenotic. Normal flow hemodynamics were seen in the left subclavian artery.   Physical Exam:    VS:  BP 128/61 (BP Location: Left Arm, Patient Position: Sitting, Cuff Size: Normal)   Pulse 70   Ht 5' 2.5" (1.588 m)   Wt 122 lb 4.8 oz (55.5 kg)   SpO2 98%   BMI 22.01 kg/m    Wt Readings from Last 3 Encounters:  11/21/22 122 lb 4.8 oz (55.5 kg)  11/15/22 122 lb (55.3 kg)  11/14/22 122 lb 12.8 oz (55.7 kg)    GEN: Well nourished, well developed in no acute distress HEENT: Normal, moist mucous membranes NECK: No JVD CARDIAC: regular rhythm, normal S1 and S2, no rubs or gallops. No murmur. VASCULAR: Radial and DP pulses 2+ bilaterally. No carotid bruits RESPIRATORY:  Clear to auscultation without rales, wheezing or rhonchi  ABDOMEN: Soft, non-tender, non-distended MUSCULOSKELETAL:  Ambulates independently SKIN: Warm and dry, no edema NEUROLOGIC:  Alert and oriented x 3. No focal neuro deficits noted. PSYCHIATRIC:  Normal affect   ASSESSMENT AND PLAN: .    Hypertension Intermittent lightheadedness -feeling much better on current regimen. Atenolol dose decreased and she feels better with this. Tolerating amlodipine, olmesartan. Has only needed PRN hydralazine once.   Counseling on prevention Encouraged activity, especially walking. She does well with this. Discussed activity goals.  Dispo: Follow-up in 6 months, or sooner as needed.  I,Mathew Stumpf,acting as a Neurosurgeon for [employer], MD.,have documented all relevant documentation on the behalf of Jodelle Red, MD,as directed by  Jodelle Red, MD while in the presence of Jodelle Red, MD.  I, Jodelle Red, MD, have reviewed all documentation for this visit. The documentation on 11/21/22 for the exam, diagnosis, procedures, and orders are all accurate and complete.   Signed, Jodelle Red, MD

## 2022-11-29 ENCOUNTER — Encounter (HOSPITAL_BASED_OUTPATIENT_CLINIC_OR_DEPARTMENT_OTHER): Payer: Self-pay

## 2022-12-01 DIAGNOSIS — E538 Deficiency of other specified B group vitamins: Secondary | ICD-10-CM | POA: Diagnosis not present

## 2022-12-01 DIAGNOSIS — G4700 Insomnia, unspecified: Secondary | ICD-10-CM | POA: Diagnosis not present

## 2022-12-01 DIAGNOSIS — Z9849 Cataract extraction status, unspecified eye: Secondary | ICD-10-CM | POA: Diagnosis not present

## 2022-12-01 DIAGNOSIS — M1990 Unspecified osteoarthritis, unspecified site: Secondary | ICD-10-CM | POA: Diagnosis not present

## 2022-12-01 DIAGNOSIS — E559 Vitamin D deficiency, unspecified: Secondary | ICD-10-CM | POA: Diagnosis not present

## 2022-12-01 DIAGNOSIS — I10 Essential (primary) hypertension: Secondary | ICD-10-CM | POA: Diagnosis not present

## 2022-12-01 DIAGNOSIS — C50919 Malignant neoplasm of unspecified site of unspecified female breast: Secondary | ICD-10-CM | POA: Diagnosis not present

## 2022-12-01 DIAGNOSIS — K219 Gastro-esophageal reflux disease without esophagitis: Secondary | ICD-10-CM | POA: Diagnosis not present

## 2022-12-12 ENCOUNTER — Other Ambulatory Visit (HOSPITAL_BASED_OUTPATIENT_CLINIC_OR_DEPARTMENT_OTHER): Payer: Self-pay

## 2022-12-12 ENCOUNTER — Other Ambulatory Visit: Payer: Self-pay

## 2022-12-14 ENCOUNTER — Other Ambulatory Visit (HOSPITAL_BASED_OUTPATIENT_CLINIC_OR_DEPARTMENT_OTHER): Payer: Self-pay

## 2022-12-14 ENCOUNTER — Other Ambulatory Visit: Payer: Self-pay

## 2022-12-16 ENCOUNTER — Encounter (HOSPITAL_BASED_OUTPATIENT_CLINIC_OR_DEPARTMENT_OTHER): Payer: Self-pay

## 2022-12-18 ENCOUNTER — Other Ambulatory Visit (HOSPITAL_BASED_OUTPATIENT_CLINIC_OR_DEPARTMENT_OTHER): Payer: Self-pay

## 2022-12-19 NOTE — Telephone Encounter (Signed)
Please advise 

## 2022-12-20 ENCOUNTER — Other Ambulatory Visit (HOSPITAL_BASED_OUTPATIENT_CLINIC_OR_DEPARTMENT_OTHER): Payer: Self-pay

## 2023-01-01 ENCOUNTER — Other Ambulatory Visit (HOSPITAL_BASED_OUTPATIENT_CLINIC_OR_DEPARTMENT_OTHER): Payer: Self-pay

## 2023-01-01 ENCOUNTER — Other Ambulatory Visit: Payer: Self-pay | Admitting: Internal Medicine

## 2023-01-02 ENCOUNTER — Other Ambulatory Visit (HOSPITAL_BASED_OUTPATIENT_CLINIC_OR_DEPARTMENT_OTHER): Payer: Self-pay

## 2023-01-02 ENCOUNTER — Encounter: Payer: Self-pay | Admitting: Internal Medicine

## 2023-01-02 MED ORDER — HYDROCODONE BIT-HOMATROP MBR 5-1.5 MG/5ML PO SOLN
5.0000 mL | Freq: Every evening | ORAL | 0 refills | Status: DC | PRN
  Filled 2023-01-02: qty 240, 48d supply, fill #0

## 2023-01-02 MED ORDER — CHOLESTYRAMINE 4 G PO PACK
1.0000 | PACK | Freq: Every day | ORAL | 0 refills | Status: DC
  Filled 2023-01-02: qty 90, 90d supply, fill #0

## 2023-01-02 NOTE — Telephone Encounter (Signed)
Patient has request refill on medication Hydrocodone 5-1.5mg . Patient medication last refilled 11/18/2022. Patient has no contract on file. Medication pend and sent to PCP Mahlon Gammon, MD for approval.

## 2023-01-03 ENCOUNTER — Other Ambulatory Visit (HOSPITAL_BASED_OUTPATIENT_CLINIC_OR_DEPARTMENT_OTHER): Payer: Self-pay

## 2023-01-03 ENCOUNTER — Other Ambulatory Visit: Payer: Self-pay

## 2023-01-03 MED ORDER — FLUAD 0.5 ML IM SUSY
0.5000 mL | PREFILLED_SYRINGE | Freq: Once | INTRAMUSCULAR | 0 refills | Status: AC
  Filled 2023-01-03: qty 0.5, 1d supply, fill #0

## 2023-01-03 MED ORDER — COMIRNATY 30 MCG/0.3ML IM SUSY
0.3000 mL | PREFILLED_SYRINGE | Freq: Once | INTRAMUSCULAR | 0 refills | Status: AC
  Filled 2023-01-03: qty 0.3, 1d supply, fill #0

## 2023-01-10 DIAGNOSIS — M25512 Pain in left shoulder: Secondary | ICD-10-CM | POA: Diagnosis not present

## 2023-01-12 ENCOUNTER — Other Ambulatory Visit (HOSPITAL_BASED_OUTPATIENT_CLINIC_OR_DEPARTMENT_OTHER): Payer: Self-pay

## 2023-01-12 ENCOUNTER — Other Ambulatory Visit: Payer: Self-pay

## 2023-01-13 ENCOUNTER — Other Ambulatory Visit (HOSPITAL_BASED_OUTPATIENT_CLINIC_OR_DEPARTMENT_OTHER): Payer: Self-pay

## 2023-01-17 DIAGNOSIS — M5416 Radiculopathy, lumbar region: Secondary | ICD-10-CM | POA: Diagnosis not present

## 2023-01-26 ENCOUNTER — Other Ambulatory Visit (HOSPITAL_BASED_OUTPATIENT_CLINIC_OR_DEPARTMENT_OTHER): Payer: Self-pay

## 2023-02-09 ENCOUNTER — Encounter: Payer: Self-pay | Admitting: Internal Medicine

## 2023-02-13 ENCOUNTER — Other Ambulatory Visit: Payer: Self-pay

## 2023-02-13 ENCOUNTER — Other Ambulatory Visit (HOSPITAL_BASED_OUTPATIENT_CLINIC_OR_DEPARTMENT_OTHER): Payer: Self-pay

## 2023-02-13 ENCOUNTER — Encounter (HOSPITAL_BASED_OUTPATIENT_CLINIC_OR_DEPARTMENT_OTHER): Payer: Self-pay

## 2023-02-13 ENCOUNTER — Other Ambulatory Visit: Payer: Self-pay | Admitting: Internal Medicine

## 2023-02-13 MED ORDER — PANTOPRAZOLE SODIUM 40 MG PO TBEC
40.0000 mg | DELAYED_RELEASE_TABLET | Freq: Every day | ORAL | 3 refills | Status: DC
  Filled 2023-02-13: qty 30, 30d supply, fill #0
  Filled 2023-03-25 (×2): qty 30, 30d supply, fill #1
  Filled 2023-05-07: qty 30, 30d supply, fill #2
  Filled 2023-06-04: qty 30, 30d supply, fill #3

## 2023-02-16 ENCOUNTER — Other Ambulatory Visit (HOSPITAL_BASED_OUTPATIENT_CLINIC_OR_DEPARTMENT_OTHER): Payer: Self-pay

## 2023-02-17 ENCOUNTER — Ambulatory Visit (HOSPITAL_BASED_OUTPATIENT_CLINIC_OR_DEPARTMENT_OTHER): Payer: PPO | Admitting: Cardiology

## 2023-02-17 ENCOUNTER — Encounter (HOSPITAL_BASED_OUTPATIENT_CLINIC_OR_DEPARTMENT_OTHER): Payer: Self-pay | Admitting: Cardiology

## 2023-02-17 VITALS — BP 110/55 | HR 76 | Ht 62.0 in | Wt 117.0 lb

## 2023-02-17 DIAGNOSIS — G8929 Other chronic pain: Secondary | ICD-10-CM | POA: Diagnosis not present

## 2023-02-17 DIAGNOSIS — I10 Essential (primary) hypertension: Secondary | ICD-10-CM | POA: Diagnosis not present

## 2023-02-17 DIAGNOSIS — R42 Dizziness and giddiness: Secondary | ICD-10-CM

## 2023-02-17 DIAGNOSIS — Z7189 Other specified counseling: Secondary | ICD-10-CM

## 2023-02-17 DIAGNOSIS — R519 Headache, unspecified: Secondary | ICD-10-CM | POA: Diagnosis not present

## 2023-02-17 NOTE — Patient Instructions (Signed)

## 2023-02-17 NOTE — Progress Notes (Signed)
Cardiology Office Note:  .    Date:  02/17/2023  ID:  Tammy Lindsey, DOB Feb 18, 1932, MRN [id number] PCP: Mahlon Gammon, MD  [facility] Providers Cardiologist:  Jodelle Red, MD     History of Present Illness: .    Tammy Lindsey is a 89 y.o. female with a hx of hypertension, breast cancer, osteopenia, and asthma who is seen for follow up today. I initially met her 06/15/22 as a new consult at the request of [place], Amy E, NP for the evaluation and management of hypertension.   Cardiac hx and risk factors: Comorbid conditions: Hypertension. Family history: Her son has A-Fib.  Prior cardiac testing and/or incidental findings on other testing (ie coronary calcium): LE venous duplex, Bilateral carotid duplex Exercise level: She used to like exercising but lately has been unable to.    At her initial visit 05/2022, she reported fluctuating blood pressures. Per her home BP log, she had low readings such as 109/49 and 113/53, but she noted highs up to 160 systolic. Complained of pressure in her head and headaches with high blood pressures. She tends to lie down and do breathing exercising to help. No sickness, pain, or anxiety during the time of these episodes. She also complained of dizziness and fatigue that she attributed to her Hydralazine 25 mg. Amlodipine had been previously discontinued for dizziness/lightheadedness, but her symptoms were unchanged after the medication was stopped. She had felt more lightheaded on hydralazine and dropped the midday dose. We restarted low dose amlodipine, and changed hydralazine from 25 mg TID to 10 mg PRN for systolic BP >160. On 07/19/2022 she was seen by Perlie Gold, PA-C and continued to struggle with fatigue. Atenolol was decreased to 25 mg nightly, and amlodipine was increased to 5 mg daily. Amlodipine was later re-timed to the evenings. At her follow-up with Gillian Shields, NP 07/2022, blood pressure was mildly elevated in clinic with home  average 128/75. Some hypotensive readings with 110s systolic. She noted lightheadedness mostly in the mornings after taking losartan. Losartan was adjusted to 25 mg in the AM, and 50 mg in the PM. Energy levels had improved with reduced dose of atenolol. In 09/2022 Losartan was discontinued and she was switched to olmesartan 40 mg daily. Olmesartan was subsequently reduced to 20 mg in the evening due to morning fatigue and dizziness.   At her visit 10/2022, her blood pressure was well controlled in the office. Per her BP log, in the mornings her blood pressure averaged 110s systolic (lowest 98), and by the afternoons her blood pressure was as high as the 140s systolic. She had not needed to take the hydralazine doses consistently. No dizziness, lightheadedness, or loss of consciousness.   Today, she presents a BP log which is personally reviewed. In the mornings her blood pressure is usually closer to 117-120/50s-90s, but in the evenings her blood pressure has been as high as the 140s systolic which is when she starts feeling unwell including dizziness. At one time her blood pressure was in the 160s, subsequently improved with taking her prn hydralazine. However, at other times she is unsure of how/when to take the prn hydralazine, especially when she will be taking her next doses within a couple hours.  Additionally, she complains of bilateral pain/pressure in her temples with radiation inferiorly along the sides of her face. This has been ongoing persistently for quite a while. For treatment she may take tylenol once a day, but dislikes taking it every day.  Occasionally notices eye floaters in the sides of her vision.  For a long time she has continued to struggle with imbalance, feeling unsure of her footing.   She complains of night sweats as a side effect of anastrozole.   She also notes irregular bowel movements. One day she may be constipated, and the next day she may struggle with diarrhea.  She  denies any palpitations, chest pain, shortness of breath, peripheral edema, syncope, orthopnea, or PND. No fevers.   ROS:  Please see the history of present illness. ROS otherwise negative except as noted.  (+) Dizziness (+) Headaches (+) Imbalance, gait instability (+) Night sweats (+) Constipation/diarrhea  Studies Reviewed: Marland Kitchen          Physical Exam:    VS:  BP (!) 110/55 Comment: home  Pulse 76   Ht 5\' 2"  (1.575 m)   Wt 117 lb (53.1 kg)   SpO2 99%   BMI 21.40 kg/m    Wt Readings from Last 3 Encounters:  02/17/23 117 lb (53.1 kg)  11/21/22 122 lb 4.8 oz (55.5 kg)  11/15/22 122 lb (55.3 kg)    GEN: Well nourished, well developed in no acute distress HEENT: Normal, moist mucous membranes NECK: No JVD CARDIAC: regular rhythm, normal S1 and S2, no rubs or gallops. No murmur. VASCULAR: Radial and DP pulses 2+ bilaterally. No carotid bruits RESPIRATORY:  Clear to auscultation without rales, wheezing or rhonchi  ABDOMEN: Soft, non-tender, non-distended MUSCULOSKELETAL:  Ambulates independently. Tight temporal muscles but no tenderness, redness, warmth over temporal arteries SKIN: Warm and dry, no edema NEUROLOGIC:  Alert and oriented x 3. No focal neuro deficits noted. PSYCHIATRIC:  Normal affect   ASSESSMENT AND PLAN: .    Headaches -constant, bilateral, better with tylenol. Not changing based on her Bps. Does not appear to be related to medication administration -no vision changes, tenderness over temporal arteries bilaterally -defer to PCP, does not appear due to cardiac meds or vascular issue  Hypertension Intermittent lightheadedness -has been stable for months, with good blood pressures at home. Rare lability, needs hydralazine about once a month. Lightheadedness improved with lower dose of atenolol. Continue current regimen.   Counseling on prevention Encouraged activity, especially walking. She does well with this. Discussed activity goals.  Dispo: Follow-up in  6 months, or sooner as needed.  I,Mathew Stumpf,acting as a Neurosurgeon for [employer], MD.,have documented all relevant documentation on the behalf of Jodelle Red, MD,as directed by  Jodelle Red, MD while in the presence of Jodelle Red, MD.  I, Jodelle Red, MD, have reviewed all documentation for this visit. The documentation on 02/17/23 for the exam, diagnosis, procedures, and orders are all accurate and complete.   Signed, Jodelle Red, MD

## 2023-02-19 ENCOUNTER — Encounter: Payer: Self-pay | Admitting: Internal Medicine

## 2023-02-19 DIAGNOSIS — R200 Anesthesia of skin: Secondary | ICD-10-CM

## 2023-02-19 DIAGNOSIS — R42 Dizziness and giddiness: Secondary | ICD-10-CM

## 2023-02-20 ENCOUNTER — Other Ambulatory Visit: Payer: Self-pay | Admitting: Internal Medicine

## 2023-02-20 ENCOUNTER — Other Ambulatory Visit (HOSPITAL_BASED_OUTPATIENT_CLINIC_OR_DEPARTMENT_OTHER): Payer: Self-pay

## 2023-02-20 MED ORDER — HYDROCODONE BIT-HOMATROP MBR 5-1.5 MG/5ML PO SOLN
5.0000 mL | Freq: Every evening | ORAL | 0 refills | Status: DC | PRN
  Filled 2023-02-20: qty 240, 48d supply, fill #0

## 2023-02-21 ENCOUNTER — Other Ambulatory Visit: Payer: Self-pay

## 2023-02-21 ENCOUNTER — Other Ambulatory Visit (HOSPITAL_BASED_OUTPATIENT_CLINIC_OR_DEPARTMENT_OTHER): Payer: Self-pay

## 2023-02-22 ENCOUNTER — Other Ambulatory Visit: Payer: Self-pay | Admitting: Internal Medicine

## 2023-02-22 DIAGNOSIS — R200 Anesthesia of skin: Secondary | ICD-10-CM

## 2023-02-22 DIAGNOSIS — R42 Dizziness and giddiness: Secondary | ICD-10-CM

## 2023-02-27 ENCOUNTER — Telehealth: Payer: Self-pay | Admitting: Neurology

## 2023-02-27 NOTE — Telephone Encounter (Signed)
Pt having continuous numbness in feet, starting right above the ankle; worth when in bed, Lightheadedness comes and goes. Month ago saw PCP and she suggested I see my neurologist. Would like a call from the nurse to discuss a sooner appt than 06/2023

## 2023-02-27 NOTE — Telephone Encounter (Signed)
I called pt back.  I relayed that per Dr. Lucia Gaskins last notes, that pt had been worked up for this and could not find cause back in 07/2022 "Hi Jo, I already evaluated you for the symptoms in your feet and I could not find an answer why. All the blood work I did for cause of the symptoms in your feet were negative. Sometimes we just do not know why people get symptoms like this in the feet and you need to be treated for your symptoms to make you more comfortable and your primary care can do that.  You should see your primary care. I hope you feel better!"    I relayed that if she already had appt in March 2025 would keep that, but did not have anything earlier to offer.  If her pcp wanted to have her see other neuro for opinion then she could refer.  Pt said she felt she was being passed around.  I relayed was sorry she felt that way. No answer on the other line.

## 2023-03-02 ENCOUNTER — Encounter: Payer: Self-pay | Admitting: Oncology

## 2023-03-22 ENCOUNTER — Inpatient Hospital Stay: Payer: PPO | Admitting: Oncology

## 2023-03-25 ENCOUNTER — Other Ambulatory Visit (HOSPITAL_BASED_OUTPATIENT_CLINIC_OR_DEPARTMENT_OTHER): Payer: Self-pay

## 2023-03-27 ENCOUNTER — Inpatient Hospital Stay: Payer: PPO | Attending: Oncology | Admitting: Oncology

## 2023-03-27 VITALS — BP 116/65 | HR 76 | Temp 98.1°F | Resp 18 | Ht 62.0 in | Wt 117.3 lb

## 2023-03-27 DIAGNOSIS — D0512 Intraductal carcinoma in situ of left breast: Secondary | ICD-10-CM | POA: Diagnosis not present

## 2023-03-27 DIAGNOSIS — Z170 Estrogen receptor positive status [ER+]: Secondary | ICD-10-CM | POA: Insufficient documentation

## 2023-03-27 DIAGNOSIS — K219 Gastro-esophageal reflux disease without esophagitis: Secondary | ICD-10-CM | POA: Diagnosis not present

## 2023-03-27 NOTE — Progress Notes (Signed)
  [facility] OFFICE PROGRESS NOTE   Diagnosis: DCIS  INTERVAL HISTORY:   [name] returns as scheduled.  She continues anastrozole.  She has night sweats every few weeks.  Good appetite.  No palpable change over either breast.  She has not had a mammogram in 2024.  She has noted a fullness in the submandibular area bilaterally  Objective:  Vital signs in last 24 hours:  Blood pressure 116/65, pulse 76, temperature 98.1 F (36.7 C), temperature source Temporal, resp. rate 18, height 5\' 2"  (1.575 m), weight 117 lb 4.8 oz (53.2 kg), SpO2 99%.    HEENT: Symmetric submandibular glands Lymphatics: No cervical, supraclavicular, or axillary nodes Resp: Lungs clear bilaterally Cardio: Regular rate and rhythm GI: No hepatosplenomegaly, no mass, nontender Vascular: No leg edema Breast: No mass in either breast.  No evidence for recurrent tumor at the left areola.   Lab Results:  Lab Results  Component Value Date   WBC 8.4 02/03/2022   HGB 14.4 02/03/2022   HCT 41.9 02/03/2022   MCV 81.2 02/03/2022   PLT 331 02/03/2022   NEUTROABS 4.9 02/03/2022    CMP  Lab Results  Component Value Date   NA 137 11/08/2022   K 4.7 11/08/2022   CL 101 11/08/2022   CO2 21 11/08/2022   GLUCOSE 93 11/08/2022   BUN 13 11/08/2022   CREATININE 0.73 11/08/2022   CALCIUM 9.6 11/08/2022   PROT 6.4 06/08/2022   ALBUMIN 4.4 11/24/2021   AST 12 (L) 11/24/2021   ALT 11 11/24/2021   ALKPHOS 54 11/24/2021   BILITOT 0.5 11/24/2021   GFRNONAA >60 02/03/2022   GFRAA  07/24/2010    >60        The eGFR has been calculated using the MDRD equation. This calculation has not been validated in all clinical situations. eGFR's persistently <60 mL/min signify possible Chronic Kidney Disease.    Medications: I have reviewed the patient's current medications.   Assessment/Plan: Left breast DCIS Screening mammogram 11/04/2021-calcifications in the central left breast Diagnostic left  mammogram 719 2023-1.3 cm group of calcifications in the left breast suspicious for malignancy Stereotactic biopsy 11/16/2021-DCIS, intermediate nuclear grade, 0.6 cm, necrosis present, calcifications present, ER 100%, PR 70% Left lumpectomy 12/07/2021-small residual focus of DCIS in vicinity of biopsy site, negative margins.  All margins negative for DCIS by at least 10 mm Letrozole 12/20/2021 Anastrozole 08/02/2022 Gastroesophageal reflux Chronic nonproductive cough C. difficile January 2023 Family history of breast cancer-negative genetic testing    Disposition: Tammy Lindsey remains in clinical remission from breast cancer.  She is tolerating the anastrozole well.  She will continue anastrozole.  I encouraged her to obtain a follow-up mammogram.  She will return for an office visit in 1 year.  Thornton Papas, MD  03/27/2023  12:14 PM

## 2023-04-04 ENCOUNTER — Encounter: Payer: Self-pay | Admitting: Internal Medicine

## 2023-04-04 ENCOUNTER — Non-Acute Institutional Stay: Payer: PPO | Admitting: Internal Medicine

## 2023-04-04 VITALS — BP 132/68 | HR 74 | Temp 97.6°F | Resp 16 | Ht 62.0 in | Wt 120.0 lb

## 2023-04-04 DIAGNOSIS — G629 Polyneuropathy, unspecified: Secondary | ICD-10-CM

## 2023-04-04 DIAGNOSIS — R61 Generalized hyperhidrosis: Secondary | ICD-10-CM | POA: Diagnosis not present

## 2023-04-04 DIAGNOSIS — K219 Gastro-esophageal reflux disease without esophagitis: Secondary | ICD-10-CM

## 2023-04-04 DIAGNOSIS — R252 Cramp and spasm: Secondary | ICD-10-CM

## 2023-04-04 DIAGNOSIS — R053 Chronic cough: Secondary | ICD-10-CM | POA: Diagnosis not present

## 2023-04-04 DIAGNOSIS — F5101 Primary insomnia: Secondary | ICD-10-CM

## 2023-04-04 DIAGNOSIS — I10 Essential (primary) hypertension: Secondary | ICD-10-CM | POA: Diagnosis not present

## 2023-04-04 NOTE — Progress Notes (Signed)
Location:  [facility] features editor of Service:  Clinic (12)  Provider:   Code Status: DNR Goals of Care:     04/04/2023    9:21 AM  Advanced Directives  Does Patient Have a Medical Advance Directive? Yes  Type of Estate agent of [place];Living will  Does patient want to make changes to medical advance directive? No - Patient declined  Copy of Healthcare Power of Attorney in Chart? Yes - validated most recent copy scanned in chart (See row information)     Chief Complaint  Patient presents with   Medical Management of Chronic Issues    Patient is being seen for a 6 month follow up    Immunizations    Patient is due for a covid vaccine     HPI: Patient is a 89 y.o. female seen today for medical management of chronic diseases.   [place] in [place] in [place]  Hypertension Doing well Most of her BP are from 110-130 Some Dizziness but overall stable Carotid Stenosis Right ICA more then 50 % Cough has seen Pulmonary and Allergy Uses Hydrocodone syrup Prn  Constipation uses senna PRN Night sweats doing okk Occasional  Night muscle cramps occasional   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   Diverticulosis    Esophageal reflux    Family history of breast cancer 11/25/2021   Hiatal hernia    Hx of colonic polyps    Hypertension    Per new patient packet   Mild asthma    Per new patient packet   Osteoarthritis    Osteopenia    bone density 1-09 and 2-13   Pulmonary nodules    scattered-CT Scan July 2010, 12-10, and 04-2010-all stable felt benign     Past Surgical History:  Procedure Laterality Date   BREAST LUMPECTOMY WITH RADIOACTIVE SEED LOCALIZATION Left 12/07/2021   Procedure: LEFT BREAST LUMPECTOMY WITH RADIOACTIVE SEED LOCALIZATION;  Surgeon: Griselda Miner, MD;  Location: [facility];  Service: General;  Laterality: Left;   CHOLECYSTECTOMY  04/25/1972   Gall  Bladder; [facility]   DG PORTABLE CHEST X RAY ([facility] HX)  2023   Shoulder   DIAGNOSTIC MAMMOGRAM  2022   [facility]; Per new patient packet   JOINT REPLACEMENT Left 08/24/2010   JOINT REPLACEMENT Left 04/25/2010   Reverse    orthroscopic  Rotator Cuff Left 04/25/2009   Partial shoulder Left 04/26/2003   ROTATOR CUFF REPAIR     x 2 1998 and 1999   SPINE SURGERY  04/26/1983   disk   TONSILLECTOMY  04/25/1950   Dr. Elonda Husky; Per new patient packet    Allergies  Allergen Reactions   Sulfa Antibiotics     Outpatient Encounter Medications as of 04/04/2023  Medication Sig   acetaminophen (TYLENOL) 500 MG tablet Take 500 mg by mouth as needed.   amLODipine (NORVASC) 5 MG tablet Take 1 tablet (5 mg total) by mouth daily with breakfast.   anastrozole (ARIMIDEX) 1 MG tablet Take 1 tablet (1 mg total) by mouth daily.   atenolol (TENORMIN) 25 MG tablet Take 1 tablet (25 mg total) by mouth at bedtime.   Cholecalciferol (D3 PO) Take 250 mcg by mouth daily.   cholestyramine (QUESTRAN) 4 g packet Use 1 packet mixed with water or non-carbonated drink by mouth once daily   Cyanocobalamin (B-12) 1000 MCG TABS Take by mouth daily.   diclofenac  Sodium (VOLTAREN) 1 % GEL Apply topically as needed.   hydrALAZINE (APRESOLINE) 10 MG tablet Take 10 mg by mouth as needed (for blood pressure 150 or above).   HYDROcodone bit-homatropine (HYCODAN) 5-1.5 MG/5ML syrup Take 5 mLs by mouth at bedtime as needed for cough.   mirtazapine (REMERON) 15 MG tablet Take 0.5 tablets (7.5 mg total) by mouth at bedtime as needed for sleep.   olmesartan (BENICAR) 20 MG tablet Take 1 tablet (20 mg total) by mouth daily.   pantoprazole (PROTONIX) 40 MG tablet Take 1 tablet (40 mg total) by mouth daily.   No facility-administered encounter medications on file as of 04/04/2023.    Review of Systems:  Review of Systems  Constitutional:  Negative for activity change and appetite change.  HENT: Negative.    Respiratory:   Positive for cough. Negative for shortness of breath.   Cardiovascular:  Negative for leg swelling.  Gastrointestinal:  Negative for constipation.  Genitourinary:  Positive for frequency.  Musculoskeletal:  Negative for arthralgias, gait problem and myalgias.  Skin: Negative.   Neurological:  Negative for dizziness and weakness.  Psychiatric/Behavioral:  Negative for confusion, dysphoric mood and sleep disturbance.     Health Maintenance  Topic Date Due   COVID-19 Vaccine (6 - 2023-24 season) 02/28/2023   Medicare Annual Wellness (AWV)  11/14/2023   DTaP/Tdap/Td (2 - Td or Tdap) 06/13/2032   Pneumonia Vaccine 77+ Years old  Completed   INFLUENZA VACCINE  Completed   DEXA SCAN  Completed   Zoster Vaccines- Shingrix  Completed   HPV VACCINES  Aged Out    Physical Exam: Vitals:   04/04/23 0920  BP: 132/68  Pulse: 74  Resp: 16  Temp: 97.6 F (36.4 C)  TempSrc: Temporal  SpO2: 97%  Weight: 120 lb (54.4 kg)  Height: 5\' 2"  (1.575 m)   Body mass index is 21.95 kg/m. Physical Exam Vitals reviewed.  Constitutional:      Appearance: Normal appearance.  HENT:     Head: Normocephalic.     Nose: Nose normal.     Mouth/Throat:     Mouth: Mucous membranes are moist.     Pharynx: Oropharynx is clear.  Eyes:     Pupils: Pupils are equal, round, and reactive to light.  Cardiovascular:     Rate and Rhythm: Normal rate and regular rhythm.     Pulses: Normal pulses.     Heart sounds: Normal heart sounds. No murmur heard. Pulmonary:     Effort: Pulmonary effort is normal.     Breath sounds: Normal breath sounds.  Abdominal:     General: Abdomen is flat. Bowel sounds are normal.     Palpations: Abdomen is soft.  Musculoskeletal:        General: No swelling.     Cervical back: Neck supple.  Skin:    General: Skin is warm.  Neurological:     General: No focal deficit present.     Mental Status: She is alert and oriented to person, place, and time.  Psychiatric:        Mood  and Affect: Mood normal.        Thought Content: Thought content normal.     Labs reviewed: Basic Metabolic Panel: Recent Labs    06/08/22 1231 11/03/22 0846 11/08/22 0839  NA  --  135 137  K  --  5.3* 4.7  CL  --  99 101  CO2  --  22 21  GLUCOSE  --  83 93  BUN  --  19 13  CREATININE  --  0.84 0.73  CALCIUM  --  9.3 9.6  TSH 0.798  --   --    Liver Function Tests: Recent Labs    06/08/22 1224  PROT 6.4   No results for input(s): "LIPASE", "AMYLASE" in the last 8760 hours. No results for input(s): "AMMONIA" in the last 8760 hours. CBC: No results for input(s): "WBC", "NEUTROABS", "HGB", "HCT", "MCV", "PLT" in the last 8760 hours. Lipid Panel: No results for input(s): "CHOL", "HDL", "LDLCALC", "TRIG", "CHOLHDL", "LDLDIRECT" in the last 8760 hours. Lab Results  Component Value Date   HGBA1C 5.5 06/08/2022    Procedures since last visit: No results found.  Assessment/Plan 1. Primary hypertension BP stable Follows with Cardiology On Norvasc, tenormin,and Benicar  2. Chronic cough Uses Hydrocodone Syrup Prn Has been through work up negative so far  3. Night sweats Manageable  4. Neuropathy Stable  5. Muscle cramps Do Stretching   6. Gastroesophageal reflux disease, unspecified whether esophagitis present Protonix  7. Primary insomnia Remeron PRN 9  Ductal carcinoma in situ (DCIS) of left breast  On Anastrozole   Labs/tests ordered:  CBC,CMP,TSH,Lipid next week Next appt:  Visit date not found

## 2023-04-13 ENCOUNTER — Other Ambulatory Visit: Payer: Self-pay | Admitting: Internal Medicine

## 2023-04-13 ENCOUNTER — Other Ambulatory Visit (HOSPITAL_BASED_OUTPATIENT_CLINIC_OR_DEPARTMENT_OTHER): Payer: Self-pay

## 2023-04-13 ENCOUNTER — Other Ambulatory Visit: Payer: Self-pay

## 2023-04-13 ENCOUNTER — Encounter (HOSPITAL_BASED_OUTPATIENT_CLINIC_OR_DEPARTMENT_OTHER): Payer: Self-pay | Admitting: Pharmacist

## 2023-04-13 DIAGNOSIS — E039 Hypothyroidism, unspecified: Secondary | ICD-10-CM | POA: Diagnosis not present

## 2023-04-13 LAB — BASIC METABOLIC PANEL WITH GFR
BUN: 17 (ref 4–21)
CO2: 23 — AB (ref 13–22)
Chloride: 99 (ref 99–108)
Creatinine: 0.8 (ref 0.5–1.1)
Glucose: 76
Potassium: 4.7 meq/L (ref 3.5–5.1)
Sodium: 135 — AB (ref 137–147)

## 2023-04-13 LAB — CBC AND DIFFERENTIAL
HCT: 35 — AB (ref 36–46)
Hemoglobin: 12.1 (ref 12.0–16.0)
Platelets: 312 10*3/uL (ref 150–400)
WBC: 6.9

## 2023-04-13 LAB — LIPID PANEL
Cholesterol: 136 (ref 0–200)
HDL: 57 (ref 35–70)
LDL Cholesterol: 63
Triglycerides: 83 (ref 40–160)

## 2023-04-13 LAB — HEPATIC FUNCTION PANEL
ALT: 12 U/L (ref 7–35)
AST: 19 (ref 13–35)
Alkaline Phosphatase: 55 (ref 25–125)

## 2023-04-13 LAB — COMPREHENSIVE METABOLIC PANEL WITH GFR
Albumin: 4.2 (ref 3.5–5.0)
Calcium: 9.3 (ref 8.7–10.7)

## 2023-04-13 LAB — CBC: RBC: 4.22 (ref 3.87–5.11)

## 2023-04-13 LAB — TSH: TSH: 1.35 (ref 0.41–5.90)

## 2023-04-13 MED ORDER — HYDROCODONE BIT-HOMATROP MBR 5-1.5 MG/5ML PO SOLN
5.0000 mL | Freq: Every evening | ORAL | 0 refills | Status: DC | PRN
  Filled 2023-04-13: qty 240, 48d supply, fill #0

## 2023-04-13 NOTE — Telephone Encounter (Signed)
Patient is requesting a refill of the following medications: Requested Prescriptions   Pending Prescriptions Disp Refills   HYDROcodone bit-homatropine (HYCODAN) 5-1.5 MG/5ML syrup 240 mL 0    Sig: Take 5 mLs by mouth at bedtime as needed for cough.    Date of last refill:02/20/2023  Refill amount:  Treatment agreement date: bn/A

## 2023-04-14 ENCOUNTER — Other Ambulatory Visit (HOSPITAL_BASED_OUTPATIENT_CLINIC_OR_DEPARTMENT_OTHER): Payer: Self-pay

## 2023-04-17 ENCOUNTER — Encounter: Payer: Self-pay | Admitting: Internal Medicine

## 2023-04-20 ENCOUNTER — Encounter: Payer: Self-pay | Admitting: Internal Medicine

## 2023-04-24 ENCOUNTER — Encounter: Payer: Self-pay | Admitting: Internal Medicine

## 2023-04-24 NOTE — Telephone Encounter (Signed)
Message routed to [facility] staff.

## 2023-04-27 ENCOUNTER — Encounter (HOSPITAL_BASED_OUTPATIENT_CLINIC_OR_DEPARTMENT_OTHER): Payer: Self-pay

## 2023-04-27 ENCOUNTER — Other Ambulatory Visit (HOSPITAL_BASED_OUTPATIENT_CLINIC_OR_DEPARTMENT_OTHER): Payer: Self-pay

## 2023-04-28 NOTE — Telephone Encounter (Signed)
Message routed to PCP Gupta, Anjali L, MD  

## 2023-04-28 NOTE — Telephone Encounter (Signed)
 Message routed back to PCP Mahlon Gammon, MD

## 2023-05-01 ENCOUNTER — Other Ambulatory Visit: Payer: Self-pay | Admitting: Internal Medicine

## 2023-05-01 DIAGNOSIS — Z1231 Encounter for screening mammogram for malignant neoplasm of breast: Secondary | ICD-10-CM

## 2023-05-03 ENCOUNTER — Telehealth: Payer: Self-pay

## 2023-05-03 NOTE — Telephone Encounter (Signed)
 Message left on clinical intake voicemail:   Patient called to check the status of her mammogram order as she has not heard anything about scheduling.

## 2023-05-07 ENCOUNTER — Other Ambulatory Visit: Payer: Self-pay | Admitting: Oncology

## 2023-05-08 ENCOUNTER — Other Ambulatory Visit: Payer: Self-pay

## 2023-05-08 ENCOUNTER — Other Ambulatory Visit (HOSPITAL_BASED_OUTPATIENT_CLINIC_OR_DEPARTMENT_OTHER): Payer: Self-pay

## 2023-05-08 MED ORDER — ANASTROZOLE 1 MG PO TABS
1.0000 mg | ORAL_TABLET | Freq: Every day | ORAL | 5 refills | Status: DC
  Filled 2023-05-08: qty 30, 30d supply, fill #0
  Filled 2023-06-04: qty 30, 30d supply, fill #1
  Filled 2023-07-04: qty 30, 30d supply, fill #2
  Filled 2023-08-10: qty 30, 30d supply, fill #3

## 2023-05-09 ENCOUNTER — Other Ambulatory Visit (HOSPITAL_BASED_OUTPATIENT_CLINIC_OR_DEPARTMENT_OTHER): Payer: Self-pay

## 2023-05-09 ENCOUNTER — Encounter (HOSPITAL_BASED_OUTPATIENT_CLINIC_OR_DEPARTMENT_OTHER): Payer: Self-pay | Admitting: Radiology

## 2023-05-09 ENCOUNTER — Ambulatory Visit (HOSPITAL_BASED_OUTPATIENT_CLINIC_OR_DEPARTMENT_OTHER)
Admission: RE | Admit: 2023-05-09 | Discharge: 2023-05-09 | Disposition: A | Discharge status: Home / Self Care | Payer: PPO | Source: Ambulatory Visit | Attending: Internal Medicine | Admitting: Internal Medicine

## 2023-05-09 DIAGNOSIS — Z1231 Encounter for screening mammogram for malignant neoplasm of breast: Secondary | ICD-10-CM | POA: Diagnosis not present

## 2023-05-13 ENCOUNTER — Encounter: Payer: Self-pay | Admitting: Oncology

## 2023-05-13 ENCOUNTER — Encounter: Payer: Self-pay | Admitting: Internal Medicine

## 2023-05-21 ENCOUNTER — Other Ambulatory Visit: Payer: Self-pay | Admitting: Internal Medicine

## 2023-05-22 ENCOUNTER — Other Ambulatory Visit (HOSPITAL_BASED_OUTPATIENT_CLINIC_OR_DEPARTMENT_OTHER): Payer: Self-pay

## 2023-05-22 ENCOUNTER — Other Ambulatory Visit: Payer: Self-pay

## 2023-05-22 DIAGNOSIS — L570 Actinic keratosis: Secondary | ICD-10-CM | POA: Diagnosis not present

## 2023-05-22 DIAGNOSIS — L723 Sebaceous cyst: Secondary | ICD-10-CM | POA: Diagnosis not present

## 2023-05-22 DIAGNOSIS — Z85828 Personal history of other malignant neoplasm of skin: Secondary | ICD-10-CM | POA: Diagnosis not present

## 2023-05-22 MED ORDER — HYDROCODONE BIT-HOMATROP MBR 5-1.5 MG/5ML PO SOLN
5.0000 mL | Freq: Every evening | ORAL | 0 refills | Status: DC | PRN
  Filled 2023-05-22: qty 240, 48d supply, fill #0

## 2023-05-22 NOTE — Telephone Encounter (Signed)
Medication pend and sent.

## 2023-05-23 ENCOUNTER — Ambulatory Visit (HOSPITAL_BASED_OUTPATIENT_CLINIC_OR_DEPARTMENT_OTHER): Payer: PPO | Admitting: Cardiology

## 2023-06-04 ENCOUNTER — Encounter: Payer: Self-pay | Admitting: Internal Medicine

## 2023-06-05 ENCOUNTER — Encounter: Payer: PPO | Admitting: Adult Health

## 2023-06-06 ENCOUNTER — Encounter: Payer: Self-pay | Admitting: Internal Medicine

## 2023-06-06 ENCOUNTER — Other Ambulatory Visit (HOSPITAL_BASED_OUTPATIENT_CLINIC_OR_DEPARTMENT_OTHER): Payer: Self-pay

## 2023-06-06 ENCOUNTER — Non-Acute Institutional Stay: Payer: PPO | Admitting: Internal Medicine

## 2023-06-06 VITALS — BP 138/68 | HR 73 | Temp 97.8°F | Resp 18 | Wt 117.4 lb

## 2023-06-06 DIAGNOSIS — R053 Chronic cough: Secondary | ICD-10-CM | POA: Diagnosis not present

## 2023-06-06 DIAGNOSIS — R42 Dizziness and giddiness: Secondary | ICD-10-CM

## 2023-06-06 DIAGNOSIS — I10 Essential (primary) hypertension: Secondary | ICD-10-CM | POA: Diagnosis not present

## 2023-06-06 DIAGNOSIS — W19XXXA Unspecified fall, initial encounter: Secondary | ICD-10-CM

## 2023-06-06 NOTE — Progress Notes (Unsigned)
 Location: [facility] features editor of Service:  Clinic (12)  Provider:   Code Status:  Goals of Care:     06/06/2023    3:58 PM  Advanced Directives  Does Patient Have a Medical Advance Directive? Yes  Type of Advance Directive Living will;Healthcare Power of [place];Out of facility DNR (pink MOST or yellow form)  Copy of Healthcare Power of Attorney in Chart? No - copy requested     Chief Complaint  Patient presents with   Dizziness    Dizziness. Discuss the need for covid    HPI: Patient is a 89 y.o. female seen today for an acute visit for Dizziness after fall  Discussed the use of AI scribe software for clinical note transcription with the patient, who gave verbal consent to proceed.  History of Present Illness   Tammy Lindsey "[name]" is a 89 year old female who presents with concerns following a fall and subsequent head injury.  Approximately one week ago, she experienced a fall while moving into a new apartment. She turned quickly after hearing a noise, lost her balance, and fell, hitting the side of her head. Since the fall, she has noticed some blurred vision and occasional headaches described as 'little tiny feeling in the temples.' No immediate symptoms such as nausea were present at the time of the fall. She experiences slight dizziness occasionally, which is not new and has occurred prior to the fall. She confirms that she has been walking okay since the incident. No current headaches or nausea are reported.  She has a persistent cough that she describes as having 'been forever.' She recalls seeing a pulmonologist, Dr. Sandra Cockayne, who advised against using hydrocodone syrup due to concerns about driving. The cough was thought to be something she was 'born with.'  She experiences night sweats intermittently, occurring for a couple of nights and then subsiding for weeks. She is uncertain if this is related to her medication, as there was a time when  she felt better off it.      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   Diverticulosis    Esophageal reflux    Family history of breast cancer 11/25/2021   Hiatal hernia    Hx of colonic polyps    Hypertension    Per new patient packet   Mild asthma    Per new patient packet   Osteoarthritis    Osteopenia    bone density 1-09 and 2-13   Pulmonary nodules    scattered-CT Scan July 2010, 12-10, and 04-2010-all stable felt benign     Past Surgical History:  Procedure Laterality Date   BREAST LUMPECTOMY WITH RADIOACTIVE SEED LOCALIZATION Left 12/07/2021   Procedure: LEFT BREAST LUMPECTOMY WITH RADIOACTIVE SEED LOCALIZATION;  Surgeon: Griselda Miner, MD;  Location: [facility];  Service: General;  Laterality: Left;   CHOLECYSTECTOMY  04/25/1972   Gall Bladder; [facility]   DG PORTABLE CHEST X RAY ([facility] HX)  2023   Shoulder   DIAGNOSTIC MAMMOGRAM  2022   [facility]; Per new  patient packet   JOINT REPLACEMENT Left 08/24/2010   JOINT REPLACEMENT Left 04/25/2010   Reverse    orthroscopic  Rotator Cuff Left 04/25/2009   Partial shoulder Left 04/26/2003   ROTATOR CUFF REPAIR     x 2 1998 and 1999   SPINE SURGERY  04/26/1983   disk   TONSILLECTOMY  04/25/1950   Dr. Elonda Husky; Per new patient packet    Allergies  Allergen Reactions   Sulfa Antibiotics     Outpatient Encounter Medications as of 06/06/2023  Medication Sig   acetaminophen (TYLENOL) 500 MG tablet Take 500 mg by mouth as needed.   amLODipine (NORVASC) 5 MG tablet Take 1 tablet (5 mg total) by mouth daily with  breakfast.   anastrozole (ARIMIDEX) 1 MG tablet Take 1 tablet (1 mg total) by mouth daily.   atenolol (TENORMIN) 25 MG tablet Take 1 tablet (25 mg total) by mouth at bedtime.   Cholecalciferol (D3 PO) Take 250 mcg by mouth daily.   Cyanocobalamin (B-12) 1000 MCG TABS Take by mouth daily.   diclofenac Sodium (VOLTAREN) 1 % GEL Apply topically as needed.   hydrALAZINE (APRESOLINE) 10 MG tablet Take 10 mg by mouth as needed (for blood pressure 150 or above).   HYDROcodone bit-homatropine (HYCODAN) 5-1.5 MG/5ML syrup Take 5 mLs by mouth at bedtime as needed for cough.   mirtazapine (REMERON) 15 MG tablet Take 0.5 tablets (7.5 mg total) by mouth at bedtime as needed for sleep.   olmesartan (BENICAR) 20 MG tablet Take 1 tablet (20 mg total) by mouth daily.   pantoprazole (PROTONIX) 40 MG tablet Take 1 tablet (40 mg total) by mouth daily.   [DISCONTINUED] cholestyramine (QUESTRAN) 4 g packet Use 1 packet mixed with water or non-carbonated drink by mouth once daily   No facility-administered encounter medications on file as of 06/06/2023.    Review of Systems:  Review of Systems  Constitutional:  Negative for activity change and appetite change.  HENT: Negative.    Respiratory:  Negative for cough and shortness of breath.   Cardiovascular:  Negative for leg swelling.  Gastrointestinal:  Negative for constipation.  Genitourinary: Negative.   Musculoskeletal:  Positive for gait problem. Negative for arthralgias and myalgias.  Skin: Negative.   Neurological:  Positive for dizziness. Negative for weakness.  Psychiatric/Behavioral:  Negative for confusion, dysphoric mood and sleep disturbance.     Health Maintenance  Topic Date Due   COVID-19 Vaccine (6 - 2024-25 season) 02/28/2023   Medicare Annual Wellness (AWV)  11/14/2023   DTaP/Tdap/Td (2 - Td or Tdap) 06/13/2032   Pneumonia Vaccine 76+ Years old  Completed   INFLUENZA VACCINE  Completed   DEXA SCAN  Completed   Zoster Vaccines- Shingrix   Completed   HPV VACCINES  Aged Out    Physical Exam: Vitals:   06/06/23 1553  BP: 138/68  Pulse: 73  Resp: 18  Temp: 97.8 F (36.6 C)  SpO2: 94%  Weight: 117 lb 6.4 oz (53.3 kg)   Body mass index is 21.47 kg/m. Physical Exam Vitals reviewed.  Constitutional:      Appearance: Normal appearance.  HENT:     Head: Normocephalic.     Nose: Nose normal.     Mouth/Throat:     Mouth: Mucous membranes are moist.     Pharynx: Oropharynx is clear.  Eyes:     Pupils: Pupils are equal, round, and reactive to light.  Cardiovascular:     Rate and Rhythm: Normal rate and regular  rhythm.     Pulses: Normal pulses.     Heart sounds: Normal heart sounds. No murmur heard. Pulmonary:     Effort: Pulmonary effort is normal.     Breath sounds: Normal breath sounds.  Abdominal:     General: Abdomen is flat. Bowel sounds are normal.     Palpations: Abdomen is soft.  Musculoskeletal:        General: No swelling.     Cervical back: Neck supple.  Skin:    General: Skin is warm.  Neurological:     General: No focal deficit present.     Mental Status: She is alert and oriented to person, place, and time.     Comments: She feels Dizzy when she stands up but then her gait was normal Does not use Cane or walker   Psychiatric:        Mood and Affect: Mood normal.        Thought Content: Thought content normal.     Labs reviewed: Basic Metabolic Panel: Recent Labs    06/08/22 1231 11/03/22 0846 11/08/22 0839 04/13/23 0000  NA  --  135 137 135*  K  --  5.3* 4.7 4.7  CL  --  99 101 99  CO2  --  22 21 23*  GLUCOSE  --  83 93  --   BUN  --  19 13 17   CREATININE  --  0.84 0.73 0.8  CALCIUM  --  9.3 9.6 9.3  TSH 0.798  --   --  1.35   Liver Function Tests: Recent Labs    06/08/22 1224 04/13/23 0000  AST  --  19  ALT  --  12  ALKPHOS  --  55  PROT 6.4  --   ALBUMIN  --  4.2   No results for input(s): "LIPASE", "AMYLASE" in the last 8760 hours. No results for input(s):  "AMMONIA" in the last 8760 hours. CBC: Recent Labs    04/13/23 0000  WBC 6.9  HGB 12.1  HCT 35*  PLT 312   Lipid Panel: Recent Labs    04/13/23 0000  CHOL 136  HDL 57  LDLCALC 63  TRIG 83   Lab Results  Component Value Date   HGBA1C 5.5 06/08/2022    Procedures since last visit: MM 3D SCREENING MAMMOGRAM BILATERAL BREAST Result Date: 05/16/2023 CLINICAL DATA:  Screening. EXAM: DIGITAL SCREENING BILATERAL MAMMOGRAM WITH TOMOSYNTHESIS AND CAD TECHNIQUE: Bilateral screening digital craniocaudal and mediolateral oblique mammograms were obtained. Bilateral screening digital breast tomosynthesis was performed. The images were evaluated with computer-aided detection. COMPARISON:  Previous exam(s). ACR Breast Density Category b: There are scattered areas of fibroglandular density. FINDINGS: There are no findings suspicious for malignancy. IMPRESSION: No mammographic evidence of malignancy. A result letter of this screening mammogram will be mailed directly to the patient. RECOMMENDATION: Screening mammogram in one year. (Code:SM-B-01Y) BI-RADS CATEGORY  1: Negative. Electronically Signed   By: Amie Portland M.D.   On: 05/16/2023 13:02    Assessment/Plan Assessment and Plan    Fall with head trauma Occurred one week ago. No loss of consciousness, nausea, or vomiting. Reports mild dizziness and blurred vision. Neurological exam normal. -No immediate need for CT scan based on clinical assessment and patient preference. -Advised to rest, hydrate, and limit cognitive activities for another week. Addendum Patient called later and was concerned about her Headaches and not feeling right I have ordered CT scan of her head  Chronic cough Ongoing issue, previously  evaluated by pulmonology. No new changes or concerns reported. -No immediate intervention needed. Patient to consider follow-up with pulmonology after moving.  Intermittent night sweats Ongoing issue, no new changes  reported. -Continue current management.  General Health Maintenance / Followup Plans -Next routine appointment scheduled for April 2025.         Labs/tests ordered:  * No order type specified * Next appt:  08/15/2023

## 2023-06-07 ENCOUNTER — Other Ambulatory Visit: Payer: Self-pay | Admitting: Internal Medicine

## 2023-06-07 ENCOUNTER — Encounter: Payer: Self-pay | Admitting: Internal Medicine

## 2023-06-07 DIAGNOSIS — W19XXXA Unspecified fall, initial encounter: Secondary | ICD-10-CM

## 2023-06-07 DIAGNOSIS — R42 Dizziness and giddiness: Secondary | ICD-10-CM

## 2023-06-07 DIAGNOSIS — S0990XA Unspecified injury of head, initial encounter: Secondary | ICD-10-CM

## 2023-06-07 NOTE — Telephone Encounter (Signed)
Spoke patient this morning to get clarification on wanted a CT Scan from the MyChart message, patient stated that she saw Mahlon Gammon, MD on yesterday and still say that she does not feel like her self.

## 2023-06-09 ENCOUNTER — Encounter: Payer: Self-pay | Admitting: Internal Medicine

## 2023-06-16 ENCOUNTER — Ambulatory Visit (HOSPITAL_BASED_OUTPATIENT_CLINIC_OR_DEPARTMENT_OTHER)
Admission: RE | Admit: 2023-06-16 | Discharge: 2023-06-16 | Disposition: A | Discharge status: Home / Self Care | Payer: PPO | Source: Ambulatory Visit | Attending: Internal Medicine | Admitting: Internal Medicine

## 2023-06-16 DIAGNOSIS — I6523 Occlusion and stenosis of bilateral carotid arteries: Secondary | ICD-10-CM | POA: Diagnosis not present

## 2023-06-16 DIAGNOSIS — W19XXXA Unspecified fall, initial encounter: Secondary | ICD-10-CM | POA: Diagnosis not present

## 2023-06-16 DIAGNOSIS — R9082 White matter disease, unspecified: Secondary | ICD-10-CM | POA: Insufficient documentation

## 2023-06-16 DIAGNOSIS — S0990XA Unspecified injury of head, initial encounter: Secondary | ICD-10-CM | POA: Diagnosis not present

## 2023-06-16 DIAGNOSIS — R42 Dizziness and giddiness: Secondary | ICD-10-CM | POA: Diagnosis not present

## 2023-06-22 ENCOUNTER — Encounter: Payer: Self-pay | Admitting: Internal Medicine

## 2023-06-27 DIAGNOSIS — M5416 Radiculopathy, lumbar region: Secondary | ICD-10-CM | POA: Diagnosis not present

## 2023-07-03 ENCOUNTER — Encounter: Payer: Self-pay | Admitting: Pulmonary Disease

## 2023-07-04 ENCOUNTER — Non-Acute Institutional Stay: Admitting: Internal Medicine

## 2023-07-04 ENCOUNTER — Other Ambulatory Visit (HOSPITAL_BASED_OUTPATIENT_CLINIC_OR_DEPARTMENT_OTHER): Payer: Self-pay

## 2023-07-04 ENCOUNTER — Encounter: Payer: Self-pay | Admitting: Internal Medicine

## 2023-07-04 ENCOUNTER — Other Ambulatory Visit: Payer: Self-pay | Admitting: Internal Medicine

## 2023-07-04 ENCOUNTER — Other Ambulatory Visit: Payer: Self-pay

## 2023-07-04 VITALS — Temp 97.8°F | Ht 62.0 in | Wt 114.6 lb

## 2023-07-04 DIAGNOSIS — I10 Essential (primary) hypertension: Secondary | ICD-10-CM | POA: Diagnosis not present

## 2023-07-04 DIAGNOSIS — G603 Idiopathic progressive neuropathy: Secondary | ICD-10-CM | POA: Diagnosis not present

## 2023-07-04 DIAGNOSIS — R053 Chronic cough: Secondary | ICD-10-CM | POA: Diagnosis not present

## 2023-07-04 DIAGNOSIS — G609 Hereditary and idiopathic neuropathy, unspecified: Secondary | ICD-10-CM | POA: Insufficient documentation

## 2023-07-04 MED ORDER — PANTOPRAZOLE SODIUM 40 MG PO TBEC
40.0000 mg | DELAYED_RELEASE_TABLET | Freq: Every day | ORAL | 3 refills | Status: DC
Start: 2023-07-04 — End: 2024-03-15
  Filled 2023-07-04: qty 30, 30d supply, fill #0
  Filled 2023-08-03: qty 30, 30d supply, fill #1

## 2023-07-04 MED ORDER — GABAPENTIN 100 MG PO CAPS
100.0000 mg | ORAL_CAPSULE | Freq: Every day | ORAL | 3 refills | Status: DC
  Filled 2023-07-04: qty 60, 60d supply, fill #0

## 2023-07-04 NOTE — Progress Notes (Signed)
 Location: [facility] features editor of Service:  Clinic (12)  Provider:   Code Status:  Goals of Care:     07/04/2023    8:52 AM  Advanced Directives  Does Patient Have a Medical Advance Directive? Yes  Type of Estate agent of [place];Living will;Out of facility DNR (pink MOST or yellow form)  Does patient want to make changes to medical advance directive? No - Patient declined  Copy of Healthcare Power of Attorney in Chart? No - copy requested     Chief Complaint  Patient presents with   Medical Management of Chronic Issues    Patient is being seen to discuss some issues.    HPI: Patient is a 89 y.o. female seen today for an acute visit for Neuropathy [place]  H/o Hypertension, Dizziness, Chronic Cough and h/o Breast cancer  Discussed the use of AI scribe software for clinical note transcription with the patient, who gave verbal consent to proceed.  History of Present Illness   The patient, with a history of neuropathy, presents with concerns about increasing numbness in her feet, particularly at night. The numbness is severe enough that she has to prop her feet up on a pillow for relief. The patient also reports experiencing a sharp pain in her leg occasionally, which requires her to get up and move around to alleviate it. She expresses worry about the progression of her condition, fearing that she might lose coordination or that her feet might "fall off".  In addition to the neuropathy, the patient has a persistent cough that has not improved despite treatment from a pulmonologist. She has tried various treatments including nose drops and inhalers, but none have been effective. The patient also reports experiencing night sweats, which she initially thought might be related to a medication she was taking, but this was ruled out by her doctor.  The patient is active, using an exercise machine and participating in weight lifting  with two-pound weights. The patient is planning to move to an apartment in the near future, which she believes will be more suitable for her current health status.      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   Diverticulosis    Esophageal reflux    Family history of breast cancer 11/25/2021   Hiatal hernia    Hx of colonic polyps    Hypertension    Per new patient packet   Mild asthma    Per new patient packet   Osteoarthritis    Osteopenia    bone density 1-09 and 2-13   Pulmonary nodules    scattered-CT Scan July 2010, 12-10, and 04-2010-all stable felt benign     Past Surgical History:  Procedure Laterality Date   BREAST LUMPECTOMY WITH RADIOACTIVE SEED LOCALIZATION Left 12/07/2021   Procedure: LEFT BREAST LUMPECTOMY WITH RADIOACTIVE SEED LOCALIZATION;  Surgeon: Griselda Miner, MD;  Location: [facility];  Service: General;  Laterality: Left;   CHOLECYSTECTOMY  04/25/1972   Gall Bladder; [facility]   DG PORTABLE CHEST X RAY ([facility] HX)  2023   Shoulder   DIAGNOSTIC MAMMOGRAM  2022   [facility]; Per new patient packet   JOINT REPLACEMENT Left 08/24/2010   JOINT REPLACEMENT Left 04/25/2010   Reverse    orthroscopic  Rotator Cuff Left 04/25/2009   Partial shoulder Left 04/26/2003   ROTATOR CUFF REPAIR  x 2 1998 and 1999   SPINE SURGERY  04/26/1983   disk   TONSILLECTOMY  04/25/1950   Dr. Elonda Husky; Per new patient packet    Allergies  Allergen Reactions   Sulfa Antibiotics     Outpatient Encounter Medications as of 07/04/2023  Medication Sig   gabapentin (NEURONTIN) 100 MG capsule Take 1 capsule (100 mg total) by mouth at bedtime.   acetaminophen (TYLENOL) 500 MG tablet Take 500 mg by mouth as needed.   amLODipine (NORVASC) 5 MG tablet Take 1 tablet (5 mg total) by mouth daily with breakfast.   anastrozole (ARIMIDEX) 1 MG tablet Take 1 tablet (1 mg total) by mouth daily.   atenolol (TENORMIN) 25 MG tablet  Take 1 tablet (25 mg total) by mouth at bedtime.   Cholecalciferol (D3 PO) Take 250 mcg by mouth daily.   Cyanocobalamin (B-12) 1000 MCG TABS Take by mouth daily.   diclofenac Sodium (VOLTAREN) 1 % GEL Apply topically as needed.   hydrALAZINE (APRESOLINE) 10 MG tablet Take 10 mg by mouth as needed (for blood pressure 150 or above).   HYDROcodone bit-homatropine (HYCODAN) 5-1.5 MG/5ML syrup Take 5 mLs by mouth at bedtime as needed for cough.   mirtazapine (REMERON) 15 MG tablet Take 0.5 tablets (7.5 mg total) by mouth at bedtime as needed for sleep.   olmesartan (BENICAR) 20 MG tablet Take 1 tablet (20 mg total) by mouth daily.   [DISCONTINUED] pantoprazole (PROTONIX) 40 MG tablet Take 1 tablet (40 mg total) by mouth daily.   No facility-administered encounter medications on file as of 07/04/2023.    Review of Systems:  Review of Systems  Constitutional:  Negative for activity change and appetite change.  HENT: Negative.    Respiratory:  Negative for cough and shortness of breath.   Cardiovascular:  Negative for leg swelling.  Gastrointestinal:  Negative for constipation.  Genitourinary: Negative.   Musculoskeletal:  Negative for arthralgias, gait problem and myalgias.  Skin: Negative.   Neurological:  Positive for numbness. Negative for dizziness and weakness.  Psychiatric/Behavioral:  Negative for confusion, dysphoric mood and sleep disturbance.     Health Maintenance  Topic Date Due   COVID-19 Vaccine (6 - 2024-25 season) 07/18/2023 (Originally 02/28/2023)   Medicare Annual Wellness (AWV)  11/14/2023   DTaP/Tdap/Td (2 - Td or Tdap) 06/13/2032   Pneumonia Vaccine 22+ Years old  Completed   INFLUENZA VACCINE  Completed   DEXA SCAN  Completed   Zoster Vaccines- Shingrix  Completed   HPV VACCINES  Aged Out    Physical Exam: Vitals:   07/04/23 1044  Temp: 97.8 F (36.6 C)  Weight: 114 lb 9.6 oz (52 kg)  Height: 5\' 2"  (1.575 m)   Body mass index is 20.96 kg/m. Physical  Exam Vitals reviewed.  Constitutional:      Appearance: Normal appearance.  HENT:     Head: Normocephalic.     Nose: Nose normal.     Mouth/Throat:     Mouth: Mucous membranes are moist.     Pharynx: Oropharynx is clear.  Eyes:     Pupils: Pupils are equal, round, and reactive to light.  Cardiovascular:     Rate and Rhythm: Normal rate and regular rhythm.     Pulses: Normal pulses.     Heart sounds: Normal heart sounds. No murmur heard. Pulmonary:     Effort: Pulmonary effort is normal.     Breath sounds: Normal breath sounds.  Abdominal:     General: Abdomen is  flat. Bowel sounds are normal.     Palpations: Abdomen is soft.  Musculoskeletal:        General: No swelling.     Cervical back: Neck supple.  Skin:    General: Skin is warm.  Neurological:     General: No focal deficit present.     Mental Status: She is alert and oriented to person, place, and time.  Psychiatric:        Mood and Affect: Mood normal.        Thought Content: Thought content normal.     Labs reviewed: Basic Metabolic Panel: Recent Labs    11/03/22 0846 11/08/22 0839 04/13/23 0000  NA 135 137 135*  K 5.3* 4.7 4.7  CL 99 101 99  CO2 22 21 23*  GLUCOSE 83 93  --   BUN 19 13 17   CREATININE 0.84 0.73 0.8  CALCIUM 9.3 9.6 9.3  TSH  --   --  1.35   Liver Function Tests: Recent Labs    04/13/23 0000  AST 19  ALT 12  ALKPHOS 55  ALBUMIN 4.2   No results for input(s): "LIPASE", "AMYLASE" in the last 8760 hours. No results for input(s): "AMMONIA" in the last 8760 hours. CBC: Recent Labs    04/13/23 0000  WBC 6.9  HGB 12.1  HCT 35*  PLT 312   Lipid Panel: Recent Labs    04/13/23 0000  CHOL 136  HDL 57  LDLCALC 63  TRIG 83   Lab Results  Component Value Date   HGBA1C 5.5 06/08/2022    Procedures since last visit: CT HEAD WO CONTRAST ( ) Result Date: 06/28/2023 CLINICAL DATA:  Head trauma. Fall 1 week ago. Chronic back head. Patient states that she does not feel  right. EXAM: CT HEAD WITHOUT CONTRAST TECHNIQUE: Contiguous axial images were obtained from the base of the skull through the vertex without intravenous contrast. RADIATION DOSE REDUCTION: This exam was performed according to the departmental dose-optimization program which includes automated exposure control, adjustment of the mA and/or kV according to patient size and/or use of iterative reconstruction technique. COMPARISON:  MR head without and with contrast 08/05/2022. FINDINGS: Brain: No acute infarct, hemorrhage, or mass lesion is present. Mild periventricular white matter changes are stable. Deep brain nuclei are within normal limits. The ventricles are of normal size. No significant extraaxial fluid collection is present. The brainstem and cerebellum are within normal limits. Midline structures are within normal limits. Vascular: Atherosclerotic calcifications are present within the cavernous internal carotid arteries bilaterally. No hyperdense vessel is present. Skull: Calvarium is intact. No focal lytic or blastic lesions are present. No significant extracranial soft tissue lesion is present. Sinuses/Orbits: The paranasal sinuses and mastoid air cells are clear. Bilateral lens replacements are noted. Globes and orbits are otherwise unremarkable. IMPRESSION: 1. No acute intracranial abnormality or significant interval change. 2. Stable mild periventricular white matter disease. This likely reflects the sequela of chronic microvascular ischemia. Electronically Signed   By: Marin Roberts M.D.   On: 06/28/2023 15:09    Assessment/Plan 1. Idiopathic progressive neuropathy (Primary) Has seen Dr Lucia Gaskins Per her note it is Idiopathic Neuropathy Control Symptoms Will Try Gabapentin 100 mg QHS  2. Primary hypertension Doing well on Norvasc,Benicar, and Tenormin  3. Chronic cough Per Pulmonary no more work up On Protonix And Hydrocodone syrup    Labs/tests ordered:  * No order type specified  * Next appt:  07/04/2023

## 2023-07-06 ENCOUNTER — Other Ambulatory Visit (HOSPITAL_COMMUNITY): Payer: Self-pay

## 2023-07-06 ENCOUNTER — Other Ambulatory Visit (HOSPITAL_BASED_OUTPATIENT_CLINIC_OR_DEPARTMENT_OTHER): Payer: Self-pay | Admitting: Cardiology

## 2023-07-06 ENCOUNTER — Other Ambulatory Visit: Payer: Self-pay

## 2023-07-06 MED ORDER — ATENOLOL 25 MG PO TABS
25.0000 mg | ORAL_TABLET | Freq: Every day | ORAL | 2 refills | Status: DC
  Filled 2023-07-06: qty 90, 90d supply, fill #0

## 2023-07-24 ENCOUNTER — Ambulatory Visit: Admitting: Adult Health

## 2023-07-24 ENCOUNTER — Other Ambulatory Visit (HOSPITAL_BASED_OUTPATIENT_CLINIC_OR_DEPARTMENT_OTHER): Payer: Self-pay

## 2023-07-24 ENCOUNTER — Encounter: Payer: Self-pay | Admitting: Adult Health

## 2023-07-24 VITALS — BP 124/68 | HR 65 | Temp 98.3°F | Resp 18 | Ht 62.0 in | Wt 116.8 lb

## 2023-07-24 DIAGNOSIS — M1990 Unspecified osteoarthritis, unspecified site: Secondary | ICD-10-CM

## 2023-07-24 DIAGNOSIS — M8588 Other specified disorders of bone density and structure, other site: Secondary | ICD-10-CM | POA: Diagnosis not present

## 2023-07-24 DIAGNOSIS — S60511A Abrasion of right hand, initial encounter: Secondary | ICD-10-CM

## 2023-07-24 DIAGNOSIS — R053 Chronic cough: Secondary | ICD-10-CM | POA: Diagnosis not present

## 2023-07-24 MED ORDER — HYDROCODONE BIT-HOMATROP MBR 5-1.5 MG/5ML PO SOLN
5.0000 mL | Freq: Every evening | ORAL | 0 refills | Status: DC | PRN
  Filled 2023-07-24: qty 240, 48d supply, fill #0

## 2023-07-24 NOTE — Patient Instructions (Signed)
 Apply triple antibiotic ointment to wound daily and cover with a bandage.

## 2023-07-24 NOTE — Progress Notes (Signed)
 [facility]  POS: clinic  Provider Peggye Ley, ANP [facility] [phone]    Goals of Care:     07/24/2023    1:13 PM  Advanced Directives  Does Patient Have a Medical Advance Directive? Yes  Type of Estate agent of [place];Living will;Out of facility DNR (pink MOST or yellow form)  Does patient want to make changes to medical advance directive? No - Patient declined  Copy of Healthcare Power of Attorney in Chart? Yes - validated most recent copy scanned in chart (See row information)     Chief Complaint  Patient presents with   Acute Visit    Sore on right hand    HPI:   Tammy Lindsey "[name]" is a 89 year old female who presents with concerns about a sore and red scab on her hand.  The scab on her hand, initially thought to be healing well, became red and sore over the weekend. It was sustained after a near fall on July 15, 2023, when she caught herself with her hand. She is concerned about the possibility of infection. There is tenderness at the site, but no spreading redness, swelling, pus, increased pain, fever, or warmth.  She experiences sharp pain in her lower thoracic to upper lumbar spine area, particularly when she uses her feet a lot or gets very tired. The pain is described as 'real sharp' and radiates around to her ribs. She has a history of osteopenia, diagnosed in January 2024.  The pain is not present at this time but has occurred in the past.   She requests a refill of Hycodan syrup, which she uses to manage a cough. She takes approximately half a teaspoon when going from her house to the dining room to prevent coughing during meals.  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   Diverticulosis    Esophageal reflux    Family history of breast cancer 11/25/2021   Hiatal hernia    Hx of colonic polyps    Hypertension    Per new patient packet   Mild  asthma    Per new patient packet   Osteoarthritis    Osteopenia    bone density 1-09 and 2-13   Pulmonary nodules    scattered-CT Scan July 2010, 12-10, and 04-2010-all stable felt benign     Past Surgical History:  Procedure Laterality Date   BREAST LUMPECTOMY WITH RADIOACTIVE SEED LOCALIZATION Left 12/07/2021   Procedure: LEFT BREAST LUMPECTOMY WITH RADIOACTIVE SEED LOCALIZATION;  Surgeon: Griselda Miner, MD;  Location: [facility];  Service: General;  Laterality: Left;   CHOLECYSTECTOMY  04/25/1972   Gall Bladder; [facility]   DG PORTABLE CHEST X RAY ([facility] HX)  2023   Shoulder   DIAGNOSTIC MAMMOGRAM  2022   [facility]; Per new patient packet   JOINT REPLACEMENT Left 08/24/2010   JOINT REPLACEMENT Left 04/25/2010   Reverse    orthroscopic  Rotator Cuff Left 04/25/2009   Partial shoulder Left 04/26/2003   ROTATOR CUFF REPAIR     x 2 1998 and 1999   SPINE SURGERY  04/26/1983   disk   TONSILLECTOMY  04/25/1950   Dr. Elonda Husky; Per new patient packet    Allergies  Allergen Reactions   Sulfa Antibiotics     Outpatient Encounter Medications as of 07/24/2023  Medication Sig   acetaminophen (TYLENOL) 500 MG tablet Take 500 mg  by mouth as needed.   amLODipine (NORVASC) 5 MG tablet Take 1 tablet (5 mg total) by mouth daily with breakfast.   anastrozole (ARIMIDEX) 1 MG tablet Take 1 tablet (1 mg total) by mouth daily.   atenolol (TENORMIN) 25 MG tablet Take 1 tablet (25 mg total) by mouth at bedtime.   Cholecalciferol (D3 PO) Take 250 mcg by mouth daily.   Cyanocobalamin (B-12) 1000 MCG TABS Take by mouth daily.   diclofenac Sodium (VOLTAREN) 1 % GEL Apply topically as needed.   gabapentin (NEURONTIN) 100 MG capsule Take 1 capsule (100 mg total) by mouth at bedtime.   hydrALAZINE (APRESOLINE) 10 MG tablet Take 10 mg by mouth as needed (for blood pressure 150 or above).   mirtazapine (REMERON) 15 MG tablet Take 0.5 tablets (7.5 mg total) by mouth at bedtime as needed for  sleep.   [DISCONTINUED] HYDROcodone bit-homatropine (HYCODAN) 5-1.5 MG/5ML syrup Take 5 mLs by mouth at bedtime as needed for cough.   HYDROcodone bit-homatropine (HYCODAN) 5-1.5 MG/5ML syrup Take 5 mLs by mouth at bedtime as needed for cough.   olmesartan (BENICAR) 20 MG tablet Take 1 tablet (20 mg total) by mouth daily.   pantoprazole (PROTONIX) 40 MG tablet Take 1 tablet (40 mg total) by mouth daily.   No facility-administered encounter medications on file as of 07/24/2023.    Review of Systems:  Review of Systems  Constitutional:  Negative for activity change, appetite change, chills, diaphoresis, fatigue, fever and unexpected weight change.  HENT:  Negative for congestion.   Respiratory:  Negative for cough, shortness of breath and wheezing.   Cardiovascular:  Negative for chest pain, palpitations and leg swelling.  Gastrointestinal:  Negative for abdominal distention, abdominal pain, constipation and diarrhea.  Genitourinary:  Negative for difficulty urinating and dysuria.  Musculoskeletal:  Positive for back pain (hx of , no currently present). Negative for arthralgias, gait problem, joint swelling and myalgias.  Skin:  Positive for wound.  Neurological:  Negative for dizziness, tremors, seizures, syncope, facial asymmetry, speech difficulty, weakness, light-headedness, numbness and headaches.  Psychiatric/Behavioral:  Negative for agitation, behavioral problems and confusion.     Health Maintenance  Topic Date Due   COVID-19 Vaccine (6 - Moderna risk 2024-25 season) 08/10/2023 (Originally 07/03/2023)   Medicare Annual Wellness (AWV)  11/14/2023   DTaP/Tdap/Td (2 - Td or Tdap) 06/13/2032   Pneumonia Vaccine 15+ Years old  Completed   INFLUENZA VACCINE  Completed   DEXA SCAN  Completed   Zoster Vaccines- Shingrix  Completed   HPV VACCINES  Aged Out    Physical Exam: Vitals:   07/24/23 1305  BP: 124/68  Pulse: 65  Resp: 18  Temp: 98.3 F (36.8 C)  SpO2: 91%  Weight: 116  lb 12.8 oz (53 kg)  Height: 5\' 2"  (1.575 m)   Body mass index is 21.36 kg/m. Physical Exam Constitutional:      Appearance: Normal appearance.  Musculoskeletal:        General: No swelling, tenderness, deformity or signs of injury.     Right wrist: Normal. No swelling, deformity, tenderness, bony tenderness, snuff box tenderness or crepitus. Normal range of motion.     Left wrist: Normal.     Thoracic back: Normal.     Lumbar back: Normal.  Skin:    General: Skin is warm and dry.     Comments: Right proximal palmar area with healing eshcar. Slight erythema. No tenderness no swelling no drainage   Neurological:  Mental Status: She is alert and oriented to person, place, and time.     Labs reviewed: Basic Metabolic Panel: Recent Labs    11/03/22 0846 11/08/22 0839 04/13/23 0000  NA 135 137 135*  K 5.3* 4.7 4.7  CL 99 101 99  CO2 22 21 23*  GLUCOSE 83 93  --   BUN 19 13 17   CREATININE 0.84 0.73 0.8  CALCIUM 9.3 9.6 9.3  TSH  --   --  1.35   Liver Function Tests: Recent Labs    04/13/23 0000  AST 19  ALT 12  ALKPHOS 55  ALBUMIN 4.2   No results for input(s): "LIPASE", "AMYLASE" in the last 8760 hours. No results for input(s): "AMMONIA" in the last 8760 hours. CBC: Recent Labs    04/13/23 0000  WBC 6.9  HGB 12.1  HCT 35*  PLT 312   Lipid Panel: Recent Labs    04/13/23 0000  CHOL 136  HDL 57  LDLCALC 63  TRIG 83   Lab Results  Component Value Date   HGBA1C 5.5 06/08/2022    Procedures since last visit: No results found.  Assessment/Plan Hand abrasion A scab on her hand became red and sore over the weekend. The wound is not infected, as there is no spreading redness, swelling, pus, increased pain, fever, or warmth. The redness may be part of the normal healing process. - Apply topical antibiotic ointment such as triple antibiotic ointment. - Cover the wound. - Monitor for worsening symptoms such as increased redness, swelling, or  pus.  Arthritis-related back pain She experiences sharp pain in the lower thoracic to upper lumbar spine area, likely related to arthritis. The pain occurs with extensive foot use or fatigue.  Osteopenia Bone density test from January 2024 showed osteopenia, indicating slightly weakened bones but not osteoporosis. This condition may have helped prevent a fracture during her recent fall.  Chronic cough She uses Hycodan syrup to manage a chronic cough, particularly when going to the dining room to avoid coughing during meals. She takes a small dose (half a teaspoon) as needed. - will re fill, need to consider taper at next visit.   Labs/tests ordered:  * No order type specified * Next appt:  09/05/2023   Total time :  time greater than 50% of total time spent doing pt counseling and coordination of care

## 2023-07-25 ENCOUNTER — Other Ambulatory Visit (HOSPITAL_BASED_OUTPATIENT_CLINIC_OR_DEPARTMENT_OTHER): Payer: Self-pay

## 2023-07-25 MED ORDER — COMIRNATY 30 MCG/0.3ML IM SUSY
PREFILLED_SYRINGE | INTRAMUSCULAR | 0 refills | Status: DC
  Filled 2023-07-25: qty 0.3, 1d supply, fill #0

## 2023-08-03 ENCOUNTER — Other Ambulatory Visit (HOSPITAL_BASED_OUTPATIENT_CLINIC_OR_DEPARTMENT_OTHER): Payer: Self-pay

## 2023-08-03 ENCOUNTER — Other Ambulatory Visit: Payer: Self-pay | Admitting: Cardiology

## 2023-08-03 DIAGNOSIS — I10 Essential (primary) hypertension: Secondary | ICD-10-CM

## 2023-08-03 MED ORDER — AMLODIPINE BESYLATE 5 MG PO TABS
5.0000 mg | ORAL_TABLET | Freq: Every day | ORAL | 3 refills | Status: DC
  Filled 2023-08-03: qty 90, 90d supply, fill #0

## 2023-08-04 ENCOUNTER — Other Ambulatory Visit: Payer: Self-pay

## 2023-08-07 ENCOUNTER — Telehealth (HOSPITAL_BASED_OUTPATIENT_CLINIC_OR_DEPARTMENT_OTHER): Payer: Self-pay | Admitting: Pulmonary Disease

## 2023-08-07 NOTE — Telephone Encounter (Signed)
 Message received from E2C2- patient wants to schedule with Dr. Marygrace Snellen at [facility] location only. Spoke with patient advised her provider does not work at the [facility] location. Patient would like to switch to a provider at [facility].  Please advise if that is okay. Advised patient we will reach out once switch has been approved

## 2023-08-07 NOTE — Telephone Encounter (Signed)
 Dr. Marygrace Snellen, please advise if okay for pt to switch to [facility]?

## 2023-08-08 ENCOUNTER — Encounter: Payer: Self-pay | Admitting: Internal Medicine

## 2023-08-08 NOTE — Telephone Encounter (Signed)
Message sent to PCP Mahlon Gammon, MD

## 2023-08-08 NOTE — Telephone Encounter (Signed)
 Patient wants to see Dr.Gupta in clinic. Please call her and schedule an appointment.

## 2023-08-09 ENCOUNTER — Ambulatory Visit: Payer: Self-pay

## 2023-08-09 NOTE — Telephone Encounter (Signed)
 Copied from CRM [id number]. Topic: Clinical - Pink Word Triage >> Aug 09, 2023  8:11 AM Tammy Lindsey F wrote: Patient seems to be in need of an appointment to acquire medication to help her with her stress level. Patient stated she is not available next week for an appointment because she is moving, when patient was offered an appointment for the week after the patient stated she will not need the appointment then as she feels after the move she believes her stress level will go down. Patient became frustrated and hung up when agent offered an opportunity to speak with a nurse about her concerns.   Please advise patient on her possible next steps and it does seem like she would like the help.   Callback Number: [phone]

## 2023-08-09 NOTE — Telephone Encounter (Signed)
 Message from the morning routed again to PCP Marguerite Shiley, MD

## 2023-08-09 NOTE — Telephone Encounter (Signed)
  Chief Complaint: Stress  Symptoms: Stress, Anxiety Frequency: One Week Pertinent Negatives: Patient denies chest pain, dyspnea, dizziness  Disposition: [] ED /[] Urgent Care (no appt availability in office) / [] Appointment(In office/virtual)/ []  [facility]/ [] Home Care/ [] Refused Recommended Disposition /[] [facility]/ [x]  Follow-up with PCP Additional Notes: JS is being triaged for stress levels and anxiety. The patient denies acute distress like symptoms.   Reason for Disposition  MODERATE anxiety (e.g., persistent or frequent anxiety symptoms; interferes with sleep, school, or work)  Answer Assessment - Initial Assessment Questions 1. CONCERN: "Did anything happen that prompted you to call today?"      Stress  2. ANXIETY SYMPTOMS: "Can you describe how you (your loved one; patient) have been feeling?" (e.g., tense, restless, panicky, anxious, keyed up, overwhelmed, sense of impending doom).       Stress, Anxiousness  3. ONSET: "How long have you been feeling this way?" (e.g., hours, days, weeks)      Last Week  4. SEVERITY: "How would you rate the level of anxiety?" (e.g., 0 - 10; or mild, moderate, severe).     Moderate  5. FUNCTIONAL IMPAIRMENT: "How have these feelings affected your ability to do daily activities?" "Have you had more difficulty than usual doing your normal daily activities?" (e.g., getting better, same, worse; self-care, school, work, interactions)     Same  6. HISTORY: "Have you felt this way before?" "Have you ever been diagnosed with an anxiety problem in the past?" (e.g., generalized anxiety disorder, panic attacks, PTSD). If Yes, ask: "How was this problem treated?" (e.g., medicines, counseling, etc.)     No  7. RISK OF HARM - SUICIDAL IDEATION: "Do you ever have thoughts of hurting or killing yourself?" If Yes, ask:  "Do you have these feelings now?" "Do you have a plan on how you would do this?"     No  8. TREATMENT:  "What has  been done so far to treat this anxiety?" (e.g., medicines, relaxation strategies). "What has helped?"     No  9. TREATMENT - THERAPIST: "Do you have a counselor or therapist? Name?"     No  10. POTENTIAL TRIGGERS: "Do you drink caffeinated beverages (e.g., coffee, colas, teas), and how much daily?" "Do you drink alcohol or use any drugs?" "Have you started any new medicines recently?"       One Cup of Coffee in the morning  11. PATIENT SUPPORT: "Who is with you now?" "Who do you live with?" "Do you have family or friends who you can talk to?"         Yes,   12. OTHER SYMPTOMS: "Do you have any other symptoms?" (e.g., feeling depressed, trouble concentrating, trouble sleeping, trouble breathing, palpitations or fast heartbeat, chest pain, sweating, nausea, or diarrhea)        Anxiousness  13. PREGNANCY: "Is there any chance you are pregnant?" "When was your last menstrual period?"       No and No  Protocols used: Anxiety and Panic Attack-A-AH

## 2023-08-09 NOTE — Telephone Encounter (Addendum)
 The patient was transferred from NT requesting an appointment. We advised we could see her in the office today or schedule for next week. The patient stated she is moving and cannot come in next week and said she would call back. Asked if something could be sent in for anxiety or if something could be recommended OTC, I advised I am not clinical and can get her to the clinical staff. NT RN was on the line as well. Patient declined and hung up.

## 2023-08-10 ENCOUNTER — Other Ambulatory Visit: Payer: Self-pay | Admitting: Internal Medicine

## 2023-08-10 NOTE — Telephone Encounter (Signed)
 Patient ha request refill on medication Hydralazine 10mg . Patient medication hasn't been refilled by PCP Marguerite Shiley, MD. Medication pend and sent to PCP for approval.

## 2023-08-11 ENCOUNTER — Other Ambulatory Visit (HOSPITAL_BASED_OUTPATIENT_CLINIC_OR_DEPARTMENT_OTHER): Payer: Self-pay

## 2023-08-11 ENCOUNTER — Ambulatory Visit (HOSPITAL_BASED_OUTPATIENT_CLINIC_OR_DEPARTMENT_OTHER): Payer: PPO | Admitting: Family

## 2023-08-11 MED ORDER — HYDRALAZINE HCL 10 MG PO TABS
10.0000 mg | ORAL_TABLET | ORAL | 3 refills | Status: DC | PRN
  Filled 2023-08-11: qty 30, 30d supply, fill #0

## 2023-08-12 ENCOUNTER — Other Ambulatory Visit (HOSPITAL_BASED_OUTPATIENT_CLINIC_OR_DEPARTMENT_OTHER): Payer: Self-pay

## 2023-08-14 NOTE — Telephone Encounter (Signed)
 Lm for patient.

## 2023-08-14 NOTE — Telephone Encounter (Signed)
 Ok with me

## 2023-08-15 ENCOUNTER — Encounter: Payer: PPO | Admitting: Internal Medicine

## 2023-08-15 NOTE — Telephone Encounter (Signed)
 Lm x2 for patient.  Will close encounter per office protocol. Mychart message sent.

## 2023-08-28 ENCOUNTER — Ambulatory Visit (HOSPITAL_BASED_OUTPATIENT_CLINIC_OR_DEPARTMENT_OTHER): Payer: PPO | Admitting: Family

## 2023-08-28 ENCOUNTER — Encounter (HOSPITAL_BASED_OUTPATIENT_CLINIC_OR_DEPARTMENT_OTHER): Payer: Self-pay

## 2023-09-05 ENCOUNTER — Other Ambulatory Visit (HOSPITAL_BASED_OUTPATIENT_CLINIC_OR_DEPARTMENT_OTHER): Payer: Self-pay

## 2023-09-05 ENCOUNTER — Telehealth: Payer: Self-pay

## 2023-09-05 ENCOUNTER — Encounter: Payer: Self-pay | Admitting: Internal Medicine

## 2023-09-05 ENCOUNTER — Non-Acute Institutional Stay: Admitting: Internal Medicine

## 2023-09-05 ENCOUNTER — Other Ambulatory Visit (HOSPITAL_COMMUNITY): Payer: Self-pay

## 2023-09-05 VITALS — BP 94/50 | HR 70 | Temp 97.4°F | Resp 20 | Ht 62.0 in | Wt 113.0 lb

## 2023-09-05 DIAGNOSIS — G603 Idiopathic progressive neuropathy: Secondary | ICD-10-CM

## 2023-09-05 DIAGNOSIS — F329 Major depressive disorder, single episode, unspecified: Secondary | ICD-10-CM

## 2023-09-05 DIAGNOSIS — F5101 Primary insomnia: Secondary | ICD-10-CM

## 2023-09-05 DIAGNOSIS — I10 Essential (primary) hypertension: Secondary | ICD-10-CM | POA: Diagnosis not present

## 2023-09-05 MED ORDER — AMLODIPINE BESYLATE 5 MG PO TABS: 2.5000 mg | ORAL_TABLET | Freq: Every day | ORAL | Status: DC

## 2023-09-05 MED ORDER — ESCITALOPRAM OXALATE 5 MG PO TABS
5.0000 mg | ORAL_TABLET | Freq: Every day | ORAL | 1 refills | Status: DC
  Filled 2023-09-05 (×3): qty 30, 30d supply, fill #0

## 2023-09-05 MED ORDER — MIRTAZAPINE 15 MG PO TABS: 7.5000 mg | ORAL_TABLET | Freq: Every day | ORAL | Status: DC

## 2023-09-05 NOTE — Telephone Encounter (Signed)
 Copied from CRM [id number]. Topic: General - Other >> Sep 05, 2023  3:06 PM Tammy Lindsey wrote: Reason for CRM: Patient states she forgot to ask Dr. Venice Gillis about helping her to get a handicap placard for her card. Please give patient a call. CB #: [phone].

## 2023-09-05 NOTE — Progress Notes (Unsigned)
 Location:  [facility] features editor of Service:  Clinic (12)  Provider:   Code Status:  Goals of Care:     07/24/2023    1:13 PM  Advanced Directives  Does Patient Have a Medical Advance Directive? Yes  Type of Estate agent of [place];Living will;Out of facility DNR (pink MOST or yellow form)  Does patient want to make changes to medical advance directive? No - Patient declined  Copy of Healthcare Power of Attorney in Chart? Yes - validated most recent copy scanned in chart (See row information)     Chief Complaint  Patient presents with   Medical Management of Chronic Issues    Follow up visit for Idiopathic progressive neuropathy    HPI: Patient is a 89 y.o. female seen today for medical management of chronic diseases.   [place] in [place]  Recently Moved to Apartment from her Janit Meline and has been going through anxiety and stress Son has suggested to talk to PCP for help  Discussed the use of AI scribe software for clinical note transcription with the patient, who gave verbal consent to proceed.  History of Present Illness   Tammy Lindsey "Tammy Lindsey" is a 89 year old female with anxiety and hypertension who presents with anxiety and low blood pressure.  She experiences persistent anxiety with headaches at her temples. Mirtazapine  aids her sleep, and she denies morning depression but feels anxious. Her son suggested considering anxiety medication. She occasionally tears up when overwhelmed but denies frequent crying.  Her blood pressure was low today, and she has not been regularly monitoring it at home. She takes atenolol , amlodipine , and Benicar  for hypertension.  She has lost six to seven pounds, attributing it to stress from moving, but her appetite is improving, and her weight is stabilizing.  She recently moved from a villa to an apartment, which was stressful. She uses a walker for long distances but not within her apartment. She  occasionally feels homesick and misses having more closet space.       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   Diverticulosis    Esophageal reflux    Family history of breast cancer 11/25/2021   Hiatal hernia    Hx of colonic polyps    Hypertension    Per new patient packet   Mild asthma    Per new patient packet   Osteoarthritis    Osteopenia    bone density 1-09 and 2-13   Pulmonary nodules    scattered-CT Scan July 2010, 12-10, and 04-2010-all stable felt benign     Past Surgical History:  Procedure Laterality Date   BREAST LUMPECTOMY WITH RADIOACTIVE SEED LOCALIZATION Left 12/07/2021   Procedure: LEFT BREAST LUMPECTOMY WITH RADIOACTIVE SEED LOCALIZATION;  Surgeon: Caralyn Chandler, MD;  Location: [facility];  Service: General;  Laterality: Left;   CHOLECYSTECTOMY  04/25/1972   Gall Bladder; [facility]   DG PORTABLE CHEST X RAY ([facility] HX)  2023   Shoulder   DIAGNOSTIC MAMMOGRAM  2022   [facility]; Per new patient packet   JOINT REPLACEMENT Left 08/24/2010   JOINT REPLACEMENT Left 04/25/2010   Reverse    orthroscopic  Rotator Cuff Left 04/25/2009   Partial shoulder Left 04/26/2003   ROTATOR CUFF REPAIR     x 2 1998 and 1999   SPINE SURGERY  04/26/1983   disk   TONSILLECTOMY  04/25/1950  Dr. Gara July; Per new patient packet    Allergies  Allergen Reactions   Sulfa Antibiotics     Outpatient Encounter Medications as of 09/05/2023  Medication Sig   acetaminophen  (TYLENOL ) 500 MG tablet Take 500 mg by mouth as needed.   amLODipine  (NORVASC ) 5 MG tablet Take 1 tablet (5 mg total) by mouth daily with breakfast.   anastrozole  (ARIMIDEX ) 1 MG tablet Take 1 tablet (1 mg total) by mouth daily.   atenolol  (TENORMIN ) 25 MG tablet Take 1 tablet (25 mg total) by mouth at bedtime.   Cholecalciferol (D3 PO) Take 250 mcg by mouth daily.   COVID-19 mRNA vaccine, Pfizer, (COMIRNATY ) syringe Inject into the muscle.    Cyanocobalamin  (B-12) 1000 MCG TABS Take by mouth daily.   diclofenac Sodium (VOLTAREN) 1 % GEL Apply topically as needed.   gabapentin  (NEURONTIN ) 100 MG capsule Take 1 capsule (100 mg total) by mouth at bedtime.   hydrALAZINE  (APRESOLINE ) 10 MG tablet Take 1 tablet (10 mg total) by mouth as needed (for blood pressure 150 or above).   HYDROcodone  bit-homatropine (HYCODAN) 5-1.5 MG/5ML syrup Take 5 mLs by mouth at bedtime as needed for cough.   mirtazapine  (REMERON ) 15 MG tablet Take 0.5 tablets (7.5 mg total) by mouth at bedtime as needed for sleep.   olmesartan  (BENICAR ) 20 MG tablet Take 1 tablet (20 mg total) by mouth daily.   pantoprazole  (PROTONIX ) 40 MG tablet Take 1 tablet (40 mg total) by mouth daily.   No facility-administered encounter medications on file as of 09/05/2023.    Review of Systems:  Review of Systems  Constitutional:  Negative for activity change and appetite change.  HENT: Negative.    Respiratory:  Negative for cough and shortness of breath.   Cardiovascular:  Negative for leg swelling.  Gastrointestinal:  Negative for constipation.  Genitourinary: Negative.   Musculoskeletal:  Negative for arthralgias, gait problem and myalgias.  Skin: Negative.   Neurological:  Negative for dizziness and weakness.  Psychiatric/Behavioral:  Positive for dysphoric mood and sleep disturbance. Negative for confusion. The patient is nervous/anxious.     Health Maintenance  Topic Date Due   COVID-19 Vaccine (7 - 2024-25 season) 09/19/2023   Medicare Annual Wellness (AWV)  11/14/2023   INFLUENZA VACCINE  11/24/2023   DTaP/Tdap/Td (2 - Td or Tdap) 06/13/2032   Pneumonia Vaccine 21+ Years old  Completed   DEXA SCAN  Completed   Zoster Vaccines- Shingrix  Completed   HPV VACCINES  Aged Out   Meningococcal B Vaccine  Aged Out    Physical Exam: Vitals:   09/05/23 0953  BP: (!) 100/58  Pulse: 70  Resp: 20  Temp: (!) 97.4 F (36.3 C)  SpO2: 97%  Weight: 113 lb (51.3 kg)   Height: 5\' 2"  (1.575 m)   Body mass index is 20.67 kg/m. Physical Exam Vitals reviewed.  Constitutional:      Appearance: Normal appearance.  HENT:     Head: Normocephalic.     Nose: Nose normal.     Mouth/Throat:     Mouth: Mucous membranes are moist.     Pharynx: Oropharynx is clear.  Eyes:     Pupils: Pupils are equal, round, and reactive to light.  Cardiovascular:     Rate and Rhythm: Normal rate and regular rhythm.     Pulses: Normal pulses.     Heart sounds: Normal heart sounds. No murmur heard. Pulmonary:     Effort: Pulmonary effort is normal.  Breath sounds: Normal breath sounds.  Abdominal:     General: Abdomen is flat. Bowel sounds are normal.     Palpations: Abdomen is soft.  Musculoskeletal:        General: No swelling.     Cervical back: Neck supple.  Skin:    General: Skin is warm.  Neurological:     General: No focal deficit present.     Mental Status: She is alert and oriented to person, place, and time.  Psychiatric:        Mood and Affect: Mood normal.        Thought Content: Thought content normal.     Labs reviewed: Basic Metabolic Panel: Recent Labs    11/03/22 0846 11/08/22 0839 04/13/23 0000  NA 135 137 135*  K 5.3* 4.7 4.7  CL 99 101 99  CO2 22 21 23*  GLUCOSE 83 93  --   BUN 19 13 17   CREATININE 0.84 0.73 0.8  CALCIUM 9.3 9.6 9.3  TSH  --   --  1.35   Liver Function Tests: Recent Labs    04/13/23 0000  AST 19  ALT 12  ALKPHOS 55  ALBUMIN 4.2   No results for input(s): "LIPASE", "AMYLASE" in the last 8760 hours. No results for input(s): "AMMONIA" in the last 8760 hours. CBC: Recent Labs    04/13/23 0000  WBC 6.9  HGB 12.1  HCT 35*  PLT 312   Lipid Panel: Recent Labs    04/13/23 0000  CHOL 136  HDL 57  LDLCALC 63  TRIG 83   Lab Results  Component Value Date   HGBA1C 5.5 06/08/2022    Procedures since last visit: No results found.  Assessment/Plan Assessment and Plan    Anxiety Persistent  anxiety with headaches. Mirtazapine  effective for sleep. Lexapro initiated for anxiety management with sodium monitoring due to hyponatremia risk. - Start Lexapro 5 mg daily in the morning. - Order blood test to check sodium levels in one week. - Continue mirtazapine  nightly for sleep.  Depression Depressive symptoms with tearfulness and weight loss. Lexapro chosen for dual treatment of anxiety and depression. - Monitor for improvement in depressive symptoms over the next two weeks.  Hypertension Low blood pressure possibly due to weight loss. Adjusting amlodipine  to prevent hypotension. - Reduce amlodipine  to 2.5 mg daily. - Monitor blood pressure regularly. - Reassess blood pressure control in two weeks.  Peripheral neuropathy Ongoing neuropathy with leg numbness and toe pain. Gabapentin  provides some improvement. - Continue gabapentin  as prescribed. - Ensure medication adherence and refill as needed.  Weight loss Recent stress-related weight loss. Appetite improving post-move. Emphasized nutritional intake. - Monitor weight and nutritional intake. - Encourage regular meals and adequate hydration.         Labs/tests ordered:  CBC,BMP in 2 weeks Next appt:  Visit date not found

## 2023-09-05 NOTE — Telephone Encounter (Signed)
Message routed to PCP Gupta, Anjali L, MD  

## 2023-09-05 NOTE — Patient Instructions (Signed)
 Take half of Mirtazapine  every night Starting new med Lexapro everyday Bring your Med list for next time Check your Blood pressure everyday and bring it next time Changing your Amlodipine  to half tablet everyday

## 2023-09-08 NOTE — Telephone Encounter (Signed)
 Patient has appointment 09/19/2023 and this can be done during appointment.

## 2023-09-19 ENCOUNTER — Other Ambulatory Visit (HOSPITAL_BASED_OUTPATIENT_CLINIC_OR_DEPARTMENT_OTHER): Payer: Self-pay

## 2023-09-19 ENCOUNTER — Encounter: Payer: Self-pay | Admitting: Internal Medicine

## 2023-09-19 ENCOUNTER — Non-Acute Institutional Stay: Admitting: Internal Medicine

## 2023-09-19 VITALS — BP 120/62 | HR 79 | Temp 97.8°F | Resp 20 | Ht 62.0 in | Wt 115.2 lb

## 2023-09-19 DIAGNOSIS — F329 Major depressive disorder, single episode, unspecified: Secondary | ICD-10-CM | POA: Diagnosis not present

## 2023-09-19 DIAGNOSIS — F5101 Primary insomnia: Secondary | ICD-10-CM

## 2023-09-19 DIAGNOSIS — G603 Idiopathic progressive neuropathy: Secondary | ICD-10-CM

## 2023-09-19 DIAGNOSIS — I10 Essential (primary) hypertension: Secondary | ICD-10-CM

## 2023-09-19 DIAGNOSIS — R2681 Unsteadiness on feet: Secondary | ICD-10-CM

## 2023-09-19 MED ORDER — ESCITALOPRAM OXALATE 5 MG PO TABS
5.0000 mg | ORAL_TABLET | Freq: Every day | ORAL | 1 refills | Status: DC
  Filled 2023-09-19: qty 90, 90d supply, fill #0

## 2023-09-19 NOTE — Progress Notes (Unsigned)
 Location: [facility] features editor of Service:  Clinic (12)  Provider:   Code Status: *** Goals of Care:     07/24/2023    1:13 PM  Advanced Directives  Does Patient Have a Medical Advance Directive? Yes  Type of Estate agent of [place];Living will;Out of facility DNR (pink MOST or yellow form)  Does patient want to make changes to medical advance directive? No - Patient declined  Copy of Healthcare Power of Attorney in Chart? Yes - validated most recent copy scanned in chart (See row information)     Chief Complaint  Patient presents with   Follow-up    2 week follow up. Patient has concerns about loss stool and her feet swelling at night.    HPI: Patient is a 89 y.o. 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   Diverticulosis    Esophageal reflux    Family history of breast cancer 11/25/2021   Hiatal hernia    Hx of colonic polyps    Hypertension    Per new patient packet   Mild asthma    Per new patient packet   Osteoarthritis    Osteopenia    bone density 1-09 and 2-13   Pulmonary nodules    scattered-CT Scan July 2010, 12-10, and 04-2010-all stable felt benign     Past Surgical History:  Procedure Laterality Date   BREAST LUMPECTOMY WITH RADIOACTIVE SEED LOCALIZATION Left 12/07/2021   Procedure: LEFT BREAST LUMPECTOMY WITH RADIOACTIVE SEED LOCALIZATION;  Surgeon: Caralyn Chandler, MD;  Location: [facility];  Service: General;  Laterality: Left;   CHOLECYSTECTOMY  04/25/1972   Gall Bladder; [facility]   DG PORTABLE CHEST X RAY ([facility] HX)  2023   Shoulder   DIAGNOSTIC MAMMOGRAM  2022   [facility]; Per new patient packet   JOINT REPLACEMENT Left 08/24/2010   JOINT REPLACEMENT Left 04/25/2010   Reverse    orthroscopic  Rotator Cuff Left 04/25/2009   Partial shoulder Left 04/26/2003   ROTATOR CUFF REPAIR     x 2 1998 and  1999   SPINE SURGERY  04/26/1983   disk   TONSILLECTOMY  04/25/1950   Dr. Gara July; Per new patient packet    Allergies  Allergen Reactions   Sulfa Antibiotics     Outpatient Encounter Medications as of 09/19/2023  Medication Sig   acetaminophen  (TYLENOL ) 500 MG tablet Take 500 mg by mouth as needed.   amLODipine  (NORVASC ) 5 MG tablet Take 0.5 tablets (2.5 mg total) by mouth daily with breakfast.   anastrozole  (ARIMIDEX ) 1 MG tablet Take 1 tablet (1 mg total) by mouth daily.   atenolol  (TENORMIN ) 25 MG tablet Take 1 tablet (25 mg total) by mouth at bedtime.   Cholecalciferol (D3 PO) Take 250 mcg by mouth daily.   COVID-19 mRNA vaccine, Pfizer, (COMIRNATY ) syringe Inject into the muscle.   Cyanocobalamin  (B-12) 1000 MCG TABS Take by mouth daily.   diclofenac Sodium (VOLTAREN) 1 % GEL Apply topically as needed.   escitalopram  (LEXAPRO ) 5 MG tablet Take 1 tablet (5 mg total) by mouth daily.   gabapentin  (NEURONTIN ) 100 MG capsule Take 1 capsule (100 mg total) by mouth at bedtime.   hydrALAZINE  (APRESOLINE ) 10 MG tablet Take 1 tablet (10 mg total) by mouth as needed (for blood pressure 150 or above).   HYDROcodone  bit-homatropine (HYCODAN) 5-1.5 MG/5ML  syrup Take 5 mLs by mouth at bedtime as needed for cough.   mirtazapine  (REMERON ) 15 MG tablet Take 0.5 tablets (7.5 mg total) by mouth at bedtime.   olmesartan  (BENICAR ) 20 MG tablet Take 1 tablet (20 mg total) by mouth daily.   pantoprazole  (PROTONIX ) 40 MG tablet Take 1 tablet (40 mg total) by mouth daily.   No facility-administered encounter medications on file as of 09/19/2023.    Review of Systems:  Review of Systems  Health Maintenance  Topic Date Due   COVID-19 Vaccine (7 - 2024-25 season) 09/19/2023   Medicare Annual Wellness (AWV)  11/14/2023   INFLUENZA VACCINE  11/24/2023   DTaP/Tdap/Td (2 - Td or Tdap) 06/13/2032   Pneumonia Vaccine 32+ Years old  Completed   DEXA SCAN  Completed   Zoster Vaccines- Shingrix  Completed    HPV VACCINES  Aged Out   Meningococcal B Vaccine  Aged Out    Physical Exam: Vitals:   09/19/23 1014  BP: 120/62  Pulse: 79  Resp: 20  Temp: 97.8 F (36.6 C)  SpO2: 99%  Weight: 115 lb 3.2 oz (52.3 kg)  Height: 5\' 2"  (1.575 m)   Body mass index is 21.07 kg/m. Physical Exam  Labs reviewed: Basic Metabolic Panel: Recent Labs    11/03/22 0846 11/08/22 0839 04/13/23 0000  NA 135 137 135*  K 5.3* 4.7 4.7  CL 99 101 99  CO2 22 21 23*  GLUCOSE 83 93  --   BUN 19 13 17   CREATININE 0.84 0.73 0.8  CALCIUM 9.3 9.6 9.3  TSH  --   --  1.35   Liver Function Tests: Recent Labs    04/13/23 0000  AST 19  ALT 12  ALKPHOS 55  ALBUMIN 4.2   No results for input(s): "LIPASE", "AMYLASE" in the last 8760 hours. No results for input(s): "AMMONIA" in the last 8760 hours. CBC: Recent Labs    04/13/23 0000  WBC 6.9  HGB 12.1  HCT 35*  PLT 312   Lipid Panel: Recent Labs    04/13/23 0000  CHOL 136  HDL 57  LDLCALC 63  TRIG 83   Lab Results  Component Value Date   HGBA1C 5.5 06/08/2022    Procedures since last visit: No results found.  Assessment/Plan There are no diagnoses linked to this encounter.   Labs/tests ordered:  * No order type specified * Next appt:  Visit date not found

## 2023-09-21 ENCOUNTER — Telehealth: Payer: Self-pay | Admitting: *Deleted

## 2023-09-21 NOTE — Telephone Encounter (Signed)
 Patient notified and agreed.

## 2023-09-21 NOTE — Telephone Encounter (Deleted)
 Copied from CRM [id number]. Topic: General - Other >> Tammy Lindsey 29, 2025 11:24 AM Retta Caster wrote: Reason for CRM: Patient needs call back on if she needs lab orders for lab work and bowel issue when seen on 05/31. Needs clarification on what to do next [phone]

## 2023-09-21 NOTE — Telephone Encounter (Signed)
 Me    09/21/23 12:10 PM Unsigned Note Copied from CRM #[id number]. Topic: General - Other >> Tammy Lindsey 29, 2025 11:24 AM Retta Caster wrote: Reason for CRM: Patient needs call back on if she needs lab orders for lab work and bowel issue when seen on 05/31. Needs clarification on what to do next [phone]        Called and spoke with patient and she stated that a "Friend" asked her if she has had any bloodwork done recently. So patient is calling and asking if she needs any done. Patient was seen at [facility] on 5/27 and stated that she forgot to ask.

## 2023-09-21 NOTE — Telephone Encounter (Signed)
 I have ordered Labs for her for next week. Let her know

## 2023-09-26 DIAGNOSIS — E039 Hypothyroidism, unspecified: Secondary | ICD-10-CM | POA: Diagnosis not present

## 2023-09-26 LAB — HEPATIC FUNCTION PANEL
ALT: 13 U/L (ref 7–35)
AST: 19 (ref 13–35)
Alkaline Phosphatase: 66 (ref 25–125)
Bilirubin, Total: 0.4

## 2023-09-26 LAB — CBC AND DIFFERENTIAL
HCT: 38 (ref 36–46)
Hemoglobin: 12.5 (ref 12.0–16.0)
Platelets: 306 10*3/uL (ref 150–400)
WBC: 6.7

## 2023-09-26 LAB — BASIC METABOLIC PANEL WITH GFR
BUN: 17 (ref 4–21)
CO2: 23 — AB (ref 13–22)
Chloride: 96 — AB (ref 99–108)
Creatinine: 0.7 (ref 0.5–1.1)
Glucose: 102
Potassium: 4.6 meq/L (ref 3.5–5.1)
Sodium: 131 — AB (ref 137–147)

## 2023-09-26 LAB — COMPREHENSIVE METABOLIC PANEL WITH GFR
Albumin: 4.2 (ref 3.5–5.0)
Calcium: 9.1 (ref 8.7–10.7)
Globulin: 2
eGFR: 82

## 2023-09-26 LAB — CBC: RBC: 4.51 (ref 3.87–5.11)

## 2023-09-27 ENCOUNTER — Encounter: Payer: Self-pay | Admitting: Internal Medicine

## 2023-10-08 ENCOUNTER — Encounter (HOSPITAL_BASED_OUTPATIENT_CLINIC_OR_DEPARTMENT_OTHER): Payer: Self-pay

## 2023-10-08 ENCOUNTER — Emergency Department (HOSPITAL_BASED_OUTPATIENT_CLINIC_OR_DEPARTMENT_OTHER)

## 2023-10-08 ENCOUNTER — Inpatient Hospital Stay (HOSPITAL_BASED_OUTPATIENT_CLINIC_OR_DEPARTMENT_OTHER)
Admission: EM | Admit: 2023-10-08 | Discharge: 2023-10-12 | DRG: 536 | Disposition: A | Discharge status: Skilled Nursing Facility | Attending: Internal Medicine | Admitting: Internal Medicine

## 2023-10-08 ENCOUNTER — Other Ambulatory Visit: Payer: Self-pay

## 2023-10-08 DIAGNOSIS — R011 Cardiac murmur, unspecified: Secondary | ICD-10-CM | POA: Insufficient documentation

## 2023-10-08 DIAGNOSIS — I10 Essential (primary) hypertension: Secondary | ICD-10-CM | POA: Diagnosis present

## 2023-10-08 DIAGNOSIS — S32511A Fracture of superior rim of right pubis, initial encounter for closed fracture: Secondary | ICD-10-CM | POA: Diagnosis not present

## 2023-10-08 DIAGNOSIS — F325 Major depressive disorder, single episode, in full remission: Secondary | ICD-10-CM | POA: Diagnosis not present

## 2023-10-08 DIAGNOSIS — S79911A Unspecified injury of right hip, initial encounter: Secondary | ICD-10-CM | POA: Diagnosis not present

## 2023-10-08 DIAGNOSIS — E559 Vitamin D deficiency, unspecified: Secondary | ICD-10-CM | POA: Diagnosis present

## 2023-10-08 DIAGNOSIS — R252 Cramp and spasm: Secondary | ICD-10-CM

## 2023-10-08 DIAGNOSIS — S72001A Fracture of unspecified part of neck of right femur, initial encounter for closed fracture: Secondary | ICD-10-CM | POA: Diagnosis not present

## 2023-10-08 DIAGNOSIS — E871 Hypo-osmolality and hyponatremia: Secondary | ICD-10-CM

## 2023-10-08 DIAGNOSIS — S32509A Unspecified fracture of unspecified pubis, initial encounter for closed fracture: Secondary | ICD-10-CM | POA: Diagnosis present

## 2023-10-08 DIAGNOSIS — S32501A Unspecified fracture of right pubis, initial encounter for closed fracture: Secondary | ICD-10-CM | POA: Diagnosis not present

## 2023-10-08 DIAGNOSIS — Z79811 Long term (current) use of aromatase inhibitors: Secondary | ICD-10-CM

## 2023-10-08 DIAGNOSIS — S32810A Multiple fractures of pelvis with stable disruption of pelvic ring, initial encounter for closed fracture: Secondary | ICD-10-CM | POA: Diagnosis present

## 2023-10-08 DIAGNOSIS — D0512 Intraductal carcinoma in situ of left breast: Secondary | ICD-10-CM | POA: Diagnosis present

## 2023-10-08 DIAGNOSIS — R053 Chronic cough: Secondary | ICD-10-CM | POA: Diagnosis not present

## 2023-10-08 DIAGNOSIS — Z66 Do not resuscitate: Secondary | ICD-10-CM | POA: Diagnosis present

## 2023-10-08 DIAGNOSIS — W19XXXA Unspecified fall, initial encounter: Secondary | ICD-10-CM | POA: Diagnosis not present

## 2023-10-08 DIAGNOSIS — Z7401 Bed confinement status: Secondary | ICD-10-CM | POA: Diagnosis not present

## 2023-10-08 DIAGNOSIS — I959 Hypotension, unspecified: Secondary | ICD-10-CM | POA: Diagnosis not present

## 2023-10-08 DIAGNOSIS — M25551 Pain in right hip: Secondary | ICD-10-CM | POA: Diagnosis not present

## 2023-10-08 DIAGNOSIS — M1611 Unilateral primary osteoarthritis, right hip: Secondary | ICD-10-CM | POA: Diagnosis not present

## 2023-10-08 DIAGNOSIS — W1839XA Other fall on same level, initial encounter: Secondary | ICD-10-CM | POA: Diagnosis present

## 2023-10-08 DIAGNOSIS — Z86000 Personal history of in-situ neoplasm of breast: Secondary | ICD-10-CM

## 2023-10-08 DIAGNOSIS — S32591A Other specified fracture of right pubis, initial encounter for closed fracture: Secondary | ICD-10-CM | POA: Diagnosis not present

## 2023-10-08 DIAGNOSIS — S3289XA Fracture of other parts of pelvis, initial encounter for closed fracture: Secondary | ICD-10-CM | POA: Diagnosis not present

## 2023-10-08 DIAGNOSIS — M25572 Pain in left ankle and joints of left foot: Secondary | ICD-10-CM | POA: Diagnosis not present

## 2023-10-08 DIAGNOSIS — G609 Hereditary and idiopathic neuropathy, unspecified: Secondary | ICD-10-CM | POA: Diagnosis present

## 2023-10-08 DIAGNOSIS — S32591D Other specified fracture of right pubis, subsequent encounter for fracture with routine healing: Secondary | ICD-10-CM | POA: Diagnosis present

## 2023-10-08 DIAGNOSIS — E222 Syndrome of inappropriate secretion of antidiuretic hormone: Secondary | ICD-10-CM | POA: Diagnosis present

## 2023-10-08 DIAGNOSIS — Y92003 Bedroom of unspecified non-institutional (private) residence as the place of occurrence of the external cause: Secondary | ICD-10-CM

## 2023-10-08 LAB — CBC WITH DIFFERENTIAL/PLATELET
Abs Immature Granulocytes: 0.05 10*3/uL (ref 0.00–0.07)
Basophils Absolute: 0 10*3/uL (ref 0.0–0.1)
Basophils Relative: 0 %
Eosinophils Absolute: 0 10*3/uL (ref 0.0–0.5)
Eosinophils Relative: 0 %
HCT: 36.4 % (ref 36.0–46.0)
Hemoglobin: 12.8 g/dL (ref 12.0–15.0)
Immature Granulocytes: 1 %
Lymphocytes Relative: 8 %
Lymphs Abs: 0.8 10*3/uL (ref 0.7–4.0)
MCH: 28 pg (ref 26.0–34.0)
MCHC: 35.2 g/dL (ref 30.0–36.0)
MCV: 79.6 fL — ABNORMAL LOW (ref 80.0–100.0)
Monocytes Absolute: 0.9 10*3/uL (ref 0.1–1.0)
Monocytes Relative: 9 %
Neutro Abs: 8.4 10*3/uL — ABNORMAL HIGH (ref 1.7–7.7)
Neutrophils Relative %: 82 %
Platelets: 239 10*3/uL (ref 150–400)
RBC: 4.57 MIL/uL (ref 3.87–5.11)
RDW: 13 % (ref 11.5–15.5)
WBC: 10.1 10*3/uL (ref 4.0–10.5)
nRBC: 0 % (ref 0.0–0.2)

## 2023-10-08 LAB — BASIC METABOLIC PANEL WITH GFR
Anion gap: 14 (ref 5–15)
BUN: 9 mg/dL (ref 8–23)
CO2: 21 mmol/L — ABNORMAL LOW (ref 22–32)
Calcium: 9.3 mg/dL (ref 8.9–10.3)
Chloride: 90 mmol/L — ABNORMAL LOW (ref 98–111)
Creatinine, Ser: 0.6 mg/dL (ref 0.44–1.00)
GFR, Estimated: >60 mL/min (ref >60)
Glucose, Bld: 99 mg/dL (ref 70–99)
Potassium: 4.4 mmol/L (ref 3.5–5.1)
Sodium: 125 mmol/L — ABNORMAL LOW (ref 135–145)

## 2023-10-08 LAB — URINALYSIS, ROUTINE W REFLEX MICROSCOPIC
Bacteria, UA: NONE SEEN
Bilirubin Urine: NEGATIVE
Glucose, UA: NEGATIVE mg/dL
Ketones, ur: 80 mg/dL — AB
Nitrite: NEGATIVE
Protein, ur: NEGATIVE mg/dL
Specific Gravity, Urine: 1.01 (ref 1.005–1.030)
pH: 5 (ref 5.0–8.0)

## 2023-10-08 LAB — FERRITIN: Ferritin: 281 ng/mL (ref 11–307)

## 2023-10-08 LAB — SODIUM, URINE, RANDOM: Sodium, Ur: 62 mmol/L

## 2023-10-08 LAB — CK: Total CK: 89 U/L (ref 38–234)

## 2023-10-08 LAB — CREATININE, URINE, RANDOM: Creatinine, Urine: 78 mg/dL

## 2023-10-08 LAB — PROTIME-INR
INR: 1.1 (ref 0.8–1.2)
Prothrombin Time: 13.9 s (ref 11.4–15.2)

## 2023-10-08 LAB — VITAMIN B12: Vitamin B-12: 1872 pg/mL — ABNORMAL HIGH (ref 180–914)

## 2023-10-08 LAB — VITAMIN D 25 HYDROXY (VIT D DEFICIENCY, FRACTURES): Vit D, 25-Hydroxy: 29.84 ng/mL — ABNORMAL LOW (ref 30–100)

## 2023-10-08 LAB — MAGNESIUM: Magnesium: 1.9 mg/dL (ref 1.7–2.4)

## 2023-10-08 MED ORDER — LIDOCAINE 5 % EX PTCH
1.0000 | MEDICATED_PATCH | CUTANEOUS | Status: DC
  Administered 2023-10-08 – 2023-10-11 (×4): 1 via TRANSDERMAL
  Filled 2023-10-08 (×4): qty 1

## 2023-10-08 MED ORDER — METHOCARBAMOL 500 MG PO TABS
500.0000 mg | ORAL_TABLET | Freq: Three times a day (TID) | ORAL | Status: DC | PRN
  Administered 2023-10-08: 500 mg via ORAL
  Filled 2023-10-08: qty 1

## 2023-10-08 MED ORDER — FENTANYL CITRATE PF 50 MCG/ML IJ SOSY
25.0000 ug | PREFILLED_SYRINGE | INTRAMUSCULAR | Status: DC | PRN
  Administered 2023-10-08 (×2): 25 ug via INTRAVENOUS
  Filled 2023-10-08 (×2): qty 1

## 2023-10-08 MED ORDER — HYDROCODONE-ACETAMINOPHEN 5-325 MG PO TABS
1.0000 | ORAL_TABLET | ORAL | Status: DC | PRN
  Filled 2023-10-08: qty 2

## 2023-10-08 MED ORDER — ACETAMINOPHEN 650 MG RE SUPP: 650.0000 mg | Freq: Four times a day (QID) | RECTAL | Status: DC | PRN

## 2023-10-08 MED ORDER — ENOXAPARIN SODIUM 40 MG/0.4ML IJ SOSY
40.0000 mg | PREFILLED_SYRINGE | INTRAMUSCULAR | Status: DC
  Administered 2023-10-08 – 2023-10-11 (×4): 40 mg via SUBCUTANEOUS
  Filled 2023-10-08 (×4): qty 0.4

## 2023-10-08 MED ORDER — ONDANSETRON HCL 4 MG/2ML IJ SOLN: 4.0000 mg | Freq: Four times a day (QID) | INTRAMUSCULAR | Status: DC | PRN

## 2023-10-08 MED ORDER — ACETAMINOPHEN 325 MG PO TABS
650.0000 mg | ORAL_TABLET | Freq: Four times a day (QID) | ORAL | Status: DC | PRN
  Administered 2023-10-08: 650 mg via ORAL
  Filled 2023-10-08: qty 2

## 2023-10-08 MED ORDER — ATENOLOL 25 MG PO TABS
25.0000 mg | ORAL_TABLET | Freq: Every day | ORAL | Status: DC
  Filled 2023-10-08: qty 1

## 2023-10-08 MED ORDER — ESCITALOPRAM OXALATE 10 MG PO TABS
5.0000 mg | ORAL_TABLET | Freq: Every day | ORAL | Status: DC
  Administered 2023-10-09 – 2023-10-11 (×3): 5 mg via ORAL
  Filled 2023-10-08 (×3): qty 1

## 2023-10-08 MED ORDER — PANTOPRAZOLE SODIUM 40 MG PO TBEC: 40.0000 mg | DELAYED_RELEASE_TABLET | Freq: Every day | ORAL | Status: DC | PRN

## 2023-10-08 MED ORDER — MIRTAZAPINE 15 MG PO TABS
7.5000 mg | ORAL_TABLET | Freq: Every day | ORAL | Status: DC
  Administered 2023-10-08 – 2023-10-11 (×4): 7.5 mg via ORAL
  Filled 2023-10-08 (×4): qty 1

## 2023-10-08 MED ORDER — SODIUM CHLORIDE 0.9 % IV BOLUS
500.0000 mL | Freq: Once | INTRAVENOUS | Status: AC
  Administered 2023-10-08: 500 mL via INTRAVENOUS

## 2023-10-08 MED ORDER — AMLODIPINE BESYLATE 5 MG PO TABS
2.5000 mg | ORAL_TABLET | Freq: Every day | ORAL | Status: DC
  Filled 2023-10-08: qty 1

## 2023-10-08 MED ORDER — ONDANSETRON HCL 4 MG PO TABS: 4.0000 mg | ORAL_TABLET | Freq: Four times a day (QID) | ORAL | Status: DC | PRN

## 2023-10-08 MED ORDER — FOLIC ACID 1 MG PO TABS
500.0000 ug | ORAL_TABLET | Freq: Every day | ORAL | Status: DC
  Administered 2023-10-08 – 2023-10-11 (×4): 0.5 mg via ORAL
  Filled 2023-10-08 (×4): qty 1

## 2023-10-08 MED ORDER — DOCUSATE SODIUM 100 MG PO CAPS
100.0000 mg | ORAL_CAPSULE | Freq: Two times a day (BID) | ORAL | Status: DC
  Administered 2023-10-08 – 2023-10-11 (×7): 100 mg via ORAL
  Filled 2023-10-08 (×8): qty 1

## 2023-10-08 MED ORDER — MORPHINE SULFATE (PF) 2 MG/ML IV SOLN: 1.0000 mg | INTRAVENOUS | Status: DC | PRN

## 2023-10-08 NOTE — Assessment & Plan Note (Signed)
 2/6 systolic flow murmur

## 2023-10-08 NOTE — ED Notes (Signed)
 Answered call light, pt finished using the bedpan. Pt accidentally got some urine on the sheets, changed the sheets and gown and provided a wet washcloth to clean peri area. Pt advised  it has to be something else I can do when I need to use the bathroom, bc I can not do this every time. RN [facility] notified.

## 2023-10-08 NOTE — H&P (Signed)
 History and Physical    Patient: Tammy Lindsey:[id number] DOB: [birth year] DOA: 10/08/2023 DOS: the patient was seen and examined on 10/08/2023 PCP: Marguerite Shiley, MD  Patient coming from: [facility] [place] at [facility]. Ambulates with walker   Chief Complaint: fall with right hip pain   HPI: Tammy Lindsey is a 89 y.o. female with medical history significant of neuropathy, breast cancer, depression, varicose veins, hypertension, chronic cough who presented to ED with right hip pain after a fall. She states on Saturday she was walking in her bedroom and reached for the bed post and must have missed because the next thing she knows she was on the ground. Fell hard onto her right hip/buttocks area. She was able to get up and continue to do her daily activities. The pain got more severe so she decided she better get checked out. Pain was severe, but with medication has come down to a 6/10. She has pain over her right hip and in pubic area. She can't move her right leg due to the pain. She denies any prodromal symptoms, loss of consciousness. Specifically denies light headedness, dizziness, chest pain, palpitations or shortness of breath.   I talked to her son and she was not using her walker when she was walking in her bedroom.   She has been feeling good. Denies any fever/chills, vision changes/headaches, chest pain or palpitations, shortness of breath or cough, abdominal pain, N/V/D, dysuria or leg swelling.   She does not smoke or drink alcohol   ER Course:  vitals: afebrile, bp: 107/61, HR: 63, RR: 20, oxygen: 100%RA Pertinent labs: sodium: 125,  Right hip xray: Acute fracture of the junction of the pubic body and the superior right pubic ramus with approximately 7 mm inferior displacement of the pubic body with respect to the superior pubic ramus. 2. Additional nondisplaced acute fracture of the right inferior pubic ramus. 3. Mild right femoroacetabular osteoarthritis. In ED: ortho consulted.     Review of Systems: As mentioned in the history of present illness. All other systems reviewed and are negative. 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   Diverticulosis    Esophageal reflux    Family history of breast cancer 11/25/2021   Hiatal hernia    Hx of colonic polyps    Hypertension    Per new patient packet   Mild asthma    Per new patient packet   Osteoarthritis    Osteopenia    bone density 1-09 and 2-13   Pulmonary nodules    scattered-CT Scan July 2010, 12-10, and 04-2010-all stable felt benign    Past Surgical History:  Procedure Laterality Date   BREAST LUMPECTOMY WITH RADIOACTIVE SEED LOCALIZATION Left 12/07/2021   Procedure: LEFT BREAST LUMPECTOMY WITH RADIOACTIVE SEED LOCALIZATION;  Surgeon: Caralyn Chandler, MD;  Location: [facility];  Service: General;  Laterality: Left;   CHOLECYSTECTOMY  04/25/1972   Gall Bladder; [facility]   DG PORTABLE CHEST X RAY ([facility] HX)  2023   Shoulder   DIAGNOSTIC MAMMOGRAM  2022   [facility]; Per new patient packet   JOINT REPLACEMENT Left 08/24/2010   JOINT REPLACEMENT Left 04/25/2010   Reverse    orthroscopic  Rotator Cuff Left 04/25/2009   Partial shoulder Left 04/26/2003   ROTATOR CUFF REPAIR     x 2 1998 and 1999   SPINE SURGERY  04/26/1983   disk   TONSILLECTOMY  04/25/1950   Dr. Gara July; Per new patient packet   Social History:  reports that she has never smoked. She has never used smokeless tobacco. She reports that she does not currently use alcohol. She reports that she does not use drugs.  Allergies  Allergen Reactions   Arimidex  [Anastrozole ] Other (See Comments)    Caused excessive sweating   Sulfa Antibiotics Other (See Comments)    The patient said she was told when she was 17 she was allergic to this.    Family History  Problem Relation Age of Onset   Cancer Mother        cervical   Alzheimer's disease Mother    Asthma Father    Other  Father        Cerebral hemorrhage   Heart disease Sister        Heart disease before age 69   Hypertension Sister    Stroke Maternal Grandfather    Heart attack Maternal Grandfather    Breast cancer Daughter 44   Asthma Son    Cancer Cousin        dx early 71s; unknown type; paternal female cousin   Neuropathy Neg Hx     Prior to Admission medications   Medication Sig Start Date End Date Taking? Authorizing Provider  acetaminophen  (TYLENOL ) 500 MG tablet Take 500 mg by mouth as needed.    [provider]  amLODipine  (NORVASC ) 5 MG tablet Take 0.5 tablets (2.5 mg total) by mouth daily with breakfast. 09/05/23   Marguerite Shiley, MD  anastrozole  (ARIMIDEX ) 1 MG tablet Take 1 tablet (1 mg total) by mouth daily. 05/08/23   Sumner Ends, MD  atenolol  (TENORMIN ) 25 MG tablet Take 1 tablet (25 mg total) by mouth at bedtime. 07/06/23   Sheryle Donning, MD  Cholecalciferol (D3 PO) Take 250 mcg by mouth daily.    [provider]  COVID-19 mRNA vaccine, Pfizer, (COMIRNATY ) syringe Inject into the muscle. 07/25/23   Liane Redman, MD  Cyanocobalamin  (B-12) 1000 MCG TABS Take by mouth daily.    [provider]  diclofenac Sodium (VOLTAREN) 1 % GEL Apply topically as needed.    [provider]  escitalopram  (LEXAPRO ) 5 MG tablet Take 1 tablet (5 mg total) by mouth daily. 09/19/23   Marguerite Shiley, MD  hydrALAZINE  (APRESOLINE ) 10 MG tablet Take 1 tablet (10 mg total) by mouth as needed (for blood pressure 150 or above). 08/11/23   Marguerite Shiley, MD  HYDROcodone  bit-homatropine [facility]) 5-1.5 MG/5ML syrup Take 5 mLs by mouth at bedtime as needed for cough. 07/24/23   Raylene Calamity, NP  mirtazapine  (REMERON ) 15 MG tablet Take 0.5 tablets (7.5 mg total) by mouth at bedtime. 09/05/23   Marguerite Shiley, MD  olmesartan  (BENICAR ) 20 MG tablet Take 1 tablet (20 mg total) by mouth daily. 11/16/22   Clearnce Curia, NP  pantoprazole  (PROTONIX ) 40 MG tablet Take 1  tablet (40 mg total) by mouth daily. 07/04/23   Marguerite Shiley, MD    Physical Exam: Vitals:   10/08/23 1300 10/08/23 1330 10/08/23 1400 10/08/23 1531  BP: (!) 154/72 (!) 141/64 (!) 152/72 (!) 135/58  Pulse: 69 67 73 67  Resp: 16 14 18 18   Temp:   98 F (36.7 C) 97.7 F (36.5 C)  TempSrc:   Oral Oral  SpO2: 99% 97% 100% 98%  Weight:    54.5 kg  Height:    5' 2 (1.575 m)   General:  Appears calm and comfortable and is in NAD Eyes:  PERRL, EOMI, normal lids, iris ENT:  HOH, lips & tongue, mmm; appropriate dentition Neck:  no LAD, masses or thyromegaly; no carotid bruits Cardiovascular:  RRR, systolic murmur over right 2nd intercostal space, 2/6. . No LE edema.  Respiratory:   CTA bilaterally with no wheezes/rales/rhonchi.  Normal respiratory effort. Abdomen:  soft, NT, ND, NABS Back:   normal alignment, no CVAT Skin:  no rash or induration seen on limited exam Musculoskeletal:  grossly normal tone BUE/LLE. RLE: pedal pulses intact. TTP over right hip. Will not move leg due to pain. No edema, no abduction or shortened leg.  Lower extremity:  No LE edema.  Limited foot exam with no ulcerations.  2+ distal pulses. Psychiatric:  grossly normal mood and affect, speech fluent and appropriate, AOx3 Neurologic:  CN 2-12 grossly intact, moves all extremities in coordinated fashion, sensation intact   Radiological Exams on Admission: Independently reviewed - see discussion in A/P where applicable  DG Hip Unilat W or Wo Pelvis 2-3 Views Right Result Date: 10/08/2023 CLINICAL DATA:  Fall last night.  Right hip pain. EXAM: DG HIP (WITH OR WITHOUT PELVIS) 2-3V RIGHT COMPARISON:  KUB 10/25/2021 FINDINGS: There is diffuse decreased bone mineralization. There is acute fracture of the junction of the pubic body and the superior right pubic ramus with approximately 7 mm inferior displacement of the pubic body with respect to the superior pubic ramus. Additional nondisplaced fracture line lucency  overlying the right inferior pubic ramus with minimal 1 mm superior cortical step-off. Moderate to severe pubic symphysis joint space narrowing, subchondral sclerosis, mild peripheral osteophytosis. Mild right femoroacetabular joint space narrowing and peripheral osteophytosis. Calcifications overlying the left hemipelvis compatible with vascular phleboliths versus fibroid calcifications, unchanged. IMPRESSION: 1. Acute fracture of the junction of the pubic body and the superior right pubic ramus with approximately 7 mm inferior displacement of the pubic body with respect to the superior pubic ramus. 2. Additional nondisplaced acute fracture of the right inferior pubic ramus. 3. Mild right femoroacetabular osteoarthritis. Electronically Signed   By: Bertina Broccoli M.D.   On: 10/08/2023 11:04    EKG: Independently reviewed.  NSR with rate 63; nonspecific ST changes with no evidence of acute ischemia, PAC new   Labs on Admission: I have personally reviewed the available labs and imaging studies at the time of the admission.  Pertinent labs:   Sodium 125   Assessment and Plan: Principal Problem:   Pubic bone fracture (HCC) Active Problems:   Hyponatremia   Hypertension   Chronic cough   Ductal carcinoma in situ (DCIS) of left breast   Leg cramps   Major depressive disorder, single episode, in full remission (HCC)   Idiopathic neuropathy   Systolic murmur    Assessment and Plan: * Pubic bone fracture (HCC) 89 year old presenting to ED after fall at home with right sided hip pain found to have acute fracture of her pubic body and superior right pubic ramus and right inferior ramus.  -obs to tele  -has approximately 7mm inferior displacement of the pubic body with respect to the superior pubic ramus -ortho consulted by EDP, Dr. Deeann Fare who recommended WBAT, PT and pain control. Not surgical case  -no prodromal symptoms, LOC, appears to be mechanical fall  -PT has been ordered, [place]  at [facility] -pain control with scheduled colace  -ice pack  -lidocaine  patch  -check vitamin D and UA  -check orthostatics  Hyponatremia -poor water intake and had an episode of diarrhea daily recently (just x1) -500cc IVF bolus -check urine studies -? SSRI contributing  -PO intake  -trend   Hypertension -On norvasc  2.5mg , but has been taking 5mg . Change back to 2.5mg  (decreased by PCP due to hypotension)  -continue atenolol  -hold benicar  with softer readings   Chronic cough Followed by pulmonology and allergy  Hydrocodone  syrup prn   Ductal carcinoma in situ (DCIS) of left breast S/p lumpectomy in 11/2021 and in clinical remission  Letrozole  12/20/2021 Anastrozole  08/02/2022-stopped due to SE  Follows yearly with Dr. Scherrie Curt  Leg cramps Chronic, discussed with PCP and has been stretching Encouraged water TSH wnl 12/24 Check CK/mag/B12 and ferritin  PRN robaxin   Idiopathic neuropathy ? Gabapenin in PCP note, but did not mention on this medrec   Major depressive disorder, single episode, in full remission (HCC) Continue lexapro  and remeron    Systolic murmur 2/6 systolic flow murmur     Advance Care Planning:   Code Status: Limited: Do not attempt resuscitation (DNR) -DNR-LIMITED -Do Not Intubate/DNI    Consults: ortho: Dr. Deeann Fare and PT   DVT Prophylaxis: lovenox   Family Communication: updated son, Chip Hanssen, by phone.   Severity of Illness: The appropriate patient status for this patient is OBSERVATION. Observation status is judged to be reasonable and necessary in order to provide the required intensity of service to ensure the patient's safety. The patient's presenting symptoms, physical exam findings, and initial radiographic and laboratory data in the context of their medical condition is felt to place them at decreased risk for further clinical deterioration. Furthermore, it is anticipated that the patient will be medically stable for discharge  from the hospital within 2 midnights of admission.   Author: Raymona Caldwell, MD 10/08/2023 6:44 PM  For on call review [email or web address].

## 2023-10-08 NOTE — Assessment & Plan Note (Addendum)
-  poor water intake and had an episode of diarrhea daily recently (just x1) -500cc IVF bolus -check urine studies -? SSRI contributing  -PO intake  -trend

## 2023-10-08 NOTE — ED Notes (Signed)
 Kim with cl called with updated bed [phone]

## 2023-10-08 NOTE — Progress Notes (Signed)
 Plan of Care Note for accepted transfer   Patient: Tammy Lindsey MRN: [id number]   DOA: 10/08/2023  Facility [facility] transfer[facility] Requesting Provider: Rosealee Concha, MD. Reason for transfer: Pubic bone fracture. Facility course:  89 year old female with a past medical history of neuropathy, breast cancer, depression, varicose veins, hypertension who was taken to [facility] after having a fall at home from her bedside.  She developed immediate right hip pain.  Workup is showing pubic bone fractures on the right side.  Orthopedic surgery recommended pain control and physical therapy.  They will consult once the patient is in the hospital.  Plan of care: The patient is accepted for admission to Telemetry unit, at [facility]..   Author: Danice Dural, MD 10/08/2023  Check [email or web address] for on-call coverage.  Nursing staff, Please call [facility] Admits & Consults System-Wide number on Amion as soon as patient's arrival, so appropriate admitting provider can evaluate the pt.

## 2023-10-08 NOTE — Plan of Care (Signed)

## 2023-10-08 NOTE — Assessment & Plan Note (Addendum)
 89 year old presenting to ED after fall at home with right sided hip pain found to have acute fracture of her pubic body and superior right pubic ramus and right inferior ramus.  -obs to tele  -has approximately 7mm inferior displacement of the pubic body with respect to the superior pubic ramus -ortho consulted by EDP, Dr. Deeann Fare who recommended WBAT, PT and pain control. Not surgical case  -no prodromal symptoms, LOC, appears to be mechanical fall  -PT has been ordered, [place] [facility] -pain control with scheduled colace  -ice pack  -lidocaine  patch  -check vitamin D and UA  -check orthostatics

## 2023-10-08 NOTE — Assessment & Plan Note (Signed)
Continue lexapro and remeron

## 2023-10-08 NOTE — ED Notes (Signed)
 Tammy Lindsey with cl called for transport

## 2023-10-08 NOTE — Assessment & Plan Note (Addendum)
 Chronic, discussed with PCP and has been stretching Encouraged water TSH wnl 12/24 Check CK/mag/B12 and ferritin  PRN robaxin

## 2023-10-08 NOTE — ED Notes (Signed)
 FALL RISK BUNDLE IN PLACE

## 2023-10-08 NOTE — ED Provider Notes (Addendum)
 [place] EMERGENCY DEPARTMENT AT [facility] Provider Note   CSN: [id number] Arrival date & time: 10/08/23  1007     Patient presents with: Tammy Lindsey is a 89 y.o. female.    Fall     89 year old female with medical history significant for osteoarthritis and osteopenia presenting to the emergency department with right hip pain after a fall.  The patient states she was ambulating to the bathroom at home earlier overnight.  She fell at bedside and partially lowered herself to the floor but did strike the floor fairly hard on her right hip and pelvis.  She states that she has had 6 out of 10 pain in the right hip when attempting to ambulate.  She denies any head trauma or loss of consciousness.  She denies any other injuries or complaints.  She arrives GCS 15, ABC intact.  Prior to Admission medications   Medication Sig Start Date End Date Taking? Authorizing Provider  acetaminophen  (TYLENOL ) 500 MG tablet Take 500 mg by mouth as needed.    [provider]  amLODipine  (NORVASC ) 5 MG tablet Take 0.5 tablets (2.5 mg total) by mouth daily with breakfast. 09/05/23   Marguerite Shiley, MD  anastrozole  (ARIMIDEX ) 1 MG tablet Take 1 tablet (1 mg total) by mouth daily. 05/08/23   Sumner Ends, MD  atenolol  (TENORMIN ) 25 MG tablet Take 1 tablet (25 mg total) by mouth at bedtime. 07/06/23   Sheryle Donning, MD  Cholecalciferol (D3 PO) Take 250 mcg by mouth daily.    [provider]  COVID-19 mRNA vaccine, Pfizer, (COMIRNATY ) syringe Inject into the muscle. 07/25/23   Liane Redman, MD  Cyanocobalamin  (B-12) 1000 MCG TABS Take by mouth daily.    [provider]  diclofenac Sodium (VOLTAREN) 1 % GEL Apply topically as needed.    [provider]  escitalopram  (LEXAPRO ) 5 MG tablet Take 1 tablet (5 mg total) by mouth daily. 09/19/23   Marguerite Shiley, MD  hydrALAZINE  (APRESOLINE ) 10 MG tablet Take 1 tablet (10 mg total) by mouth as needed (for  blood pressure 150 or above). 08/11/23   Marguerite Shiley, MD  HYDROcodone  bit-homatropine (HYCODAN) 5-1.5 MG/5ML syrup Take 5 mLs by mouth at bedtime as needed for cough. 07/24/23   Raylene Calamity, NP  mirtazapine  (REMERON ) 15 MG tablet Take 0.5 tablets (7.5 mg total) by mouth at bedtime. 09/05/23   Marguerite Shiley, MD  olmesartan  (BENICAR ) 20 MG tablet Take 1 tablet (20 mg total) by mouth daily. 11/16/22   Clearnce Curia, NP  pantoprazole  (PROTONIX ) 40 MG tablet Take 1 tablet (40 mg total) by mouth daily. 07/04/23   Marguerite Shiley, MD    Allergies: Sulfa antibiotics    Review of Systems  All other systems reviewed and are negative.   Updated Vital Signs BP (!) 154/72   Pulse 69   Temp 98.4 F (36.9 C)   Resp 16   Ht 5' 2 (1.575 m)   Wt 52.2 kg   SpO2 99%   BMI 21.03 kg/m   Physical Exam Vitals and nursing note reviewed.  Constitutional:      General: She is not in acute distress.    Appearance: She is well-developed.     Comments: GCS 15, ABC intact  HENT:     Head: Normocephalic and atraumatic.   Eyes:     Extraocular Movements: Extraocular movements intact.     Conjunctiva/sclera: Conjunctivae normal.  Pupils: Pupils are equal, round, and reactive to light.   Neck:     Comments: No midline tenderness to palpation of the cervical spine.  Range of motion intact Cardiovascular:     Rate and Rhythm: Normal rate and regular rhythm.  Pulmonary:     Effort: Pulmonary effort is normal. No respiratory distress.     Breath sounds: Normal breath sounds.  Chest:     Comments: Clavicles stable nontender to AP compression.  Chest wall stable and nontender to AP and lateral compression. Abdominal:     Palpations: Abdomen is soft.     Tenderness: There is no abdominal tenderness.     Comments: Pelvis stable to lateral compression, bilateral pelvic TTP   Musculoskeletal:        General: No swelling.     Cervical back: Neck supple.     Comments: No midline tenderness to  palpation of the thoracic or lumbar spine.  Extremities atraumatic with intact range of motion   Skin:    General: Skin is warm and dry.     Capillary Refill: Capillary refill takes less than 2 seconds.   Neurological:     Mental Status: She is alert.     Comments: Cranial nerves II through XII grossly intact.  Moving all 4 extremities spontaneously.  Sensation grossly intact all 4 extremities  Psychiatric:        Mood and Affect: Mood normal.     (all labs ordered are listed, but only abnormal results are displayed) Labs Reviewed  CBC WITH DIFFERENTIAL/PLATELET - Abnormal; Notable for the following components:      Result Value   MCV 79.6 (*)    Neutro Abs 8.4 (*)    All other components within normal limits  BASIC METABOLIC PANEL WITH GFR - Abnormal; Notable for the following components:   Sodium 125 (*)    Chloride 90 (*)    CO2 21 (*)    All other components within normal limits  PROTIME-INR    EKG: EKG Interpretation Date/Time:  Sunday October 08 2023 12:58:07 EDT Ventricular Rate:  63 PR Interval:  163 QRS Duration:  109 QT Interval:  430 QTC Calculation: 441 R Axis:   15  Text Interpretation: Sinus rhythm Atrial premature complexes LVH with secondary repolarization abnormality Anterior infarct, old Confirmed by Rosealee Concha ([id number]) on 10/08/2023 1:26:40 PM  Radiology: Lenell Query Hip Unilat W or Wo Pelvis 2-3 Views Right Result Date: 10/08/2023 CLINICAL DATA:  Fall last night.  Right hip pain. EXAM: DG HIP (WITH OR WITHOUT PELVIS) 2-3V RIGHT COMPARISON:  KUB 10/25/2021 FINDINGS: There is diffuse decreased bone mineralization. There is acute fracture of the junction of the pubic body and the superior right pubic ramus with approximately 7 mm inferior displacement of the pubic body with respect to the superior pubic ramus. Additional nondisplaced fracture line lucency overlying the right inferior pubic ramus with minimal 1 mm superior cortical step-off. Moderate to severe pubic  symphysis joint space narrowing, subchondral sclerosis, mild peripheral osteophytosis. Mild right femoroacetabular joint space narrowing and peripheral osteophytosis. Calcifications overlying the left hemipelvis compatible with vascular phleboliths versus fibroid calcifications, unchanged. IMPRESSION: 1. Acute fracture of the junction of the pubic body and the superior right pubic ramus with approximately 7 mm inferior displacement of the pubic body with respect to the superior pubic ramus. 2. Additional nondisplaced acute fracture of the right inferior pubic ramus. 3. Mild right femoroacetabular osteoarthritis. Electronically Signed   By: Bertina Broccoli M.D.   On:  10/08/2023 11:04     Procedures   Medications Ordered in the ED  fentaNYL  (SUBLIMAZE ) injection 25 mcg (25 mcg Intravenous Given 10/08/23 1152)                                    Medical Decision Making Amount and/or Complexity of Data Reviewed Labs: ordered. Radiology: ordered.  Risk Prescription drug management. Decision regarding hospitalization.    89 year old female with medical history significant for osteoarthritis and osteopenia presenting to the emergency department with right hip pain after a fall.  The patient states she was ambulating to the bathroom at home earlier overnight.  She fell at bedside and partially lowered herself to the floor but did strike the floor fairly hard on her right hip and pelvis.  She states that she has had 6 out of 10 pain in the right hip when attempting to ambulate.  She denies any head trauma or loss of consciousness.  She denies any other injuries or complaints.  She arrives GCS 15, ABC intact.  On arrival, the patient was vitally stable.  Presenting with pain after a ground-level fall.  Pain focally located into the pelvis specifically on the right side and then the right hip.  Patient had no head trauma or neck trauma and has reassuring physical exam other than concern for potential hip  or pelvic fracture.  X-ray imaging performed: IMPRESSION:  1. Acute fracture of the junction of the pubic body and the superior  right pubic ramus with approximately 7 mm inferior displacement of  the pubic body with respect to the superior pubic ramus.  2. Additional nondisplaced acute fracture of the right inferior  pubic ramus.  3. Mild right femoroacetabular osteoarthritis.    Discussed with Dr. Deeann Fare of on-call orthopedics, recommended weightbearing as tolerated, pain control and work with PT, fracture is stable and likely nonoperative.  Labs: INR normal, CBC without a leukocytosis or anemia, mild hyponatremia to 125, renal function normal.  Patient is not altered, sodium 125, no immediate intervention regarding her hyponatremia is indicated in the ER.  Hospitalist medicine consulted for admission, Dr. Bonita Bussing accepting.     Final diagnoses:  Multiple closed fractures of pelvis with stable disruption of pelvic ring, initial encounter [facility]  Hyponatremia    ED Discharge Orders     None          Rosealee Concha, MD 10/08/23 1340    Rosealee Concha, MD 10/08/23 1341

## 2023-10-08 NOTE — ED Notes (Signed)
 Answered radio to assist pt to use the bathroom, advised once finished assisting with another pt will be in to assist. Put pt on bedpan and advised to ring call light once finished.

## 2023-10-08 NOTE — Assessment & Plan Note (Addendum)
 S/p lumpectomy in 11/2021 and in clinical remission  Letrozole  12/20/2021 Anastrozole  08/02/2022-stopped due to SE  Follows yearly with Dr. Scherrie Curt

## 2023-10-08 NOTE — Assessment & Plan Note (Addendum)
?   Gabapenin in PCP note, but did not mention on this medrec

## 2023-10-08 NOTE — Assessment & Plan Note (Addendum)
-  On norvasc  2.5mg , but has been taking 5mg . Change back to 2.5mg  (decreased by PCP due to hypotension)  -continue atenolol  -hold benicar  with softer readings

## 2023-10-08 NOTE — ED Triage Notes (Signed)
 Pt bib [facility] from home. Pt is alert and oriented per report. VSS per report. She caught herself from falling at bedside last night and lowered self to floor. Complains of right hip pain 6/10 when ambulating. Did not hit head, no blood thinners

## 2023-10-08 NOTE — Assessment & Plan Note (Signed)
 Followed by pulmonology and allergy  Hydrocodone  syrup prn

## 2023-10-08 NOTE — ED Notes (Signed)
 Tammy Lindsey with cl called for

## 2023-10-09 DIAGNOSIS — S32810A Multiple fractures of pelvis with stable disruption of pelvic ring, initial encounter for closed fracture: Secondary | ICD-10-CM | POA: Diagnosis not present

## 2023-10-09 DIAGNOSIS — E871 Hypo-osmolality and hyponatremia: Secondary | ICD-10-CM

## 2023-10-09 LAB — BASIC METABOLIC PANEL WITH GFR
Anion gap: 6 (ref 5–15)
BUN: 12 mg/dL (ref 8–23)
CO2: 23 mmol/L (ref 22–32)
Calcium: 8 mg/dL — ABNORMAL LOW (ref 8.9–10.3)
Chloride: 96 mmol/L — ABNORMAL LOW (ref 98–111)
Creatinine, Ser: 0.53 mg/dL (ref 0.44–1.00)
GFR, Estimated: >60 mL/min (ref >60)
Glucose, Bld: 95 mg/dL (ref 70–99)
Potassium: 4 mmol/L (ref 3.5–5.1)
Sodium: 125 mmol/L — ABNORMAL LOW (ref 135–145)

## 2023-10-09 MED ORDER — METHOCARBAMOL 500 MG PO TABS
500.0000 mg | ORAL_TABLET | Freq: Three times a day (TID) | ORAL | Status: DC
  Administered 2023-10-09 – 2023-10-11 (×7): 500 mg via ORAL
  Filled 2023-10-09 (×8): qty 1

## 2023-10-09 MED ORDER — OXYCODONE HCL 5 MG PO TABS
5.0000 mg | ORAL_TABLET | ORAL | Status: DC | PRN
  Administered 2023-10-10 – 2023-10-11 (×3): 5 mg via ORAL
  Filled 2023-10-09 (×4): qty 1

## 2023-10-09 MED ORDER — ACETAMINOPHEN 500 MG PO TABS
1000.0000 mg | ORAL_TABLET | Freq: Three times a day (TID) | ORAL | Status: DC
  Administered 2023-10-09 – 2023-10-11 (×7): 1000 mg via ORAL
  Filled 2023-10-09 (×9): qty 2

## 2023-10-09 MED ORDER — SODIUM CHLORIDE 0.9 % IV SOLN: INTRAVENOUS | Status: DC

## 2023-10-09 NOTE — Progress Notes (Signed)
 Pt alert and oriented x4, c/o right hip pain, 2 tabs of norco given for moderate pain per pain scale and pt request to be premedicated prior to PT eval and movement. During PT eval pt became hypotensive, provider notified, pt asymptomatic, denied lightlessness/dizziness or pain. IVF initiated. Pt pain level remained below 5 during shift. Scheduled tylenol  and lidocaine  patch given. Pt did not require other prns during shift. Pt BP improved to 112/46 by end of shift and pt was able to work with PT and tolerated evaluation. Pt tolerating meals/ denies nausea or vomiting. Pt voiding. No BM during shift. Plan of care continued. Safety precautions maintained.

## 2023-10-09 NOTE — NC FL2 (Signed)
 [place]  MEDICAID FL2 LEVEL OF CARE FORM     IDENTIFICATION  Patient Name: Tammy Lindsey Birthdate: 1932/04/24 Sex: female Admission Date (Current Location): 10/08/2023  [facility] and [place] Number:  Producer, television/film/video and Address:  [facility],  [address] [place], [place] [address]      Provider Number: [id number]  Attending Physician Name and Address:  Enrigue Harvard, DO  Relative Name and Phone Number:  Monter,William Chip (Son)  [phone]    Current Level of Care: Hospital Recommended Level of Care: Skilled Nursing Facility Prior Approval Number:    Date Approved/Denied:   PASRR Number: [id number]  Discharge Plan: SNF    Current Diagnoses: Patient Active Problem List   Diagnosis Date Noted   Pubic bone fracture (HCC) 10/08/2023   Hyponatremia 10/08/2023   Leg cramps 10/08/2023   Systolic murmur 10/08/2023   Idiopathic neuropathy 07/04/2023   Hypertension 07/19/2022   Genetic testing 12/16/2021   Family history of breast cancer 11/25/2021   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   Lumbar radiculopathy 10/28/2020   Degeneration of lumbar intervertebral disc 06/02/2017   Numbness 07/02/2015   Major depressive disorder, single episode, in full remission (HCC) 06/09/2015   Varicose veins of bilateral lower extremities with other complications 07/18/2012   Chronic cough 01/22/2012    Orientation RESPIRATION BLADDER Height & Weight     Self, Time, Situation, Place  Normal Continent Weight: 120 lb 2.4 oz (54.5 kg) Height:  5' 2 (157.5 cm)  BEHAVIORAL SYMPTOMS/MOOD NEUROLOGICAL BOWEL NUTRITION STATUS      Continent Diet (See DC summary)  AMBULATORY STATUS COMMUNICATION OF NEEDS Skin   Limited Assist Verbally Normal                       Personal Care Assistance Level of Assistance  Bathing, Dressing, Feeding Bathing Assistance: Limited assistance Feeding assistance: Independent Dressing Assistance: Limited  assistance     Functional Limitations Info  Sight, Hearing, Speech Sight Info: Adequate Hearing Info: Impaired Speech Info: Adequate    SPECIAL CARE FACTORS FREQUENCY  PT (By licensed PT), OT (By licensed OT)     PT Frequency: 5x/wk OT Frequency: 5x/wk            Contractures Contractures Info: Not present    Additional Factors Info  Code Status, Allergies, Psychotropic Code Status Info: DNR Allergies Info: Arimidex  (Anastrozole ), Sulfa Antibiotics Psychotropic Info: Lexapro , remeron          Current Medications (10/09/2023):  This is the current hospital active medication list Current Facility-Administered Medications  Medication Dose Route Frequency Provider Last Rate Last Admin   0.9 %  sodium chloride  infusion   Intravenous Continuous Vann, Jessica U, DO 40 mL/hr at 10/09/23 1134 New Bag at 10/09/23 1134   acetaminophen  (TYLENOL ) tablet 1,000 mg  1,000 mg Oral TID Vann, Jessica U, DO       docusate sodium (COLACE) capsule 100 mg  100 mg Oral BID Raymona Caldwell, MD   100 mg at 10/09/23 0908   enoxaparin (LOVENOX) injection 40 mg  40 mg Subcutaneous Q24H Raymona Caldwell, MD   40 mg at 10/08/23 1650   escitalopram  (LEXAPRO ) tablet 5 mg  5 mg Oral Daily Raymona Caldwell, MD   5 mg at 10/09/23 0908   folic acid  (FOLVITE ) tablet 0.5 mg  500 mcg Oral QHS Raymona Caldwell, MD   0.5 mg at 10/08/23 2154   lidocaine  (LIDODERM ) 5 % 1 patch  1 patch Transdermal Q24H Raymona Caldwell, MD   1 patch at 10/08/23 1650   methocarbamol (ROBAXIN) tablet 500 mg  500 mg Oral TID Vann, Jessica U, DO       mirtazapine  (REMERON ) tablet 7.5 mg  7.5 mg Oral QHS Raymona Caldwell, MD   7.5 mg at 10/08/23 2153   ondansetron  (ZOFRAN ) tablet 4 mg  4 mg Oral Q6H PRN Raymona Caldwell, MD       Or   ondansetron  (ZOFRAN ) injection 4 mg  4 mg Intravenous Q6H PRN Raymona Caldwell, MD       oxyCODONE  (Oxy IR/ROXICODONE ) immediate release tablet 5 mg  5 mg Oral Q4H PRN Vann, Jessica U, DO       pantoprazole  (PROTONIX ) EC  tablet 40 mg  40 mg Oral Daily PRN Raymona Caldwell, MD         Discharge Medications: Please see discharge summary for a list of discharge medications.  Relevant Imaging Results:  Relevant Lab Results:   Additional Information SSN: [id number]  Marty Sleet, LCSW

## 2023-10-09 NOTE — TOC Initial Note (Signed)
 Transition of Care [facility]) - Initial/Assessment Note    Patient Details  Name: Tammy Lindsey MRN: [id number] Date of Birth: [birth year]  [facility]) CM/SW Contact:    Marty Sleet, LCSW Phone Number: 10/09/2023, 2:38 PM  Clinical Narrative:                 Pt from [facility] where she resides alone. Pt has RW she uses at baseline. Pt currently recommended for SNF. Pt agreeable to this recommendation and would like to go to SNF at [facility] prior to returning [place]. Spoke with [place] at [place] and confirmed discharge plan to SNF. No auth required as [facility] is not contracted with Medicare. Left VM for pt's son, Chip to discuss discharge plans.   Expected Discharge Plan: Skilled Nursing Facility Barriers to Discharge: No Barriers Identified   Patient Goals and CMS Choice Patient states their goals for this hospitalization and ongoing recovery are:: To return to [facility] CMS Medicare.gov Compare Post Acute Care list provided to:: Patient Choice offered to / list presented to : Patient [place] ownership interest in [facility]provided to:: Patient    Expected Discharge Plan and Services In-house Referral: Clinical Social Work Discharge Planning Services: NA Post Acute Care Choice: Skilled Nursing Facility Living arrangements for the past 2 months: Independent Living Facility                 DME Arranged: N/A DME Agency: NA                  Prior Living Arrangements/Services Living arrangements for the past 2 months: Independent Living Facility Lives with:: Self Patient language and need for interpreter reviewed:: Yes Do you feel safe going back to the place where you live?: Yes      Need for Family Participation in Patient Care: No (Comment) Care giver support system in place?: Yes (comment) Current home services: DME (RW, shower seat) Criminal Activity/Legal Involvement Pertinent to Current  Situation/Hospitalization: No - Comment as needed  Activities of Daily Living   ADL Screening (condition at time of admission) Independently performs ADLs?: Yes (appropriate for developmental age) Is the patient deaf or have difficulty hearing?: Yes Does the patient have difficulty seeing, even when wearing glasses/contacts?: No Does the patient have difficulty concentrating, remembering, or making decisions?: No  Permission Sought/Granted Permission sought to share information with : Facility Medical sales representative, Family Supports Permission granted to share information with : Yes, Verbal Permission Granted  Share Information with NAME: Ma,William Chip (Son)  [phone]  Permission granted to share info w AGENCY: [facility]        Emotional Assessment Appearance:: Appears stated age Attitude/Demeanor/Rapport: Engaged Affect (typically observed): Accepting Orientation: : Oriented to Self, Oriented to Place, Oriented to  Time, Oriented to Situation Alcohol / Substance Use: Not Applicable Psych Involvement: No (comment)  Admission diagnosis:  Hyponatremia [E87.1] Pubic bone fracture (HCC) [S32.509A] Multiple closed fractures of pelvis with stable disruption of pelvic ring, initial encounter (HCC) [S32.810A] Patient Active Problem List   Diagnosis Date Noted   Pubic bone fracture (HCC) 10/08/2023   Hyponatremia 10/08/2023   Leg cramps 10/08/2023   Systolic murmur 10/08/2023   Idiopathic neuropathy 07/04/2023   Hypertension 07/19/2022   Genetic testing 12/16/2021   Family history of breast cancer 11/25/2021   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 [place] [address] Phone: [phone] Fax: [phone]     Social Drivers of Health (SDOH) Social History: SDOH Screenings   Food Insecurity: No Food Insecurity (10/08/2023)  Housing: Low Risk  (10/08/2023)  Transportation Needs: No Transportation Needs (10/08/2023)  Utilities: Not At Risk (10/08/2023)  Alcohol Screen: Low Risk  (11/14/2022)  Depression (PHQ2-9): Low Risk  (07/24/2023)  Financial Resource Strain: Low Risk  (12/21/2021)  Social Connections: Unknown (10/08/2023)  Tobacco Use: Low Risk  (10/08/2023)   SDOH Interventions:     Readmission Risk Interventions     No data to display

## 2023-10-09 NOTE — Plan of Care (Signed)

## 2023-10-09 NOTE — Evaluation (Signed)
 Physical Therapy Evaluation Patient Details Name: Tammy Lindsey MRN: [id number] DOB: [birth year] Today's Date: 10/09/2023  History of Present Illness  Pt is 89 yo female admitted on 10/08/23 after fall with R hip pain.  Pt found to have pubic bone fracture (see imaging for detail). Ortho recommended conservative measures with WBAT.  Pt with hx including neuropathy, breast cancer, depression, varicose veins, hypertension  Clinical Impression  Pt admitted with above diagnosis. At baseline , pt resides at [facility] and is very independent, mobile, and active.  Her plan is to return to [facility] rehab at d/c. Today, pt unable to participate with OOB activity as BP was low at arrival with [facility] elevated and continued to decrease.  She had no pain and was able to move bil LE well with at least 3/5 strength throughout.  Attempted exercises to help improve BP but no change.  MD aware and RN present and managing pt at end of session.  Pt expected to progress very well once BP improves.  Pt currently with functional limitations due to the deficits listed below (see PT Problem List). Pt will benefit from acute skilled PT to increase their independence and safety with mobility to allow discharge.           If plan is discharge home, recommend the following: A lot of help with walking and/or transfers;A lot of help with bathing/dressing/bathroom   Can travel by private vehicle   No    Equipment Recommendations Rolling walker (2 wheels)  Recommendations for Other Services       Functional Status Assessment Patient has had a recent decline in their functional status and demonstrates the ability to make significant improvements in function in a reasonable and predictable amount of time.     Precautions / Restrictions Precautions Precautions: Fall Restrictions Weight Bearing Restrictions Per Provider Order: Yes RLE Weight Bearing Per Provider Order: Weight bearing as tolerated LLE Weight Bearing Per  Provider Order: Weight bearing as tolerated      Mobility  Bed Mobility               General bed mobility comments: Unable due to low BP    Transfers                        Ambulation/Gait                  Stairs            Wheelchair Mobility     Tilt Bed    Modified Rankin (Stroke Patients Only)       Balance Overall balance assessment:  (not able to assess)                                           Pertinent Vitals/Pain Pain Assessment Pain Assessment: No/denies pain    Home Living Family/patient expects to be discharged to:: Private residence Living Arrangements: Alone Available Help at Discharge: Family;Available PRN/intermittently Type of Home: Independent living facility ([facility]F [facility]) Home Access: Level entry       Home Layout: One level Home Equipment: Agricultural consultant (2 wheels);Shower seat;Grab bars - tub/shower      Prior Function Prior Level of Function : Independent/Modified Independent;Driving             Mobility Comments: ambulates with RW; can ambulate in community ADLs Comments:  independent adls and iadls; does her own driving and shopping     Extremity/Trunk Assessment   Upper Extremity Assessment Upper Extremity Assessment: LUE deficits/detail;RUE deficits/detail RUE Deficits / Details: ROM WFL; MMT grossly 4+/5 throughout LUE Deficits / Details: ROM: shoulder limited to 90 degrees; MMT: grossly 4-/5 throughout    Lower Extremity Assessment Lower Extremity Assessment: LLE deficits/detail;RLE deficits/detail RLE Deficits / Details: Assessed in supine; ROM WFL; MMT at least 3/5 throughout, not further tested LLE Deficits / Details: Assessed in supine; ROM WFL; MMT at least 3/5 throughout, not further tested    Cervical / Trunk Assessment Cervical / Trunk Assessment: Normal  Communication   Communication Factors Affecting Communication: Hearing impaired    Cognition  Arousal: Alert Behavior During Therapy: WFL for tasks assessed/performed   PT - Cognitive impairments: No apparent impairments                       PT - Cognition Comments: Reports feels loopy         Cueing       General Comments  At arrival pt in bed with HOB ~50 degrees.  Prior BP was 132/53.  Had noted orders to check orthostatic vitals.  So took BP.  Supine in bed with HOB at 50 degrees BP was 88/43 (56) and HR 64.  Laid bed flat and rechecked after 3 mins and down to 82/40 (54) HR 61 in L arm; tried R arm 78/39 (51) HR 63; Placed in trendlenberg and notified RN.  Once in trendlenberg 83/41 HR 68.  RN present and notified MD.  Jannis Merchant 3 mins and after exercises 84/43 (57) HR 68. Pt wanted some water and to elevate head some.  Tried elevating just to 10 degrees and gave pt small sip water BP 72/37 (49) and HR 100 bpm.  Placed back in trendlenberg.  RN present and starting fluids.  Left in care of RN.     Exercises General Exercises - Lower Extremity Ankle Circles/Pumps: AROM, Both, 20 reps, Supine Quad Sets: AROM, Both, 20 reps, Supine Heel Slides: AROM, Both, Supine (5x2) Straight Leg Raises: AROM, Both, Supine (5x2)   Assessment/Plan    PT Assessment Patient needs continued PT services  PT Problem List Decreased strength;Cardiopulmonary status limiting activity;Decreased range of motion;Decreased activity tolerance;Decreased knowledge of use of DME;Decreased balance;Decreased mobility       PT Treatment Interventions DME instruction;Therapeutic exercise;Gait training;Functional mobility training;Therapeutic activities;Patient/family education;Balance training;Modalities    PT Goals (Current goals can be found in the Care Plan section)  Acute Rehab PT Goals Patient Stated Goal: return to [facility] PT Goal Formulation: With patient Time For Goal Achievement: 10/23/23 Potential to Achieve Goals: Good    Frequency Min 2X/week     Co-evaluation                AM-PAC PT 6 Clicks Mobility  Outcome Measure Help needed turning from your back to your side while in a flat bed without using bedrails?: A Little Help needed moving from lying on your back to sitting on the side of a flat bed without using bedrails?: Total Help needed moving to and from a bed to a chair (including a wheelchair)?: Total Help needed standing up from a chair using your arms (e.g., wheelchair or bedside chair)?: Total Help needed to walk in hospital room?: Total Help needed climbing 3-5 steps with a railing? : Total 6 Click Score: 8    End of Session   Activity Tolerance:  Treatment limited secondary to medical complications (Comment) Patient left: in bed;with call bell/phone within reach;with nursing/sitter in room Nurse Communication: Mobility status;Other (comment) (bp) PT Visit Diagnosis: Other abnormalities of gait and mobility (R26.89);Muscle weakness (generalized) (M62.81)    Time: 0981-1914 PT Time Calculation (min) (ACUTE ONLY): 33 min   Charges:   PT Evaluation $PT Eval Moderate Complexity: 1 Mod PT Treatments $Therapeutic Exercise: 8-22 mins PT General Charges $$ ACUTE PT VISIT: 1 Visit         Cyd Dowse, PT Acute Rehab [facility] [phone]   Carolynn Citrin 10/09/2023, 12:56 PM

## 2023-10-09 NOTE — Progress Notes (Signed)
 PROGRESS NOTE    Tammy Lindsey  NGE:[id number] DOB: [birth year] DOA: 10/08/2023 PCP: Marguerite Shiley, MD    Brief Narrative:  Tammy Lindsey is a 89 y.o. female with medical history significant of neuropathy, breast cancer, depression, varicose veins, hypertension, chronic cough who presented to ED with right hip pain after a fall. She states on Saturday she was walking in her bedroom and reached for the bed post and must have missed because the next thing she knows she was on the ground. Fell hard onto her right hip/buttocks area. She was able to get up and continue to do her daily activities. The pain got more severe so she decided she better get checked out. Pain was severe, but with medication has come down to a 6/10. She has pain over her right hip and in pubic area. She can't move her right leg due to the pain. She denies any prodromal symptoms, loss of consciousness. Specifically denies light headedness, dizziness, chest pain, palpitations or shortness of breath.    Assessment and Plan: * Pubic bone fracture (HCC) -ortho consulted by EDP, Dr. Deeann Fare who recommended WBAT, PT and pain control. 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 Scherrie Curt   Idiopathic neuropathy ? Gabapenin in PCP note, but did not mention on this medrec   Major depressive disorder, single episode, in full remission (HCC) Continue lexapro  and remeron   - May need to stop if continues to have hyponatremia  DVT prophylaxis: enoxaparin (LOVENOX) injection 40 mg Start: 10/08/23 1800    Code Status: Limited: Do not attempt resuscitation (DNR) -DNR-LIMITED -Do Not Intubate/DNI  Family Communication:   Disposition Plan:  Level of care: Telemetry Status is: Observation     Consultants:  Ortho (phone)   Subjective: No pain at rest but is nervous to get up with therapy  Objective: Vitals:   10/08/23 1942 10/08/23 2331 10/09/23 0414 10/09/23 0909  BP: (!) 101/46 (!) 109/43 131/80 (!) 132/53  Pulse: 60 62 64   Resp: 14 16 15  (!) 24  Temp: 98 F (36.7 C) 97.9 F (36.6 C) 98.2 F (36.8 C)   TempSrc:   Oral   SpO2: 93% 97% 97%   Weight:      Height:        Intake/Output Summary (Last 24 hours) at 10/09/2023 1022 Last data filed at 10/09/2023 1012 Gross per 24 hour  Intake 980.14 ml  Output 650 ml  Net 330.14 ml   Filed Weights   10/08/23 1032 10/08/23 1531  Weight: 52.2 kg 54.5 kg    Examination:   General: Appearance:    Well developed, well nourished female in no acute distress     Lungs:     respirations unlabored,  dry cough noted  Heart:    Normal heart rate.    MS:   All extremities are intact.    Neurologic:   Awake, alert       Data Reviewed: I have personally reviewed following labs and imaging studies  CBC: Recent Labs  Lab 10/08/23 1149  WBC 10.1  NEUTROABS 8.4*  HGB 12.8  HCT 36.4  MCV 79.6*  PLT 239   Basic Metabolic Panel: Recent Labs  Lab 10/08/23 1149 10/08/23 1646 10/09/23 0509  NA 125*  --  125*  K 4.4  --  4.0  CL 90*  --  96*  CO2 21*  --  23  GLUCOSE 99  --  95  BUN 9  --  12  CREATININE 0.60  --  0.53  CALCIUM 9.3  --  8.0*  MG  --  1.9  --    GFR: Estimated Creatinine  Clearance: 37 mL/min (by C-G formula based on SCr of 0.53 mg/dL). Liver Function Tests: No results for input(s): AST, ALT, ALKPHOS, BILITOT, PROT, ALBUMIN in the last 168 hours. No results for input(s): LIPASE, AMYLASE in the last 168 hours. No results for input(s): AMMONIA in the last 168 hours. Coagulation Profile: Recent Labs  Lab 10/08/23 1149  INR 1.1   Cardiac Enzymes: Recent Labs  Lab 10/08/23 1646  CKTOTAL 89   BNP (last 3 results) No results for input(s): PROBNP in the last 8760 hours. HbA1C: No results for input(s): HGBA1C in the last 72 hours. CBG: No results for input(s): GLUCAP in the last 168 hours. Lipid Profile: No results for input(s): CHOL, HDL, LDLCALC, TRIG, CHOLHDL, LDLDIRECT in the last 72 hours. Thyroid  Function Tests: No results for input(s): TSH, T4TOTAL, FREET4, T3FREE, THYROIDAB in the last 72 hours. Anemia Panel: Recent Labs    10/08/23 1646  VITAMINB12 1,872*  FERRITIN 281   Sepsis Labs: No results for input(s): PROCALCITON, LATICACIDVEN in the last 168 hours.  No results found for this or any previous visit (from the past 240 hours).       Radiology Studies: DG Hip Unilat W or Wo Pelvis 2-3 Views Right Result Date: 10/08/2023 CLINICAL DATA:  Fall last night.  Right hip pain. EXAM: DG HIP (WITH OR WITHOUT PELVIS) 2-3V RIGHT COMPARISON:  KUB 10/25/2021 FINDINGS: There is diffuse decreased bone mineralization. There is acute fracture of the junction of the pubic body and the superior right pubic ramus with approximately 7 mm inferior displacement of the pubic body with respect to the superior pubic ramus. Additional nondisplaced fracture line lucency overlying the right inferior pubic ramus with minimal 1 mm superior cortical step-off. Moderate to severe pubic symphysis joint space narrowing, subchondral sclerosis, mild peripheral osteophytosis. Mild right femoroacetabular joint space narrowing  and peripheral osteophytosis. Calcifications overlying the left hemipelvis compatible with vascular phleboliths versus fibroid calcifications, unchanged. IMPRESSION: 1. Acute fracture of the junction of the pubic body and the superior right pubic ramus with approximately 7 mm inferior displacement of the pubic body with respect to the superior pubic ramus. 2. Additional nondisplaced acute fracture of the right inferior pubic ramus. 3. Mild right femoroacetabular osteoarthritis. Electronically Signed   By: Bertina Broccoli M.D.   On: 10/08/2023 11:04        Scheduled Meds:  amLODipine   2.5 mg Oral Q breakfast   atenolol   25 mg Oral QHS   docusate sodium  100 mg Oral BID   enoxaparin (LOVENOX) injection  40 mg Subcutaneous Q24H   escitalopram   5 mg Oral Daily   folic acid   500 mcg Oral QHS   lidocaine   1 patch Transdermal Q24H   methocarbamol  500 mg Oral TID   mirtazapine   7.5 mg Oral QHS   Continuous Infusions:   LOS: 0 days    Time spent: 45 minutes minutes spent on chart review, discussion with nursing staff, consultants, updating family and interview/physical exam; more than 50% of that time was spent in counseling and/or coordination of care.    Enrigue Harvard, DO [facility] Available via Epic secure chat 7am-7pm After these hours, please refer to coverage provider listed on [email or web address] 10/09/2023, 10:22 AM

## 2023-10-09 NOTE — Progress Notes (Signed)
 Physical Therapy Treatment Patient Details Name: Tammy Lindsey MRN: [id number] DOB: Jun 06, 1931 Today's Date: 10/09/2023   History of Present Illness Pt is 89 yo female admitted on 10/08/23 after fall with R hip pain.  Pt found to have pubic bone fracture (see imaging for detail). Ortho recommended conservative measures with WBAT.  Pt with hx including neuropathy, breast cancer, depression, varicose veins, hypertension    PT Comments  Pt received fluid bolus and no further pain meds since am session.  BP much improved at rest and were stable throughout session with activity (see comments).  Pt required mod A for bed mobility and was able to ambulate 5'x2 with min A.   Did have incontinence during transfer requiring increased time for transfers and assistance.  Good tolerance for activity and exercises.  Cont POC.    If plan is discharge home, recommend the following: A lot of help with walking and/or transfers;A lot of help with bathing/dressing/bathroom   Can travel by private vehicle     No  Equipment Recommendations  Rolling walker (2 wheels)    Recommendations for Other Services       Precautions / Restrictions Precautions Precautions: Fall Restrictions RLE Weight Bearing Per Provider Order: Weight bearing as tolerated LLE Weight Bearing Per Provider Order: Weight bearing as tolerated     Mobility  Bed Mobility Overal bed mobility: Needs Assistance Bed Mobility: Supine to Sit, Sit to Supine     Supine to sit: Mod assist Sit to supine: Min assist   General bed mobility comments: Unable due to low BP    Transfers Overall transfer level: Needs assistance Equipment used: Rolling walker (2 wheels) Transfers: Sit to/from Stand Sit to Stand: Min assist           General transfer comment: cues for hand placement; STS from bed and BSC; incontinence in transfer to [facility] requiring increased time ,  assist with toileting ADLs    Ambulation/Gait Ambulation/Gait assistance:  Min assist Gait Distance (Feet): 5 Feet (5'x2) Assistive device: Rolling walker (2 wheels) Gait Pattern/deviations: Step-to pattern, Decreased stride length, Shuffle, Trunk flexed Gait velocity: decreased     General Gait Details: genu varus and bil hip IR; assist for balance and RW   Stairs             Wheelchair Mobility     Tilt Bed    Modified Rankin (Stroke Patients Only)       Balance Overall balance assessment: Needs assistance Sitting-balance support: No upper extremity supported, Bilateral upper extremity supported Sitting balance-Leahy Scale: Fair Sitting balance - Comments: Started with bil UE and posterior lean progressed to no UE support and SBA   Standing balance support: Bilateral upper extremity supported Standing balance-Leahy Scale: Poor Standing balance comment: RW and min A                            Communication Communication Factors Affecting Communication: Hearing impaired  Cognition Arousal: Alert Behavior During Therapy: WFL for tasks assessed/performed   PT - Cognitive impairments: No apparent impairments                       PT - Cognition Comments: Reports feeling better - no longer loopy        Cueing    Exercises General Exercises - Lower Extremity Ankle Circles/Pumps: AROM, Both, 20 reps, Seated Quad Sets: AROM, Both, 20 reps, Supine Long Arc Quad: AROM, Both,  10 reps, Seated Heel Slides: AROM, Both, Supine (5x2) Straight Leg Raises: AROM, Both, Supine (5x2) Hip Flexion/Marching: AROM, Both, 10 reps, Seated    General Comments General comments (skin integrity, edema, etc.): BP supine 116/53, sitting 105/46, on bsc after tx 125/51      Pertinent Vitals/Pain Pain Assessment Pain Assessment: 0-10 Pain Score: 2  Pain Location: R hip Pain Descriptors / Indicators: Discomfort Pain Intervention(s): Limited activity within patient's tolerance, Monitored during session, Repositioned    Home Living                           Prior Function            PT Goals (current goals can now be found in the care plan section) Progress towards PT goals: Progressing toward goals    Frequency    Min 2X/week      PT Plan      Co-evaluation              AM-PAC PT 6 Clicks Mobility   Outcome Measure  Help needed turning from your back to your side while in a flat bed without using bedrails?: A Little Help needed moving from lying on your back to sitting on the side of a flat bed without using bedrails?: A Lot Help needed moving to and from a bed to a chair (including a wheelchair)?: A Little Help needed standing up from a chair using your arms (e.g., wheelchair or bedside chair)?: A Little Help needed to walk in hospital room?: Total Help needed climbing 3-5 steps with a railing? : Total 6 Click Score: 13    End of Session Equipment Utilized During Treatment: Gait belt Activity Tolerance: Patient tolerated treatment well Patient left: in bed;with call bell/phone within reach;with bed alarm set (returned to bed as IV team coming) Nurse Communication: Mobility status PT Visit Diagnosis: Other abnormalities of gait and mobility (R26.89);Muscle weakness (generalized) (M62.81)     Time: 0454-0981 PT Time Calculation (min) (ACUTE ONLY): 38 min  Charges:    $Gait Training: 8-22 mins $Therapeutic Exercise: 8-22 mins $Therapeutic Activity: 8-22 mins PT General Charges $$ ACUTE PT VISIT: 1 Visit                     Cyd Dowse, PT Acute Rehab [facility] [phone]    Carolynn Citrin 10/09/2023, 5:26 PM

## 2023-10-09 NOTE — Care Management Obs Status (Signed)
 MEDICARE OBSERVATION STATUS NOTIFICATION   Patient Details  Name: Tammy Lindsey MRN: [id number] Date of Birth: [birth year]   Medicare Observation Status Notification Given:  Yes    Marty Sleet, LCSW 10/09/2023, 2:30 PM

## 2023-10-10 DIAGNOSIS — Z7401 Bed confinement status: Secondary | ICD-10-CM | POA: Diagnosis not present

## 2023-10-10 DIAGNOSIS — E222 Syndrome of inappropriate secretion of antidiuretic hormone: Secondary | ICD-10-CM | POA: Diagnosis not present

## 2023-10-10 DIAGNOSIS — I10 Essential (primary) hypertension: Secondary | ICD-10-CM | POA: Diagnosis not present

## 2023-10-10 DIAGNOSIS — S32511A Fracture of superior rim of right pubis, initial encounter for closed fracture: Secondary | ICD-10-CM | POA: Diagnosis not present

## 2023-10-10 DIAGNOSIS — S32810A Multiple fractures of pelvis with stable disruption of pelvic ring, initial encounter for closed fracture: Secondary | ICD-10-CM | POA: Diagnosis not present

## 2023-10-10 DIAGNOSIS — F325 Major depressive disorder, single episode, in full remission: Secondary | ICD-10-CM | POA: Diagnosis not present

## 2023-10-10 DIAGNOSIS — Z66 Do not resuscitate: Secondary | ICD-10-CM | POA: Diagnosis not present

## 2023-10-10 DIAGNOSIS — G609 Hereditary and idiopathic neuropathy, unspecified: Secondary | ICD-10-CM | POA: Diagnosis not present

## 2023-10-10 DIAGNOSIS — W1839XA Other fall on same level, initial encounter: Secondary | ICD-10-CM | POA: Diagnosis present

## 2023-10-10 DIAGNOSIS — Z79811 Long term (current) use of aromatase inhibitors: Secondary | ICD-10-CM | POA: Diagnosis not present

## 2023-10-10 DIAGNOSIS — E871 Hypo-osmolality and hyponatremia: Secondary | ICD-10-CM | POA: Diagnosis not present

## 2023-10-10 DIAGNOSIS — Y92003 Bedroom of unspecified non-institutional (private) residence as the place of occurrence of the external cause: Secondary | ICD-10-CM | POA: Diagnosis not present

## 2023-10-10 DIAGNOSIS — S32591A Other specified fracture of right pubis, initial encounter for closed fracture: Secondary | ICD-10-CM | POA: Diagnosis not present

## 2023-10-10 DIAGNOSIS — R053 Chronic cough: Secondary | ICD-10-CM | POA: Diagnosis not present

## 2023-10-10 DIAGNOSIS — Z86000 Personal history of in-situ neoplasm of breast: Secondary | ICD-10-CM | POA: Diagnosis not present

## 2023-10-10 DIAGNOSIS — E559 Vitamin D deficiency, unspecified: Secondary | ICD-10-CM | POA: Diagnosis not present

## 2023-10-10 LAB — CBC
HCT: 33.2 % — ABNORMAL LOW (ref 36.0–46.0)
Hemoglobin: 11.1 g/dL — ABNORMAL LOW (ref 12.0–15.0)
MCH: 28.3 pg (ref 26.0–34.0)
MCHC: 33.4 g/dL (ref 30.0–36.0)
MCV: 84.7 fL (ref 80.0–100.0)
Platelets: 160 10*3/uL (ref 150–400)
RBC: 3.92 MIL/uL (ref 3.87–5.11)
RDW: 13.3 % (ref 11.5–15.5)
WBC: 7 10*3/uL (ref 4.0–10.5)
nRBC: 0 % (ref 0.0–0.2)

## 2023-10-10 LAB — BASIC METABOLIC PANEL WITH GFR
Anion gap: 10 (ref 5–15)
BUN: 16 mg/dL (ref 8–23)
CO2: 19 mmol/L — ABNORMAL LOW (ref 22–32)
Calcium: 7.9 mg/dL — ABNORMAL LOW (ref 8.9–10.3)
Chloride: 99 mmol/L (ref 98–111)
Creatinine, Ser: 0.57 mg/dL (ref 0.44–1.00)
GFR, Estimated: >60 mL/min
Glucose, Bld: 95 mg/dL (ref 70–99)
Potassium: 3.8 mmol/L (ref 3.5–5.1)
Sodium: 128 mmol/L — ABNORMAL LOW (ref 135–145)

## 2023-10-10 LAB — CORTISOL: Cortisol, Plasma: 13.1 ug/dL

## 2023-10-10 MED ORDER — POLYETHYLENE GLYCOL 3350 17 G PO PACK
17.0000 g | PACK | Freq: Every day | ORAL | Status: DC
  Administered 2023-10-11: 17 g via ORAL
  Filled 2023-10-10 (×3): qty 1

## 2023-10-10 MED ORDER — SENNOSIDES-DOCUSATE SODIUM 8.6-50 MG PO TABS
1.0000 | ORAL_TABLET | Freq: Once | ORAL | Status: AC
  Administered 2023-10-10: 1 via ORAL
  Filled 2023-10-10: qty 1

## 2023-10-10 NOTE — Progress Notes (Signed)
 Mobility Specialist - Progress Note   10/10/23 1030  Mobility  Activity Ambulated with assistance in room  Level of Assistance Minimal assist, patient does 75% or more  Assistive Device Front wheel walker  Distance Ambulated (ft) 30 ft  Range of Motion/Exercises Active  RLE Weight Bearing Per Provider Order WBAT  LLE Weight Bearing Per Provider Order WBAT  Activity Response Tolerated well  Mobility Referral Yes  Mobility visit 1 Mobility  Mobility Specialist Start Time (ACUTE ONLY) 1010  Mobility Specialist Stop Time (ACUTE ONLY) 1030  Mobility Specialist Time Calculation (min) (ACUTE ONLY) 20 min   Pt was found in bed and agreeable to ambulate. No complaints with session. At EOS returned to recliner chair with all needs met. Call bell in reach and RN notified.  Lorna Rose,  Mobility Specialist Can be reached via Secure Chat

## 2023-10-10 NOTE — Plan of Care (Signed)

## 2023-10-10 NOTE — Progress Notes (Signed)
 PROGRESS NOTE    DEIONDRA DENLEY  QMV:[id number] DOB: [birth year] DOA: 10/08/2023 PCP: Marguerite Shiley, MD    Brief Narrative:  Tammy Lindsey is a 89 y.o. female with medical history significant of neuropathy, breast cancer, depression, varicose veins, hypertension, chronic cough who presented to ED with right hip pain after a fall. She states on Saturday she was walking in her bedroom and reached for the bed post and must have missed because the next thing she knows she was on the ground. Fell hard onto her right hip/buttocks area. She was able to get up and continue to do her daily activities. The pain got more severe so she decided she better get checked out. Pain was severe, but with medication has come down to a 6/10. She has pain over her right hip and in pubic area. She can't move her right leg due to the pain. She denies any prodromal symptoms, loss of consciousness. Specifically denies light headedness, dizziness, chest pain, palpitations or shortness of breath.    Assessment and Plan: * Pubic bone fracture (HCC) -ortho consulted by EDP, Dr. Deeann Fare who recommended WBAT, PT and pain control. 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 Scherrie Curt   Idiopathic neuropathy ? Gabapenin in PCP note, but did not mention on this medrec   Major  depressive disorder, single episode, in full remission (HCC) Continue lexapro  and remeron       DVT prophylaxis: Place TED hose Start: 10/09/23 1145 enoxaparin (LOVENOX) injection 40 mg Start: 10/08/23 1800    Code Status: Limited: Do not attempt resuscitation (DNR) -DNR-LIMITED -Do Not Intubate/DNI  Family Communication: Spoke with son on the phone  Disposition Plan:  Level of care: Telemetry   Patient from [facility] and will need to return to [facility] but in the skilled portion for short-term rehab  Consultants:  Ortho (phone)   Subjective: Doing well, no complaints  Objective: Vitals:   10/09/23 1500 10/09/23 1837 10/09/23 2237 10/10/23 0247  BP: (!) 116/53 (!) 112/46 (!) 125/54 139/61  Pulse: 69 66 68 63  Resp:  18 18 18   Temp:   98.2 F (36.8 C)   TempSrc:      SpO2:   98% 98%  Weight:      Height:        Intake/Output Summary (Last 24 hours) at 10/10/2023 1037 Last data filed at 10/10/2023 0912 Gross per 24 hour  Intake 720.67 ml  Output 400 ml  Net 320.67 ml   Filed Weights   10/08/23 1032 10/08/23 1531  Weight: 52.2 kg 54.5 kg    Examination:   General: Appearance:    Well developed, well nourished female in no acute distress     Lungs:     respirations unlabored, dry cough noted  Heart:    Normal heart rate.    MS:   All extremities are intact.  Neurologic:   Awake, alert       Data Reviewed: I have personally reviewed following labs and imaging studies  CBC: Recent Labs  Lab 10/08/23 1149 10/10/23 0602  WBC 10.1 7.0  NEUTROABS 8.4*  --   HGB 12.8 11.1*  HCT 36.4 33.2*  MCV 79.6* 84.7  PLT 239 160   Basic Metabolic Panel: Recent Labs  Lab 10/08/23 1149 10/08/23 1646 10/09/23 0509 10/10/23 0602  NA 125*  --  125* 128*  K 4.4  --  4.0 3.8  CL 90*  --  96* 99  CO2 21*  --  23 19*  GLUCOSE 99  --  95 95  BUN 9  --  12 16  CREATININE 0.60  --  0.53 0.57  CALCIUM 9.3  --  8.0* 7.9*  MG  --  1.9  --   --     GFR: Estimated Creatinine Clearance: 37 mL/min (by C-G formula based on SCr of 0.57 mg/dL). Liver Function Tests: No results for input(s): AST, ALT, ALKPHOS, BILITOT, PROT, ALBUMIN in the last 168 hours. No results for input(s): LIPASE, AMYLASE in the last 168 hours. No results for input(s): AMMONIA in the last 168 hours. Coagulation Profile: Recent Labs  Lab 10/08/23 1149  INR 1.1   Cardiac Enzymes: Recent Labs  Lab 10/08/23 1646  CKTOTAL 89   BNP (last 3 results) No results for input(s): PROBNP in the last 8760 hours. HbA1C: No results for input(s): HGBA1C in the last 72 hours. CBG: No results for input(s): GLUCAP in the last 168 hours. Lipid Profile: No results for input(s): CHOL, HDL, LDLCALC, TRIG, CHOLHDL, LDLDIRECT in the last 72 hours. Thyroid  Function Tests: No results for input(s): TSH, T4TOTAL, FREET4, T3FREE, THYROIDAB in the last 72 hours. Anemia Panel: Recent Labs    10/08/23 1646  VITAMINB12 1,872*  FERRITIN 281   Sepsis Labs: No results for input(s): PROCALCITON, LATICACIDVEN in the last 168 hours.  No results found for this or any previous visit (from the past 240 hours).       Radiology Studies: DG Hip Unilat W or Wo Pelvis 2-3 Views Right Result Date: 10/08/2023 CLINICAL DATA:  Fall last night.  Right hip pain. EXAM: DG HIP (WITH OR WITHOUT PELVIS) 2-3V RIGHT COMPARISON:  KUB 10/25/2021 FINDINGS: There is diffuse decreased bone mineralization. There is acute fracture of the junction of the pubic body and the superior right pubic ramus with approximately 7 mm inferior displacement of the pubic body with respect to the superior pubic ramus. Additional nondisplaced fracture line lucency overlying the right inferior pubic ramus with minimal 1 mm superior cortical step-off. Moderate to severe pubic symphysis joint space narrowing, subchondral sclerosis, mild peripheral osteophytosis. Mild right  femoroacetabular joint space narrowing and peripheral osteophytosis. Calcifications overlying the left hemipelvis compatible with vascular phleboliths versus fibroid calcifications, unchanged. IMPRESSION: 1. Acute fracture of the junction of the pubic body and the superior right pubic ramus with approximately 7 mm inferior displacement of the pubic body with respect to the superior pubic ramus. 2. Additional nondisplaced acute fracture of the right inferior pubic ramus. 3. Mild right femoroacetabular osteoarthritis. Electronically Signed   By: Bertina Broccoli M.D.   On: 10/08/2023 11:04        Scheduled Meds:  acetaminophen   1,000 mg Oral TID   docusate sodium  100 mg Oral BID   enoxaparin (LOVENOX) injection  40 mg Subcutaneous Q24H   escitalopram   5 mg Oral Daily   folic acid   500 mcg Oral QHS   lidocaine   1 patch Transdermal Q24H   methocarbamol  500 mg Oral TID   mirtazapine   7.5 mg Oral QHS   Continuous Infusions:  sodium chloride  40 mL/hr at 10/09/23 1751     LOS: 0 days    Time spent: 45 minutes minutes spent on chart review, discussion with nursing staff, consultants, updating family and interview/physical exam; more than 50% of that time was spent in counseling and/or coordination of care.    Enrigue Harvard, DO [facility] Available via Epic secure chat 7am-7pm After these hours, please refer to coverage provider listed on [email or web address] 10/10/2023, 10:37 AM

## 2023-10-11 DIAGNOSIS — I10 Essential (primary) hypertension: Secondary | ICD-10-CM

## 2023-10-11 DIAGNOSIS — R053 Chronic cough: Secondary | ICD-10-CM | POA: Diagnosis not present

## 2023-10-11 DIAGNOSIS — E871 Hypo-osmolality and hyponatremia: Secondary | ICD-10-CM | POA: Diagnosis not present

## 2023-10-11 DIAGNOSIS — S32810A Multiple fractures of pelvis with stable disruption of pelvic ring, initial encounter for closed fracture: Secondary | ICD-10-CM | POA: Diagnosis not present

## 2023-10-11 DIAGNOSIS — D0512 Intraductal carcinoma in situ of left breast: Secondary | ICD-10-CM

## 2023-10-11 LAB — TSH: TSH: 0.947 u[IU]/mL (ref 0.350–4.500)

## 2023-10-11 LAB — BASIC METABOLIC PANEL WITH GFR
Anion gap: 5 (ref 5–15)
BUN: 13 mg/dL (ref 8–23)
CO2: 24 mmol/L (ref 22–32)
Calcium: 8.2 mg/dL — ABNORMAL LOW (ref 8.9–10.3)
Chloride: 98 mmol/L (ref 98–111)
Creatinine, Ser: 0.45 mg/dL (ref 0.44–1.00)
GFR, Estimated: >60 mL/min (ref >60)
Glucose, Bld: 96 mg/dL (ref 70–99)
Potassium: 4.6 mmol/L (ref 3.5–5.1)
Sodium: 127 mmol/L — ABNORMAL LOW (ref 135–145)

## 2023-10-11 LAB — OSMOLALITY: Osmolality: 279 mosm/kg (ref 275–295)

## 2023-10-11 LAB — SODIUM, URINE, RANDOM: Sodium, Ur: 54 mmol/L

## 2023-10-11 LAB — OSMOLALITY, URINE: Osmolality, Ur: 593 mosm/kg (ref 300–900)

## 2023-10-11 MED ORDER — VITAMIN D3 1000 UNITS PO CAPS: 2000.0000 [IU] | ORAL_CAPSULE | Freq: Every morning | ORAL | Status: DC

## 2023-10-11 MED ORDER — VITAMIN D3 1000 UNITS PO CAPS: 1000.0000 [IU] | ORAL_CAPSULE | Freq: Every morning | ORAL | Status: DC

## 2023-10-11 MED ORDER — HYDROCODONE BIT-HOMATROP MBR 5-1.5 MG/5ML PO SOLN: 5.0000 mL | Freq: Every evening | ORAL | Status: DC | PRN

## 2023-10-11 MED ORDER — SODIUM CHLORIDE 1 G PO TABS
1.0000 g | ORAL_TABLET | Freq: Three times a day (TID) | ORAL | Status: DC
  Administered 2023-10-11 (×3): 1 g via ORAL
  Filled 2023-10-11 (×4): qty 1

## 2023-10-11 MED ORDER — METHOCARBAMOL 500 MG PO TABS: 500.0000 mg | ORAL_TABLET | Freq: Three times a day (TID) | ORAL | Status: DC

## 2023-10-11 MED ORDER — VITAMIN B-12 1000 MCG PO TABS
1000.0000 ug | ORAL_TABLET | Freq: Every day | ORAL | Status: DC
  Administered 2023-10-11: 1000 ug via ORAL
  Filled 2023-10-11 (×2): qty 1

## 2023-10-11 MED ORDER — DOCUSATE SODIUM 100 MG PO CAPS
100.0000 mg | ORAL_CAPSULE | Freq: Two times a day (BID) | ORAL | Status: DC
Start: 2023-10-11 — End: 2024-03-15

## 2023-10-11 MED ORDER — POLYETHYLENE GLYCOL 3350 17 G PO PACK: 17.0000 g | PACK | Freq: Every day | ORAL | Status: DC

## 2023-10-11 MED ORDER — LIDOCAINE 5 % EX PTCH: 1.0000 | MEDICATED_PATCH | CUTANEOUS | Status: DC

## 2023-10-11 MED ORDER — OXYCODONE HCL 5 MG PO TABS: 5.0000 mg | ORAL_TABLET | ORAL | 0 refills | Status: DC | PRN

## 2023-10-11 MED ORDER — VITAMIN B-12 1000 MCG PO TABS: 1000.0000 ug | ORAL_TABLET | Freq: Every day | ORAL | Status: DC

## 2023-10-11 MED ORDER — AMLODIPINE BESYLATE 5 MG PO TABS
2.5000 mg | ORAL_TABLET | Freq: Every day | ORAL | Status: DC
Start: 2023-10-11 — End: 2023-10-23

## 2023-10-11 MED ORDER — SODIUM CHLORIDE 1 G PO TABS: 1.0000 g | ORAL_TABLET | Freq: Three times a day (TID) | ORAL | Status: AC

## 2023-10-11 MED ORDER — VITAMIN D3 25 MCG (1000 UNIT) PO TABS
2000.0000 [IU] | ORAL_TABLET | Freq: Every day | ORAL | Status: DC
  Administered 2023-10-11: 2000 [IU] via ORAL
  Filled 2023-10-11 (×2): qty 2

## 2023-10-11 MED ORDER — ONDANSETRON HCL 4 MG PO TABS: 4.0000 mg | ORAL_TABLET | Freq: Four times a day (QID) | ORAL | Status: DC | PRN

## 2023-10-11 NOTE — Plan of Care (Signed)
   Problem: Nutrition: Goal: Adequate nutrition will be maintained Outcome: Progressing   Problem: Pain Managment: Goal: General experience of comfort will improve and/or be controlled Outcome: Progressing   Problem: Safety: Goal: Ability to remain free from injury will improve Outcome: Progressing

## 2023-10-11 NOTE — Progress Notes (Signed)
 [facility]                                                                              Tammy Lindsey, is a 89 y.o. female, DOB - 1931-08-26, JXB:[id number] Admit date - 10/08/2023    Outpatient Primary MD for the patient is Marguerite Shiley, MD  LOS - 1  days  Chief Complaint  Patient presents with   Fall       Brief summary   Patient is a 89 y.o. female with medical history significant of neuropathy, breast cancer, depression, varicose veins, hypertension, chronic cough who presented to ED with right hip pain after a fall. She states on Saturday she was walking in her bedroom and reached for the bed post and must have missed because the next thing she knows she was on the ground. Fell hard onto her right hip/buttocks area. She was able to get up and continue to do her daily activities. The pain got more severe so she decided she better get checked out. Pain was severe, but with medication has come down to a 6/10. She has pain over her right hip and in pubic area. She can't move her right leg due to the pain. She denies any prodromal symptoms, loss of consciousness. Specifically denies light headedness, dizziness, chest pain, palpitations or shortness of breath.    Assessment & Plan    Pubic bone fracture (HCC) with mechanical fall - Orthopedics was consulted by EDP, Dr. Deeann Fare who recommended WBAT, PT and pain control. No surgical intervention at this time -appears to be mechanical fall  -PT has been ordered, [place] [facility].  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 3 times daily  Vitamin D  deficiency -Vitamin D 29.8, increase vitamin D to 2000 units daily   Hypertension - BP stable, currently antihypertensives.   Chronic cough Followed by pulmonology and allergy  Hydrocodone  syrup prn    Ductal carcinoma in situ (DCIS) of left breast S/p lumpectomy in 11/2021 and in clinical remission  Letrozole  12/20/2021 Anastrozole  08/02/2022-stopped due to SE  Follows yearly with Dr. Scherrie Curt     Idiopathic neuropathy -Not on Neurontin    Major depressive disorder, single episode, in full remission (HCC) Continue Remeron  Holding Lexapro  due to hyponatremia     Estimated body mass index is 21.98 kg/m as calculated from the following:   Height as of this encounter: 5' 2 (1.575 m).   Weight as of this encounter: 54.5 kg.  Code Status:  DNR DVT Prophylaxis:  Place TED hose Start: 10/09/23 1145 enoxaparin (LOVENOX) injection 40 mg Start: 10/08/23 1800   Level of Care: Level of care: Telemetry Family Communication: Updated patient Disposition Plan:      Remains inpatient appropriate: DC to SNF if  sodium improving   Procedures:    Consultants:   Orthopedics  Antimicrobials:   Anti-infectives (From admission, onward)    None          Medications  acetaminophen   1,000 mg Oral TID   docusate sodium  100 mg Oral BID   enoxaparin (LOVENOX) injection  40 mg Subcutaneous Q24H   escitalopram   5 mg Oral Daily   folic acid   500 mcg Oral QHS   lidocaine   1 patch Transdermal Q24H   methocarbamol  500 mg Oral TID   mirtazapine   7.5 mg Oral QHS   polyethylene glycol  17 g Oral Daily   sodium chloride   1 g Oral TID WC      Subjective:   Tammy Lindsey was seen and examined today.  No acute complaints today.  Pain controlled.  No chest pain, shortness of breath, abdominal pain nausea vomiting.  Objective:   Vitals:   10/10/23 1010 10/10/23 1642 10/10/23 2041 10/11/23 0529  BP: 128/71 (!) 149/62 (!) 109/55 (!) 159/84  Pulse:  70 65 75  Resp:  18 16 18   Temp:   98.2 F (36.8 C) 98.3 F (36.8 C) 98.1 F (36.7 C)  TempSrc:  Oral    SpO2:  100% 98% 100%  Weight:      Height:        Intake/Output Summary (Last 24 hours) at 10/11/2023 1121 Last data filed at 10/10/2023 1835 Gross per 24 hour  Intake 240 ml  Output --  Net 240 ml     Wt Readings from Last 3 Encounters:  10/08/23 54.5 kg  09/19/23 52.3 kg  09/05/23 51.3 kg     Exam General: Alert and oriented x 3, NAD Cardiovascular: S1 S2 auscultated,  RRR Respiratory: Clear to auscultation bilaterally, no wheezing, rales  Gastrointestinal: Soft, nontender, nondistended, + bowel sounds Ext: no pedal edema bilaterally Neuro: Strength 5/5 upper and lower extremities bilaterally Psych: Normal affect     Data Reviewed:  I have personally reviewed following labs    CBC Lab Results  Component Value Date   WBC 7.0 10/10/2023   RBC 3.92 10/10/2023   HGB 11.1 (L) 10/10/2023   HCT 33.2 (L) 10/10/2023   MCV 84.7 10/10/2023   MCH 28.3 10/10/2023   PLT 160 10/10/2023   MCHC 33.4 10/10/2023   RDW 13.3 10/10/2023   LYMPHSABS 0.8 10/08/2023   MONOABS 0.9 10/08/2023   EOSABS 0.0 10/08/2023   BASOSABS 0.0 10/08/2023     Last metabolic panel Lab Results  Component Value Date   NA 127 (L) 10/11/2023   K 4.6 10/11/2023   CL 98 10/11/2023   CO2 24 10/11/2023   BUN 13 10/11/2023   CREATININE 0.45 10/11/2023   GLUCOSE 96 10/11/2023   GFRNONAA >60 10/11/2023   GFRAA  07/24/2010    >60        The eGFR has been calculated using the MDRD equation. This calculation has not been validated in all clinical situations. eGFR's persistently <60 mL/min signify possible Chronic Kidney Disease.   CALCIUM 8.2 (L) 10/11/2023   PROT 6.4 06/08/2022   ALBUMIN 4.2 09/26/2023   LABGLOB 2.2 06/08/2022   BILITOT 0.5 11/24/2021   ALKPHOS 66 09/26/2023   AST 19 09/26/2023   ALT 13 09/26/2023   ANIONGAP 5 10/11/2023    CBG (last 3)  No results for input(s): GLUCAP in the last 72 hours.     Coagulation Profile: Recent Labs  Lab 10/08/23 1149  INR 1.1  Radiology Studies: I have personally reviewed the imaging studies  No results found.     Bertram Brocks M.D. [facility] 10/11/2023, 11:21 AM  Available via Epic secure chat 7am-7pm After 7 pm, please refer to night coverage provider listed on amion.

## 2023-10-11 NOTE — Progress Notes (Signed)
 Physical Therapy Treatment Patient Details Name: Tammy Lindsey MRN: [id number] DOB: [birth year] Today's Date: 10/11/2023   History of Present Illness Pt is 89 yo female admitted on 10/08/23 after fall with R hip pain.  Pt found to have pubic bone fracture (see imaging for detail). Ortho recommended conservative measures with WBAT.  Pt with hx including neuropathy, breast cancer, depression, varicose veins, hypertension    PT Comments  Pt agreeable to working with therapy. Tolerated session fairly well. Still with some pain with activity. Pt responded I don't remember a few times during session. Will continue to follow. Plan is for SNF    If plan is discharge home, recommend the following: A lot of help with walking and/or transfers;A lot of help with bathing/dressing/bathroom   Can travel by private vehicle     No  Equipment Recommendations  Rolling walker (2 wheels)    Recommendations for Other Services       Precautions / Restrictions Precautions Precautions: Fall Restrictions Weight Bearing Restrictions Per Provider Order: No RLE Weight Bearing Per Provider Order: Weight bearing as tolerated LLE Weight Bearing Per Provider Order: Weight bearing as tolerated     Mobility  Bed Mobility Overal bed mobility: Needs Assistance Bed Mobility: Sit to Supine       Sit to supine: Min assist   General bed mobility comments: Assist for LEs onto bed.    Transfers Overall transfer level: Needs assistance Equipment used: Rolling walker (2 wheels) Transfers: Sit to/from Stand Sit to Stand: Min assist           General transfer comment: Assist to rise, steady, control descent. Cues for safety, technqiue, hand placement    Ambulation/Gait Ambulation/Gait assistance: Min assist Gait Distance (Feet): 20 Feet Assistive device: Rolling walker (2 wheels) Gait Pattern/deviations: Antalgic, Decreased stride length       General Gait Details: Assist to stabilize pt throughout  distance. Slow gait speed. Tolerated distance well. Cues for safety, proper use of RW. Fall risk.    Stairs             Wheelchair Mobility     Tilt Bed    Modified Rankin (Stroke Patients Only)       Balance Overall balance assessment: Needs assistance         Standing balance support: Bilateral upper extremity supported, During functional activity, Reliant on assistive device for balance Standing balance-Leahy Scale: Poor                              Communication Communication Communication: Impaired Factors Affecting Communication: Hearing impaired  Cognition Arousal: Alert Behavior During Therapy: WFL for tasks assessed/performed   PT - Cognitive impairments: Memory                       PT - Cognition Comments: several times pt responded I dont remember Following commands: Intact      Cueing Cueing Techniques: Verbal cues  Exercises      General Comments        Pertinent Vitals/Pain Pain Assessment Pain Assessment: Faces Faces Pain Scale: Hurts a little bit Pain Location: R LE with activity Pain Descriptors / Indicators: Discomfort Pain Intervention(s): Limited activity within patient's tolerance, Monitored during session, Repositioned    Home Living                          Prior Function  PT Goals (current goals can now be found in the care plan section) Progress towards PT goals: Progressing toward goals    Frequency    Min 2X/week      PT Plan      Co-evaluation              AM-PAC PT 6 Clicks Mobility   Outcome Measure  Help needed turning from your back to your side while in a flat bed without using bedrails?: A Little Help needed moving from lying on your back to sitting on the side of a flat bed without using bedrails?: A Little Help needed moving to and from a bed to a chair (including a wheelchair)?: A Little Help needed standing up from a chair using your arms  (e.g., wheelchair or bedside chair)?: A Little Help needed to walk in hospital room?: A Little Help needed climbing 3-5 steps with a railing? : Total 6 Click Score: 16    End of Session Equipment Utilized During Treatment: Gait belt Activity Tolerance: Patient tolerated treatment well Patient left: in bed;with call bell/phone within reach;with bed alarm set   PT Visit Diagnosis: Other abnormalities of gait and mobility (R26.89);Muscle weakness (generalized) (M62.81)     Time: 1610-9604 PT Time Calculation (min) (ACUTE ONLY): 10 min  Charges:    $Gait Training: 8-22 mins PT General Charges $$ ACUTE PT VISIT: 1 Visit                         Tanda Falter, PT Acute Rehabilitation  Office: [phone]

## 2023-10-11 NOTE — Plan of Care (Signed)

## 2023-10-12 DIAGNOSIS — R053 Chronic cough: Secondary | ICD-10-CM | POA: Diagnosis not present

## 2023-10-12 DIAGNOSIS — S32810A Multiple fractures of pelvis with stable disruption of pelvic ring, initial encounter for closed fracture: Secondary | ICD-10-CM | POA: Diagnosis not present

## 2023-10-12 DIAGNOSIS — I10 Essential (primary) hypertension: Secondary | ICD-10-CM | POA: Diagnosis not present

## 2023-10-12 DIAGNOSIS — G609 Hereditary and idiopathic neuropathy, unspecified: Secondary | ICD-10-CM

## 2023-10-12 DIAGNOSIS — E871 Hypo-osmolality and hyponatremia: Secondary | ICD-10-CM | POA: Diagnosis not present

## 2023-10-12 DIAGNOSIS — F325 Major depressive disorder, single episode, in full remission: Secondary | ICD-10-CM

## 2023-10-12 LAB — RENAL FUNCTION PANEL
Albumin: 3.1 g/dL — ABNORMAL LOW (ref 3.5–5.0)
Anion gap: 10 (ref 5–15)
BUN: 11 mg/dL (ref 8–23)
CO2: 23 mmol/L (ref 22–32)
Calcium: 8.4 mg/dL — ABNORMAL LOW (ref 8.9–10.3)
Chloride: 98 mmol/L (ref 98–111)
Creatinine, Ser: 0.42 mg/dL — ABNORMAL LOW (ref 0.44–1.00)
GFR, Estimated: >60 mL/min (ref >60)
Glucose, Bld: 94 mg/dL (ref 70–99)
Phosphorus: 3.6 mg/dL (ref 2.5–4.6)
Potassium: 3.8 mmol/L (ref 3.5–5.1)
Sodium: 131 mmol/L — ABNORMAL LOW (ref 135–145)

## 2023-10-12 NOTE — Discharge Summary (Signed)
 Physician Discharge Summary   Patient: Tammy Lindsey MRN: [id number] DOB: [birth year]  Admit date:     10/08/2023  Discharge date: 10/12/23  Discharge Physician: Bertram Brocks, MD    PCP: Marguerite Shiley, MD   Recommendations at discharge:   Continue salt tabs 1 g p.o. 3 times daily for 1 week Follow metabolic panel on Monday, 10/16/2023 Resume atenolol  if BP is better   Discharge Diagnoses:    Pubic bone fracture (HCC) Acute hyponatremia, likely SIADH   Hypertension   Chronic cough   Ductal carcinoma in situ (DCIS) of left breast   Leg cramps   Major depressive disorder, single episode, in full remission [facility])   Idiopathic neuropathy     Hospital Course:  Patient is a 89 y.o. female with medical history significant of neuropathy, breast cancer, depression, varicose veins, hypertension, chronic cough who presented to ED with right hip pain after a fall. She states on Saturday she was walking in her bedroom and reached for the bed post and must have missed because the next thing she knows she was on the ground. Fell hard onto her right hip/buttocks area. She was able to get up and continue to do her daily activities. The pain got more severe so she decided she better get checked out. Pain was severe, but with medication has come down to a 6/10. She has pain over her right hip and in pubic area. She can't move her right leg due to the pain. She denies any prodromal symptoms, loss of consciousness. Specifically denies light headedness, dizziness, chest pain, palpitations or shortness of breath.    Assessment and Plan:  Pubic bone fracture (HCC) with mechanical fall - Orthopedics was consulted by EDP, Dr. Deeann Fare who recommended WBAT, PT and pain control. No surgical intervention at this time -appears to be mechanical fall  -PT has been ordered, she is [place] [facility].  PT recommended SNF placement - Continue pain control, Robaxin, bowel regimen   Acute hyponatremia likely  due to SIADH - poor water intake and had an episode of diarrhea daily recently (just x1) - Could be SIADH, patient was placed on fluid restriction, hold ARB - Urine osmolality 593, urine sodium 54, serum osmolality 279, TSH 0.9  - Placed on salt tabs 1 g p.o. 3 times daily for 7 days, recheck metabolic panel on Monday, 10/16/2023    Vitamin D deficiency -Vitamin D 29.8, increase vitamin D to 2000 units daily   Hypertension - BP stable, currently antihypertensives held, can resume atenolol  outpatient if BP improving.   Chronic cough Followed by pulmonology and allergy/immunology.   Ductal carcinoma in situ (DCIS) of left breast S/p lumpectomy in 11/2021 and in clinical remission  Letrozole  12/20/2021 Anastrozole  08/02/2022-stopped due to SE  Follows yearly with Dr. Scherrie Curt     Idiopathic neuropathy -Not on Neurontin    Major depressive disorder, single episode, in full remission (HCC) Continue Remeron , Lexapro      Estimated body mass index is 21.98 kg/m as calculated from the following:   Height as of this encounter: 5' 2 (1.575 m).   Weight as of this encounter: 54.5 kg.       Pain control - [place]  Controlled Substance Reporting System database was reviewed. and patient was instructed, not to drive, operate heavy machinery, perform activities at heights, swimming or participation in water activities or provide baby-sitting services while on Pain, Sleep and Anxiety Medications; until their outpatient Physician has advised to do so again. Also  recommended to not to take more than prescribed Pain, Sleep and Anxiety Medications.  Consultants: Orthopedics Procedures performed: None Disposition: Skilled nursing facility Diet recommendation:  Discharge Diet Orders (From admission, onward)     Start     Ordered   10/11/23 0000  Diet general        10/11/23 1112           DISCHARGE MEDICATION: Allergies as of 10/12/2023       Reactions   Arimidex  [anastrozole ]  Other (See Comments)   Caused excessive sweating   Sulfa Antibiotics Other (See Comments)   The patient said she was told when she was 17 she was allergic to this.        Medication List     PAUSE taking these medications    anastrozole  1 MG tablet Wait to take this until your doctor or other care provider tells you to start again. Commonly known as: ARIMIDEX  Take 1 tablet (1 mg total) by mouth daily.   atenolol  25 MG tablet Wait to take this until your doctor or other care provider tells you to start again. Commonly known as: TENORMIN  Take 1 tablet (25 mg total) by mouth at bedtime.       STOP taking these medications    HYDROcodone  bit-homatropine 5-1.5 MG/5ML syrup Commonly known as: HYCODAN   olmesartan  20 MG tablet Commonly known as: BENICAR        TAKE these medications    acetaminophen  500 MG tablet Commonly known as: TYLENOL  Take 500 mg by mouth every 8 (eight) hours as needed for mild pain (pain score 1-3) (or headaches).   amLODipine  5 MG tablet Commonly known as: NORVASC  Take 0.5 tablets (2.5 mg total) by mouth daily with breakfast. What changed: how much to take   B-12 1000 MCG Tabs Take 1,000 mcg by mouth in the morning.   docusate sodium 100 MG capsule Commonly known as: COLACE Take 1 capsule (100 mg total) by mouth 2 (two) times daily.   escitalopram  5 MG tablet Commonly known as: Lexapro  Take 1 tablet (5 mg total) by mouth daily.   folic acid  800 MCG tablet Commonly known as: FOLVITE  Take 400 mcg by mouth at bedtime.   hydrALAZINE  10 MG tablet Commonly known as: APRESOLINE  Take 1 tablet (10 mg total) by mouth as needed (for blood pressure 150 or above).   lidocaine  5 % Commonly known as: LIDODERM  Place 1 patch onto the skin daily. Remove & Discard patch within 12 hours or as directed by MD. Apply to right hip.   methocarbamol 500 MG tablet Commonly known as: ROBAXIN Take 1 tablet (500 mg total) by mouth 3 (three) times daily.    mirtazapine  15 MG tablet Commonly known as: REMERON  Take 0.5 tablets (7.5 mg total) by mouth at bedtime.   ondansetron  4 MG tablet Commonly known as: ZOFRAN  Take 1 tablet (4 mg total) by mouth every 6 (six) hours as needed for nausea.   oxyCODONE  5 MG immediate release tablet Commonly known as: Oxy IR/ROXICODONE  Take 1 tablet (5 mg total) by mouth every 4 (four) hours as needed for severe pain (pain score 7-10).   pantoprazole  40 MG tablet Commonly known as: Protonix  Take 1 tablet (40 mg total) by mouth daily. What changed:  when to take this reasons to take this   polyethylene glycol 17 g packet Commonly known as: MIRALAX / GLYCOLAX Take 17 g by mouth daily.   sodium chloride  1 g tablet Take 1 tablet (1 g  total) by mouth 3 (three) times daily with meals for 7 days.   Systane Complete PF 0.6 % Soln Generic drug: Propylene Glycol (PF) Place 1 drop into both eyes 3 (three) times daily as needed (for dryness).   Vitamin D3 1000 units Caps Take 2,000 Units by mouth in the morning. What changed: how much to take   Voltaren 1 % Gel Generic drug: diclofenac Sodium Apply 2-4 g topically 4 (four) times daily as needed (for pain).        Follow-up Information     Marguerite Shiley, MD. Schedule an appointment as soon as possible for a visit in 2 week(s).   Specialty: Internal Medicine Why: for hospital follow-up, obtain labs on Friday 10/13/23 or Monday 6/23. Contact information: [address] [place] [address] [phone]                Discharge Exam: Cleavon Curls Weights   10/08/23 1032 10/08/23 1531  Weight: 52.2 kg 54.5 kg   S: No acute complaints, wants to take a bath otherwise stable, pain controlled.  No nausea vomiting, abdominal pain, chest pain or shortness of breath.  BP 139/69 (BP Location: Left Arm)   Pulse 69   Temp 97.7 F (36.5 C) (Oral)   Resp 16   Ht 5' 2 (1.575 m)   Wt 54.5 kg   SpO2 98%   BMI 21.98 kg/m   Physical  Exam General: Alert and oriented x 3, NAD Cardiovascular: S1 S2 clear, RRR.  Respiratory: CTAB, no wheezing, rales Gastrointestinal: Soft, nontender, nondistended, NBS Ext: no pedal edema bilaterally Neuro: no new deficits Skin: No rashes Psych: Normal affect    Condition at discharge: fair  The results of significant diagnostics from this hospitalization (including imaging, microbiology, ancillary and laboratory) are listed below for reference.   Imaging Studies: DG Hip Unilat W or Wo Pelvis 2-3 Views Right Result Date: 10/08/2023 CLINICAL DATA:  Fall last night.  Right hip pain. EXAM: DG HIP (WITH OR WITHOUT PELVIS) 2-3V RIGHT COMPARISON:  KUB 10/25/2021 FINDINGS: There is diffuse decreased bone mineralization. There is acute fracture of the junction of the pubic body and the superior right pubic ramus with approximately 7 mm inferior displacement of the pubic body with respect to the superior pubic ramus. Additional nondisplaced fracture line lucency overlying the right inferior pubic ramus with minimal 1 mm superior cortical step-off. Moderate to severe pubic symphysis joint space narrowing, subchondral sclerosis, mild peripheral osteophytosis. Mild right femoroacetabular joint space narrowing and peripheral osteophytosis. Calcifications overlying the left hemipelvis compatible with vascular phleboliths versus fibroid calcifications, unchanged. IMPRESSION: 1. Acute fracture of the junction of the pubic body and the superior right pubic ramus with approximately 7 mm inferior displacement of the pubic body with respect to the superior pubic ramus. 2. Additional nondisplaced acute fracture of the right inferior pubic ramus. 3. Mild right femoroacetabular osteoarthritis. Electronically Signed   By: Bertina Broccoli M.D.   On: 10/08/2023 11:04    Microbiology: Results for orders placed or performed in visit on 03/11/19  Novel Coronavirus, NAA ([facility])     Status: None   Collection Time: 03/11/19  12:00 AM   Specimen: Nasopharyngeal(NP) swabs in vial transport medium   NASOPHARYNGE  TESTING  Result Value Ref Range Status   SARS-CoV-2, NAA Not Detected Not Detected Final    Comment: This nucleic acid amplification test was developed and its performance characteristics determined by [employer]. Nucleic acid amplification tests include PCR and TMA. This test  has not been FDA cleared or approved. This test has been authorized by FDA under an Emergency Use Authorization (EUA). This test is only authorized for the duration of time the declaration that circumstances exist justifying the authorization of the emergency use of in vitro diagnostic tests for detection of SARS-CoV-2 virus and/or diagnosis of COVID-19 infection under section 564(b)(1) of the Act, 21 U.S.C. 409WJX-9(J) (1), unless the authorization is terminated or revoked sooner. When diagnostic testing is negative, the possibility of a false negative result should be considered in the context of a patient's recent exposures and the presence of clinical signs and symptoms consistent with COVID-19. An individual without symptoms of COVID-19 and who is not shedding SARS-CoV-2 virus would  expect to have a negative (not detected) result in this assay.     Labs: CBC: Recent Labs  Lab 10/08/23 1149 10/10/23 0602  WBC 10.1 7.0  NEUTROABS 8.4*  --   HGB 12.8 11.1*  HCT 36.4 33.2*  MCV 79.6* 84.7  PLT 239 160   Basic Metabolic Panel: Recent Labs  Lab 10/08/23 1149 10/08/23 1646 10/09/23 0509 10/10/23 0602 10/11/23 0523 10/12/23 0522  NA 125*  --  125* 128* 127* 131*  K 4.4  --  4.0 3.8 4.6 3.8  CL 90*  --  96* 99 98 98  CO2 21*  --  23 19* 24 23  GLUCOSE 99  --  95 95 96 94  BUN 9  --  12 16 13 11   CREATININE 0.60  --  0.53 0.57 0.45 0.42*  CALCIUM 9.3  --  8.0* 7.9* 8.2* 8.4*  MG  --  1.9  --   --   --   --   PHOS  --   --   --   --   --  3.6   Liver Function Tests: Recent Labs  Lab  10/12/23 0522  ALBUMIN 3.1*   CBG: No results for input(s): GLUCAP in the last 168 hours.  Discharge time spent: greater than 30 minutes.  Signed: Bertram Brocks, MD [facility] 10/12/2023

## 2023-10-12 NOTE — TOC Transition Note (Signed)
 Transition of Care [facility]) - Discharge Note   Patient Details  Name: Tammy Lindsey MRN: [id number] Date of Birth: 15-Jun-1931  [facility]) CM/SW Contact:  Marty Sleet, LCSW Phone Number: 10/12/2023, 10:59 AM   Clinical Narrative:    Pt to discharge to [facility]. Pt will be going to room 156. RN to call report to [place] at [phone]. DC packet with signed DNR and script placed at RN station. Spoke with pt's on and confirmed discharge plans. PTAR called at 10:52am.    Final next level of care: Skilled Nursing Facility Barriers to Discharge: No Barriers Identified   Patient Goals and CMS Choice Patient states their goals for this hospitalization and ongoing recovery are:: To return to [facility] CMS Medicare.gov Compare Post Acute Care list provided to:: Patient Choice offered to / list presented to : Patient [place] ownership interest in [facility].provided to:: Patient    Discharge Placement PASRR number recieved: 10/09/23            Patient chooses bed at: [facility] Patient to be transferred to facility by: PT[facility] Name of family member notified: Son, Chip Patient and family notified of of transfer: 10/12/23  Discharge Plan and Services Additional resources added to the After Visit Summary for   In-house Referral: Clinical Social Work Discharge Planning Services: NA Post Acute Care Choice: Skilled Nursing Facility          DME Arranged: N/A DME Agency: NA                  Social Drivers of Health (SDOH) Interventions SDOH Screenings   Food Insecurity: No Food Insecurity (10/08/2023)  Housing: Low Risk  (10/08/2023)  Transportation Needs: No Transportation Needs (10/08/2023)  Utilities: Not At Risk (10/08/2023)  Alcohol Screen: Low Risk  (11/14/2022)  Depression (PHQ2-9): Low Risk  (07/24/2023)  Financial Resource Strain: Low Risk  (12/21/2021)  Social Connections: Unknown (10/08/2023)  Tobacco Use: Low Risk  (10/08/2023)      Readmission Risk Interventions    10/12/2023   10:58 AM  Readmission Risk Prevention Plan  Transportation Screening Complete  PCP or Specialist Appt within 5-7 Days Complete  Home Care Screening Complete  Medication Review (RN CM) Complete

## 2023-10-12 NOTE — Progress Notes (Signed)
 Called and spoke with Valinda Gault RN at [facility]. Report given to [facility].

## 2023-10-13 ENCOUNTER — Telehealth: Payer: Self-pay

## 2023-10-13 DIAGNOSIS — S32591S Other specified fracture of right pubis, sequela: Secondary | ICD-10-CM | POA: Diagnosis not present

## 2023-10-13 DIAGNOSIS — Z9181 History of falling: Secondary | ICD-10-CM | POA: Diagnosis not present

## 2023-10-13 DIAGNOSIS — R296 Repeated falls: Secondary | ICD-10-CM | POA: Diagnosis not present

## 2023-10-13 DIAGNOSIS — R2689 Other abnormalities of gait and mobility: Secondary | ICD-10-CM | POA: Diagnosis not present

## 2023-10-13 DIAGNOSIS — R531 Weakness: Secondary | ICD-10-CM | POA: Diagnosis not present

## 2023-10-13 DIAGNOSIS — M25551 Pain in right hip: Secondary | ICD-10-CM | POA: Diagnosis not present

## 2023-10-13 NOTE — Telephone Encounter (Signed)
 Copied from CRM [id number]. Topic: General - Other >> Oct 13, 2023  1:55 PM Shamecia H wrote: Reason for CRM: Sonu from [employer] called and they are needing the diagnostic code, medication for nausea secondary to cancer treatment, medication for post surgery nausea before induction of anesthesia,  fax number is [phone]. Could you please give them a call at [phone] option 2.

## 2023-10-15 DIAGNOSIS — R531 Weakness: Secondary | ICD-10-CM | POA: Diagnosis not present

## 2023-10-15 DIAGNOSIS — R296 Repeated falls: Secondary | ICD-10-CM | POA: Diagnosis not present

## 2023-10-16 ENCOUNTER — Non-Acute Institutional Stay (SKILLED_NURSING_FACILITY): Payer: Self-pay | Admitting: Internal Medicine

## 2023-10-16 ENCOUNTER — Encounter: Payer: Self-pay | Admitting: Internal Medicine

## 2023-10-16 DIAGNOSIS — R2681 Unsteadiness on feet: Secondary | ICD-10-CM | POA: Diagnosis not present

## 2023-10-16 DIAGNOSIS — I10 Essential (primary) hypertension: Secondary | ICD-10-CM

## 2023-10-16 DIAGNOSIS — E871 Hypo-osmolality and hyponatremia: Secondary | ICD-10-CM | POA: Diagnosis not present

## 2023-10-16 DIAGNOSIS — F329 Major depressive disorder, single episode, unspecified: Secondary | ICD-10-CM

## 2023-10-16 DIAGNOSIS — R531 Weakness: Secondary | ICD-10-CM | POA: Diagnosis not present

## 2023-10-16 DIAGNOSIS — Z9181 History of falling: Secondary | ICD-10-CM | POA: Diagnosis not present

## 2023-10-16 DIAGNOSIS — S32591S Other specified fracture of right pubis, sequela: Secondary | ICD-10-CM | POA: Diagnosis not present

## 2023-10-16 DIAGNOSIS — F5101 Primary insomnia: Secondary | ICD-10-CM

## 2023-10-16 DIAGNOSIS — M25551 Pain in right hip: Secondary | ICD-10-CM | POA: Diagnosis not present

## 2023-10-16 DIAGNOSIS — R2689 Other abnormalities of gait and mobility: Secondary | ICD-10-CM | POA: Diagnosis not present

## 2023-10-16 DIAGNOSIS — R296 Repeated falls: Secondary | ICD-10-CM | POA: Diagnosis not present

## 2023-10-16 LAB — COMPREHENSIVE METABOLIC PANEL WITH GFR
Calcium: 8.5 — AB (ref 8.7–10.7)
eGFR: 89

## 2023-10-16 LAB — BASIC METABOLIC PANEL WITH GFR
BUN: 15 (ref 4–21)
CO2: 25 — AB (ref 13–22)
Chloride: 98 — AB (ref 99–108)
Creatinine: 0.5 (ref 0.5–1.1)
Glucose: 94
Potassium: 4.8 meq/L (ref 3.5–5.1)
Sodium: 133 — AB (ref 137–147)

## 2023-10-16 NOTE — Progress Notes (Signed)
 Provider:   Location:  [facility] Room Number: 156 A Place of Service:  SNF (31)  PCP: Charlanne Fredia CROME, MD Patient Care Team: Charlanne Fredia CROME, MD as PCP - General (Internal Medicine) Lonni Slain, MD as PCP - Cardiology (Cardiology) Cary Doffing, MD as Attending Physician (Dermatology) Rosalva Sawyer, MD as Consulting Physician (Obstetrics and Gynecology) Leslee Reusing, MD as Consulting Physician (Ophthalmology) Glean Stephane BROCKS, RN (Inactive) as Oncology Nurse Navigator Tyree Nanetta SAILOR, RN as Oncology Nurse Navigator Curvin Deward MOULD, MD as Consulting Physician (General Surgery) Lanny Callander, MD as Consulting Physician (Hematology) Shannon Agent, MD as Consulting Physician (Radiation Oncology) Hunsucker, Donnice SAUNDERS, MD as Consulting Physician (Pulmonary Disease)  Extended Emergency Contact Information Primary Emergency Contact: Fincher,William Chip  [place] Home Phone: [phone] Work Phone: [phone] Mobile Phone: [phone] Relation: Son Secondary Emergency Contact: Ryan,Liz Address: [address] [place], [place] [address] [place] Phone: [phone] Relation: Daughter  Code Status: DNR Goals of Care: Advanced Directive information    10/16/2023   10:49 AM  Advanced Directives  Does Patient Have a Medical Advance Directive? Yes  Type of Advance Directive Living will;Out of facility DNR (pink MOST or yellow form)      Chief Complaint  Patient presents with   New Admit To SNF    HPI: Patient is a 89 y.o. 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   Diverticulosis    Esophageal reflux    Family history of breast cancer 11/25/2021   Hiatal hernia    Hx of colonic polyps    Hypertension    Per new patient packet   Mild asthma    Per new patient packet   Osteoarthritis    Osteopenia    bone density 1-09 and 2-13   Pulmonary nodules    scattered-CT Scan July 2010, 12-10, and 04-2010-all stable felt benign    Past Surgical History:  Procedure Laterality Date   BREAST LUMPECTOMY WITH RADIOACTIVE SEED LOCALIZATION Left 12/07/2021   Procedure: LEFT BREAST LUMPECTOMY WITH RADIOACTIVE SEED LOCALIZATION;  Surgeon: Curvin Deward MOULD, MD;  Location: [facility];  Service: General;  Laterality: Left;   CHOLECYSTECTOMY  04/25/1972   Gall Bladder; [facility]   DG PORTABLE CHEST X RAY ([facility] HX)  2023   Shoulder   DIAGNOSTIC MAMMOGRAM  2022   [facility]; Per new patient packet   JOINT REPLACEMENT Left 08/24/2010   JOINT REPLACEMENT Left 04/25/2010   Reverse    orthroscopic  Rotator Cuff Left 04/25/2009   Partial shoulder Left 04/26/2003   ROTATOR CUFF REPAIR     x 2 1998 and 1999   SPINE SURGERY  04/26/1983   disk   TONSILLECTOMY  04/25/1950   Dr. Jerome; Per new patient packet    reports  that she has never smoked. She has never used smokeless tobacco. She reports that she does not currently use alcohol. She reports that she does not use drugs. Social History   Socioeconomic History   Marital status: Widowed    Spouse name: Not on file   Number of children: 2   Years of education: Not on file   Highest education level: Not on file  Occupational History   Occupation: Retired  Tobacco Use   Smoking status: Never   Smokeless tobacco: Never  Vaping Use   Vaping status: Never Used  Substance  and Sexual Activity   Alcohol use: Not Currently   Drug use: No   Sexual activity: Not Currently    Birth control/protection: Post-menopausal  Other Topics Concern   Not on file  Social History Narrative   Diet: Left blank      Caffeine: Yes      Married, if yes what year: Widow; married in 1957       Do you live in a house, apartment, assisted living, condo, trailer, ect: [facility]       Is it one or more stories: Amedeo       How many persons live in your home? One      Pets: Cat      Highest level or education completed: Left blank      Current/Past profession: Artist       Exercise: Yes                 Type and how often: Walk         Living Will: Yes   DNR: Yes    POA/HPOA: Left blank       Functional Status:   Do you have difficulty bathing or dressing yourself? Left blank    Do you have difficulty preparing food or eating? Left blank    Do you have difficulty managing your medications? Left blank    Do you have difficulty managing your finances? Left blank    Do you have difficulty affording your medications? Left blank    Social Drivers of Health   Financial Resource Strain: Low Risk  (12/21/2021)   Overall Financial Resource Strain (CARDIA)    Difficulty of Paying Living Expenses: Not hard at all  Food Insecurity: No Food Insecurity (10/08/2023)   Hunger Vital Sign    Worried About Running Out of Food in the Last Year: Never true    Ran Out of Food in the Last Year: Never true  Transportation Needs: No Transportation Needs (10/08/2023)   PRAPARE - Administrator, Civil Service (Medical): No    Lack of Transportation (Non-Medical): No  Physical Activity: Not on file  Stress: Not on file  Social Connections: Unknown (10/08/2023)   Social Connection and Isolation Panel    Frequency of Communication with Friends and Family: Three times a week    Frequency of Social Gatherings with Friends and Family: Three times a week    Attends  Religious Services: Patient declined    Active Member of Clubs or Organizations: Patient declined    Attends Banker Meetings: Patient declined    Marital Status: Widowed  Intimate Partner Violence: Not At Risk (10/08/2023)   Humiliation, Afraid, Rape, and Kick questionnaire    Fear of Current or Ex-Partner: No    Emotionally Abused: No    Physically Abused: No    Sexually Abused: No    Functional Status Survey:  Family History  Problem Relation Age of Onset   Cancer Mother        cervical   Alzheimer's disease Mother    Asthma Father    Other Father        Cerebral hemorrhage   Heart disease Sister        Heart disease before age 69   Hypertension Sister    Stroke Maternal Grandfather    Heart attack Maternal Grandfather    Breast cancer Daughter 45   Asthma Son    Cancer Cousin        dx early 53s; unknown type; paternal female cousin   Neuropathy Neg Hx     Health Maintenance  Topic Date Due   COVID-19 Vaccine (7 - 2024-25 season) 09/19/2023   Medicare Annual Wellness (AWV)  11/14/2023   INFLUENZA VACCINE  11/24/2023   DTaP/Tdap/Td (2 - Td or Tdap) 06/13/2032   Pneumococcal Vaccine: 50+ Years  Completed   DEXA SCAN  Completed   Zoster Vaccines- Shingrix  Completed   HPV VACCINES  Aged Out   Meningococcal B Vaccine  Aged Out    Allergies  Allergen Reactions   Arimidex  [Anastrozole ] Other (See Comments)    Caused excessive sweating   Sulfa Antibiotics Other (See Comments)    The patient said she was told when she was 17 she was allergic to this.    Outpatient Encounter Medications as of 10/16/2023  Medication Sig   acetaminophen  (TYLENOL ) 500 MG tablet Take 500 mg by mouth every 8 (eight) hours as needed for mild pain (pain score 1-3) (or headaches).   amLODipine  (NORVASC ) 5 MG tablet Take 0.5 tablets (2.5 mg total) by mouth daily with breakfast.   Cholecalciferol  (VITAMIN D3) 1000 units CAPS Take 2,000 Units by mouth in the morning.    Cyanocobalamin  (B-12) 1000 MCG TABS Take 1,000 mcg by mouth in the morning.   diclofenac Sodium (VOLTAREN) 1 % GEL Apply 2-4 g topically 4 (four) times daily as needed (for pain).   docusate sodium  (COLACE) 100 MG capsule Take 1 capsule (100 mg total) by mouth 2 (two) times daily.   escitalopram  (LEXAPRO ) 5 MG tablet Take 1 tablet (5 mg total) by mouth daily.   folic acid  (FOLVITE ) 800 MCG tablet Take 400 mcg by mouth at bedtime.   hydrALAZINE  (APRESOLINE ) 10 MG tablet Take 1 tablet (10 mg total) by mouth as needed (for blood pressure 150 or above).   lidocaine  (LIDODERM ) 5 % Place 1 patch onto the skin daily. Remove & Discard patch within 12 hours or as directed by MD. Apply to right hip. (Patient taking differently: Place 1 patch onto the skin 2 (two) times daily. Remove & Discard patch within 12 hours or as directed by MD. Apply to right hip.)   methocarbamol  (ROBAXIN ) 500 MG tablet Take 1 tablet (500 mg total) by mouth 3 (three) times daily.   mirtazapine  (REMERON ) 15 MG tablet Take 0.5 tablets (7.5 mg total) by mouth at bedtime.   ondansetron  (ZOFRAN ) 4 MG tablet Take 1 tablet (4 mg total) by mouth every 6 (six) hours as needed for nausea.   oxyCODONE  (OXY IR/ROXICODONE ) 5 MG immediate release tablet Take 1 tablet (5 mg total) by mouth every 4 (four) hours as needed for severe pain (pain score 7-10).   oxycodone  (OXY-IR) 5 MG capsule Take 10 mg by mouth every 4 (four) hours as needed.   pantoprazole  (PROTONIX ) 40 MG tablet Take 1 tablet (40 mg total) by mouth daily.  polyethylene glycol (MIRALAX  / GLYCOLAX ) 17 g packet Take 17 g by mouth daily.   sodium chloride  1 g tablet Take 1 tablet (1 g total) by mouth 3 (three) times daily with meals for 7 days.   SYSTANE COMPLETE PF 0.6 % SOLN Place 1 drop into both eyes 3 (three) times daily as needed (for dryness).   [Paused] anastrozole  (ARIMIDEX ) 1 MG tablet Take 1 tablet (1 mg total) by mouth daily. (Patient not taking: Reported on 10/16/2023)    [Paused] atenolol  (TENORMIN ) 25 MG tablet Take 1 tablet (25 mg total) by mouth at bedtime. (Patient not taking: Reported on 10/16/2023)   No facility-administered encounter medications on file as of 10/16/2023.    Review of Systems  Constitutional:  Negative for activity change and appetite change.  HENT: Negative.    Respiratory:  Negative for cough and shortness of breath.   Cardiovascular:  Negative for leg swelling.  Gastrointestinal:  Negative for constipation.  Genitourinary: Negative.   Musculoskeletal:  Positive for gait problem. Negative for arthralgias and myalgias.  Skin: Negative.   Neurological:  Negative for dizziness and weakness.  Psychiatric/Behavioral:  Positive for dysphoric mood. Negative for confusion and sleep disturbance.     Vitals:   10/16/23 1030 10/16/23 1031  BP: (!) 120/57 124/70  Pulse: 93   Resp: 15   Temp: 97.9 F (36.6 C)   SpO2: 95%    There is no height or weight on file to calculate BMI. Physical Exam Vitals reviewed.  Constitutional:      Appearance: Normal appearance.  HENT:     Head: Normocephalic.     Nose: Nose normal.     Mouth/Throat:     Mouth: Mucous membranes are moist.     Pharynx: Oropharynx is clear.   Eyes:     Pupils: Pupils are equal, round, and reactive to light.    Cardiovascular:     Rate and Rhythm: Normal rate and regular rhythm.     Pulses: Normal pulses.     Heart sounds: Normal heart sounds. No murmur heard. Pulmonary:     Effort: Pulmonary effort is normal.     Breath sounds: Normal breath sounds.  Abdominal:     General: Abdomen is flat. Bowel sounds are normal.     Palpations: Abdomen is soft.   Musculoskeletal:        General: No swelling.     Cervical back: Neck supple.   Skin:    General: Skin is warm.   Neurological:     General: No focal deficit present.     Mental Status: She is alert and oriented to person, place, and time.   Psychiatric:        Mood and Affect: Mood normal.         Thought Content: Thought content normal.     Labs reviewed: Basic Metabolic Panel: Recent Labs    10/08/23 1646 10/09/23 0509 10/10/23 0602 10/11/23 0523 10/12/23 0522  NA  --    < > 128* 127* 131*  K  --    < > 3.8 4.6 3.8  CL  --    < > 99 98 98  CO2  --    < > 19* 24 23  GLUCOSE  --    < > 95 96 94  BUN  --    < > 16 13 11   CREATININE  --    < > 0.57 0.45 0.42*  CALCIUM  --    < >  7.9* 8.2* 8.4*  MG 1.9  --   --   --   --   PHOS  --   --   --   --  3.6   < > = values in this interval not displayed.   Liver Function Tests: Recent Labs    04/13/23 0000 09/26/23 0000 09/26/23 1020 10/12/23 0522  AST 19 19  --   --   ALT 12 13  --   --   ALKPHOS 55 66  --   --   ALBUMIN 4.2  --  4.2 3.1*   No results for input(s): LIPASE, AMYLASE in the last 8760 hours. No results for input(s): AMMONIA in the last 8760 hours. CBC: Recent Labs    09/26/23 0000 10/08/23 1149 10/10/23 0602  WBC 6.7 10.1 7.0  NEUTROABS  --  8.4*  --   HGB 12.5 12.8 11.1*  HCT 38 36.4 33.2*  MCV  --  79.6* 84.7  PLT 306 239 160   Cardiac Enzymes: Recent Labs    10/08/23 1646  CKTOTAL 89   BNP: Invalid input(s): POCBNP Lab Results  Component Value Date   HGBA1C 5.5 06/08/2022   Lab Results  Component Value Date   TSH 0.947 10/11/2023   Lab Results  Component Value Date   VITAMINB12 1,872 (H) 10/08/2023   Lab Results  Component Value Date   FOLATE 11.2 06/08/2022   Lab Results  Component Value Date   FERRITIN 281 10/08/2023    Imaging and Procedures obtained prior to SNF admission: DG Hip Unilat W or Wo Pelvis 2-3 Views Right Result Date: 10/08/2023 CLINICAL DATA:  Fall last night.  Right hip pain. EXAM: DG HIP (WITH OR WITHOUT PELVIS) 2-3V RIGHT COMPARISON:  KUB 10/25/2021 FINDINGS: There is diffuse decreased bone mineralization. There is acute fracture of the junction of the pubic body and the superior right pubic ramus with approximately 7 mm inferior displacement  of the pubic body with respect to the superior pubic ramus. Additional nondisplaced fracture line lucency overlying the right inferior pubic ramus with minimal 1 mm superior cortical step-off. Moderate to severe pubic symphysis joint space narrowing, subchondral sclerosis, mild peripheral osteophytosis. Mild right femoroacetabular joint space narrowing and peripheral osteophytosis. Calcifications overlying the left hemipelvis compatible with vascular phleboliths versus fibroid calcifications, unchanged. IMPRESSION: 1. Acute fracture of the junction of the pubic body and the superior right pubic ramus with approximately 7 mm inferior displacement of the pubic body with respect to the superior pubic ramus. 2. Additional nondisplaced acute fracture of the right inferior pubic ramus. 3. Mild right femoroacetabular osteoarthritis. Electronically Signed   By: Tanda Lyons M.D.   On: 10/08/2023 11:04    Assessment/Plan 1. Closed fracture of ramus of right pubis, sequela (Primary) WBAT Pain controlled Working with therapy Would be able to go home by next weak 2. Hyponatremia Will recheck BMP If persists will take her off LExapro   3. Essential hypertension On norvasc , and Atenlol  4. Reactive depression Low dos eof Lexapro   5. Primary insomnia Remeron   6. Unstable gait Working with therapy    Family/ staff Communication:   Labs/tests ordered:

## 2023-10-16 NOTE — Telephone Encounter (Signed)
 Received fax from [facility] stating that Prior Auth for Ondansetron  was DENIED.  Fax routed to Dr. Charlanne

## 2023-10-17 ENCOUNTER — Encounter: Admitting: Internal Medicine

## 2023-10-17 DIAGNOSIS — R2689 Other abnormalities of gait and mobility: Secondary | ICD-10-CM | POA: Diagnosis not present

## 2023-10-17 DIAGNOSIS — S32591S Other specified fracture of right pubis, sequela: Secondary | ICD-10-CM | POA: Diagnosis not present

## 2023-10-17 DIAGNOSIS — Z9181 History of falling: Secondary | ICD-10-CM | POA: Diagnosis not present

## 2023-10-17 DIAGNOSIS — M25551 Pain in right hip: Secondary | ICD-10-CM | POA: Diagnosis not present

## 2023-10-17 DIAGNOSIS — R531 Weakness: Secondary | ICD-10-CM | POA: Diagnosis not present

## 2023-10-17 DIAGNOSIS — R296 Repeated falls: Secondary | ICD-10-CM | POA: Diagnosis not present

## 2023-10-18 DIAGNOSIS — R296 Repeated falls: Secondary | ICD-10-CM | POA: Diagnosis not present

## 2023-10-18 DIAGNOSIS — R2689 Other abnormalities of gait and mobility: Secondary | ICD-10-CM | POA: Diagnosis not present

## 2023-10-18 DIAGNOSIS — S32591S Other specified fracture of right pubis, sequela: Secondary | ICD-10-CM | POA: Diagnosis not present

## 2023-10-18 DIAGNOSIS — M25551 Pain in right hip: Secondary | ICD-10-CM | POA: Diagnosis not present

## 2023-10-18 DIAGNOSIS — Z9181 History of falling: Secondary | ICD-10-CM | POA: Diagnosis not present

## 2023-10-18 DIAGNOSIS — R531 Weakness: Secondary | ICD-10-CM | POA: Diagnosis not present

## 2023-10-19 ENCOUNTER — Other Ambulatory Visit: Payer: Self-pay | Admitting: Orthopedic Surgery

## 2023-10-19 ENCOUNTER — Telehealth: Admitting: Orthopedic Surgery

## 2023-10-19 DIAGNOSIS — R296 Repeated falls: Secondary | ICD-10-CM | POA: Diagnosis not present

## 2023-10-19 DIAGNOSIS — R2689 Other abnormalities of gait and mobility: Secondary | ICD-10-CM | POA: Diagnosis not present

## 2023-10-19 DIAGNOSIS — R531 Weakness: Secondary | ICD-10-CM | POA: Diagnosis not present

## 2023-10-19 DIAGNOSIS — Z9181 History of falling: Secondary | ICD-10-CM | POA: Diagnosis not present

## 2023-10-19 DIAGNOSIS — S32591S Other specified fracture of right pubis, sequela: Secondary | ICD-10-CM | POA: Diagnosis not present

## 2023-10-19 DIAGNOSIS — M25551 Pain in right hip: Secondary | ICD-10-CM | POA: Diagnosis not present

## 2023-10-19 MED ORDER — ATENOLOL 25 MG PO TABS: 25.0000 mg | ORAL_TABLET | Freq: Every day | ORAL | Status: DC

## 2023-10-19 MED ORDER — OXYCODONE HCL 5 MG PO TABS: 5.0000 mg | ORAL_TABLET | ORAL | 0 refills | Status: DC | PRN

## 2023-10-19 NOTE — Telephone Encounter (Signed)
 SBP> 140 per chart review. 03/13 atenolol  held by cardiology due to low blood pressure. Advised to restart if blood pressure are elevated again. Plan to resume atenolol  25 mg po at bedtime. [facility] to report any low blood pressures or dizziness to PCP.

## 2023-10-20 ENCOUNTER — Emergency Department (HOSPITAL_COMMUNITY)
Admission: EM | Admit: 2023-10-20 | Discharge: 2023-10-21 | Disposition: A | Discharge status: Skilled Nursing Facility | Attending: Student in an Organized Health Care Education/Training Program | Admitting: Student in an Organized Health Care Education/Training Program

## 2023-10-20 ENCOUNTER — Encounter (HOSPITAL_COMMUNITY): Payer: Self-pay

## 2023-10-20 ENCOUNTER — Emergency Department (HOSPITAL_COMMUNITY)

## 2023-10-20 DIAGNOSIS — I10 Essential (primary) hypertension: Secondary | ICD-10-CM | POA: Diagnosis not present

## 2023-10-20 DIAGNOSIS — Z9181 History of falling: Secondary | ICD-10-CM | POA: Diagnosis not present

## 2023-10-20 DIAGNOSIS — R296 Repeated falls: Secondary | ICD-10-CM | POA: Diagnosis not present

## 2023-10-20 DIAGNOSIS — R509 Fever, unspecified: Secondary | ICD-10-CM | POA: Diagnosis not present

## 2023-10-20 DIAGNOSIS — R609 Edema, unspecified: Secondary | ICD-10-CM | POA: Diagnosis not present

## 2023-10-20 DIAGNOSIS — R531 Weakness: Secondary | ICD-10-CM | POA: Diagnosis not present

## 2023-10-20 DIAGNOSIS — M79671 Pain in right foot: Secondary | ICD-10-CM | POA: Diagnosis not present

## 2023-10-20 DIAGNOSIS — W19XXXA Unspecified fall, initial encounter: Secondary | ICD-10-CM | POA: Diagnosis not present

## 2023-10-20 DIAGNOSIS — M799 Soft tissue disorder, unspecified: Secondary | ICD-10-CM | POA: Diagnosis not present

## 2023-10-20 DIAGNOSIS — S99921A Unspecified injury of right foot, initial encounter: Secondary | ICD-10-CM | POA: Diagnosis not present

## 2023-10-20 DIAGNOSIS — S32591S Other specified fracture of right pubis, sequela: Secondary | ICD-10-CM | POA: Diagnosis not present

## 2023-10-20 DIAGNOSIS — M7731 Calcaneal spur, right foot: Secondary | ICD-10-CM | POA: Diagnosis not present

## 2023-10-20 DIAGNOSIS — M25551 Pain in right hip: Secondary | ICD-10-CM | POA: Diagnosis not present

## 2023-10-20 DIAGNOSIS — M79604 Pain in right leg: Secondary | ICD-10-CM | POA: Diagnosis not present

## 2023-10-20 DIAGNOSIS — R2689 Other abnormalities of gait and mobility: Secondary | ICD-10-CM | POA: Diagnosis not present

## 2023-10-20 DIAGNOSIS — W228XXA Striking against or struck by other objects, initial encounter: Secondary | ICD-10-CM | POA: Diagnosis not present

## 2023-10-20 DIAGNOSIS — M7989 Other specified soft tissue disorders: Secondary | ICD-10-CM | POA: Diagnosis not present

## 2023-10-20 NOTE — ED Triage Notes (Signed)
 Pt to ED via [facility] from [facility], there for rehab c/o right foot pain and swelling. No known injury to right foot, PMS intact  Pt does have confirmed pelvic fracture why she is at rehab from a fall 2 weeks ago.   No medications given by EMS.   Pt orientation at baseline.

## 2023-10-20 NOTE — Progress Notes (Signed)
 Orthopedic Tech Progress Note Patient Details:  Tammy Lindsey 07-01-1931 [id number]  Ortho Devices Type of Ortho Device: Postop shoe/boot Ortho Device/Splint Location: RLE Ortho Device/Splint Interventions: Ordered, Application   Post Interventions Patient Tolerated: Well Instructions Provided: Care of device   Knoah Nedeau L Tiburcio Linder 10/20/2023, 10:29 PM

## 2023-10-20 NOTE — ED Notes (Signed)
 Please update daughter, number under emergency contacts

## 2023-10-20 NOTE — ED Notes (Signed)
[facility] called, no eta given  

## 2023-10-20 NOTE — ED Notes (Signed)
Ortho tech consulted

## 2023-10-20 NOTE — Discharge Instructions (Signed)
 Your x-ray showed soft tissue swelling and a stress reaction from the injury you sustained to your foot.  We recommend you avoid bearing weight until you can be seen for a repeat x-ray by your primary care physician.

## 2023-10-20 NOTE — ED Provider Notes (Signed)
 [place] [facility] AT [facility] Provider Note   CSN: [id number] Arrival date & time: 10/20/23  2005     Patient presents with: Leg Pain (right)   Tammy Lindsey is a 89 y.o. female.  {Add pertinent medical, surgical, social history, OB history to HPI:32947}  Leg Pain      Prior to Admission medications   Medication Sig Start Date End Date Taking? Authorizing Provider  acetaminophen  (TYLENOL ) 500 MG tablet Take 500 mg by mouth every 8 (eight) hours as needed for mild pain (pain score 1-3) (or headaches).    [provider]  amLODipine  (NORVASC ) 5 MG tablet Take 0.5 tablets (2.5 mg total) by mouth daily with breakfast. 10/11/23   Rai, Ripudeep K, MD  anastrozole  (ARIMIDEX ) 1 MG tablet Take 1 tablet (1 mg total) by mouth daily. Patient not taking: Reported on 10/16/2023 05/08/23   Cloretta Arley NOVAK, MD  atenolol  (TENORMIN ) 25 MG tablet Take 1 tablet (25 mg total) by mouth at bedtime. 10/19/23   Fargo, Delania Ferg E, NP  Cholecalciferol  (VITAMIN D3) 1000 units CAPS Take 2,000 Units by mouth in the morning. 10/11/23   Rai, Nydia POUR, MD  Cyanocobalamin  (B-12) 1000 MCG TABS Take 1,000 mcg by mouth in the morning.    [provider]  diclofenac Sodium (VOLTAREN) 1 % GEL Apply 2-4 g topically 4 (four) times daily as needed (for pain).    [provider]  docusate sodium  (COLACE) 100 MG capsule Take 1 capsule (100 mg total) by mouth 2 (two) times daily. 10/11/23   Rai, Nydia POUR, MD  escitalopram  (LEXAPRO ) 5 MG tablet Take 1 tablet (5 mg total) by mouth daily. 09/19/23   Charlanne Fredia CROME, MD  folic acid  (FOLVITE ) 800 MCG tablet Take 400 mcg by mouth at bedtime.    [provider]  hydrALAZINE  (APRESOLINE ) 10 MG tablet Take 1 tablet (10 mg total) by mouth as needed (for blood pressure 150 or above). 08/11/23   Charlanne Fredia CROME, MD  lidocaine  (LIDODERM ) 5 % Place 1 patch onto the skin daily. Remove & Discard patch within 12 hours or as directed by MD.  Apply to right hip. Patient taking differently: Place 1 patch onto the skin 2 (two) times daily. Remove & Discard patch within 12 hours or as directed by MD. Apply to right hip. 10/11/23   Rai, Nydia POUR, MD  methocarbamol  (ROBAXIN ) 500 MG tablet Take 1 tablet (500 mg total) by mouth 3 (three) times daily. 10/11/23   Rai, Nydia POUR, MD  mirtazapine  (REMERON ) 15 MG tablet Take 0.5 tablets (7.5 mg total) by mouth at bedtime. 09/05/23   Charlanne Fredia CROME, MD  ondansetron  (ZOFRAN ) 4 MG tablet Take 1 tablet (4 mg total) by mouth every 6 (six) hours as needed for nausea. 10/11/23   Rai, Nydia POUR, MD  oxyCODONE  (OXY IR/ROXICODONE ) 5 MG immediate release tablet Take 1 tablet (5 mg total) by mouth every 4 (four) hours as needed for severe pain (pain score 7-10). 10/19/23   Fargo, Brazen Domangue E, NP  oxycodone  (OXY-IR) 5 MG capsule Take 10 mg by mouth every 4 (four) hours as needed.    [provider]  pantoprazole  (PROTONIX ) 40 MG tablet Take 1 tablet (40 mg total) by mouth daily. 07/04/23   Gupta, Anjali L, MD  polyethylene glycol (MIRALAX  / GLYCOLAX ) 17 g packet Take 17 g by mouth daily. 10/12/23   Rai, Nydia POUR, MD  SYSTANE COMPLETE PF 0.6 % SOLN Place 1 drop  into both eyes 3 (three) times daily as needed (for dryness).    [provider]    Allergies: Arimidex  [anastrozole ] and Sulfa antibiotics    Review of Systems  Updated Vital Signs BP (!) 176/81 (BP Location: Left Arm)   Pulse 96   Temp 97.6 F (36.4 C) (Oral)   Resp 16   Ht 5' 2 (1.575 m)   Wt 54.4 kg   SpO2 99%   BMI 21.95 kg/m   Physical Exam  (all labs ordered are listed, but only abnormal results are displayed) Labs Reviewed - No data to display  EKG: None  Radiology: DG Foot Complete Right Result Date: 10/20/2023 CLINICAL DATA:  Right foot pain and swelling.  No known injury. EXAM: RIGHT FOOT COMPLETE - 3+ VIEW COMPARISON:  None Available. FINDINGS: There is no evidence of fracture or dislocation. Cortical  thickening of the second through fourth metatarsal shafts. No erosions. Plantar calcaneal spur. Soft tissue thickening about the dorsum of the foot. No soft tissue gas. No radiopaque foreign body. IMPRESSION: 1. Soft tissue thickening about the dorsum of the foot. No soft tissue gas or radiopaque foreign body. 2. Cortical thickening of the second through fourth metatarsal shafts, can be seen with stress reaction. Electronically Signed   By: Andrea Gasman M.D.   On: 10/20/2023 21:04    {Document cardiac monitor, telemetry assessment procedure when appropriate:32947} Procedures   Medications Ordered in the ED - No data to display    {Click here for ABCD2, HEART and other calculators REFRESH Note before signing:1}                              Medical Decision Making Amount and/or Complexity of Data Reviewed Radiology: ordered.   ***  {Document critical care time when appropriate  Document review of labs and clinical decision tools ie CHADS2VASC2, etc  Document your independent review of radiology images and any outside records  Document your discussion with family members, caretakers and with consultants  Document social determinants of health affecting pt's care  Document your decision making why or why not admission, treatments were needed:32947:::1}   Final diagnoses:  None    ED Discharge Orders     None

## 2023-10-20 NOTE — ED Notes (Signed)
 This RN asked EDP Amy to see pt.

## 2023-10-23 ENCOUNTER — Non-Acute Institutional Stay (SKILLED_NURSING_FACILITY): Payer: Self-pay | Admitting: Internal Medicine

## 2023-10-23 ENCOUNTER — Encounter: Payer: Self-pay | Admitting: Internal Medicine

## 2023-10-23 ENCOUNTER — Other Ambulatory Visit: Payer: Self-pay | Admitting: Internal Medicine

## 2023-10-23 DIAGNOSIS — E612 Magnesium deficiency: Secondary | ICD-10-CM | POA: Diagnosis not present

## 2023-10-23 DIAGNOSIS — S32591S Other specified fracture of right pubis, sequela: Secondary | ICD-10-CM

## 2023-10-23 DIAGNOSIS — R296 Repeated falls: Secondary | ICD-10-CM | POA: Diagnosis not present

## 2023-10-23 DIAGNOSIS — M7989 Other specified soft tissue disorders: Secondary | ICD-10-CM

## 2023-10-23 DIAGNOSIS — R252 Cramp and spasm: Secondary | ICD-10-CM | POA: Diagnosis not present

## 2023-10-23 DIAGNOSIS — I10 Essential (primary) hypertension: Secondary | ICD-10-CM

## 2023-10-23 DIAGNOSIS — M25551 Pain in right hip: Secondary | ICD-10-CM | POA: Diagnosis not present

## 2023-10-23 DIAGNOSIS — E871 Hypo-osmolality and hyponatremia: Secondary | ICD-10-CM | POA: Diagnosis not present

## 2023-10-23 DIAGNOSIS — R2689 Other abnormalities of gait and mobility: Secondary | ICD-10-CM | POA: Diagnosis not present

## 2023-10-23 DIAGNOSIS — R531 Weakness: Secondary | ICD-10-CM | POA: Diagnosis not present

## 2023-10-23 DIAGNOSIS — Z9181 History of falling: Secondary | ICD-10-CM | POA: Diagnosis not present

## 2023-10-23 MED ORDER — OXYCODONE HCL 5 MG PO TABS: 5.0000 mg | ORAL_TABLET | Freq: Four times a day (QID) | ORAL | 0 refills | Status: DC

## 2023-10-23 MED ORDER — METHOCARBAMOL 500 MG PO TABS: 500.0000 mg | ORAL_TABLET | Freq: Four times a day (QID) | ORAL | Status: DC

## 2023-10-23 NOTE — Progress Notes (Addendum)
 Provider:   Location:  [facility] Room Number: 156 A Place of Service:  SNF (31)  PCP: Charlanne Fredia CROME, MD Patient Care Team: Charlanne Fredia CROME, MD as PCP - General (Internal Medicine) Lonni Slain, MD as PCP - Cardiology (Cardiology) Cary Doffing, MD as Attending Physician (Dermatology) Rosalva Sawyer, MD as Consulting Physician (Obstetrics and Gynecology) Leslee Reusing, MD as Consulting Physician (Ophthalmology) Glean Stephane BROCKS, RN (Inactive) as Oncology Nurse Navigator Tyree Nanetta SAILOR, RN as Oncology Nurse Navigator Curvin Deward MOULD, MD as Consulting Physician (General Surgery) Lanny Callander, MD as Consulting Physician (Hematology) Shannon Agent, MD as Consulting Physician (Radiation Oncology) Hunsucker, Donnice SAUNDERS, MD as Consulting Physician (Pulmonary Disease)  Extended Emergency Contact Information Primary Emergency Contact: Godek,William Chip  [place] Home Phone: [phone] Work Phone: [phone] Mobile Phone: [phone] Relation: Son Secondary Emergency Contact: Ryan,Liz Address: [address] [place], [place] [address] [place] Phone: [phone] Relation: Daughter  Code Status: DNR Goals of Care: Advanced Directive information    10/16/2023   10:49 AM  Advanced Directives  Does Patient Have a Medical Advance Directive? Yes  Type of Advance Directive Living will;Out of facility DNR (pink MOST or yellow form)      Chief Complaint  Patient presents with   RE Admit To SNF    HPI: Patient is a 89 y.o. female seen today for admission to Rehab    Patient was admitted to Hospital from 6/15-6/19 for Right Pubic Ramus fracture after a fall and Hyponatremia   [place] in   Has h/o HTN, Depression, Insomnia Breast cancer  She was send to ED  few days ago as due to Pain in her both legs  And some swelling in her Right Leg There was some concern for DVT But ED  notes she just had pain and Swelling in her Right foot Xray was non Conclusive  Showed thickened soft tissue at the dorum and cortical thickening that could be from stress reaction  And was discharged with the boot  Patient here continues to have severe Pain in her thigh area in both her legs Her family in the room very concerned Want her pain to be taken care of They are not worried about the Right Foot for now Patient is in Obvious pain in her Both Legs like cramps No Recent Injury    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   Diverticulosis    Esophageal reflux    Family history of breast cancer 11/25/2021   Hiatal hernia    Hx of colonic polyps    Hypertension    Per new patient packet   Mild asthma    Per new patient packet   Osteoarthritis    Osteopenia    bone density 1-09 and 2-13   Pulmonary nodules    scattered-CT Scan July 2010, 12-10, and 04-2010-all stable felt benign    Past Surgical History:  Procedure Laterality Date   BREAST LUMPECTOMY WITH RADIOACTIVE SEED LOCALIZATION Left 12/07/2021   Procedure: LEFT BREAST LUMPECTOMY WITH RADIOACTIVE SEED LOCALIZATION;  Surgeon: Curvin Deward MOULD, MD;  Location: [facility];  Service: General;  Laterality: Left;   CHOLECYSTECTOMY  04/25/1972   Gall Bladder; [facility]   DG PORTABLE CHEST X RAY ([facility] HX)  2023   Shoulder   DIAGNOSTIC MAMMOGRAM  2022  [facility]; Per new patient packet   JOINT REPLACEMENT Left 08/24/2010   JOINT REPLACEMENT Left 04/25/2010   Reverse    orthroscopic  Rotator Cuff Left 04/25/2009   Partial shoulder Left 04/26/2003   ROTATOR CUFF REPAIR     x 2 1998 and 1999   SPINE SURGERY  04/26/1983   disk   TONSILLECTOMY  04/25/1950   Dr. Jerome; Per new patient packet    reports that she has never smoked. She has never used smokeless tobacco. She reports that she does not currently use alcohol. She reports that she does not use  drugs. Social History   Socioeconomic History   Marital status: Widowed    Spouse name: Not on file   Number of children: 2   Years of education: Not on file   Highest education level: Not on file  Occupational History   Occupation: Retired  Tobacco Use   Smoking status: Never   Smokeless tobacco: Never  Vaping Use   Vaping status: Never Used  Substance and Sexual Activity   Alcohol use: Not Currently   Drug use: No   Sexual activity: Not Currently    Birth control/protection: Post-menopausal  Other Topics Concern   Not on file  Social History Narrative   Diet: Left blank      Caffeine: Yes      Married, if yes what year: Widow; married in 1957       Do you live in a house, apartment, assisted living, condo, trailer, ect: [facility]       Is it one or more stories: Amedeo       How many persons live in your home? One      Pets: Cat      Highest level or education completed: Left blank      Current/Past profession: Artist       Exercise: Yes                 Type and how often: Walk         Living Will: Yes   DNR: Yes    POA/HPOA: Left blank       Functional Status:   Do you have difficulty bathing or dressing yourself? Left blank    Do you have difficulty preparing food or eating? Left blank    Do you have difficulty managing your medications? Left blank    Do you have difficulty managing your finances? Left blank    Do you have difficulty affording your medications? Left blank    Social Drivers of Health   Financial Resource Strain: Low Risk  (12/21/2021)   Overall Financial Resource Strain (CARDIA)    Difficulty of Paying Living Expenses: Not hard at all  Food Insecurity: No Food Insecurity (10/08/2023)   Hunger Vital Sign    Worried About Running Out of Food in the Last Year: Never true    Ran Out of Food in the Last Year: Never true  Transportation Needs: No Transportation Needs (10/08/2023)   PRAPARE - Scientist, research (physical sciences) (Medical): No    Lack of Transportation (Non-Medical): No  Physical Activity: Not on file  Stress: Not on file  Social Connections: Unknown (10/08/2023)   Social Connection and Isolation Panel    Frequency of Communication with Friends and Family: Three times a week    Frequency of Social Gatherings with Friends and Family: Three times a week    Attends Religious Services: Patient declined  Active Member of Clubs or Organizations: Patient declined    Attends Banker Meetings: Patient declined    Marital Status: Widowed  Intimate Partner Violence: Not At Risk (10/08/2023)   Humiliation, Afraid, Rape, and Kick questionnaire    Fear of Current or Ex-Partner: No    Emotionally Abused: No    Physically Abused: No    Sexually Abused: No    Functional Status Survey:    Family History  Problem Relation Age of Onset   Cancer Mother        cervical   Alzheimer's disease Mother    Asthma Father    Other Father        Cerebral hemorrhage   Heart disease Sister        Heart disease before age 83   Hypertension Sister    Stroke Maternal Grandfather    Heart attack Maternal Grandfather    Breast cancer Daughter 91   Asthma Son    Cancer Cousin        dx early 74s; unknown type; paternal female cousin   Neuropathy Neg Hx     Health Maintenance  Topic Date Due   COVID-19 Vaccine (7 - 2024-25 season) 09/19/2023   Medicare Annual Wellness (AWV)  11/14/2023   INFLUENZA VACCINE  11/24/2023   DTaP/Tdap/Td (2 - Td or Tdap) 06/13/2032   Pneumococcal Vaccine: 50+ Years  Completed   DEXA SCAN  Completed   Zoster Vaccines- Shingrix  Completed   Hepatitis B Vaccines  Aged Out   HPV VACCINES  Aged Out   Meningococcal B Vaccine  Aged Out    Allergies  Allergen Reactions   Arimidex  [Anastrozole ] Other (See Comments)    Caused excessive sweating   Sulfa Antibiotics Other (See Comments)    The patient said she was told when she was 17 she was allergic to this.     Outpatient Encounter Medications as of 10/23/2023  Medication Sig   acetaminophen  (TYLENOL ) 500 MG tablet Take 500 mg by mouth every 8 (eight) hours as needed for mild pain (pain score 1-3) (or headaches).   amLODipine  (NORVASC ) 2.5 MG tablet Take 2.5 mg by mouth daily.   atenolol  (TENORMIN ) 25 MG tablet Take 25 mg by mouth daily.   Cholecalciferol  (VITAMIN D3) 1000 units CAPS Take 2,000 Units by mouth in the morning.   Cyanocobalamin  (B-12) 1000 MCG TABS Take 1,000 mcg by mouth in the morning.   diclofenac Sodium (VOLTAREN) 1 % GEL Apply 2-4 g topically 4 (four) times daily as needed (for pain).   docusate sodium  (COLACE) 100 MG capsule Take 1 capsule (100 mg total) by mouth 2 (two) times daily.   escitalopram  (LEXAPRO ) 5 MG tablet Take 1 tablet (5 mg total) by mouth daily.   folic acid  (FOLVITE ) 800 MCG tablet Take 400 mcg by mouth at bedtime.   hydrALAZINE  (APRESOLINE ) 10 MG tablet Take 1 tablet (10 mg total) by mouth as needed (for blood pressure 150 or above).   lidocaine  4 % Place 1 patch onto the skin in the morning and at bedtime.   methocarbamol  (ROBAXIN ) 500 MG tablet Take 1 tablet (500 mg total) by mouth 3 (three) times daily.   mirtazapine  (REMERON ) 7.5 MG tablet Take 7.5 mg by mouth at bedtime.   ondansetron  (ZOFRAN -ODT) 4 MG disintegrating tablet Take 4 mg by mouth every 6 (six) hours as needed for nausea or vomiting.   oxyCODONE  (OXY IR/ROXICODONE ) 5 MG immediate release tablet Take 1 tablet (5 mg total)  by mouth every 4 (four) hours as needed for severe pain (pain score 7-10). (Patient taking differently: Take 10 mg by mouth every 4 (four) hours as needed for severe pain (pain score 7-10).)   pantoprazole  (PROTONIX ) 40 MG tablet Take 1 tablet (40 mg total) by mouth daily.   polyethylene glycol (MIRALAX  / GLYCOLAX ) 17 g packet Take 17 g by mouth daily.   SYSTANE COMPLETE PF 0.6 % SOLN Place 1 drop into both eyes 3 (three) times daily as needed (for dryness).   amLODipine   (NORVASC ) 5 MG tablet Take 0.5 tablets (2.5 mg total) by mouth daily with breakfast. (Patient not taking: Reported on 10/23/2023)   lidocaine  (LIDODERM ) 5 % Place 1 patch onto the skin daily. Remove & Discard patch within 12 hours or as directed by MD. Apply to right hip. (Patient not taking: Reported on 10/23/2023)   No facility-administered encounter medications on file as of 10/23/2023.    Review of Systems  Constitutional:  Positive for activity change. Negative for appetite change.  HENT: Negative.    Respiratory:  Negative for cough and shortness of breath.   Cardiovascular:  Negative for leg swelling.  Gastrointestinal:  Negative for constipation.  Genitourinary: Negative.   Musculoskeletal:  Positive for arthralgias, gait problem and myalgias.  Skin: Negative.   Neurological:  Negative for dizziness and weakness.  Psychiatric/Behavioral:  Negative for confusion, dysphoric mood and sleep disturbance.     Vitals:   10/23/23 1104  BP: 135/74  Pulse: 77  Resp: (!) 21  Temp: (!) 97.5 F (36.4 C)  SpO2: 95%  Weight: 114 lb 14.4 oz (52.1 kg)  Height: 5' 2 (1.575 m)   Body mass index is 21.02 kg/m. Physical Exam Vitals reviewed.  Constitutional:      Appearance: Normal appearance.  HENT:     Head: Normocephalic.     Nose: Nose normal.     Mouth/Throat:     Mouth: Mucous membranes are moist.     Pharynx: Oropharynx is clear.   Eyes:     Pupils: Pupils are equal, round, and reactive to light.    Cardiovascular:     Rate and Rhythm: Normal rate and regular rhythm.     Pulses: Normal pulses.     Heart sounds: Normal heart sounds. No murmur heard. Pulmonary:     Effort: Pulmonary effort is normal.     Breath sounds: Normal breath sounds.  Abdominal:     General: Abdomen is flat. Bowel sounds are normal.     Palpations: Abdomen is soft.   Musculoskeletal:        General: No swelling.     Cervical back: Neck supple.     Comments: Mild Swelling in her Right Foot    Skin:    General: Skin is warm.   Neurological:     General: No focal deficit present.     Mental Status: She is alert and oriented to person, place, and time.   Psychiatric:        Mood and Affect: Mood normal.        Thought Content: Thought content normal.     Labs reviewed: Basic Metabolic Panel: Recent Labs    10/08/23 1646 10/09/23 0509 10/10/23 0602 10/11/23 0523 10/12/23 0522 10/16/23 0000  NA  --    < > 128* 127* 131* 133*  K  --    < > 3.8 4.6 3.8 4.8  CL  --    < > 99 98 98 98*  CO2  --    < > 19* 24 23 25*  GLUCOSE  --    < > 95 96 94  --   BUN  --    < > 16 13 11 15   CREATININE  --    < > 0.57 0.45 0.42* 0.5  CALCIUM  --    < > 7.9* 8.2* 8.4* 8.5*  MG 1.9  --   --   --   --   --   PHOS  --   --   --   --  3.6  --    < > = values in this interval not displayed.   Liver Function Tests: Recent Labs    04/13/23 0000 09/26/23 0000 09/26/23 1020 10/12/23 0522  AST 19 19  --   --   ALT 12 13  --   --   ALKPHOS 55 66  --   --   ALBUMIN 4.2  --  4.2 3.1*   No results for input(s): LIPASE, AMYLASE in the last 8760 hours. No results for input(s): AMMONIA in the last 8760 hours. CBC: Recent Labs    09/26/23 0000 10/08/23 1149 10/10/23 0602  WBC 6.7 10.1 7.0  NEUTROABS  --  8.4*  --   HGB 12.5 12.8 11.1*  HCT 38 36.4 33.2*  MCV  --  79.6* 84.7  PLT 306 239 160   Cardiac Enzymes: Recent Labs    10/08/23 1646  CKTOTAL 89   BNP: Invalid input(s): POCBNP Lab Results  Component Value Date   HGBA1C 5.5 06/08/2022   Lab Results  Component Value Date   TSH 0.947 10/11/2023   Lab Results  Component Value Date   VITAMINB12 1,872 (H) 10/08/2023   Lab Results  Component Value Date   FOLATE 11.2 06/08/2022   Lab Results  Component Value Date   FERRITIN 281 10/08/2023    Imaging and Procedures obtained prior to SNF admission: DG Foot Complete Right Result Date: 10/20/2023 CLINICAL DATA:  Right foot pain and swelling.  No known  injury. EXAM: RIGHT FOOT COMPLETE - 3+ VIEW COMPARISON:  None Available. FINDINGS: There is no evidence of fracture or dislocation. Cortical thickening of the second through fourth metatarsal shafts. No erosions. Plantar calcaneal spur. Soft tissue thickening about the dorsum of the foot. No soft tissue gas. No radiopaque foreign body. IMPRESSION: 1. Soft tissue thickening about the dorsum of the foot. No soft tissue gas or radiopaque foreign body. 2. Cortical thickening of the second through fourth metatarsal shafts, can be seen with stress reaction. Electronically Signed   By: Andrea Gasman M.D.   On: 10/20/2023 21:04    Assessment/Plan 1. Bilateral leg cramps (Primary) Her pain seems more like Leg Cramps Check BMP,and Magnesium Schedule Oxycodone  5 mg QID alternate with Robaxin  500 mg QID  2. Swelling of right foot Has boot on Right now will wait possible Ortho Referral  3. Hyponatremia Check BMP  4. Closed fracture of ramus of right pubis, sequela Patient was doing well with this and was walking with her walker  5. Essential hypertension Norvasc  and Atenolol  Hydralazine  PRn  Addendum Labs came back Sodium is 129  Magnesium is 1.8 Started on Sodium 1gm for 3 days Mag 400 mg every day for 3 days Discontinue Lexapro   Family/ staff Communication:   Labs/tests ordered:

## 2023-10-23 NOTE — Telephone Encounter (Signed)
 OK

## 2023-10-23 NOTE — Addendum Note (Signed)
 Addended by: Mithcell Schumpert LALA on: 10/23/2023 08:28 PM   Modules accepted: Orders

## 2023-10-24 ENCOUNTER — Other Ambulatory Visit: Payer: Self-pay | Admitting: Orthopedic Surgery

## 2023-10-24 ENCOUNTER — Non-Acute Institutional Stay (SKILLED_NURSING_FACILITY): Payer: Self-pay | Admitting: Internal Medicine

## 2023-10-24 ENCOUNTER — Encounter: Payer: Self-pay | Admitting: Internal Medicine

## 2023-10-24 DIAGNOSIS — E871 Hypo-osmolality and hyponatremia: Secondary | ICD-10-CM

## 2023-10-24 DIAGNOSIS — R296 Repeated falls: Secondary | ICD-10-CM | POA: Diagnosis not present

## 2023-10-24 DIAGNOSIS — R531 Weakness: Secondary | ICD-10-CM | POA: Diagnosis not present

## 2023-10-24 DIAGNOSIS — S32591S Other specified fracture of right pubis, sequela: Secondary | ICD-10-CM | POA: Diagnosis not present

## 2023-10-24 DIAGNOSIS — R252 Cramp and spasm: Secondary | ICD-10-CM | POA: Diagnosis not present

## 2023-10-24 DIAGNOSIS — M7989 Other specified soft tissue disorders: Secondary | ICD-10-CM | POA: Diagnosis not present

## 2023-10-24 MED ORDER — OXYCODONE HCL 5 MG PO TABS: 5.0000 mg | ORAL_TABLET | Freq: Three times a day (TID) | ORAL | 0 refills | Status: DC | PRN

## 2023-10-24 NOTE — Progress Notes (Unsigned)
 Location:  [facility] Room Number: 156 A Place of Service:  SNF [phone]) Provider:  Charlanne Fredia CROME, MD  Patient Care Team: Charlanne Fredia CROME, MD as PCP - General (Internal Medicine) Lonni Slain, MD as PCP - Cardiology (Cardiology) Cary Doffing, MD as Attending Physician (Dermatology) Rosalva Sawyer, MD as Consulting Physician (Obstetrics and Gynecology) Leslee Reusing, MD as Consulting Physician (Ophthalmology) Glean Stephane BROCKS, RN (Inactive) as Oncology Nurse Navigator Tyree Nanetta SAILOR, RN as Oncology Nurse Navigator Curvin Deward MOULD, MD as Consulting Physician (General Surgery) Lanny Callander, MD as Consulting Physician (Hematology) Shannon Agent, MD as Consulting Physician (Radiation Oncology) Hunsucker, Donnice SAUNDERS, MD as Consulting Physician (Pulmonary Disease)  Extended Emergency Contact Information Primary Emergency Contact: Schafer,William Chip  [place] Home Phone: [phone] Work Phone: [phone] Mobile Phone: [phone] Relation: Son Secondary Emergency Contact: Ryan,Liz Address: [address] [place], [place] [address] [place] Phone: [phone] Relation: Daughter  Code Status:  DNR Goals of care: Advanced Directive information    10/16/2023   10:49 AM  Advanced Directives  Does Patient Have a Medical Advance Directive? Yes  Type of Advance Directive Living will;Out of facility DNR (pink MOST or yellow form)     Chief Complaint  Patient presents with   Acute Visit    HPI:  Pt is a 89 y.o. female seen today for an acute visit for Leg Pain  She is in Rehab in [place]  Patient was admitted to Hospital from 6/15-6/19 for Right Pubic Ramus fracture after a fall and Hyponatremia   Lives in [place] in [place]  Has h/o HTN, Depression, Insomnia Breast cancer   She was send to ED  few days ago as due to Pain in her both legs  And some swelling in her Right Leg There was some concern  for DVT But ED notes she just had pain and Swelling in her Right foot Xray was non Conclusive  Showed thickened soft tissue at the dorum and cortical thickening that could be from stress reaction  And was discharged with the boot  Patient here continues to have severe Pain in her thigh area in both her leg  Scheduled Oxycodone  and Robaxin  Sill has breakthrough Pain and Getting Xtra Oxycodone   Seen today Patient staying in bed C/o Pain in her Thigh Area Both e Legs With Cramps     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   Diverticulosis    Esophageal reflux    Family history of breast cancer 11/25/2021   Hiatal hernia    Hx of colonic polyps    Hypertension    Per new patient packet   Mild asthma    Per new patient packet   Osteoarthritis    Osteopenia    bone density 1-09 and 2-13   Pulmonary nodules    scattered-CT Scan July 2010, 12-10, and 04-2010-all stable felt benign    Past Surgical History:  Procedure Laterality Date   BREAST LUMPECTOMY WITH RADIOACTIVE SEED LOCALIZATION Left 12/07/2021   Procedure: LEFT BREAST LUMPECTOMY WITH RADIOACTIVE SEED LOCALIZATION;  Surgeon: Curvin Deward MOULD, MD;  Location: [facility];  Service: General;  Laterality: Left;   CHOLECYSTECTOMY  04/25/1972   Gall Bladder; [facility]   DG PORTABLE CHEST X RAY ([facility] HX)  2023   Shoulder   DIAGNOSTIC MAMMOGRAM  2022  [facility]; Per new patient packet   JOINT REPLACEMENT Left 08/24/2010   JOINT REPLACEMENT Left 04/25/2010   Reverse    orthroscopic  Rotator Cuff Left 04/25/2009   Partial shoulder Left 04/26/2003   ROTATOR CUFF REPAIR     x 2 1998 and 1999   SPINE SURGERY  04/26/1983   disk   TONSILLECTOMY  04/25/1950   Dr. Jerome; Per new patient packet    Allergies  Allergen Reactions   Arimidex  [Anastrozole ] Other (See Comments)    Caused excessive sweating   Sulfa Antibiotics Other (See Comments)    The patient said she  was told when she was 17 she was allergic to this.    Outpatient Encounter Medications as of 10/24/2023  Medication Sig   acetaminophen  (TYLENOL ) 500 MG tablet Take 500 mg by mouth every 8 (eight) hours as needed for mild pain (pain score 1-3) (or headaches).   amLODipine  (NORVASC ) 2.5 MG tablet Take 2.5 mg by mouth daily.   atenolol  (TENORMIN ) 25 MG tablet Take 25 mg by mouth daily.   Cholecalciferol  (VITAMIN D3) 1000 units CAPS Take 2,000 Units by mouth in the morning.   Cyanocobalamin  (B-12) 1000 MCG TABS Take 1,000 mcg by mouth in the morning.   diclofenac Sodium (VOLTAREN) 1 % GEL Apply 2-4 g topically 4 (four) times daily as needed (for pain).   docusate sodium  (COLACE) 100 MG capsule Take 1 capsule (100 mg total) by mouth 2 (two) times daily.   folic acid  (FOLVITE ) 800 MCG tablet Take 400 mcg by mouth at bedtime.   hydrALAZINE  (APRESOLINE ) 10 MG tablet Take 1 tablet (10 mg total) by mouth as needed (for blood pressure 150 or above).   lidocaine  4 % Place 1 patch onto the skin in the morning and at bedtime.   mirtazapine  (REMERON ) 7.5 MG tablet Take 7.5 mg by mouth at bedtime.   ondansetron  (ZOFRAN -ODT) 4 MG disintegrating tablet Take 4 mg by mouth every 6 (six) hours as needed for nausea or vomiting.   oxyCODONE  (OXY IR/ROXICODONE ) 5 MG immediate release tablet Take 1 tablet (5 mg total) by mouth every 8 (eight) hours as needed for severe pain (pain score 7-10).   pantoprazole  (PROTONIX ) 40 MG tablet Take 1 tablet (40 mg total) by mouth daily.   polyethylene glycol (MIRALAX  / GLYCOLAX ) 17 g packet Take 17 g by mouth daily.   SYSTANE COMPLETE PF 0.6 % SOLN Place 1 drop into both eyes 3 (three) times daily as needed (for dryness).   lidocaine  (LIDODERM ) 5 % Place 1 patch onto the skin daily. Remove & Discard patch within 12 hours or as directed by MD. Apply to right hip. (Patient not taking: Reported on 10/24/2023)   methocarbamol  (ROBAXIN ) 500 MG tablet Take 1 tablet (500 mg total) by mouth 4  (four) times daily. (Patient not taking: Reported on 10/24/2023)   No facility-administered encounter medications on file as of 10/24/2023.    Review of Systems  Constitutional:  Positive for activity change. Negative for appetite change.  HENT: Negative.    Respiratory:  Negative for cough and shortness of breath.   Cardiovascular:  Negative for leg swelling.  Gastrointestinal:  Negative for constipation.  Genitourinary: Negative.   Musculoskeletal:  Positive for arthralgias and gait problem. Negative for myalgias.  Skin: Negative.   Neurological:  Positive for weakness. Negative for dizziness.  Psychiatric/Behavioral:  Positive for dysphoric mood. Negative for confusion and sleep disturbance.     Immunization History  Administered Date(s) Administered  Fluad  Trivalent(High Dose 65+) 01/03/2023   Influenza Split 01/10/2011, 12/27/2011, 01/09/2012, 01/26/2015, 12/17/2017, 01/12/2019, 12/19/2019, 12/19/2020   Influenza-Unspecified 12/24/2021   Moderna Covid-19 Vaccine Bivalent Booster 78yrs & up 02/05/2021, 09/27/2021, 01/27/2022   Moderna SARS-COV2 Booster Vaccination 03/10/2020, 11/24/2020, 12/11/2021   Moderna Sars-Covid-2 Vaccination 05/07/2019, 06/04/2019   PNEUMOCOCCAL CONJUGATE-20 04/01/2022   Pfizer(Comirnaty )Fall Seasonal Vaccine 12 years and older 01/03/2023, 07/25/2023   Pneumococcal Polysaccharide-23 08/02/2016, 07/21/2020, 11/10/2020   Respiratory Syncytial Virus Vaccine ,Recomb Aduvanted(Arexvy ) 04/14/2022   Tdap 06/13/2022   Zoster Recombinant(Shingrix) 09/06/2016, 11/22/2016   Pertinent  Health Maintenance Due  Topic Date Due   INFLUENZA VACCINE  11/24/2023   DEXA SCAN  Completed      11/14/2022    3:28 PM 11/15/2022    2:12 PM 04/04/2023    9:21 AM 06/06/2023    3:57 PM 07/24/2023    1:11 PM  Fall Risk  Falls in the past year? 0 0 1 1 1   Was there an injury with Fall? 0 0 1 1 1   Fall Risk Category Calculator 0 0 2 2 2   Patient at Risk for Falls Due to No Fall  Risks No Fall Risks History of fall(s) History of fall(s);Impaired balance/gait No Fall Risks  Fall risk Follow up Falls evaluation completed Falls evaluation completed Falls evaluation completed Falls evaluation completed Falls evaluation completed   Functional Status Survey:    Vitals:   10/23/23 1556 10/23/23 2256  BP: (!) 149/75 (!) 97/57  Pulse:  71  Resp:  16  Temp:  98.1 F (36.7 C)  SpO2:  92%  Weight: 114 lb 14.4 oz (52.1 kg) 114 lb 14.4 oz (52.1 kg)  Height: 5' 2 (1.575 m) 5' 2 (1.575 m)   Body mass index is 21.02 kg/m. Physical Exam Vitals reviewed.  Constitutional:      Appearance: Normal appearance.  HENT:     Head: Normocephalic.     Nose: Nose normal.     Mouth/Throat:     Mouth: Mucous membranes are moist.     Pharynx: Oropharynx is clear.  Eyes:     Pupils: Pupils are equal, round, and reactive to light.  Cardiovascular:     Rate and Rhythm: Normal rate and regular rhythm.     Pulses: Normal pulses.     Heart sounds: Normal heart sounds. No murmur heard. Pulmonary:     Effort: Pulmonary effort is normal.     Breath sounds: Normal breath sounds.  Abdominal:     General: Abdomen is flat. Bowel sounds are normal.     Palpations: Abdomen is soft.  Musculoskeletal:        General: No swelling.     Cervical back: Neck supple.     Comments: C/o Pain in Both Legs all Over her legs tender in both legs  Skin:    General: Skin is warm.  Neurological:     General: No focal deficit present.     Mental Status: She is alert and oriented to person, place, and time.  Psychiatric:        Mood and Affect: Mood normal.        Thought Content: Thought content normal.     Labs reviewed: Recent Labs    10/08/23 1646 10/09/23 0509 10/10/23 0602 10/11/23 0523 10/12/23 0522 10/16/23 0000  NA  --    < > 128* 127* 131* 133*  K  --    < > 3.8 4.6 3.8 4.8  CL  --    < >  99 98 98 98*  CO2  --    < > 19* 24 23 25*  GLUCOSE  --    < > 95 96 94  --   BUN  --     < > 16 13 11 15   CREATININE  --    < > 0.57 0.45 0.42* 0.5  CALCIUM  --    < > 7.9* 8.2* 8.4* 8.5*  MG 1.9  --   --   --   --   --   PHOS  --   --   --   --  3.6  --    < > = values in this interval not displayed.   Recent Labs    04/13/23 0000 09/26/23 0000 09/26/23 1020 10/12/23 0522  AST 19 19  --   --   ALT 12 13  --   --   ALKPHOS 55 66  --   --   ALBUMIN 4.2  --  4.2 3.1*   Recent Labs    09/26/23 0000 10/08/23 1149 10/10/23 0602  WBC 6.7 10.1 7.0  NEUTROABS  --  8.4*  --   HGB 12.5 12.8 11.1*  HCT 38 36.4 33.2*  MCV  --  79.6* 84.7  PLT 306 239 160   Lab Results  Component Value Date   TSH 0.947 10/11/2023   Lab Results  Component Value Date   HGBA1C 5.5 06/08/2022   Lab Results  Component Value Date   CHOL 136 04/13/2023   HDL 57 04/13/2023   LDLCALC 63 04/13/2023   TRIG 83 04/13/2023    Significant Diagnostic Results in last 30 days:  DG Foot Complete Right Result Date: 10/20/2023 CLINICAL DATA:  Right foot pain and swelling.  No known injury. EXAM: RIGHT FOOT COMPLETE - 3+ VIEW COMPARISON:  None Available. FINDINGS: There is no evidence of fracture or dislocation. Cortical thickening of the second through fourth metatarsal shafts. No erosions. Plantar calcaneal spur. Soft tissue thickening about the dorsum of the foot. No soft tissue gas. No radiopaque foreign body. IMPRESSION: 1. Soft tissue thickening about the dorsum of the foot. No soft tissue gas or radiopaque foreign body. 2. Cortical thickening of the second through fourth metatarsal shafts, can be seen with stress reaction. Electronically Signed   By: Andrea Gasman M.D.   On: 10/20/2023 21:04   DG Hip Unilat W or Wo Pelvis 2-3 Views Right Result Date: 10/08/2023 CLINICAL DATA:  Fall last night.  Right hip pain. EXAM: DG HIP (WITH OR WITHOUT PELVIS) 2-3V RIGHT COMPARISON:  KUB 10/25/2021 FINDINGS: There is diffuse decreased bone mineralization. There is acute fracture of the junction of the pubic  body and the superior right pubic ramus with approximately 7 mm inferior displacement of the pubic body with respect to the superior pubic ramus. Additional nondisplaced fracture line lucency overlying the right inferior pubic ramus with minimal 1 mm superior cortical step-off. Moderate to severe pubic symphysis joint space narrowing, subchondral sclerosis, mild peripheral osteophytosis. Mild right femoroacetabular joint space narrowing and peripheral osteophytosis. Calcifications overlying the left hemipelvis compatible with vascular phleboliths versus fibroid calcifications, unchanged. IMPRESSION: 1. Acute fracture of the junction of the pubic body and the superior right pubic ramus with approximately 7 mm inferior displacement of the pubic body with respect to the superior pubic ramus. 2. Additional nondisplaced acute fracture of the right inferior pubic ramus. 3. Mild right femoroacetabular osteoarthritis. Electronically Signed   By: Tanda Lyons M.D.   On:  10/08/2023 11:04    Assessment/Plan 1. Bilateral leg cramps (Primary) Continue Oxycodone  and Robaxin  Scheduled She is on Sodium Tabs and magnesium for 3 days Not Getting up from the bed due to Pain If pain does not improve to d/w family about sending to ED Check CK ESR also  Also started on Meloxicam 15 mg for 7 days 2. Closed fracture of ramus of right pubis, sequela Not working with therapy due to Pain in her Legs  3. Swelling of right foot   4. Hyponatremia Gave Sodium tabs 1 gm for 3 days    Family/ staff Communication:   Labs/tests ordered:

## 2023-10-25 ENCOUNTER — Inpatient Hospital Stay (HOSPITAL_COMMUNITY)
Admission: EM | Admit: 2023-10-25 | Discharge: 2023-11-03 | DRG: 516 | Disposition: A | Discharge status: Skilled Nursing Facility | Source: Skilled Nursing Facility | Attending: Internal Medicine | Admitting: Internal Medicine

## 2023-10-25 ENCOUNTER — Observation Stay (HOSPITAL_COMMUNITY)

## 2023-10-25 ENCOUNTER — Encounter (HOSPITAL_COMMUNITY): Payer: Self-pay

## 2023-10-25 ENCOUNTER — Emergency Department (HOSPITAL_COMMUNITY)

## 2023-10-25 ENCOUNTER — Other Ambulatory Visit: Payer: Self-pay

## 2023-10-25 DIAGNOSIS — Z882 Allergy status to sulfonamides status: Secondary | ICD-10-CM

## 2023-10-25 DIAGNOSIS — W19XXXA Unspecified fall, initial encounter: Secondary | ICD-10-CM | POA: Diagnosis present

## 2023-10-25 DIAGNOSIS — M19071 Primary osteoarthritis, right ankle and foot: Secondary | ICD-10-CM | POA: Diagnosis not present

## 2023-10-25 DIAGNOSIS — R54 Age-related physical debility: Secondary | ICD-10-CM | POA: Diagnosis present

## 2023-10-25 DIAGNOSIS — S93401A Sprain of unspecified ligament of right ankle, initial encounter: Secondary | ICD-10-CM | POA: Diagnosis present

## 2023-10-25 DIAGNOSIS — M25559 Pain in unspecified hip: Secondary | ICD-10-CM | POA: Diagnosis not present

## 2023-10-25 DIAGNOSIS — F32A Depression, unspecified: Secondary | ICD-10-CM | POA: Diagnosis present

## 2023-10-25 DIAGNOSIS — M4807 Spinal stenosis, lumbosacral region: Secondary | ICD-10-CM | POA: Diagnosis not present

## 2023-10-25 DIAGNOSIS — Z7401 Bed confinement status: Secondary | ICD-10-CM | POA: Diagnosis not present

## 2023-10-25 DIAGNOSIS — Z79899 Other long term (current) drug therapy: Secondary | ICD-10-CM

## 2023-10-25 DIAGNOSIS — E222 Syndrome of inappropriate secretion of antidiuretic hormone: Secondary | ICD-10-CM | POA: Diagnosis not present

## 2023-10-25 DIAGNOSIS — R627 Adult failure to thrive: Secondary | ICD-10-CM | POA: Diagnosis present

## 2023-10-25 DIAGNOSIS — Z803 Family history of malignant neoplasm of breast: Secondary | ICD-10-CM

## 2023-10-25 DIAGNOSIS — M79606 Pain in leg, unspecified: Secondary | ICD-10-CM | POA: Diagnosis not present

## 2023-10-25 DIAGNOSIS — Z860100 Personal history of colon polyps, unspecified: Secondary | ICD-10-CM | POA: Diagnosis not present

## 2023-10-25 DIAGNOSIS — M62838 Other muscle spasm: Secondary | ICD-10-CM | POA: Diagnosis present

## 2023-10-25 DIAGNOSIS — Z9181 History of falling: Secondary | ICD-10-CM | POA: Diagnosis not present

## 2023-10-25 DIAGNOSIS — S3993XA Unspecified injury of pelvis, initial encounter: Secondary | ICD-10-CM | POA: Diagnosis not present

## 2023-10-25 DIAGNOSIS — Z853 Personal history of malignant neoplasm of breast: Secondary | ICD-10-CM | POA: Diagnosis not present

## 2023-10-25 DIAGNOSIS — Y92129 Unspecified place in nursing home as the place of occurrence of the external cause: Secondary | ICD-10-CM

## 2023-10-25 DIAGNOSIS — I10 Essential (primary) hypertension: Secondary | ICD-10-CM | POA: Diagnosis not present

## 2023-10-25 DIAGNOSIS — S82401A Unspecified fracture of shaft of right fibula, initial encounter for closed fracture: Secondary | ICD-10-CM | POA: Diagnosis not present

## 2023-10-25 DIAGNOSIS — E871 Hypo-osmolality and hyponatremia: Secondary | ICD-10-CM | POA: Diagnosis present

## 2023-10-25 DIAGNOSIS — S3289XG Fracture of other parts of pelvis, subsequent encounter for fracture with delayed healing: Secondary | ICD-10-CM | POA: Diagnosis not present

## 2023-10-25 DIAGNOSIS — S32509A Unspecified fracture of unspecified pubis, initial encounter for closed fracture: Secondary | ICD-10-CM | POA: Diagnosis present

## 2023-10-25 DIAGNOSIS — Z820 Family history of epilepsy and other diseases of the nervous system: Secondary | ICD-10-CM

## 2023-10-25 DIAGNOSIS — G629 Polyneuropathy, unspecified: Secondary | ICD-10-CM | POA: Diagnosis not present

## 2023-10-25 DIAGNOSIS — Z9049 Acquired absence of other specified parts of digestive tract: Secondary | ICD-10-CM

## 2023-10-25 DIAGNOSIS — R531 Weakness: Secondary | ICD-10-CM | POA: Diagnosis not present

## 2023-10-25 DIAGNOSIS — S3210XA Unspecified fracture of sacrum, initial encounter for closed fracture: Secondary | ICD-10-CM | POA: Diagnosis not present

## 2023-10-25 DIAGNOSIS — Z888 Allergy status to other drugs, medicaments and biological substances status: Secondary | ICD-10-CM

## 2023-10-25 DIAGNOSIS — Z825 Family history of asthma and other chronic lower respiratory diseases: Secondary | ICD-10-CM

## 2023-10-25 DIAGNOSIS — D62 Acute posthemorrhagic anemia: Secondary | ICD-10-CM | POA: Diagnosis not present

## 2023-10-25 DIAGNOSIS — M51369 Other intervertebral disc degeneration, lumbar region without mention of lumbar back pain or lower extremity pain: Secondary | ICD-10-CM | POA: Diagnosis not present

## 2023-10-25 DIAGNOSIS — E860 Dehydration: Secondary | ICD-10-CM | POA: Diagnosis not present

## 2023-10-25 DIAGNOSIS — J45909 Unspecified asthma, uncomplicated: Secondary | ICD-10-CM | POA: Diagnosis present

## 2023-10-25 DIAGNOSIS — Z7982 Long term (current) use of aspirin: Secondary | ICD-10-CM

## 2023-10-25 DIAGNOSIS — S32301A Unspecified fracture of right ilium, initial encounter for closed fracture: Secondary | ICD-10-CM | POA: Diagnosis not present

## 2023-10-25 DIAGNOSIS — S99911A Unspecified injury of right ankle, initial encounter: Secondary | ICD-10-CM | POA: Diagnosis not present

## 2023-10-25 DIAGNOSIS — Z791 Long term (current) use of non-steroidal anti-inflammatories (NSAID): Secondary | ICD-10-CM

## 2023-10-25 DIAGNOSIS — M810 Age-related osteoporosis without current pathological fracture: Secondary | ICD-10-CM | POA: Diagnosis present

## 2023-10-25 DIAGNOSIS — M25551 Pain in right hip: Secondary | ICD-10-CM | POA: Diagnosis not present

## 2023-10-25 DIAGNOSIS — S329XXA Fracture of unspecified parts of lumbosacral spine and pelvis, initial encounter for closed fracture: Secondary | ICD-10-CM | POA: Diagnosis not present

## 2023-10-25 DIAGNOSIS — M4185 Other forms of scoliosis, thoracolumbar region: Secondary | ICD-10-CM | POA: Diagnosis not present

## 2023-10-25 DIAGNOSIS — Z8249 Family history of ischemic heart disease and other diseases of the circulatory system: Secondary | ICD-10-CM | POA: Diagnosis not present

## 2023-10-25 DIAGNOSIS — R296 Repeated falls: Secondary | ICD-10-CM | POA: Diagnosis not present

## 2023-10-25 DIAGNOSIS — S32591D Other specified fracture of right pubis, subsequent encounter for fracture with routine healing: Secondary | ICD-10-CM | POA: Diagnosis not present

## 2023-10-25 DIAGNOSIS — S32810A Multiple fractures of pelvis with stable disruption of pelvic ring, initial encounter for closed fracture: Secondary | ICD-10-CM | POA: Diagnosis not present

## 2023-10-25 DIAGNOSIS — S32512A Fracture of superior rim of left pubis, initial encounter for closed fracture: Secondary | ICD-10-CM | POA: Diagnosis not present

## 2023-10-25 DIAGNOSIS — Z823 Family history of stroke: Secondary | ICD-10-CM

## 2023-10-25 DIAGNOSIS — S329XXG Fracture of unspecified parts of lumbosacral spine and pelvis, subsequent encounter for fracture with delayed healing: Principal | ICD-10-CM

## 2023-10-25 DIAGNOSIS — F325 Major depressive disorder, single episode, in full remission: Secondary | ICD-10-CM | POA: Diagnosis present

## 2023-10-25 DIAGNOSIS — M6281 Muscle weakness (generalized): Secondary | ICD-10-CM | POA: Diagnosis not present

## 2023-10-25 DIAGNOSIS — Z66 Do not resuscitate: Secondary | ICD-10-CM | POA: Diagnosis not present

## 2023-10-25 DIAGNOSIS — S32501A Unspecified fracture of right pubis, initial encounter for closed fracture: Secondary | ICD-10-CM | POA: Diagnosis not present

## 2023-10-25 DIAGNOSIS — K5730 Diverticulosis of large intestine without perforation or abscess without bleeding: Secondary | ICD-10-CM | POA: Diagnosis not present

## 2023-10-25 DIAGNOSIS — S32511A Fracture of superior rim of right pubis, initial encounter for closed fracture: Secondary | ICD-10-CM | POA: Diagnosis not present

## 2023-10-25 DIAGNOSIS — R2689 Other abnormalities of gait and mobility: Secondary | ICD-10-CM | POA: Diagnosis not present

## 2023-10-25 DIAGNOSIS — S32591S Other specified fracture of right pubis, sequela: Secondary | ICD-10-CM | POA: Diagnosis not present

## 2023-10-25 DIAGNOSIS — S32309A Unspecified fracture of unspecified ilium, initial encounter for closed fracture: Secondary | ICD-10-CM | POA: Diagnosis not present

## 2023-10-25 DIAGNOSIS — S32591A Other specified fracture of right pubis, initial encounter for closed fracture: Secondary | ICD-10-CM | POA: Diagnosis not present

## 2023-10-25 DIAGNOSIS — D649 Anemia, unspecified: Secondary | ICD-10-CM | POA: Diagnosis not present

## 2023-10-25 DIAGNOSIS — R748 Abnormal levels of other serum enzymes: Secondary | ICD-10-CM | POA: Diagnosis not present

## 2023-10-25 DIAGNOSIS — D0512 Intraductal carcinoma in situ of left breast: Secondary | ICD-10-CM | POA: Diagnosis present

## 2023-10-25 LAB — URINALYSIS, ROUTINE W REFLEX MICROSCOPIC
Bacteria, UA: NONE SEEN
Bilirubin Urine: NEGATIVE
Glucose, UA: NEGATIVE mg/dL
Ketones, ur: 5 mg/dL — AB
Leukocytes,Ua: NEGATIVE
Nitrite: NEGATIVE
Protein, ur: NEGATIVE mg/dL
Specific Gravity, Urine: 1.015 (ref 1.005–1.030)
pH: 6 (ref 5.0–8.0)

## 2023-10-25 LAB — BASIC METABOLIC PANEL WITH GFR
Anion gap: 13 (ref 5–15)
BUN: 12 mg/dL (ref 8–23)
CO2: 24 mmol/L (ref 22–32)
Calcium: 8.6 mg/dL — ABNORMAL LOW (ref 8.9–10.3)
Chloride: 90 mmol/L — ABNORMAL LOW (ref 98–111)
Creatinine, Ser: 0.46 mg/dL (ref 0.44–1.00)
GFR, Estimated: >60 mL/min (ref >60)
Glucose, Bld: 123 mg/dL — ABNORMAL HIGH (ref 70–99)
Potassium: 4.4 mmol/L (ref 3.5–5.1)
Sodium: 127 mmol/L — ABNORMAL LOW (ref 135–145)

## 2023-10-25 LAB — CBC
HCT: 35.7 % — ABNORMAL LOW (ref 36.0–46.0)
Hemoglobin: 12 g/dL (ref 12.0–15.0)
MCH: 27.6 pg (ref 26.0–34.0)
MCHC: 33.6 g/dL (ref 30.0–36.0)
MCV: 82.3 fL (ref 80.0–100.0)
Platelets: 437 10*3/uL — ABNORMAL HIGH (ref 150–400)
RBC: 4.34 MIL/uL (ref 3.87–5.11)
RDW: 13.2 % (ref 11.5–15.5)
WBC: 10.9 10*3/uL — ABNORMAL HIGH (ref 4.0–10.5)
nRBC: 0 % (ref 0.0–0.2)

## 2023-10-25 LAB — TROPONIN I (HIGH SENSITIVITY): Troponin I (High Sensitivity): 6 ng/L (ref <18)

## 2023-10-25 MED ORDER — DIAZEPAM 5 MG/ML IJ SOLN
2.5000 mg | Freq: Three times a day (TID) | INTRAMUSCULAR | Status: DC | PRN
  Administered 2023-10-26 – 2023-10-27 (×2): 2.5 mg via INTRAVENOUS
  Filled 2023-10-25 (×3): qty 2

## 2023-10-25 MED ORDER — CALCIUM CARBONATE ANTACID 500 MG PO CHEW
1.0000 | CHEWABLE_TABLET | Freq: Two times a day (BID) | ORAL | Status: DC | PRN
  Administered 2023-10-25: 200 mg via ORAL
  Filled 2023-10-25: qty 1

## 2023-10-25 MED ORDER — MIDAZOLAM HCL 2 MG/2ML IJ SOLN
2.0000 mg | Freq: Once | INTRAMUSCULAR | Status: AC
  Administered 2023-10-25: 2 mg via INTRAVENOUS
  Filled 2023-10-25: qty 2

## 2023-10-25 MED ORDER — DOCUSATE SODIUM 100 MG PO CAPS
100.0000 mg | ORAL_CAPSULE | Freq: Two times a day (BID) | ORAL | Status: DC
  Administered 2023-10-25 – 2023-11-03 (×17): 100 mg via ORAL
  Filled 2023-10-25 (×17): qty 1

## 2023-10-25 MED ORDER — VITAMIN B-12 1000 MCG PO TABS
1000.0000 ug | ORAL_TABLET | Freq: Every day | ORAL | Status: DC
  Administered 2023-10-26 – 2023-11-03 (×8): 1000 ug via ORAL
  Filled 2023-10-25 (×8): qty 1

## 2023-10-25 MED ORDER — MELOXICAM 15 MG PO TABS: 15.0000 mg | ORAL_TABLET | Freq: Every day | ORAL | Status: DC

## 2023-10-25 MED ORDER — SODIUM CHLORIDE 0.9 % IV SOLN: INTRAVENOUS | Status: AC

## 2023-10-25 MED ORDER — SODIUM CHLORIDE 0.9 % IV BOLUS
250.0000 mL | Freq: Once | INTRAVENOUS | Status: AC
  Administered 2023-10-25: 250 mL via INTRAVENOUS

## 2023-10-25 MED ORDER — POLYETHYLENE GLYCOL 3350 17 G PO PACK
17.0000 g | PACK | Freq: Every day | ORAL | Status: DC
  Administered 2023-10-28: 17 g via ORAL
  Filled 2023-10-25 (×4): qty 1

## 2023-10-25 MED ORDER — PANTOPRAZOLE SODIUM 40 MG PO TBEC
40.0000 mg | DELAYED_RELEASE_TABLET | Freq: Every day | ORAL | Status: DC
  Administered 2023-10-26 – 2023-11-03 (×8): 40 mg via ORAL
  Filled 2023-10-25 (×8): qty 1

## 2023-10-25 MED ORDER — ONDANSETRON HCL 4 MG PO TABS: 4.0000 mg | ORAL_TABLET | Freq: Four times a day (QID) | ORAL | Status: DC | PRN

## 2023-10-25 MED ORDER — DIAZEPAM 5 MG PO TABS: 5.0000 mg | ORAL_TABLET | Freq: Once | ORAL | Status: DC

## 2023-10-25 MED ORDER — ENOXAPARIN SODIUM 40 MG/0.4ML IJ SOSY
40.0000 mg | PREFILLED_SYRINGE | Freq: Every day | INTRAMUSCULAR | Status: DC
  Administered 2023-10-25 – 2023-11-02 (×8): 40 mg via SUBCUTANEOUS
  Filled 2023-10-25 (×8): qty 0.4

## 2023-10-25 MED ORDER — ONDANSETRON HCL 4 MG/2ML IJ SOLN: 4.0000 mg | Freq: Four times a day (QID) | INTRAMUSCULAR | Status: DC | PRN

## 2023-10-25 MED ORDER — AMLODIPINE BESYLATE 2.5 MG PO TABS
2.5000 mg | ORAL_TABLET | Freq: Every day | ORAL | Status: DC
  Administered 2023-10-26 – 2023-11-03 (×8): 2.5 mg via ORAL
  Filled 2023-10-25 (×8): qty 1

## 2023-10-25 MED ORDER — METHOCARBAMOL 1000 MG/10ML IJ SOLN
500.0000 mg | Freq: Four times a day (QID) | INTRAMUSCULAR | Status: DC | PRN
  Administered 2023-10-25 – 2023-10-28 (×7): 500 mg via INTRAVENOUS
  Filled 2023-10-25 (×7): qty 10

## 2023-10-25 MED ORDER — HYDROCODONE-ACETAMINOPHEN 5-325 MG PO TABS
1.0000 | ORAL_TABLET | ORAL | Status: DC | PRN
  Administered 2023-10-25 (×2): 2 via ORAL
  Administered 2023-10-26 (×2): 1 via ORAL
  Administered 2023-10-26 – 2023-10-27 (×4): 2 via ORAL
  Filled 2023-10-25 (×2): qty 2
  Filled 2023-10-25: qty 1
  Filled 2023-10-25 (×5): qty 2

## 2023-10-25 MED ORDER — FENTANYL CITRATE PF 50 MCG/ML IJ SOSY
12.5000 ug | PREFILLED_SYRINGE | INTRAMUSCULAR | Status: DC | PRN
  Administered 2023-10-26: 12.5 ug via INTRAVENOUS
  Administered 2023-10-27: 50 ug via INTRAVENOUS
  Filled 2023-10-25 (×2): qty 1

## 2023-10-25 MED ORDER — FOLIC ACID 1 MG PO TABS
1.0000 mg | ORAL_TABLET | Freq: Every day | ORAL | Status: DC
  Administered 2023-10-25 – 2023-11-02 (×9): 1 mg via ORAL
  Filled 2023-10-25 (×9): qty 1

## 2023-10-25 MED ORDER — ACETAMINOPHEN 650 MG RE SUPP: 650.0000 mg | Freq: Four times a day (QID) | RECTAL | Status: DC | PRN

## 2023-10-25 MED ORDER — VITAMIN D 25 MCG (1000 UNIT) PO TABS
1000.0000 [IU] | ORAL_TABLET | Freq: Every day | ORAL | Status: DC
  Administered 2023-10-26 – 2023-11-03 (×8): 1000 [IU] via ORAL
  Filled 2023-10-25 (×8): qty 1

## 2023-10-25 MED ORDER — ACETAMINOPHEN 325 MG PO TABS
650.0000 mg | ORAL_TABLET | Freq: Four times a day (QID) | ORAL | Status: DC | PRN
  Administered 2023-10-28 – 2023-10-30 (×3): 650 mg via ORAL
  Filled 2023-10-25 (×3): qty 2

## 2023-10-25 MED ORDER — MIRTAZAPINE 15 MG PO TABS
7.5000 mg | ORAL_TABLET | Freq: Every day | ORAL | Status: DC
  Administered 2023-10-25 – 2023-11-02 (×8): 7.5 mg via ORAL
  Filled 2023-10-25 (×8): qty 1

## 2023-10-25 MED ORDER — LACTATED RINGERS IV BOLUS
250.0000 mL | Freq: Once | INTRAVENOUS | Status: AC
  Administered 2023-10-25: 250 mL via INTRAVENOUS

## 2023-10-25 MED ORDER — FENTANYL CITRATE PF 50 MCG/ML IJ SOSY: 25.0000 ug | PREFILLED_SYRINGE | Freq: Once | INTRAMUSCULAR | Status: DC

## 2023-10-25 MED ORDER — ATENOLOL 25 MG PO TABS
25.0000 mg | ORAL_TABLET | Freq: Every day | ORAL | Status: DC
  Administered 2023-10-25 – 2023-11-02 (×9): 25 mg via ORAL
  Filled 2023-10-25 (×9): qty 1

## 2023-10-25 NOTE — ED Provider Notes (Signed)
 [place] EMERGENCY DEPARTMENT AT [facility] Provider Note   CSN: [id number] Arrival date & time: 10/25/23  1055     Patient presents with: Leg Pain   Tammy Lindsey is a 89 y.o. female.   HPI 89 year old female who had a fall 17 days ago.  At that time she had a discharge superior and inferior right ramus fracture.  She was treated inpatient for pain and discharged back to [facility].  She had been being mobilized somewhat with walker.  However, for the past 3 days she has had increasing pain and appears to be having severe spasms at this time.  Simultaneously, she has been having decreased p.o. intake although no active vomiting or diarrhea.  She is taking muscle relaxants and meloxicam  for pain.  Her daughter states that the only thing that seems to be helping is putting heat on her legs.  She is tearful on my evaluation.     Prior to Admission medications   Medication Sig Start Date End Date Taking? Authorizing Provider  acetaminophen  (TYLENOL ) 500 MG tablet Take 500 mg by mouth every 8 (eight) hours as needed for mild pain (pain score 1-3) (or headaches).   Yes [provider]  amLODipine  (NORVASC ) 5 MG tablet Take 2.5 mg by mouth in the morning.   Yes [provider]  aspirin  EC 325 MG tablet Take 1 tablet (325 mg total) by mouth daily. 11/01/23 12/01/23 Yes McClung, Sarah A, PA-C  atenolol  (TENORMIN ) 25 MG tablet Take 25 mg by mouth at bedtime.   Yes [provider]  Cholecalciferol  (VITAMIN D3) 1000 units CAPS Take 2,000 Units by mouth in the morning. Patient taking differently: Take 1,000 Units by mouth in the morning. 10/11/23  Yes Rai, Ripudeep K, MD  Cyanocobalamin  (B-12) 1000 MCG TABS Take 1,000 mcg by mouth in the morning.   Yes [provider]  diclofenac Sodium (VOLTAREN) 1 % GEL Apply 1 Application topically every 6 (six) hours as needed (for pain).   Yes [provider]  docusate sodium  (COLACE) 100 MG capsule Take 1  capsule (100 mg total) by mouth 2 (two) times daily. 10/11/23  Yes Rai, Ripudeep K, MD  folic acid  (FOLVITE ) 800 MCG tablet Take 800 mcg by mouth at bedtime.   Yes [provider]  hydrALAZINE  (APRESOLINE ) 10 MG tablet Take 1 tablet (10 mg total) by mouth as needed (for blood pressure 150 or above). 08/11/23  Yes Charlanne Fredia CROME, MD  lidocaine  4 % Place 1 patch onto the skin in the morning and at bedtime.   Yes [provider]  mirtazapine  (REMERON ) 15 MG tablet Take 7.5 mg by mouth at bedtime.   Yes [provider]  ondansetron  (ZOFRAN ) 4 MG tablet Take 4 mg by mouth every 6 (six) hours as needed for nausea or vomiting.   Yes [provider]  oxyCODONE  (OXY IR/ROXICODONE ) 5 MG immediate release tablet Take 1 tablet (5 mg total) by mouth every 8 (eight) hours as needed for severe pain (pain score 7-10). 10/24/23  Yes Fargo, Amy E, NP  pantoprazole  (PROTONIX ) 40 MG tablet Take 1 tablet (40 mg total) by mouth daily. 07/04/23  Yes Charlanne Fredia CROME, MD  polyethylene glycol (MIRALAX  / GLYCOLAX ) 17 g packet Take 17 g by mouth daily. 10/12/23  Yes Rai, Ripudeep K, MD  SYSTANE COMPLETE PF 0.6 % SOLN Place 1 drop into both eyes 3 (three) times daily as needed (for dryness).   Yes [provider]  methocarbamol  (ROBAXIN ) 500 MG tablet Take 1 tablet (500 mg total) by mouth every 6 (six) hours as needed for muscle spasms. 11/01/23   Danton Lauraine LABOR, PA-C  oxyCODONE  (OXY IR/ROXICODONE ) 5 MG immediate release tablet Take 0.5-1 tablets (2.5-5 mg total) by mouth every 4 (four) hours as needed for moderate pain (pain score 4-6) or severe pain (pain score 7-10) (2.5 mg pain score 4-6, 5 mg pain score 7-10). 11/01/23   Danton Lauraine LABOR, PA-C  sodium chloride  1 g tablet Take 1 g by mouth once. Patient not taking: Reported on 10/25/2023    [provider]    Allergies: Arimidex  [anastrozole ] and Sulfa antibiotics    Review of Systems  Updated Vital Signs BP 132/63 (BP  Location: Left Arm)   Pulse 71   Temp 98.2 F (36.8 C)   Resp 16   Ht 1.575 m (5' 2)   Wt 52.1 kg   SpO2 98%   BMI 21.01 kg/m   Physical Exam Vitals reviewed.  HENT:     Head: Normocephalic.     Right Ear: External ear normal.     Left Ear: External ear normal.     Nose: Nose normal.     Mouth/Throat:     Pharynx: Oropharynx is clear.  Eyes:     Pupils: Pupils are equal, round, and reactive to light.  Cardiovascular:     Rate and Rhythm: Normal rate and regular rhythm.     Pulses: Normal pulses.  Pulmonary:     Effort: Pulmonary effort is normal.     Breath sounds: Normal breath sounds.  Abdominal:     General: There is no distension.     Palpations: Abdomen is soft.     Tenderness: There is no abdominal tenderness.  Musculoskeletal:     Cervical back: Normal range of motion.     Comments: Patient with flexion at hips.  She reports having muscle spasms in bilateral femoral muscles.  Diffusely tender over pelvis, symphysis pubis, and posteriorly including over her lumbar spine.  There is some mottling of skin on the left anterior thigh where the heating pad was laying. No skin breakdown is noted Pulses are intact  Skin:    General: Skin is warm and dry.     Capillary Refill: Capillary refill takes less than 2 seconds.  Neurological:     General: No focal deficit present.     Mental Status: She is alert.  Psychiatric:        Mood and Affect: Mood normal.     (all labs ordered are listed, but only abnormal results are displayed) Labs Reviewed  CBC - Abnormal; Notable for the following components:      Result Value   WBC 10.9 (*)    HCT 35.7 (*)    Platelets 437 (*)    All other components within normal limits  BASIC METABOLIC PANEL WITH GFR - Abnormal; Notable for the following components:   Sodium 127 (*)    Chloride 90 (*)    Glucose, Bld 123 (*)    Calcium  8.6 (*)    All other components within normal limits  URINALYSIS, ROUTINE W REFLEX MICROSCOPIC -  Abnormal; Notable for the following components:   Hgb urine dipstick SMALL (*)    Ketones, ur 5 (*)    All other components within normal limits  CBC WITH DIFFERENTIAL/PLATELET - Abnormal; Notable for the following components:   RBC 3.68 (*)    Hemoglobin 10.3 (*)  HCT 30.9 (*)    All other components within normal limits  BASIC METABOLIC PANEL WITH GFR - Abnormal; Notable for the following components:   Sodium 128 (*)    Chloride 96 (*)    Glucose, Bld 125 (*)    Calcium  8.0 (*)    All other components within normal limits  BASIC METABOLIC PANEL WITH GFR - Abnormal; Notable for the following components:   Sodium 130 (*)    Chloride 95 (*)    Glucose, Bld 110 (*)    Calcium  8.7 (*)    All other components within normal limits  CBC WITH DIFFERENTIAL/PLATELET - Abnormal; Notable for the following components:   Hemoglobin 11.6 (*)    HCT 35.3 (*)    Platelets 446 (*)    All other components within normal limits  CK - Abnormal; Notable for the following components:   Total CK 25 (*)    All other components within normal limits  BASIC METABOLIC PANEL WITH GFR - Abnormal; Notable for the following components:   Sodium 131 (*)    Chloride 96 (*)    Glucose, Bld 102 (*)    All other components within normal limits  BASIC METABOLIC PANEL WITH GFR - Abnormal; Notable for the following components:   Sodium 130 (*)    Chloride 96 (*)    Glucose, Bld 101 (*)    Calcium  8.6 (*)    All other components within normal limits  CBC WITH DIFFERENTIAL/PLATELET - Abnormal; Notable for the following components:   Hemoglobin 11.1 (*)    HCT 32.2 (*)    Platelets 437 (*)    All other components within normal limits  BASIC METABOLIC PANEL WITH GFR - Abnormal; Notable for the following components:   Sodium 132 (*)    Glucose, Bld 117 (*)    Calcium  8.4 (*)    All other components within normal limits  CBC - Abnormal; Notable for the following components:   RBC 3.79 (*)    Hemoglobin 10.4  (*)    HCT 31.3 (*)    All other components within normal limits  CBC - Abnormal; Notable for the following components:   RBC 3.86 (*)    Hemoglobin 10.5 (*)    HCT 31.7 (*)    All other components within normal limits  SURGICAL PCR SCREEN  TROPONIN I (HIGH SENSITIVITY)  TROPONIN I (HIGH SENSITIVITY)    EKG: None  Radiology: No results found.    Procedures   Medications Ordered in the ED  amLODipine  (NORVASC ) tablet 2.5 mg (2.5 mg Oral Given 11/01/23 0855)  atenolol  (TENORMIN ) tablet 25 mg (25 mg Oral Given 11/01/23 2107)  mirtazapine  (REMERON ) tablet 7.5 mg (7.5 mg Oral Given 11/01/23 2057)  docusate sodium  (COLACE) capsule 100 mg (100 mg Oral Given 11/01/23 2057)  pantoprazole  (PROTONIX ) EC tablet 40 mg (40 mg Oral Given 11/01/23 0855)  cyanocobalamin  (VITAMIN B12) tablet 1,000 mcg (1,000 mcg Oral Given 11/01/23 0854)  folic acid  (FOLVITE ) tablet 1 mg (1 mg Oral Given 11/01/23 2057)  cholecalciferol  (VITAMIN D3) 25 MCG (1000 UNIT) tablet 1,000 Units (1,000 Units Oral Given 11/01/23 0854)  enoxaparin  (LOVENOX ) injection 40 mg (40 mg Subcutaneous Given 11/01/23 2057)  0.9 %  sodium chloride  infusion (0 mLs Intravenous Stopped 10/26/23 2049)  ondansetron  (ZOFRAN ) tablet 4 mg ( Oral MAR Unhold 10/30/23 1512)    Or  ondansetron  (ZOFRAN ) injection 4 mg ( Intravenous MAR Unhold 10/30/23 1512)  calcium  carbonate (TUMS - dosed in mg  elemental calcium ) chewable tablet 200 mg of elemental calcium  ( Oral MAR Unhold 10/30/23 1512)  HYDROmorphone  (DILAUDID ) injection 0.5 mg (0.5 mg Intravenous Given 11/02/23 0743)  lidocaine  (LIDODERM ) 5 % 2 patch (2 patches Transdermal Patch Removed 11/01/23 2053)  nitroGLYCERIN  (NITROSTAT ) SL tablet 0.4 mg ( Sublingual MAR Unhold 10/30/23 1512)  polyethylene glycol (MIRALAX  / GLYCOLAX ) packet 17 g (17 g Oral Given 11/01/23 2058)  ceFAZolin  (ANCEF ) 2-4 GM/100ML-% IVPB (has no administration in time range)  oxyCODONE  (Oxy IR/ROXICODONE ) immediate release tablet 2.5-5 mg (5 mg Oral  Given 11/01/23 2315)  metoCLOPramide  (REGLAN ) tablet 5-10 mg (has no administration in time range)    Or  metoCLOPramide  (REGLAN ) injection 5-10 mg (has no administration in time range)  acetaminophen  (TYLENOL ) tablet 1,000 mg (1,000 mg Oral Given 11/02/23 0605)  diazepam  (VALIUM ) tablet 2 mg (2 mg Oral Given 11/02/23 0743)  methocarbamol  (ROBAXIN ) tablet 500 mg (500 mg Oral Given 11/01/23 2058)  lactated ringers  bolus 250 mL (0 mLs Intravenous Stopped 10/25/23 1346)  midazolam  (VERSED ) injection 2 mg (2 mg Intravenous Given 10/25/23 1214)  sodium chloride  0.9 % bolus 250 mL (0 mLs Intravenous Stopped 10/26/23 2049)  midazolam  (VERSED ) injection 2 mg (2 mg Intravenous Given 10/25/23 1439)  chlorhexidine  (PERIDEX ) 0.12 % solution 15 mL (15 mLs Mouth/Throat Given 10/30/23 1208)    Or  Oral care mouth rinse ( Mouth Rinse See Alternative 10/30/23 1208)  ceFAZolin  (ANCEF ) IVPB 2g/100 mL premix (2 g Intravenous New Bag/Given 10/31/23 9373)                                    Medical Decision Making Amount and/or Complexity of Data Reviewed Labs: ordered. Radiology: ordered.  Risk Prescription drug management. Decision regarding hospitalization.   Patient had labs obtained to evaluate for other etiologies of muscle spasm including electrolyte abnormalities.  Sodium has decreased from 133-127, chloride decreased from 98-90.  She has a mild leukocytosis of 10,900.  Her hemoglobin is stable.  Normal saline is started. She has had poor pain control at her facility with oxycodone , nonsteroidal anti-inflammatory, and muscle relaxants. Pain was well-controlled with a dose of Versed .  However has now returned.  I am giving her p.o. Valium  and repeating the dose of Versed . Imaging was obtained of her pelvis and lumbar spine.  On CT there is presence of the known right pubic rami fractures and additional subtle nondisplaced fractures of the right ilium and right hemisacrum. Lumbar spine does not show any evidence of  fracture. Discussed with the patient and daughter.  Plan admission for correction of hyponatremia. This may be contributing to her muscle spasm.  Muscle spasm does appear controlled with the benzodiazepines better than with the narcotics she was receiving in the facility. Care discussed with Dr. Rojelio who excepted patient for admission     Final diagnoses:  Closed displaced fracture of pelvis with delayed healing, unspecified part of pelvis, subsequent encounter  Muscle spasm    ED Discharge Orders          Ordered    oxyCODONE  (OXY IR/ROXICODONE ) 5 MG immediate release tablet  Every 4 hours PRN        11/01/23 0822    methocarbamol  (ROBAXIN ) 500 MG tablet  Every 6 hours PRN        11/01/23 0822    aspirin  EC 325 MG tablet  Daily        11/01/23 0822  Levander Houston, MD 11/02/23 1010

## 2023-10-25 NOTE — Progress Notes (Signed)
 Orthopedics Progress Note  Subjective: Asked by patient family to see patient for pelvis fracture. Patient fell several weeks ago and was admitted with right sided superior and inferior pubic ramus fractures. She was transferred to SNF rehab at [facility] and initially by report was moving ok and pain was tolerable. Over the last three days pain has dramatically increased and bilateral leg spasms are severe. Patient has known spinal stenosis as well and has had ESIs as an outpatient. Patient represented to [facility] for uncontrolled pain and failure to thrive.   Objective:  Vitals:   10/25/23 1637 10/25/23 1849  BP: (!) 149/68 (!) 118/52  Pulse: 81 85  Resp: 16 20  Temp: 98.4 F (36.9 C)   SpO2: 98% 99%    General: Awake and alert  Musculoskeletal: Patient is in moderate distress in bed. Very tender over the right sacrum area. Minimal pain with gentle log roll right leg. Leg lengths equal. Right ankle and foot is swollen. Loss of ROM due to pain and swelling. (CT pending) She can wiggle toes. Sensation intact  Neurovascularly intact  Lab Results  Component Value Date   WBC 10.9 (H) 10/25/2023   HGB 12.0 10/25/2023   HCT 35.7 (L) 10/25/2023   MCV 82.3 10/25/2023   PLT 437 (H) 10/25/2023       Component Value Date/Time   NA 127 (L) 10/25/2023 1215   NA 133 (A) 10/16/2023 0000   K 4.4 10/25/2023 1215   CL 90 (L) 10/25/2023 1215   CO2 24 10/25/2023 1215   GLUCOSE 123 (H) 10/25/2023 1215   BUN 12 10/25/2023 1215   BUN 15 10/16/2023 0000   CREATININE 0.46 10/25/2023 1215   CREATININE 0.68 11/24/2021 0818   CALCIUM 8.6 (L) 10/25/2023 1215   GFRNONAA >60 10/25/2023 1215   GFRNONAA >60 11/24/2021 0818   GFRAA  07/24/2010 0600    >60        The eGFR has been calculated using the MDRD equation. This calculation has not been validated in all clinical situations. eGFR's persistently <60 mL/min signify possible Chronic Kidney Disease.    Lab Results  Component Value Date   INR  1.1 10/08/2023   INR 0.97 07/15/2010    Assessment/Plan: Right unstable pelvis fracture after fall. Recommend strict NWB right LE. Pivot transfers on Left LE, bed to chair only. No gait training.  I am discussing this case with Dr Kendal as well (pelvis specialist) CT ankle pending. Decubitus and DVT prophylaxis Will follow  Elspeth SAUNDERS. Kay, MD 10/25/2023 7:24 PM

## 2023-10-25 NOTE — H&P (Addendum)
 History and Physical    Tammy Lindsey FMW:[id number] DOB: [birth year] DOA: 10/25/2023  PCP: Charlanne Fredia CROME, MD  Patient coming from: SNF [facility]   Chief Complaint: Uncontrolled pain  HPI: Tammy Lindsey is a 89 y.o. female with medical history significant of history of breast cancer, depression, hypertension, neuropathy.  Patient was recently hospitalized 6/15 to 6/19 after presenting with mechanical fall.  During that hospitalization, she was found to have pubic bone fracture.  Orthopedic surgery was consulted at that time, recommended weight-bear as tolerated and physical therapy.  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  She went to skilled nursing facility on 6/19.  She came back to the emergency department on 6/27 with right foot pain and swelling.  X-ray was done which was unremarkable other than soft tissue swelling.  She was placed in a postop shoe and discharged back to SNF.    She now returns with intractable pain.  Also with poor p.o. intake. During my evaluation, patient was somnolent due to recently receiving IV versed in the emergency department for pain control. Daughter is at bedside who provides history.  She states that patient was doing well with physical therapy, ambulating, but a couple of days ago patient's pain worsened.  Patient was having severe muscle spasms in her thighs.  SNF physician increased her pain regimen with oxycodone  and Robaxin , without relief.  Due uncontrolled pain, patient was having decreased p.o. intake and appetite.  ED Course: BMP showed sodium 127, down from 133 a few weeks ago.  UA is negative.  Imaging revealed known right pubic rami fracture as well as right sided ilium and right hemisacrum fracture.  Patient was given versed with improvement in pain.  Review of Systems: As per HPI. Otherwise, all other review of systems reviewed and are negative.   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   Diverticulosis    Esophageal reflux    Family history of breast cancer 11/25/2021   Hiatal hernia    Hx of colonic polyps    Hypertension    Per new patient packet   Mild asthma    Per new patient packet   Osteoarthritis    Osteopenia    bone density 1-09 and 2-13   Pulmonary nodules    scattered-CT Scan July 2010, 12-10, and 04-2010-all stable felt benign     Past Surgical History:  Procedure Laterality Date   BREAST LUMPECTOMY WITH RADIOACTIVE SEED LOCALIZATION Left 12/07/2021   Procedure: LEFT BREAST LUMPECTOMY WITH RADIOACTIVE SEED LOCALIZATION;  Surgeon: Curvin Deward MOULD, MD;  Location: [facility];  Service: General;  Laterality: Left;   CHOLECYSTECTOMY  04/25/1972   Gall Bladder; [facility]   DG PORTABLE CHEST X RAY ([facility] HX)  2023   Shoulder   DIAGNOSTIC MAMMOGRAM  2022   [facility]; Per new patient packet   JOINT REPLACEMENT Left 08/24/2010   JOINT REPLACEMENT Left 04/25/2010   Reverse    orthroscopic  Rotator Cuff Left 04/25/2009   Partial shoulder Left 04/26/2003   ROTATOR CUFF REPAIR     x 2 1998 and 1999   SPINE SURGERY  04/26/1983   disk   TONSILLECTOMY  04/25/1950   Dr. Jerome; Per new patient packet     reports that she has never smoked. She has never used smokeless tobacco. She reports that she does not currently use alcohol. She reports  that she does not use drugs.  Allergies  Allergen Reactions   Arimidex  [Anastrozole ] Other (See Comments)    Caused excessive sweating. Allergy not listed on MAR   Sulfa Antibiotics Other (See Comments)    The patient said she was told when she was 17 she was allergic to this.    Family History  Problem Relation Age of Onset   Cancer Mother        cervical   Alzheimer's disease Mother    Asthma Father    Other Father        Cerebral hemorrhage   Heart disease Sister        Heart disease before age 52   Hypertension Sister    Stroke Maternal Grandfather    Heart attack  Maternal Grandfather    Breast cancer Daughter 2   Asthma Son    Cancer Cousin        dx early 32s; unknown type; paternal female cousin   Neuropathy Neg Hx     Prior to Admission medications   Medication Sig Start Date End Date Taking? Authorizing Provider  acetaminophen  (TYLENOL ) 500 MG tablet Take 500 mg by mouth every 8 (eight) hours as needed for mild pain (pain score 1-3) (or headaches).   Yes [provider]  amLODipine  (NORVASC ) 5 MG tablet Take 2.5 mg by mouth in the morning.   Yes [provider]  atenolol  (TENORMIN ) 25 MG tablet Take 25 mg by mouth at bedtime.   Yes [provider]  Cholecalciferol  (VITAMIN D3) 1000 units CAPS Take 2,000 Units by mouth in the morning. Patient taking differently: Take 1,000 Units by mouth in the morning. 10/11/23  Yes Rai, Ripudeep K, MD  Cyanocobalamin  (B-12) 1000 MCG TABS Take 1,000 mcg by mouth in the morning.   Yes [provider]  diclofenac Sodium (VOLTAREN) 1 % GEL Apply 1 Application topically every 6 (six) hours as needed (for pain).   Yes [provider]  docusate sodium  (COLACE) 100 MG capsule Take 1 capsule (100 mg total) by mouth 2 (two) times daily. 10/11/23  Yes Rai, Ripudeep K, MD  folic acid  (FOLVITE ) 800 MCG tablet Take 800 mcg by mouth at bedtime.   Yes [provider]  hydrALAZINE  (APRESOLINE ) 10 MG tablet Take 1 tablet (10 mg total) by mouth as needed (for blood pressure 150 or above). 08/11/23  Yes Charlanne Fredia CROME, MD  lidocaine  4 % Place 1 patch onto the skin in the morning and at bedtime.   Yes [provider]  meloxicam (MOBIC) 15 MG tablet Take 1 tablet (15 mg total) by mouth daily for 7 days. 10/25/23 11/01/23 Yes Charlanne Fredia CROME, MD  mirtazapine  (REMERON ) 15 MG tablet Take 7.5 mg by mouth at bedtime.   Yes [provider]  ondansetron  (ZOFRAN ) 4 MG tablet Take 4 mg by mouth every 6 (six) hours as needed for nausea or vomiting.   Yes [provider]   oxyCODONE  (OXY IR/ROXICODONE ) 5 MG immediate release tablet Take 1 tablet (5 mg total) by mouth every 8 (eight) hours as needed for severe pain (pain score 7-10). 10/24/23  Yes Fargo, Amy E, NP  pantoprazole  (PROTONIX ) 40 MG tablet Take 1 tablet (40 mg total) by mouth daily. 07/04/23  Yes Charlanne Fredia CROME, MD  polyethylene glycol (MIRALAX  / GLYCOLAX ) 17 g packet Take 17 g by mouth daily. 10/12/23  Yes Rai, Ripudeep K, MD  SYSTANE COMPLETE PF 0.6 % SOLN Place 1 drop into both  eyes 3 (three) times daily as needed (for dryness).   Yes [provider]  magnesium oxide (MAG-OX) 400 (240 Mg) MG tablet Take 400 mg by mouth once. Patient not taking: Reported on 10/25/2023    [provider]  methocarbamol  (ROBAXIN ) 500 MG tablet Take 1 tablet (500 mg total) by mouth 4 (four) times daily. Patient not taking: Reported on 10/25/2023 10/23/23   Charlanne Fredia CROME, MD  sodium chloride  1 g tablet Take 1 g by mouth once. Patient not taking: Reported on 10/25/2023    [provider]    Physical Exam: Vitals:   10/25/23 1110 10/25/23 1202  BP: (!) 152/63   Pulse: 84   Resp: 12   Temp: 97.8 F (36.6 C)   TempSrc: Axillary   SpO2: 98% 100%  Weight: 52.1 kg   Height: 5' 2 (1.575 m)     Constitutional: NAD, calm, comfortable, somnolent after receiving Valium ENMT: Mucous membranes are dry Respiratory: Clear to auscultation bilaterally, no wheezing, no crackles. Normal respiratory effort. No accessory muscle use.  Cardiovascular: Regular rate and rhythm, no murmurs.  Abdomen: Soft, nondistended, nontender to palpation.  Musculoskeletal: Right ankle erythematous and edematous  Labs on Admission: I have personally reviewed following labs and imaging studies  CBC: Recent Labs  Lab 10/25/23 1215  WBC 10.9*  HGB 12.0  HCT 35.7*  MCV 82.3  PLT 437*   Basic Metabolic Panel: Recent Labs  Lab 10/25/23 1215  NA 127*  K 4.4  CL 90*  CO2 24  GLUCOSE 123*  BUN 12  CREATININE 0.46   CALCIUM 8.6*   GFR: Estimated Creatinine Clearance: 37 mL/min (by C-G formula based on SCr of 0.46 mg/dL). Liver Function Tests: No results for input(s): AST, ALT, ALKPHOS, BILITOT, PROT, ALBUMIN in the last 168 hours. No results for input(s): LIPASE, AMYLASE in the last 168 hours. No results for input(s): AMMONIA in the last 168 hours. Coagulation Profile: No results for input(s): INR, PROTIME in the last 168 hours. Cardiac Enzymes: No results for input(s): CKTOTAL, CKMB, CKMBINDEX, TROPONINI in the last 168 hours. BNP (last 3 results) No results for input(s): PROBNP in the last 8760 hours. HbA1C: No results for input(s): HGBA1C in the last 72 hours. CBG: No results for input(s): GLUCAP in the last 168 hours. Lipid Profile: No results for input(s): CHOL, HDL, LDLCALC, TRIG, CHOLHDL, LDLDIRECT in the last 72 hours. Thyroid  Function Tests: No results for input(s): TSH, T4TOTAL, FREET4, T3FREE, THYROIDAB in the last 72 hours. Anemia Panel: No results for input(s): VITAMINB12, FOLATE, FERRITIN, TIBC, IRON, RETICCTPCT in the last 72 hours. Urine analysis:    Component Value Date/Time   COLORURINE YELLOW 10/08/2023 1737   APPEARANCEUR CLEAR 10/08/2023 1737   LABSPEC 1.010 10/08/2023 1737   PHURINE 5.0 10/08/2023 1737   GLUCOSEU NEGATIVE 10/08/2023 1737   HGBUR MODERATE (A) 10/08/2023 1737   BILIRUBINUR NEGATIVE 10/08/2023 1737   KETONESUR 80 (A) 10/08/2023 1737   PROTEINUR NEGATIVE 10/08/2023 1737   UROBILINOGEN 0.2 07/15/2010 1353   NITRITE NEGATIVE 10/08/2023 1737   LEUKOCYTESUR TRACE (A) 10/08/2023 1737   Sepsis Labs: !!!!!!!!!!!!!!!!!!!!!!!!!!!!!!!!!!!!!!!!!!!! @LABRCNTIP (procalcitonin:4,lacticidven:4) )No results found for this or any previous visit (from the past 240 hours).   Radiological Exams on Admission: DG Pelvis 1-2 Views Result Date: 10/25/2023 CLINICAL DATA:  Pelvic fracture EXAM: PELVIS -  1-2 VIEW COMPARISON:  CT pelvis October 25, 2023 FINDINGS: Mildly displaced fractures are identified involving bilateral superior and inferior pubic rami on the right. Remainder of the osseous  structures are normal. No evidence of dislocation of SI joints, hip joints and symphysis pubis . Visualized bowel gas pattern is unremarkable . Amorphous calcifications in left pelvic cavity correlating to known uterine fibroid. Urinary catheter overlying the pelvic cavity. IMPRESSION: Mildly displaced fractures of superior and inferior right pubic rami. Correlate with CT findings. Electronically Signed   By: Megan  Zare M.D.   On: 10/25/2023 13:49   CT PELVIS WO CONTRAST Result Date: 10/25/2023 CLINICAL DATA:  Recent fall with right pubic fracture. Worsening pain upon ambulation. EXAM: CT PELVIS WITHOUT CONTRAST TECHNIQUE: Multidetector CT imaging of the pelvis was performed following the standard protocol without intravenous contrast. RADIATION DOSE REDUCTION: This exam was performed according to the departmental dose-optimization program which includes automated exposure control, adjustment of the mA and/or kV according to patient size and/or use of iterative reconstruction technique. COMPARISON:  Right hip series dated October 08, 2023. FINDINGS: Urinary Tract:  Negative. Bowel: There are few sigmoid diverticula present. The visualized bowel is otherwise unremarkable. Vascular/Lymphatic: Moderate calcific atheromatous disease within the aorta iliac arteries. Reproductive: Small calcified uterine fibroids. The adnexa are unremarkable. Other:  No free fluid or pelvic lymphadenopathy. Musculoskeletal: Comminuted fractures of the right superior and inferior pubic rami. There are also subtle, nondisplaced fractures of the right ilium and sacrum the ilial fracture is evident on image 32 of series 15. The sacral fracture is well demonstrated on image 36. The fractures can also be seen on the coronal reformations, images 94 and 106 of  series 20. The left hemipelvis and the visualized femora are intact. IMPRESSION: 1. In addition to the known right pubic rami fractures, there are also subtle nondisplaced fractures of the right ilium and right hemi sacrum. Electronically Signed   By: Evalene Coho M.D.   On: 10/25/2023 13:48   CT Lumbar Spine Wo Contrast Result Date: 10/25/2023 CLINICAL DATA:  Back trauma, no prior imaging (Age >= 16y) EXAM: CT LUMBAR SPINE WITHOUT CONTRAST TECHNIQUE: Multidetector CT imaging of the lumbar spine was performed without intravenous contrast administration. Multiplanar CT image reconstructions were also generated. RADIATION DOSE REDUCTION: This exam was performed according to the departmental dose-optimization program which includes automated exposure control, adjustment of the mA and/or kV according to patient size and/or use of iterative reconstruction technique. COMPARISON:  None Available. FINDINGS: Segmentation: 5 lumbar type vertebrae. Alignment: Mild sigmoid scoliosis of the thoracolumbar spine, with the lumbar curvature convex to the right. Vertebrae: The lumbar vertebrae are intact. No osseous lesions are present. There are nondisplaced fractures of the right sacrum and ilium present. Paraspinal and other soft tissues: There is moderate calcific atheromatous disease within the aorta iliac arteries. There is no paraspinous soft tissue swelling. Disc levels: There is disc space narrowing at L5-S1. There is no obvious acute disc herniation. There is broad-based disc bulging and bilateral facet hypertrophy at L3-4, causing mild-to-moderate central spinal canal stenosis and bilateral lateral recess stenosis. There is also disc bulging and bilateral facet arthrosis at L4-5, with mild-to-moderate central spinal canal stenosis and bilateral lateral recess stenosis. IMPRESSION: 1. No evidence of acute traumatic injury of the lumbar spine. 2. Thoracolumbar sigmoid scoliosis and multilevel degenerative disc disease  with mild-to-moderate central spinal canal stenosis and bilateral lateral recess stenosis at L3-4 and L4-5. 3. Nondisplaced fractures of the right sacrum and the ileum, which will be more extensively described in the accompanying CT of the pelvis. Electronically Signed   By: Evalene Coho M.D.   On: 10/25/2023 13:32  Assessment/Plan Principal Problem:   Pubic bone fracture (HCC) Active Problems:   Hyponatremia   Hypertension   Ductal carcinoma in situ (DCIS) of left breast   Major depressive disorder, single episode, in full remission (HCC)   Uncontrolled pain secondary to right pubic rami fracture, right ilium fracture, right hemisacrum fracture following mechanical fall - During last hospitalization, Dr. Dozier was consulted who recommended weight-bear as tolerated, PT and pain control. - Continue pain control, muscle relaxer, benzo as needed - Continue PT.  Was recently discharged to skilled nursing facility - Patient with right ankle swelling and erythema.  Recent x-ray done 6/27 was negative for acute fracture.  Check CT.  If negative for fracture, consider cellulitis etiology  Addendum: Spoke with Dr. Marcey Her. He will consult for unstable pelvic fracture.   Acute on chronic hyponatremia - Was thought to be SIADH during last hospitalization, was placed on fluid restriction diet as well as salt tabs. Na on discharge 6/23 was 133. She now seems dehydrated, mucus membrane is dry, pt had poor PO intake over the past several days. Start IVF and monitor   Hypertension -Norvasc , Tenormin   History of breast cancer - Status post lumpectomy in 11/2021.  Followed by Dr. Cloretta   Depression - Remeron    DVT prophylaxis: Lovenox  Code Status: DNR, confirmed with daughter   Family Communication: Daughter  Disposition Plan: Back to SNF  Consults called: None    Severity of Illness: Status is: Inpatient  The patient will require care spanning > 2 midnights and should be  moved to inpatient because: unstable pelvic fracture     Delon Hoe, DO [facility] 10/25/2023, 3:03 PM   Available via Epic secure chat 7am-7pm After these hours, please refer to coverage provider listed on [email or web address]

## 2023-10-25 NOTE — ED Notes (Signed)
 Pt back from CT, sleeping, family at bedside

## 2023-10-25 NOTE — ED Triage Notes (Signed)
 Pt BIBA from [facility] for bilateral leg pain.  Per EMS pt was diagnosed with hip fracture 2 weeks ago.  No tx or surgery.  Pt was ambulatory on fracture for 1 week but then became unable to ambulate due to pain for 1 week.    Vital signs within range and pt is A&O x4

## 2023-10-26 ENCOUNTER — Inpatient Hospital Stay (HOSPITAL_COMMUNITY)

## 2023-10-26 DIAGNOSIS — S32591D Other specified fracture of right pubis, subsequent encounter for fracture with routine healing: Secondary | ICD-10-CM

## 2023-10-26 LAB — BASIC METABOLIC PANEL WITH GFR
Anion gap: 9 (ref 5–15)
BUN: 14 mg/dL (ref 8–23)
CO2: 23 mmol/L (ref 22–32)
Calcium: 8 mg/dL — ABNORMAL LOW (ref 8.9–10.3)
Chloride: 96 mmol/L — ABNORMAL LOW (ref 98–111)
Creatinine, Ser: 0.53 mg/dL (ref 0.44–1.00)
GFR, Estimated: >60 mL/min (ref >60)
Glucose, Bld: 125 mg/dL — ABNORMAL HIGH (ref 70–99)
Potassium: 4.1 mmol/L (ref 3.5–5.1)
Sodium: 128 mmol/L — ABNORMAL LOW (ref 135–145)

## 2023-10-26 LAB — CBC WITH DIFFERENTIAL/PLATELET
Abs Immature Granulocytes: 0.03 10*3/uL (ref 0.00–0.07)
Basophils Absolute: 0 10*3/uL (ref 0.0–0.1)
Basophils Relative: 1 %
Eosinophils Absolute: 0.1 10*3/uL (ref 0.0–0.5)
Eosinophils Relative: 1 %
HCT: 30.9 % — ABNORMAL LOW (ref 36.0–46.0)
Hemoglobin: 10.3 g/dL — ABNORMAL LOW (ref 12.0–15.0)
Immature Granulocytes: 0 %
Lymphocytes Relative: 13 %
Lymphs Abs: 0.9 10*3/uL (ref 0.7–4.0)
MCH: 28 pg (ref 26.0–34.0)
MCHC: 33.3 g/dL (ref 30.0–36.0)
MCV: 84 fL (ref 80.0–100.0)
Monocytes Absolute: 0.5 10*3/uL (ref 0.1–1.0)
Monocytes Relative: 7 %
Neutro Abs: 5.6 10*3/uL (ref 1.7–7.7)
Neutrophils Relative %: 78 %
Platelets: 394 10*3/uL (ref 150–400)
RBC: 3.68 MIL/uL — ABNORMAL LOW (ref 3.87–5.11)
RDW: 13.2 % (ref 11.5–15.5)
WBC: 7.2 10*3/uL (ref 4.0–10.5)
nRBC: 0 % (ref 0.0–0.2)

## 2023-10-26 LAB — TROPONIN I (HIGH SENSITIVITY): Troponin I (High Sensitivity): 6 ng/L (ref <18)

## 2023-10-26 NOTE — Evaluation (Signed)
 Occupational Therapy Evaluation Patient Details Name: Tammy Lindsey MRN: [id number] DOB: Apr 04, 1932 Today's Date: 10/26/2023   History of Present Illness   89 y.o. female with PMH of breast cancer, depression, hypertension, neuropathy.  Patient was recently hospitalized 6/15 to 6/19 after presenting with mechanical fall.  During that hospitalization, she was found to have pubic bone fracture.  Orthopedic surgery was consulted at that time, recommended WBAT.  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  Discharged to [facility] SNF on 6/19.  She returned to the ED on 6/27 with right foot pain and swelling.  X-ray was done which was unremarkable other than soft tissue swelling.  She was placed in a postop shoe and discharged back to SNF.  pt returned with reports of worsening pain.  Patient was having severe muscle spasms in her thighs.  SNF physician increased her pain regimen with oxycodone  and Robaxin , without relief.  Due uncontrolled pain, patient was having decreased p.o. intake and appetite. Ortho MD recommends NWB RLE and chair to bed transfers only.     Clinical Impressions Pt admitted with the diagnosis. Pt currently with functional limitations due to the deficits listed below (see OT Problem List). Prior to admit, pt was receiving skilled therapy services at [facility]. Plan is to return once medically stable. Pt will benefit from acute skilled OT to increase their safety and independence with ADL and functional mobility for ADL to facilitate discharge. Patient will benefit from continued inpatient follow up therapy, <3 hours/day.       If plan is discharge home, recommend the following:   Two people to help with walking and/or transfers;Two people to help with bathing/dressing/bathroom     Functional Status Assessment   Patient has had a recent decline in their functional status and demonstrates the ability to make significant improvements in  function in a reasonable and predictable amount of time.     Equipment Recommendations   Other (comment) (defer to next venue of care)      Precautions/Restrictions   Precautions Precautions: Fall Recall of Precautions/Restrictions: Impaired Restrictions Weight Bearing Restrictions Per Provider Order: Yes RLE Weight Bearing Per Provider Order: Non weight bearing     Mobility Bed Mobility Overal bed mobility: Needs Assistance Bed Mobility: Supine to Sit, Sit to Supine     Supine to sit: Max assist, HOB elevated, Used rails Sit to supine: Max assist   General bed mobility comments: Bed pads and trendelenburg postitioning utilized to assist with boosting up in bed. Pt required increased time to transition to sitting on EOB with assist provided to management BLE both on/off bed and to bring trunk off HOB.    Transfers  General transfer comment: Deferred at this time.      Balance Overall balance assessment: Needs assistance Sitting-balance support: No upper extremity supported, Bilateral upper extremity supported Sitting balance-Leahy Scale: Fair Sitting balance - Comments: sitting EOB        ADL either performed or assessed with clinical judgement   ADL Overall ADL's : Needs assistance/impaired Eating/Feeding: Set up;Bed level   Grooming: Set up;Bed level   Upper Body Bathing: Set up;Sitting   Lower Body Bathing: Total assistance;Bed level   Upper Body Dressing : Set up;Sitting   Lower Body Dressing: Total assistance;Bed level     Toilet Transfer Details (indicate cue type and reason): Not completed during evaluation. Recommend 2 person stand pivot transfer d/t NWB RLE. Toileting- Clothing Manipulation and Hygiene: Total assistance;Bed  level               Vision Baseline Vision/History: 0 No visual deficits Ability to See in Adequate Light: 0 Adequate Patient Visual Report: No change from baseline Vision Assessment?: No apparent visual deficits      Perception Perception: Not tested       Praxis Praxis: Not tested       Pertinent Vitals/Pain Pain Assessment Pain Assessment: Faces (Pt reports no pain at rest) Faces Pain Scale: Hurts whole lot Pain Location: bilateral pelvis with movement and mobility Pain Descriptors / Indicators: Grimacing, Discomfort Pain Intervention(s): Patient requesting pain meds-RN notified, Repositioned, Monitored during session     Extremity/Trunk Assessment Upper Extremity Assessment Upper Extremity Assessment: Right hand dominant;LUE deficits/detail RUE Deficits / Details: A/ROM WFL in all ranges. MMT: 4/5 shoulder, elbow ranges. functional gross grasp. LUE Deficits / Details: Hx of RTC repair with limited shoulder ROM. Pt able to demonstrate A/ROM shoulder flexion/abduction to shoulder level. Able to reach back of head with assistance from RUE for placement. 4/5 elbow flexion/extension, functional gross grasp.   Lower Extremity Assessment Lower Extremity Assessment: Defer to PT evaluation   Cervical / Trunk Assessment Cervical / Trunk Assessment: Normal   Communication Communication Communication: Impaired Factors Affecting Communication: Hearing impaired   Cognition Arousal: Alert Behavior During Therapy: Flat affect Cognition: No apparent impairments   Following commands: Intact       Cueing  General Comments   Cueing Techniques: Verbal cues  VSS on RA   Exercises General Exercises - Lower Extremity Ankle Circles/Pumps: AROM, Both, 10 reps, Supine, Seated Gluteal Sets: AROM, Both, 10 reps, Supine Heel Slides: AROM, Both, 10 reps, Supine Other Exercises Other Exercises: seated, A/ROM knee flexion/extension BLE, 10X, 1 set.        Home Living Family/patient expects to be discharged to:: Skilled nursing facility            OT Problem List: Impaired balance (sitting and/or standing);Decreased activity tolerance;Decreased strength;Pain   OT Treatment/Interventions:  Self-care/ADL training;Therapeutic activities;Therapeutic exercise;Patient/family education;Balance training;DME and/or AE instruction      OT Goals(Current goals can be found in the care plan section)   Acute Rehab OT Goals Patient Stated Goal: none stated OT Goal Formulation: With patient Time For Goal Achievement: 11/09/23 Potential to Achieve Goals: Fair   OT Frequency:  Min 1X/week       AM-PAC OT 6 Clicks Daily Activity     Outcome Measure Help from another person eating meals?: A Little Help from another person taking care of personal grooming?: A Little Help from another person toileting, which includes using toliet, bedpan, or urinal?: Total Help from another person bathing (including washing, rinsing, drying)?: A Lot Help from another person to put on and taking off regular upper body clothing?: A Little Help from another person to put on and taking off regular lower body clothing?: Total 6 Click Score: 13   End of Session Nurse Communication: Patient requests pain meds  Activity Tolerance: Patient limited by pain Patient left: in bed;with call bell/phone within reach;with bed alarm set;with family/visitor present  OT Visit Diagnosis: Muscle weakness (generalized) (M62.81);History of falling (Z91.81);Pain Pain - Right/Left: Right Pain - part of body: Leg;Hip                Time: 1001-1040 OT Time Calculation (min): 39 min Charges:  OT General Charges $OT Visit: 1 Visit OT Evaluation $OT Eval Moderate Complexity: 1 Mod OT Treatments $Self Care/Home Management : 8-22 mins $Therapeutic  Exercise: 8-22 mins  Leita Howell, OTR/L,CBIS  Supplemental OT - [facility] and [facility] Secure Chat Preferred    Lebron Nauert, Leita BIRCH 10/26/2023, 11:18 AM

## 2023-10-26 NOTE — Hospital Course (Addendum)
 89yo with h/o breast CA, HTN, depression, and neuropathy who was hospitalized from 6/15-19 with a mechanical fall and discharged to SNF rehab.  She returned on 7/2 with ongoing intractable pain associated with pelvic fracture and repeat imaging showed R ilium and hemi-sacrum fractures.  She also has SIADH and was started on salt tablets during her last hospitalization but Na++ was again low, normalizing.  Dr. Kendal is planning closed reduction of fractures on 7/7 and so patient will transfer from [facility] to [facility].

## 2023-10-26 NOTE — Progress Notes (Signed)
 Progress Note   Patient: Tammy Lindsey FMW:[id number] DOB: 03-25-1932 DOA: 10/25/2023     1 DOS: the patient was seen and examined on 10/26/2023   Brief hospital course: 89yo with h/o breast CA, HTN, depression, and neuropathy who was hospitalized from 6/15-19 with a mechanical fall and discharged to SNF rehab.  She returned on 7/2 with ongoing intractable pain associated with pelvic fracture and repeat imaging showed R ilium and hemi-sacrum fractures.  She also has SIADH and was started on salt tablets during her last hospitalization but Na++ remains low.  Assessment and Plan:  Uncontrolled pain secondary to right pubic rami fracture, right ilium fracture, right hemisacrum fracture following mechanical fall During last hospitalization, Dr. Dozier was consulted who recommended weight-bearing as tolerated, PT, and pain control She has been at [facility] rehab but the pain has been intolerable over the last few days - primarily upper leg spasms Continue pain control, muscle relaxer, benzo as needed Continue PT Orthopedics consulting (Dr. Kay) - prefer non-surgical intervention but there are options if she truly cannot bear gentle mobilization (would happen  early next week, if needed)  Ankle fractures Foot xray on 6/27 was negative for acute fracture CT ordered and showed acute nondisplaced fracture of fibular cortex as well as possible fracture(s) of talus Orthopedics consulted and he reports that this basically amount(s) to an ankle sprain Encouraged to do ROM activities and edema control measures rather than casting or splinting Given multiple fractures, osteoporosis is of significant concern and she should be on bisphosphonate therapy if she has not taken in the past (patient and daughter are unsure); will encourage PCP f/u   Acute on chronic hyponatremia Was thought to be SIADH during last hospitalization, was placed on fluid restriction diet as well as salt tabs Na++ on discharge 6/23 was  133 Na++ is now 127 -> 128  Likely exacerbated by prerenal azotemia in the setting of poor PO intake over the past several days Start IVF and monitor    Hypertension Continue amlodipine , atenolol     History of breast cancer Status post lumpectomy in 11/2021   Followed by Dr. Cloretta    Depression Continue Remeron      Consultants: Orthopedics  Procedures: None  Antibiotics: None  30 Day Unplanned Readmission Risk Score    Flowsheet Row ED to Hosp-Admission (Current) from 10/25/2023 in [facility]  30 Day Unplanned Readmission Risk Score (%) 16.26 Filed at 10/26/2023 0801    This score is the patient's risk of an unplanned readmission within 30 days of being discharged (0 -100%). The score is based on dignosis, age, lab data, medications, orders, and past utilization.   Low:  0-14.9   Medium: 15-21.9   High: 22-29.9   Extreme: 30 and above           Subjective: Pain is much better controlled today, she was able to sit up on the side of the bed.  Significant R ankle edema even visible through Prevalon boot.   Objective: Vitals:   10/26/23 1057 10/26/23 1136  BP: (!) 160/74 108/60  Pulse:  74  Resp:    Temp:  97.8 F (36.6 C)  SpO2:  97%    Intake/Output Summary (Last 24 hours) at 10/26/2023 1651 Last data filed at 10/26/2023 0900 Gross per 24 hour  Intake 282.43 ml  Output 250 ml  Net 32.43 ml   Filed Weights   10/25/23 1110  Weight: 52.1 kg    Exam:  General:  Appears calm and comfortable and is in NAD Eyes:  normal lids, iris ENT:  grossly normal hearing, lips & tongue, mmm Cardiovascular:  RRR, no m/r/g Respiratory:   CTA bilaterally with no wheezes/rales/rhonchi.  Normal respiratory effort. Abdomen:  soft, NT, ND Skin:  no rash or induration seen on limited exam Musculoskeletal:  R ankle edema; BLE in Prevalon boots Psychiatric:  blunted mood and affect, speech sparse but appropriate Neurologic:  CN  2-12 grossly intact, moves all extremities in coordinated fashion  Data Reviewed: I have reviewed the patient's lab results since admission.  Pertinent labs for today include:   Na++ 128, up from 127; 133 on 6/23 Glucose 125 WBC 7.2 Hgb 10.2 UA; small Hgb, 5 ketones    Family Communication: Daughter was present throughout evaluation  Disposition: Status is: Inpatient Remains inpatient appropriate because: ongoing management     Time spent: 50 minutes  Unresulted Labs (From admission, onward)     Start     Ordered   10/27/23 0500  Basic metabolic panel with GFR  Tomorrow morning,   R        10/26/23 1651   10/27/23 0500  CBC with Differential/Platelet  Tomorrow morning,   R        10/26/23 1651             Author: Delon Herald, MD 10/26/2023 4:51 PM  For on call review [email or web address].

## 2023-10-26 NOTE — Evaluation (Signed)
 Physical Therapy Evaluation Patient Details Name: Tammy Lindsey MRN: [id number] DOB: 03-11-1932 Today's Date: 10/26/2023  History of Present Illness  89 y.o. female with PMH of breast cancer, depression, hypertension, neuropathy.  Patient was recently hospitalized 6/15 to 6/19 after presenting with mechanical fall.  During that hospitalization, she was found to have pubic bone fracture.  Orthopedic surgery was consulted at that time, recommended WBAT.  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  Discharged to [facility] SNF on 6/19.  She returned to the ED on 6/27 with right foot pain and swelling.  X-ray was done which was unremarkable other than soft tissue swelling.  She was placed in a postop shoe and discharged back to SNF.  pt returned with reports of worsening pain.  Patient was having severe muscle spasms in her thighs.  SNF physician increased her pain regimen with oxycodone  and Robaxin , without relief.  Due uncontrolled pain, patient was having decreased p.o. intake and appetite. Ortho MD recommends NWB RLE and chair to bed transfers only.  Clinical Impression  Pt admitted with above diagnosis. Pt agreeable to session and completing what she is able to tolerate for mobility today. Educated on rolling and coming to sit EOB, requires mod A for trunk and LE management. Pt is able to initiate movement with BLE. Maintains sitting balance and completes light marching sitting EOB x10 BLE. Returns to supine with increased assist required for LE management. Pt denies pain following and all needs met. Based on pt PLOF, level of support, and current functional status, pt will benefit from skilled inpatient physical therapy <3 hours per day at dc for safe progression of functional mobility. Pt currently with functional limitations due to the deficits listed below (see PT Problem List). Pt will benefit from acute skilled PT to increase their independence and safety with  mobility to allow discharge.           If plan is discharge home, recommend the following: A lot of help with walking and/or transfers;A lot of help with bathing/dressing/bathroom;Two people to help with bathing/dressing/bathroom;Two people to help with walking and/or transfers;Assistance with cooking/housework   Can travel by private vehicle   No    Equipment Recommendations None recommended by PT  Recommendations for Other Services       Functional Status Assessment Patient has had a recent decline in their functional status and demonstrates the ability to make significant improvements in function in a reasonable and predictable amount of time.     Precautions / Restrictions Precautions Precautions: Fall Recall of Precautions/Restrictions: Impaired Precaution/Restrictions Comments: frequent cues to maintain NWB Restrictions Weight Bearing Restrictions Per Provider Order: Yes RLE Weight Bearing Per Provider Order: Non weight bearing LLE Weight Bearing Per Provider Order: Touchdown weight bearing      Mobility  Bed Mobility Overal bed mobility: Needs Assistance Bed Mobility: Sit to Supine, Rolling Rolling: Mod assist     Sit to supine: Max assist   General bed mobility comments: LE management sit to supine, trunk and LE assisted sidelying to sit. Attempted to initiate standing on LLE only, pt requires frequent cues for NWB RLE, unable to progress beyond small clearance from EOB on LLE with max A    Transfers                        Ambulation/Gait  Stairs            Wheelchair Mobility     Tilt Bed    Modified Rankin (Stroke Patients Only)       Balance Overall balance assessment: Needs assistance Sitting-balance support: No upper extremity supported, Bilateral upper extremity supported Sitting balance-Leahy Scale: Good Sitting balance - Comments: sitting EOB                                      Pertinent Vitals/Pain Pain Assessment Pain Assessment: Faces Faces Pain Scale: Hurts whole lot Pain Location: bilateral pelvis with movement and mobility Pain Descriptors / Indicators: Grimacing, Discomfort, Aching, Sharp Pain Intervention(s): Limited activity within patient's tolerance, Monitored during session, Repositioned    Home Living Family/patient expects to be discharged to:: Skilled nursing facility Living Arrangements: Alone;Other (Comment) Available Help at Discharge: Family;Available PRN/intermittently Type of Home: Independent living facility Home Access: Level entry       Home Layout: One level Home Equipment: Agricultural consultant (2 wheels);Shower seat;Grab bars - tub/shower;Rollator (4 wheels)      Prior Function Prior Level of Function : Independent/Modified Independent;Driving             Mobility Comments: ambulates with RW; can ambulate in community ADLs Comments: independent adls and iadls; does her own driving and shopping     Extremity/Trunk Assessment   Upper Extremity Assessment Upper Extremity Assessment: Defer to OT evaluation RUE Deficits / Details: A/ROM WFL in all ranges. MMT: 4/5 shoulder, elbow ranges. functional gross grasp. LUE Deficits / Details: Hx of RTC repair with limited shoulder ROM. Pt able to demonstrate A/ROM shoulder flexion/abduction to shoulder level. Able to reach back of head with assistance from RUE for placement. 4/5 elbow flexion/extension, functional gross grasp.    Lower Extremity Assessment Lower Extremity Assessment: Generalized weakness RLE Deficits / Details: Pt able to initiate LE movements, limited by pain in B pelvis    Cervical / Trunk Assessment Cervical / Trunk Assessment: Normal  Communication   Communication Communication: Impaired Factors Affecting Communication: Hearing impaired    Cognition Arousal: Alert Behavior During Therapy: Flat affect   PT - Cognitive impairments: Memory                        PT - Cognition Comments: Pt defers home setup to dtr Following commands: Intact       Cueing Cueing Techniques: Verbal cues, Gestural cues     General Comments General comments (skin integrity, edema, etc.): VSS on RA    Exercises     Assessment/Plan    PT Assessment Patient needs continued PT services  PT Problem List Decreased strength;Decreased range of motion;Decreased activity tolerance;Decreased knowledge of use of DME;Decreased balance;Decreased mobility       PT Treatment Interventions DME instruction;Therapeutic exercise;Gait training;Functional mobility training;Therapeutic activities;Patient/family education;Balance training;Modalities    PT Goals (Current goals can be found in the Care Plan section)  Acute Rehab PT Goals Patient Stated Goal: return to [facility], decrease pain PT Goal Formulation: With patient Time For Goal Achievement: 10/23/23 Potential to Achieve Goals: Good    Frequency Min 2X/week     Co-evaluation               AM-PAC PT 6 Clicks Mobility  Outcome Measure Help needed turning from your back to your side while in a flat bed without using bedrails?: A Lot Help needed  moving from lying on your back to sitting on the side of a flat bed without using bedrails?: A Lot Help needed moving to and from a bed to a chair (including a wheelchair)?: Total Help needed standing up from a chair using your arms (e.g., wheelchair or bedside chair)?: Total Help needed to walk in hospital room?: Total Help needed climbing 3-5 steps with a railing? : Total 6 Click Score: 8    End of Session Equipment Utilized During Treatment: Gait belt Activity Tolerance: Patient tolerated treatment well Patient left: in bed;with call bell/phone within reach;with bed alarm set Nurse Communication: Mobility status PT Visit Diagnosis: Other abnormalities of gait and mobility (R26.89);Muscle weakness (generalized) (M62.81);Pain Pain - part of  body: Hip    Time: 8580-8556 PT Time Calculation (min) (ACUTE ONLY): 24 min   Charges:   PT Evaluation $PT Eval Low Complexity: 1 Low PT Treatments $Therapeutic Activity: 8-22 mins PT General Charges $$ ACUTE PT VISIT: 1 Visit         Stann, PT Acute Rehabilitation Services Office: [phone] 10/26/2023   Stann DELENA Ohara 10/26/2023, 3:06 PM

## 2023-10-26 NOTE — Progress Notes (Signed)
 Orthopedics Progress Note  Subjective: Patient feeling much better today. Pelvis and ankle pain better controlled. Patient was able to sit up in bed and dangle her legs with therapy.   Objective:  Vitals:   10/26/23 1057 10/26/23 1136  BP: (!) 160/74 108/60  Pulse:  74  Resp:    Temp:  97.8 F (36.6 C)  SpO2:  97%    General: Awake and alert  Musculoskeletal: Patient resting in bed. Improved active ROM of the ankle.  Neurovascularly intact  Lab Results  Component Value Date   WBC 7.2 10/26/2023   HGB 10.3 (L) 10/26/2023   HCT 30.9 (L) 10/26/2023   MCV 84.0 10/26/2023   PLT 394 10/26/2023       Component Value Date/Time   NA 127 (L) 10/25/2023 1215   NA 133 (A) 10/16/2023 0000   K 4.4 10/25/2023 1215   CL 90 (L) 10/25/2023 1215   CO2 24 10/25/2023 1215   GLUCOSE 123 (H) 10/25/2023 1215   BUN 12 10/25/2023 1215   BUN 15 10/16/2023 0000   CREATININE 0.46 10/25/2023 1215   CREATININE 0.68 11/24/2021 0818   CALCIUM 8.6 (L) 10/25/2023 1215   GFRNONAA >60 10/25/2023 1215   GFRNONAA >60 11/24/2021 0818   GFRAA  07/24/2010 0600    >60        The eGFR has been calculated using the MDRD equation. This calculation has not been validated in all clinical situations. eGFR's persistently <60 mL/min signify possible Chronic Kidney Disease.    Lab Results  Component Value Date   INR 1.1 10/08/2023   INR 0.97 07/15/2010    Assessment/Plan: Pelvic ring injury from fall (lateral compression variant)  Discussed with Dr Kendal who recommended a trial of conservative management in this 89 yo to see how she does with gentle mobilization bed to chair. If she continues to have high level pain preventing bed to chair transfers and basic care and mobility then he would recommend SI joint reduction/pelvis reduction and stabilization with percutaneous screws. I discussed this with the daughter and the patient. As well as she is doing currently we agreed to continue therapy (Strict  NWB on the right LE) through the weekend and make a call at that point. If surgery is needed, would be next Monday at the earliest.  Continue medical optimization and electrolyte replentishment.   Right ankle CT scan show several little things which basically amount to an ankle sprain. Would avoid cast or splint immobilization over skin concerns. Would do active ROM for the ankle and edema control measures with elevation and also decub precautions.   The daughter and patient agree with this conservative plan and will reach out to me with updates in case we need to get Dr Kendal involved and discuss surgery for the pelvis.  Elspeth SAUNDERS. Kay, MD (Cell ) [phone] 10/26/2023 12:54 PM

## 2023-10-27 DIAGNOSIS — S32591D Other specified fracture of right pubis, subsequent encounter for fracture with routine healing: Secondary | ICD-10-CM | POA: Diagnosis not present

## 2023-10-27 LAB — CBC WITH DIFFERENTIAL/PLATELET
Abs Immature Granulocytes: 0.04 K/uL (ref 0.00–0.07)
Basophils Absolute: 0.1 K/uL (ref 0.0–0.1)
Basophils Relative: 1 %
Eosinophils Absolute: 0.2 K/uL (ref 0.0–0.5)
Eosinophils Relative: 2 %
HCT: 35.3 % — ABNORMAL LOW (ref 36.0–46.0)
Hemoglobin: 11.6 g/dL — ABNORMAL LOW (ref 12.0–15.0)
Immature Granulocytes: 1 %
Lymphocytes Relative: 13 %
Lymphs Abs: 1 K/uL (ref 0.7–4.0)
MCH: 27.2 pg (ref 26.0–34.0)
MCHC: 32.9 g/dL (ref 30.0–36.0)
MCV: 82.7 fL (ref 80.0–100.0)
Monocytes Absolute: 0.6 K/uL (ref 0.1–1.0)
Monocytes Relative: 8 %
Neutro Abs: 5.7 K/uL (ref 1.7–7.7)
Neutrophils Relative %: 75 %
Platelets: 446 K/uL — ABNORMAL HIGH (ref 150–400)
RBC: 4.27 MIL/uL (ref 3.87–5.11)
RDW: 13.2 % (ref 11.5–15.5)
WBC: 7.6 K/uL (ref 4.0–10.5)
nRBC: 0 % (ref 0.0–0.2)

## 2023-10-27 LAB — BASIC METABOLIC PANEL WITH GFR
Anion gap: 12 (ref 5–15)
BUN: 11 mg/dL (ref 8–23)
CO2: 23 mmol/L (ref 22–32)
Calcium: 8.7 mg/dL — ABNORMAL LOW (ref 8.9–10.3)
Chloride: 95 mmol/L — ABNORMAL LOW (ref 98–111)
Creatinine, Ser: 0.54 mg/dL (ref 0.44–1.00)
GFR, Estimated: >60 mL/min (ref >60)
Glucose, Bld: 110 mg/dL — ABNORMAL HIGH (ref 70–99)
Potassium: 3.9 mmol/L (ref 3.5–5.1)
Sodium: 130 mmol/L — ABNORMAL LOW (ref 135–145)

## 2023-10-27 LAB — CK: Total CK: 25 U/L — ABNORMAL LOW (ref 38–234)

## 2023-10-27 MED ORDER — NITROGLYCERIN 0.4 MG SL SUBL
SUBLINGUAL_TABLET | SUBLINGUAL | Status: AC
  Administered 2023-10-27: 0.4 mg via SUBLINGUAL
  Filled 2023-10-27: qty 1

## 2023-10-27 MED ORDER — LIDOCAINE 5 % EX PTCH
2.0000 | MEDICATED_PATCH | CUTANEOUS | Status: DC
  Administered 2023-10-27 – 2023-11-03 (×8): 2 via TRANSDERMAL
  Filled 2023-10-27 (×8): qty 2

## 2023-10-27 MED ORDER — OXYCODONE HCL 5 MG PO TABS
5.0000 mg | ORAL_TABLET | ORAL | Status: DC | PRN
  Administered 2023-10-27 – 2023-10-28 (×4): 5 mg via ORAL
  Filled 2023-10-27 (×4): qty 1

## 2023-10-27 MED ORDER — NITROGLYCERIN 0.4 MG SL SUBL
0.4000 mg | SUBLINGUAL_TABLET | SUBLINGUAL | Status: DC | PRN
  Administered 2023-10-27: 0.4 mg via SUBLINGUAL
  Filled 2023-10-27: qty 1

## 2023-10-27 MED ORDER — HYDROMORPHONE HCL 1 MG/ML IJ SOLN
0.5000 mg | INTRAMUSCULAR | Status: DC | PRN
  Administered 2023-10-27 – 2023-11-02 (×6): 0.5 mg via INTRAVENOUS
  Filled 2023-10-27 (×6): qty 0.5

## 2023-10-27 NOTE — Progress Notes (Addendum)
 Progress Note   Patient: Tammy Lindsey FMW:[id number] DOB: October 20, 1931 DOA: 10/25/2023     2 DOS: the patient was seen and examined on 10/27/2023   Brief hospital course: 89yo with h/o breast CA, HTN, depression, and neuropathy who was hospitalized from 6/15-19 with a mechanical fall and discharged to SNF rehab.  She returned on 7/2 with ongoing intractable pain associated with pelvic fracture and repeat imaging showed R ilium and hemi-sacrum fractures.  She also has SIADH and was started on salt tablets during her last hospitalization but Na++ remains low.  Assessment and Plan:  Uncontrolled pain secondary to right pubic rami fracture, right ilium fracture, right hemisacrum fracture following mechanical fall During last hospitalization, Tammy Lindsey was consulted who recommended weight-bearing as tolerated, PT, and pain control She has been at [facility] rehab but the pain has been intolerable over the last few days - primarily upper leg spasms Continue pain control, muscle relaxer, benzo as needed - meds adjusted this AM with Oxy/Dilaudid /Lidocaine  plus Valium  and Robaxin  Will check CK level Continue PT Orthopedics consulting (Tammy Lindsey) - prefer non-surgical intervention but there are options if she truly cannot bear gentle mobilization (would happen  early next week, if needed)   Ankle fractures Foot xray on 6/27 was negative for acute fracture CT ordered and showed acute nondisplaced fracture of fibular cortex as well as possible fracture(s) of talus Orthopedics consulted and he reports that this basically amount(s) to an ankle sprain Encouraged to do ROM activities and edema control measures rather than casting or splinting Given multiple fractures, osteoporosis is of significant concern and she should be on bisphosphonate therapy if she has not taken in the past (patient and daughter are unsure); will encourage PCP f/u   Acute on chronic hyponatremia Was thought to be SIADH during last  hospitalization, was placed on fluid restriction diet as well as salt tabs Na++ on discharge 6/23 was 133 Na++ is now 127 -> 128 -> 130 Likely exacerbated by prerenal azotemia in the setting of poor PO intake over the past several days Improving, near baseline   Hypertension Continue amlodipine , atenolol   EKG pending but chest discomfort has resolved and BP is improved   History of breast cancer Status post lumpectomy in 11/2021   Followed by Tammy Lindsey    Depression Continue Remeron          Consultants: Orthopedics   Procedures: None   Antibiotics: None  30 Day Unplanned Readmission Risk Score    Flowsheet Row ED to Hosp-Admission (Current) from 10/25/2023 in [facility]  30 Day Unplanned Readmission Risk Score (%) 17.87 Filed at 10/27/2023 0801    This score is the patient's risk of an unplanned readmission within 30 days of being discharged (0 -100%). The score is based on dignosis, age, lab data, medications, orders, and past utilization.   Low:  0-14.9   Medium: 15-21.9   High: 22-29.9   Extreme: 30 and above           Subjective: Complaining of hip and muscle pain overnight and into this morning. Also complained of chest discomfort, BP significantly elevated.  By the time I saw her, however, her BP was controlled and she did not complain of any pain.   Objective: Vitals:   10/27/23 1004 10/27/23 1341  BP: (!) 158/80 (!) 145/70  Pulse: 77 84  Resp:  16  Temp:  (!) 97.5 F (36.4 C)  SpO2: 100% 99%    Intake/Output Summary (  Last 24 hours) at 10/27/2023 1441 Last data filed at 10/27/2023 0641 Gross per 24 hour  Intake 120 ml  Output 1100 ml  Net -980 ml   Filed Weights   10/25/23 1110  Weight: 52.1 kg    Exam:  General:  Appears calm and comfortable and is in NAD Eyes:  normal lids, iris ENT:  grossly normal hearing, lips & tongue, mmm Cardiovascular:  RRR, no m/r/g Respiratory:   CTA bilaterally with no  wheezes/rales/rhonchi.  Normal respiratory effort. Abdomen:  soft, NT, ND Skin:  no rash or induration seen on limited exam Musculoskeletal:  R ankle edema; BLE in Prevalon boots Psychiatric:  blunted mood and affect, speech sparse but appropriate Neurologic:  CN 2-12 grossly intact, moves all extremities in coordinated fashion  Data Reviewed: I have reviewed the patient's lab results since admission.  Pertinent labs for today include:   Na++ 130, improving Glucose 110 Stable CBC     Family Communication: None present  Disposition: Status is: Inpatient Remains inpatient appropriate because: ongoing management     Time spent: 50 minutes  Unresulted Labs (From admission, onward)     Start     Ordered   10/28/23 0500  Basic metabolic panel with GFR  Tomorrow morning,   R        10/27/23 1441             Author: Delon Herald, MD 10/27/2023 2:41 PM  For on call review [email or web address].

## 2023-10-27 NOTE — Plan of Care (Signed)

## 2023-10-28 DIAGNOSIS — S32591D Other specified fracture of right pubis, subsequent encounter for fracture with routine healing: Secondary | ICD-10-CM | POA: Diagnosis not present

## 2023-10-28 LAB — BASIC METABOLIC PANEL WITH GFR
Anion gap: 12 (ref 5–15)
BUN: 10 mg/dL (ref 8–23)
CO2: 23 mmol/L (ref 22–32)
Calcium: 8.9 mg/dL (ref 8.9–10.3)
Chloride: 96 mmol/L — ABNORMAL LOW (ref 98–111)
Creatinine, Ser: 0.53 mg/dL (ref 0.44–1.00)
GFR, Estimated: >60 mL/min (ref >60)
Glucose, Bld: 102 mg/dL — ABNORMAL HIGH (ref 70–99)
Potassium: 4.3 mmol/L (ref 3.5–5.1)
Sodium: 131 mmol/L — ABNORMAL LOW (ref 135–145)

## 2023-10-28 MED ORDER — DIAZEPAM 5 MG/ML IJ SOLN
2.5000 mg | Freq: Four times a day (QID) | INTRAMUSCULAR | Status: DC
  Administered 2023-10-28: 2.5 mg via INTRAVENOUS
  Filled 2023-10-28 (×2): qty 2

## 2023-10-28 MED ORDER — OXYCODONE HCL 5 MG PO TABS
5.0000 mg | ORAL_TABLET | ORAL | Status: DC
  Administered 2023-10-28 – 2023-10-29 (×10): 5 mg via ORAL
  Filled 2023-10-28 (×10): qty 1

## 2023-10-28 MED ORDER — METHOCARBAMOL 1000 MG/10ML IJ SOLN: 500.0000 mg | Freq: Four times a day (QID) | INTRAMUSCULAR | Status: DC

## 2023-10-28 MED ORDER — DIAZEPAM 5 MG/ML IJ SOLN: 2.5000 mg | Freq: Four times a day (QID) | INTRAMUSCULAR | Status: DC

## 2023-10-28 MED ORDER — DIAZEPAM 5 MG/ML IJ SOLN
2.5000 mg | Freq: Four times a day (QID) | INTRAMUSCULAR | Status: DC
  Administered 2023-10-29 – 2023-11-01 (×13): 2.5 mg via INTRAVENOUS
  Filled 2023-10-28 (×13): qty 2

## 2023-10-28 MED ORDER — METHOCARBAMOL 1000 MG/10ML IJ SOLN
500.0000 mg | Freq: Four times a day (QID) | INTRAMUSCULAR | Status: DC
  Administered 2023-10-28 – 2023-11-01 (×16): 500 mg via INTRAVENOUS
  Filled 2023-10-28 (×16): qty 10

## 2023-10-28 NOTE — Progress Notes (Signed)
 PT Cancellation Note  Patient Details Name: KADINCE BOXLEY MRN: [id number] DOB: [birth year]   Cancelled Treatment:    Reason Eval/Treat Not Completed: Other (comment) pt to transfer to [facility], possible surgery next wk  Rexene, PT  Acute Rehab Dept [facility]) [phone]  10/28/2023    [facility] 10/28/2023, 9:21 AM

## 2023-10-28 NOTE — Progress Notes (Signed)
 Received patient via [facility].  Prior to leaving the room patient stated how long do I  have to stay in the cubby hole  Reoriented and seem to settle down.  But when I went back patient was crying.  Asking for her family to come get her because she was not having surgery and not staying here.  Reassured patient

## 2023-10-28 NOTE — Progress Notes (Signed)
 Progress Note   Patient: Tammy Lindsey FMW:[id number] DOB: 06/21/1931 DOA: 10/25/2023     3 DOS: the patient was seen and examined on 10/28/2023   Brief hospital course: 89yo with h/o breast CA, HTN, depression, and neuropathy who was hospitalized from 6/15-19 with a mechanical fall and discharged to SNF rehab.  She returned on 7/2 with ongoing intractable pain associated with pelvic fracture and repeat imaging showed R ilium and hemi-sacrum fractures.  She also has SIADH and was started on salt tablets during her last hospitalization but Na++ was again low, normalizing.  Dr. Kendal is planning closed reduction of fractures on 7/7 and so patient will transfer from [facility] to [facility].  Assessment and Plan:  Uncontrolled pain secondary to right pubic rami fracture, right ilium fracture, right hemisacrum fracture following mechanical fall During last hospitalization, Dr. Dozier was consulted who recommended weight-bearing as tolerated, PT, and pain control She has been at [facility] rehab but the pain has been intolerable over the last few days - primarily upper leg spasms Meds adjusted on 7/4 with Oxy/Dilaudid /Lidocaine  plus Valium  and Robaxin  She appeared to be comfortable but worsened overnight again and is miserable this AM Will change Oxy, Robaxin , and Valium  to standing (Robaxin  and Valium  are to be staggered q3h) Will add Aquathermia, as daughter reports that this helps Normal CK level Continue PT Orthopedics consulting  Dr. Kendal is planning definitive surgery at [facility] on 7/7 and so she will transfer in the meantime   Ankle injury Foot xray on 6/27 was negative for acute fracture CT ordered and showed acute nondisplaced fracture of fibular cortex as well as possible fracture(s) of talus Orthopedics consulted and he reports that this basically amount(s) to an ankle sprain Encouraged to do ROM activities and edema control measures rather than casting or splinting Given multiple fractures, osteoporosis  is of significant concern and she should be on bisphosphonate therapy if she has not taken in the past (patient and daughter are unsure); will encourage PCP f/u   Acute on chronic hyponatremia Was thought to be SIADH during last hospitalization, was placed on fluid restriction diet as well as salt tabs Na++ on discharge 6/23 was 133 Na++ is now 127 -> 128 -> 130 -> 131 Likely exacerbated by prerenal azotemia in the setting of poor PO intake over the past several days Improving, near baseline   Hypertension Continue amlodipine , atenolol     History of breast cancer Status post lumpectomy in 11/2021   Followed by Dr. Cloretta    Depression Continue Remeron          Consultants: Orthopedics   Procedures: None   Antibiotics: None    30 Day Unplanned Readmission Risk Score    Flowsheet Row ED to Hosp-Admission (Current) from 10/25/2023 in [facility]  30 Day Unplanned Readmission Risk Score (%) 18.46 Filed at 10/28/2023 0400    This score is the patient's risk of an unplanned readmission within 30 days of being discharged (0 -100%). The score is based on dignosis, age, lab data, medications, orders, and past utilization.   Low:  0-14.9   Medium: 15-21.9   High: 22-29.9   Extreme: 30 and above           Subjective: The patient was miserable this AM, grimacing and clearly uncomfortable despite medications.  Reports that she just wants to die.   I spoke with her daughter.  She is very upset that the pain is still uncontrolled.  She was told  that she would receive standing pain meds all night, but none of the medications had been ordered as standing.  The heating pads seem to be helping.   Objective: Vitals:   10/27/23 1953 10/28/23 0409  BP: (!) 148/66 139/65  Pulse: 83 81  Resp: 17 16  Temp: 97.9 F (36.6 C) 97.8 F (36.6 C)  SpO2: 99% 96%    Intake/Output Summary (Last 24 hours) at 10/28/2023 1033 Last data filed at  10/28/2023 0409 Gross per 24 hour  Intake 120 ml  Output 600 ml  Net -480 ml   Filed Weights   10/25/23 1110  Weight: 52.1 kg    Exam:  General:  Appears very uncomfortable, tearful this AM Eyes:  normal lids, iris ENT:  grossly normal hearing, lips & tongue, mmm Cardiovascular:  RRR, no m/r/g Respiratory:   CTA bilaterally with no wheezes/rales/rhonchi.  Normal respiratory effort. Abdomen:  soft, NT, ND Skin:  no rash or induration seen on limited exam Musculoskeletal:  R ankle edema; BLE in Prevalon boots Psychiatric:  blunted mood and affect, speech sparse but appropriate Neurologic:  CN 2-12 grossly intact, moves all extremities in coordinated fashion  Data Reviewed: I have reviewed the patient's lab results since admission.  Pertinent labs for today include:   Na++ 131, improving     Family Communication: None present; I spoke with her daughter by telephone  Disposition: Status is: Inpatient Remains inpatient appropriate because: ongoing management     Time spent: 50 minutes  Unresulted Labs (From admission, onward)     Start     Ordered   Unscheduled  Basic metabolic panel with GFR  Tomorrow morning,   R        10/28/23 1033   Unscheduled  CBC with Differential/Platelet  Tomorrow morning,   R        10/28/23 1033             Author: Delon Herald, MD 10/28/2023 10:33 AM  For on call review [email or web address].

## 2023-10-28 NOTE — Plan of Care (Signed)
 Report given to Avelina, Charity fundraiser, at [facility]

## 2023-10-28 NOTE — Plan of Care (Signed)

## 2023-10-28 NOTE — Progress Notes (Signed)
 Subjective:    Patient reports pain as 5 on 0-10 scale.   Denies CP or SOB.  Voiding without difficulty. Positive flatus. Objective: Vital signs in last 24 hours: Temp:  [97.5 F (36.4 C)-97.9 F (36.6 C)] 97.8 F (36.6 C) (07/05 0409) Pulse Rate:  [77-84] 81 (07/05 0409) Resp:  [16-17] 16 (07/05 0409) BP: (139-183)/(65-80) 139/65 (07/05 0409) SpO2:  [96 %-100 %] 96 % (07/05 0409)  Intake/Output from previous day: 07/04 0701 - 07/05 0700 In: 240 [P.O.:240] Out: 600 [Urine:600] Intake/Output this shift: No intake/output data recorded.  Recent Labs    10/25/23 1215 10/26/23 0929 10/27/23 0421  HGB 12.0 10.3* 11.6*   Recent Labs    10/26/23 0929 10/27/23 0421  WBC 7.2 7.6  RBC 3.68* 4.27  HCT 30.9* 35.3*  PLT 394 446*   Recent Labs    10/27/23 0421 10/28/23 0406  NA 130* 131*  K 3.9 4.3  CL 95* 96*  CO2 23 23  BUN 11 10  CREATININE 0.54 0.53  GLUCOSE 110* 102*  CALCIUM  8.7* 8.9   No results for input(s): LABPT, INR in the last 72 hours.  Neurologically intact Compartment soft  Assessment/Plan:     Advance diet   Principal Problem:   Fracture of ramus of right pubis with routine healing Active Problems:   Ductal carcinoma in situ (DCIS) of left breast   Major depressive disorder, single episode, in full remission (HCC)   Hypertension   Hyponatremia   Pubic bone fracture (HCC)      Per Dr. Kay    Assessment/Plan: Pelvic ring injury from fall (lateral compression variant)  Discussed with Dr Kendal who recommended a trial of conservative management in this 89 yo to see how she does with gentle mobilization bed to chair. If she continues to have high level pain preventing bed to chair transfers and basic care and mobility then he would recommend SI joint reduction/pelvis reduction and stabilization with percutaneous screws. I discussed this with the daughter and the patient. As well as she is doing currently we agreed to continue therapy  (Strict NWB on the right LE) through the weekend and make a call at that point. If surgery is needed, would be next Monday at the earliest.  Continue medical optimization and electrolyte replentishment.    Right ankle CT scan show several little things which basically amount to an ankle sprain. Would avoid cast or splint immobilization over skin concerns. Would do active ROM for the ankle and edema control measures with elevation and also decub precautions.       Rec IS[facility]  Reyes JAYSON Billing 10/28/2023, @NOW 

## 2023-10-28 NOTE — Progress Notes (Signed)
 Patient to be transferred to [facility].  Plan to OR for definitive treatment of pelvic fx with Dr. Kendal on Monday.  Transfer order placed.  Eva Barrack PA-C [facility] Office:  [phone]

## 2023-10-29 DIAGNOSIS — S32501A Unspecified fracture of right pubis, initial encounter for closed fracture: Secondary | ICD-10-CM

## 2023-10-29 LAB — CBC WITH DIFFERENTIAL/PLATELET
Abs Immature Granulocytes: 0.05 K/uL (ref 0.00–0.07)
Basophils Absolute: 0.1 K/uL (ref 0.0–0.1)
Basophils Relative: 1 %
Eosinophils Absolute: 0.2 K/uL (ref 0.0–0.5)
Eosinophils Relative: 2 %
HCT: 32.2 % — ABNORMAL LOW (ref 36.0–46.0)
Hemoglobin: 11.1 g/dL — ABNORMAL LOW (ref 12.0–15.0)
Immature Granulocytes: 1 %
Lymphocytes Relative: 14 %
Lymphs Abs: 1.2 K/uL (ref 0.7–4.0)
MCH: 28.2 pg (ref 26.0–34.0)
MCHC: 34.5 g/dL (ref 30.0–36.0)
MCV: 81.7 fL (ref 80.0–100.0)
Monocytes Absolute: 0.8 K/uL (ref 0.1–1.0)
Monocytes Relative: 9 %
Neutro Abs: 6.4 K/uL (ref 1.7–7.7)
Neutrophils Relative %: 73 %
Platelets: 437 K/uL — ABNORMAL HIGH (ref 150–400)
RBC: 3.94 MIL/uL (ref 3.87–5.11)
RDW: 13.5 % (ref 11.5–15.5)
WBC: 8.7 K/uL (ref 4.0–10.5)
nRBC: 0 % (ref 0.0–0.2)

## 2023-10-29 LAB — BASIC METABOLIC PANEL WITH GFR
Anion gap: 9 (ref 5–15)
BUN: 9 mg/dL (ref 8–23)
CO2: 25 mmol/L (ref 22–32)
Calcium: 8.6 mg/dL — ABNORMAL LOW (ref 8.9–10.3)
Chloride: 96 mmol/L — ABNORMAL LOW (ref 98–111)
Creatinine, Ser: 0.55 mg/dL (ref 0.44–1.00)
GFR, Estimated: >60 mL/min (ref >60)
Glucose, Bld: 101 mg/dL — ABNORMAL HIGH (ref 70–99)
Potassium: 4.6 mmol/L (ref 3.5–5.1)
Sodium: 130 mmol/L — ABNORMAL LOW (ref 135–145)

## 2023-10-29 LAB — SURGICAL PCR SCREEN
MRSA, PCR: NEGATIVE
Staphylococcus aureus: NEGATIVE

## 2023-10-29 MED ORDER — POLYETHYLENE GLYCOL 3350 17 G PO PACK
17.0000 g | PACK | Freq: Two times a day (BID) | ORAL | Status: DC
  Administered 2023-10-29 – 2023-11-02 (×8): 17 g via ORAL
  Filled 2023-10-29 (×9): qty 1

## 2023-10-29 NOTE — Plan of Care (Signed)
  Problem: Health Behavior/Discharge Planning: Goal: Ability to manage health-related needs will improve Outcome: Progressing   Problem: Clinical Measurements: Goal: Will remain free from infection Outcome: Progressing Goal: Diagnostic test results will improve Outcome: Progressing   Problem: Nutrition: Goal: Adequate nutrition will be maintained Outcome: Progressing   Problem: Pain Managment: Goal: General experience of comfort will improve and/or be controlled Outcome: Progressing

## 2023-10-29 NOTE — Progress Notes (Signed)
 PROGRESS NOTE    Tammy Lindsey  FMW:[id number] DOB: Feb 01, 1932 DOA: 10/25/2023 PCP: Charlanne Fredia CROME, MD    Brief Narrative:  89 year old with history of breast cancer, hypertension, depression, neuropathy recently hospitalized 6/15-6/19 with a mechanical fall and pelvic fracture and transferred to a skilled nursing facility had to come back to the hospital with intractable pain associated with the fracture.  Repeat imaging showed right ilium and hemisacrum fractures.  Due to ongoing pain, patient was admitted to the hospital with plan for surgical stabilization.  Subjective: Patient seen and examined.  Her daughter was helping her to eat.  Patient herself is pleasant.  Denies any complaints at rest.  No bowel movement recorded since last 1 week.  Patient denies any abdomen pain or distention. Assessment & Plan:   Uncontrolled pain secondary to right pubic rami fracture, right ilium fracture and right hemisacrum fracture secondary to mechanical fall: Adequate pain management.  Opiate and nonopiates.  Oxycodone , lidocaine , Valium  and Robaxin .  As needed IV opiates for pain relief. No adequate bowel movements, stool softener twice daily, MiraLAX  twice daily. Due to persistent symptoms, scheduled for pelvic stabilization tomorrow 7/7.  Ankle injury: Mechanical injury to the right ankle.  Weightbearing as tolerated.  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  Previously on sodium chloride  tablets.  Sodium 130.  Will monitor.  Essential hypertension, blood pressure stable on amlodipine  and atenolol .  Depression: On Remeron .   DVT prophylaxis: enoxaparin  (LOVENOX ) injection 40 mg Start: 10/25/23 2200 Place and maintain sequential compression device Start: 10/25/23 1923   Code Status: DNR with limited intervention Family Communication: Daughter at the bedside Disposition Plan: Status is: Inpatient Remains inpatient appropriate because: Surgical plan for tomorrow     Consultants:   Orthopedics  Procedures:  None  Antimicrobials:  None     Objective: Vitals:   10/28/23 1508 10/28/23 1943 10/29/23 0335 10/29/23 0900  BP: 108/63 130/61 (!) 151/76 136/70  Pulse: 77 78 82 81  Resp: 16 18 18 17   Temp: 97.6 F (36.4 C) 98 F (36.7 C) 98.3 F (36.8 C) 97.7 F (36.5 C)  TempSrc: Oral Oral Oral Oral  SpO2: 100% 100% 99% 97%  Weight:      Height:        Intake/Output Summary (Last 24 hours) at 10/29/2023 1337 Last data filed at 10/29/2023 1100 Gross per 24 hour  Intake 420 ml  Output 1200 ml  Net -780 ml   Filed Weights   10/25/23 1110  Weight: 52.1 kg    Examination:  General exam: Appears calm and comfortable on interview.  Eating breakfast. Respiratory system: Clear to auscultation. Respiratory effort normal. Cardiovascular system: S1 & S2 heard, RRR. Gastrointestinal system: Soft and nontender.  Bowel sound present. Central nervous system: Alert and awake.  Mostly oriented.    Data Reviewed: I have personally reviewed following labs and imaging studies  CBC: Recent Labs  Lab 10/25/23 1215 10/26/23 0929 10/27/23 0421 10/29/23 0526  WBC 10.9* 7.2 7.6 8.7  NEUTROABS  --  5.6 5.7 6.4  HGB 12.0 10.3* 11.6* 11.1*  HCT 35.7* 30.9* 35.3* 32.2*  MCV 82.3 84.0 82.7 81.7  PLT 437* 394 446* 437*   Basic Metabolic Panel: Recent Labs  Lab 10/25/23 1215 10/26/23 1302 10/27/23 0421 10/28/23 0406 10/29/23 0526  NA 127* 128* 130* 131* 130*  K 4.4 4.1 3.9 4.3 4.6  CL 90* 96* 95* 96* 96*  CO2 24 23 23 23 25   GLUCOSE 123* 125* 110* 102* 101*  BUN  12 14 11 10 9   CREATININE 0.46 0.53 0.54 0.53 0.55  CALCIUM  8.6* 8.0* 8.7* 8.9 8.6*   GFR: Estimated Creatinine Clearance: 37 mL/min (by C-G formula based on SCr of 0.55 mg/dL). Liver Function Tests: No results for input(s): AST, ALT, ALKPHOS, BILITOT, PROT, ALBUMIN in the last 168 hours. No results for input(s): LIPASE, AMYLASE in the last 168 hours. No results for input(s):  AMMONIA in the last 168 hours. Coagulation Profile: No results for input(s): INR, PROTIME in the last 168 hours. Cardiac Enzymes: Recent Labs  Lab 10/27/23 1510  CKTOTAL 25*   BNP (last 3 results) No results for input(s): PROBNP in the last 8760 hours. HbA1C: No results for input(s): HGBA1C in the last 72 hours. CBG: No results for input(s): GLUCAP in the last 168 hours. Lipid Profile: No results for input(s): CHOL, HDL, LDLCALC, TRIG, CHOLHDL, LDLDIRECT in the last 72 hours. Thyroid  Function Tests: No results for input(s): TSH, T4TOTAL, FREET4, T3FREE, THYROIDAB in the last 72 hours. Anemia Panel: No results for input(s): VITAMINB12, FOLATE, FERRITIN, TIBC, IRON, RETICCTPCT in the last 72 hours. Sepsis Labs: No results for input(s): PROCALCITON, LATICACIDVEN in the last 168 hours.  No results found for this or any previous visit (from the past 240 hours).       Radiology Studies: No results found.      Scheduled Meds:  amLODipine   2.5 mg Oral Daily   atenolol   25 mg Oral QHS   cholecalciferol   1,000 Units Oral Daily   cyanocobalamin   1,000 mcg Oral Daily   diazepam   2.5 mg Intravenous Q6H   docusate sodium   100 mg Oral BID   enoxaparin  (LOVENOX ) injection  40 mg Subcutaneous QHS   folic acid   1 mg Oral QHS   lidocaine   2 patch Transdermal Q24H   methocarbamol  (ROBAXIN ) injection  500 mg Intravenous Q6H   mirtazapine   7.5 mg Oral QHS   oxyCODONE   5 mg Oral Q4H   pantoprazole   40 mg Oral Daily   polyethylene glycol  17 g Oral BID   Continuous Infusions:   LOS: 4 days    Time spent: 35 minutes    Renato Applebaum, MD [facility]

## 2023-10-30 ENCOUNTER — Inpatient Hospital Stay (HOSPITAL_COMMUNITY)

## 2023-10-30 ENCOUNTER — Encounter (HOSPITAL_COMMUNITY): Payer: Self-pay | Admitting: Anesthesiology

## 2023-10-30 ENCOUNTER — Inpatient Hospital Stay (HOSPITAL_COMMUNITY): Payer: Self-pay | Admitting: Anesthesiology

## 2023-10-30 ENCOUNTER — Other Ambulatory Visit: Payer: Self-pay

## 2023-10-30 ENCOUNTER — Encounter (HOSPITAL_COMMUNITY)
Admission: EM | Disposition: A | Discharge status: Skilled Nursing Facility | Payer: Self-pay | Source: Skilled Nursing Facility | Attending: Internal Medicine

## 2023-10-30 ENCOUNTER — Encounter (HOSPITAL_COMMUNITY): Payer: Self-pay | Admitting: Internal Medicine

## 2023-10-30 DIAGNOSIS — S32511A Fracture of superior rim of right pubis, initial encounter for closed fracture: Secondary | ICD-10-CM

## 2023-10-30 DIAGNOSIS — S32501A Unspecified fracture of right pubis, initial encounter for closed fracture: Secondary | ICD-10-CM | POA: Diagnosis not present

## 2023-10-30 HISTORY — PX: ORIF PELVIC FRACTURE WITH PERCUTANEOUS SCREWS: SHX6800

## 2023-10-30 SURGERY — CLOSED REDUCTION, PELVIS, WITH PERCUTANEOUS FIXATION
Anesthesia: General | Laterality: Right

## 2023-10-30 MED ORDER — SUGAMMADEX SODIUM 200 MG/2ML IV SOLN
INTRAVENOUS | Status: DC | PRN
  Administered 2023-10-30 (×2): 100 mg via INTRAVENOUS

## 2023-10-30 MED ORDER — DEXAMETHASONE SODIUM PHOSPHATE 10 MG/ML IJ SOLN
INTRAMUSCULAR | Status: AC
  Filled 2023-10-30: qty 2

## 2023-10-30 MED ORDER — ACETAMINOPHEN 500 MG PO TABS: 1000.0000 mg | ORAL_TABLET | Freq: Once | ORAL | Status: DC

## 2023-10-30 MED ORDER — FENTANYL CITRATE (PF) 250 MCG/5ML IJ SOLN
INTRAMUSCULAR | Status: AC
  Filled 2023-10-30: qty 5

## 2023-10-30 MED ORDER — PHENYLEPHRINE 80 MCG/ML (10ML) SYRINGE FOR IV PUSH (FOR BLOOD PRESSURE SUPPORT)
PREFILLED_SYRINGE | INTRAVENOUS | Status: DC | PRN
  Administered 2023-10-30: 240 ug via INTRAVENOUS

## 2023-10-30 MED ORDER — METOCLOPRAMIDE HCL 5 MG PO TABS: 5.0000 mg | ORAL_TABLET | Freq: Three times a day (TID) | ORAL | Status: DC | PRN

## 2023-10-30 MED ORDER — ROCURONIUM BROMIDE 10 MG/ML (PF) SYRINGE
PREFILLED_SYRINGE | INTRAVENOUS | Status: AC
  Filled 2023-10-30: qty 10

## 2023-10-30 MED ORDER — ROCURONIUM BROMIDE 10 MG/ML (PF) SYRINGE
PREFILLED_SYRINGE | INTRAVENOUS | Status: DC | PRN
  Administered 2023-10-30: 15 mg via INTRAVENOUS
  Administered 2023-10-30: 40 mg via INTRAVENOUS

## 2023-10-30 MED ORDER — LIDOCAINE 2% (20 MG/ML) 5 ML SYRINGE
INTRAMUSCULAR | Status: AC
  Filled 2023-10-30: qty 5

## 2023-10-30 MED ORDER — METOCLOPRAMIDE HCL 5 MG/ML IJ SOLN: 5.0000 mg | Freq: Three times a day (TID) | INTRAMUSCULAR | Status: DC | PRN

## 2023-10-30 MED ORDER — LACTATED RINGERS IV SOLN: INTRAVENOUS | Status: DC

## 2023-10-30 MED ORDER — ONDANSETRON HCL 4 MG/2ML IJ SOLN
INTRAMUSCULAR | Status: AC
  Filled 2023-10-30: qty 2

## 2023-10-30 MED ORDER — CEFAZOLIN SODIUM-DEXTROSE 2-4 GM/100ML-% IV SOLN
INTRAVENOUS | Status: AC
  Filled 2023-10-30: qty 100

## 2023-10-30 MED ORDER — SUCCINYLCHOLINE CHLORIDE 200 MG/10ML IV SOSY
PREFILLED_SYRINGE | INTRAVENOUS | Status: AC
  Filled 2023-10-30: qty 10

## 2023-10-30 MED ORDER — SUGAMMADEX SODIUM 200 MG/2ML IV SOLN
INTRAVENOUS | Status: AC
  Filled 2023-10-30: qty 2

## 2023-10-30 MED ORDER — OXYCODONE HCL 5 MG PO TABS: 5.0000 mg | ORAL_TABLET | Freq: Once | ORAL | Status: DC | PRN

## 2023-10-30 MED ORDER — OXYCODONE HCL 5 MG/5ML PO SOLN: 5.0000 mg | Freq: Once | ORAL | Status: DC | PRN

## 2023-10-30 MED ORDER — LIDOCAINE 2% (20 MG/ML) 5 ML SYRINGE
INTRAMUSCULAR | Status: DC | PRN
  Administered 2023-10-30: 60 mg via INTRAVENOUS

## 2023-10-30 MED ORDER — PROPOFOL 500 MG/50ML IV EMUL
INTRAVENOUS | Status: DC | PRN
  Administered 2023-10-30: 75 ug/kg/min via INTRAVENOUS

## 2023-10-30 MED ORDER — FENTANYL CITRATE (PF) 100 MCG/2ML IJ SOLN: 25.0000 ug | INTRAMUSCULAR | Status: DC | PRN

## 2023-10-30 MED ORDER — LACTATED RINGERS IV SOLN: INTRAVENOUS | Status: DC | PRN

## 2023-10-30 MED ORDER — CEFAZOLIN SODIUM-DEXTROSE 2-4 GM/100ML-% IV SOLN
2.0000 g | Freq: Three times a day (TID) | INTRAVENOUS | Status: AC
  Administered 2023-10-30 – 2023-10-31 (×3): 2 g via INTRAVENOUS
  Filled 2023-10-30 (×3): qty 100

## 2023-10-30 MED ORDER — PHENYLEPHRINE 80 MCG/ML (10ML) SYRINGE FOR IV PUSH (FOR BLOOD PRESSURE SUPPORT)
PREFILLED_SYRINGE | INTRAVENOUS | Status: AC
  Filled 2023-10-30: qty 20

## 2023-10-30 MED ORDER — PROPOFOL 10 MG/ML IV BOLUS
INTRAVENOUS | Status: DC | PRN
  Administered 2023-10-30: 30 mg via INTRAVENOUS
  Administered 2023-10-30: 20 mg via INTRAVENOUS
  Administered 2023-10-30: 100 mg via INTRAVENOUS
  Administered 2023-10-30: 20 mg via INTRAVENOUS

## 2023-10-30 MED ORDER — CHLORHEXIDINE GLUCONATE 0.12 % MT SOLN: 15.0000 mL | Freq: Once | OROMUCOSAL | Status: AC

## 2023-10-30 MED ORDER — ACETAMINOPHEN 10 MG/ML IV SOLN
INTRAVENOUS | Status: DC | PRN
  Administered 2023-10-30: 1000 mg via INTRAVENOUS

## 2023-10-30 MED ORDER — VANCOMYCIN HCL 1000 MG IV SOLR
INTRAVENOUS | Status: AC
  Filled 2023-10-30: qty 20

## 2023-10-30 MED ORDER — ORAL CARE MOUTH RINSE: 15.0000 mL | Freq: Once | OROMUCOSAL | Status: AC

## 2023-10-30 MED ORDER — DEXAMETHASONE SODIUM PHOSPHATE 10 MG/ML IJ SOLN
INTRAMUSCULAR | Status: DC | PRN
  Administered 2023-10-30: 8 mg via INTRAVENOUS

## 2023-10-30 MED ORDER — ONDANSETRON HCL 4 MG/2ML IJ SOLN
INTRAMUSCULAR | Status: DC | PRN
  Administered 2023-10-30: 4 mg via INTRAVENOUS

## 2023-10-30 MED ORDER — OXYCODONE HCL 5 MG PO TABS
2.5000 mg | ORAL_TABLET | ORAL | Status: DC | PRN
  Administered 2023-10-30 – 2023-11-03 (×5): 5 mg via ORAL
  Filled 2023-10-30 (×5): qty 1

## 2023-10-30 MED ORDER — PHENYLEPHRINE HCL-NACL 20-0.9 MG/250ML-% IV SOLN
INTRAVENOUS | Status: DC | PRN
  Administered 2023-10-30: 50 ug/min via INTRAVENOUS

## 2023-10-30 MED ORDER — CHLORHEXIDINE GLUCONATE 0.12 % MT SOLN
OROMUCOSAL | Status: AC
  Administered 2023-10-30: 15 mL via OROMUCOSAL
  Filled 2023-10-30: qty 15

## 2023-10-30 MED ORDER — AMISULPRIDE (ANTIEMETIC) 5 MG/2ML IV SOLN: 10.0000 mg | Freq: Once | INTRAVENOUS | Status: DC | PRN

## 2023-10-30 MED ORDER — FENTANYL CITRATE (PF) 250 MCG/5ML IJ SOLN
INTRAMUSCULAR | Status: DC | PRN
  Administered 2023-10-30: 25 ug via INTRAVENOUS
  Administered 2023-10-30 (×2): 50 ug via INTRAVENOUS
  Administered 2023-10-30: 25 ug via INTRAVENOUS

## 2023-10-30 SURGICAL SUPPLY — 41 items
BAG COUNTER SPONGE SURGICOUNT (BAG) ×1 IMPLANT
BIT DRILL CANN 4.5MM (BIT) IMPLANT
BLADE CLIPPER SURG (BLADE) IMPLANT
BLADE SURG 11 STRL SS (BLADE) ×1 IMPLANT
CHLORAPREP W/TINT 26 (MISCELLANEOUS) ×1 IMPLANT
DERMABOND ADVANCED .7 DNX12 (GAUZE/BANDAGES/DRESSINGS) IMPLANT
DRAPE C-ARM 42X72 X-RAY (DRAPES) ×1 IMPLANT
DRAPE C-ARMOR (DRAPES) ×1 IMPLANT
DRAPE HALF SHEET 40X57 (DRAPES) ×1 IMPLANT
DRAPE INCISE IOBAN 66X45 STRL (DRAPES) ×1 IMPLANT
DRAPE SURG 17X23 STRL (DRAPES) ×6 IMPLANT
DRAPE U-SHAPE 47X51 STRL (DRAPES) ×1 IMPLANT
DRESSING MEPILEX FLEX 4X4 (GAUZE/BANDAGES/DRESSINGS) IMPLANT
DRSG MEPILEX POST OP 4X8 (GAUZE/BANDAGES/DRESSINGS) IMPLANT
DRSG TEGADERM 4X4.75 (GAUZE/BANDAGES/DRESSINGS) IMPLANT
ELECTRODE REM PT RTRN 9FT ADLT (ELECTROSURGICAL) ×1 IMPLANT
GAUZE SPONGE 2X2 8PLY STRL LF (GAUZE/BANDAGES/DRESSINGS) IMPLANT
GLOVE BIO SURGEON STRL SZ 6.5 (GLOVE) ×3 IMPLANT
GLOVE BIO SURGEON STRL SZ7.5 (GLOVE) ×4 IMPLANT
GLOVE BIOGEL PI IND STRL 6.5 (GLOVE) ×1 IMPLANT
GLOVE BIOGEL PI IND STRL 7.5 (GLOVE) ×1 IMPLANT
GOWN STRL REUS W/ TWL LRG LVL3 (GOWN DISPOSABLE) ×2 IMPLANT
GUIDEWIRE 2.0MM (WIRE) IMPLANT
GUIDEWIRE 2.8 THREAD 450 L ST (WIRE) IMPLANT
KIT BASIN OR (CUSTOM PROCEDURE TRAY) ×1 IMPLANT
KIT TURNOVER KIT B (KITS) ×1 IMPLANT
MANIFOLD NEPTUNE II (INSTRUMENTS) ×1 IMPLANT
NS IRRIG 1000ML POUR BTL (IV SOLUTION) ×1 IMPLANT
PACK TOTAL JOINT (CUSTOM PROCEDURE TRAY) ×1 IMPLANT
PACK UNIVERSAL I (CUSTOM PROCEDURE TRAY) ×1 IMPLANT
PAD ARMBOARD POSITIONER FOAM (MISCELLANEOUS) ×2 IMPLANT
SCREW CANN FT 6.5X140 (Screw) IMPLANT
SCREW CANN FT 7.5X150 STRL (Screw) IMPLANT
SPONGE T-LAP 18X18 ~~LOC~~+RFID (SPONGE) IMPLANT
STAPLER SKIN PROX 35W (STAPLE) ×1 IMPLANT
SUCTION TUBE FRAZIER 10FR DISP (SUCTIONS) ×1 IMPLANT
SUT MNCRL AB 3-0 PS2 18 (SUTURE) ×1 IMPLANT
SUT MON AB 2-0 CT1 36 (SUTURE) ×1 IMPLANT
TRAY FOLEY MTR SLVR 16FR STAT (SET/KITS/TRAYS/PACK) IMPLANT
WASHER F/7.5 CANN SCREW (Washer) IMPLANT
WATER STERILE IRR 1000ML POUR (IV SOLUTION) ×1 IMPLANT

## 2023-10-30 NOTE — Consult Note (Signed)
 Orthopaedic Trauma Service (OTS) Consult   Patient ID: Tammy Lindsey MRN: [id number] DOB/AGE: 1931-10-14 89 y.o.  Reason for Consult:Pelvic fracture Referring Physician: Dr. Marcey Her, MD [facility]  HPI: Tammy Lindsey is an 89 y.o. female who is being seen in consultation at the request of Dr. Mariea for evaluation of pelvic fracture.  Patient had a fall approximately 2 weeks ago initially was doing well but unfortunately developing worsening pain.  Eventually she was not able to even get out of bed secondary to the pain and spasms in her legs and her pelvis.  I was reached out to due to concern about maybe percutaneous fixation would be appropriate.  I reviewed her imaging and felt that it was.  She was transferred over to [facility].  Her daughter is at bedside today.  She is otherwise active and this has decreased her activity level significantly.  Mostly the pain is located in her groins bilaterally as well as her back side of her hip.  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   Diverticulosis    Esophageal reflux    Family history of breast cancer 11/25/2021   Hiatal hernia    Hx of colonic polyps    Hypertension    Per new patient packet   Mild asthma    Per new patient packet   Osteoarthritis    Osteopenia    bone density 1-09 and 2-13   Pulmonary nodules    scattered-CT Scan July 2010, 12-10, and 04-2010-all stable felt benign     Past Surgical History:  Procedure Laterality Date   BREAST LUMPECTOMY WITH RADIOACTIVE SEED LOCALIZATION Left 12/07/2021   Procedure: LEFT BREAST LUMPECTOMY WITH RADIOACTIVE SEED LOCALIZATION;  Surgeon: Curvin Deward MOULD, MD;  Location: [facility];  Service: General;  Laterality: Left;   CHOLECYSTECTOMY  04/25/1972   Gall Bladder; [facility]   DG PORTABLE CHEST X RAY ([facility] HX)  2023   Shoulder   DIAGNOSTIC MAMMOGRAM  2022   [facility]; Per new patient packet   JOINT REPLACEMENT  Left 08/24/2010   JOINT REPLACEMENT Left 04/25/2010   Reverse    orthroscopic  Rotator Cuff Left 04/25/2009   Partial shoulder Left 04/26/2003   ROTATOR CUFF REPAIR     x 2 1998 and 1999   SPINE SURGERY  04/26/1983   disk   TONSILLECTOMY  04/25/1950   Dr. Jerome; Per new patient packet    Family History  Problem Relation Age of Onset   Cancer Mother        cervical   Alzheimer's disease Mother    Asthma Father    Other Father        Cerebral hemorrhage   Heart disease Sister        Heart disease before age 76   Hypertension Sister    Stroke Maternal Grandfather    Heart attack Maternal Grandfather    Breast cancer Daughter 43   Asthma Son    Cancer Cousin        dx early 14s; unknown type; paternal female cousin   Neuropathy Neg Hx     Social History:  reports that she has never smoked. She has never used smokeless tobacco. She reports that she does not currently use alcohol. She reports that she does not use drugs.  Allergies:  Allergies  Allergen Reactions   Arimidex  [Anastrozole ] Other (See Comments)    Caused excessive sweating.  Allergy not listed on MAR   Sulfa Antibiotics Other (See Comments)    The patient said she was told when she was 17 she was allergic to this.    Medications:  No current facility-administered medications on file prior to encounter.   Current Outpatient Medications on File Prior to Encounter  Medication Sig Dispense Refill   acetaminophen  (TYLENOL ) 500 MG tablet Take 500 mg by mouth every 8 (eight) hours as needed for mild pain (pain score 1-3) (or headaches).     amLODipine  (NORVASC ) 5 MG tablet Take 2.5 mg by mouth in the morning.     atenolol  (TENORMIN ) 25 MG tablet Take 25 mg by mouth at bedtime.     Cholecalciferol  (VITAMIN D3) 1000 units CAPS Take 2,000 Units by mouth in the morning. (Patient taking differently: Take 1,000 Units by mouth in the morning.)     Cyanocobalamin  (B-12) 1000 MCG TABS Take 1,000 mcg by mouth in the  morning.     diclofenac Sodium (VOLTAREN) 1 % GEL Apply 1 Application topically every 6 (six) hours as needed (for pain).     docusate sodium  (COLACE) 100 MG capsule Take 1 capsule (100 mg total) by mouth 2 (two) times daily.     folic acid  (FOLVITE ) 800 MCG tablet Take 800 mcg by mouth at bedtime.     hydrALAZINE  (APRESOLINE ) 10 MG tablet Take 1 tablet (10 mg total) by mouth as needed (for blood pressure 150 or above). 30 tablet 3   lidocaine  4 % Place 1 patch onto the skin in the morning and at bedtime.     meloxicam  (MOBIC ) 15 MG tablet Take 1 tablet (15 mg total) by mouth daily for 7 days.     mirtazapine  (REMERON ) 15 MG tablet Take 7.5 mg by mouth at bedtime.     ondansetron  (ZOFRAN ) 4 MG tablet Take 4 mg by mouth every 6 (six) hours as needed for nausea or vomiting.     oxyCODONE  (OXY IR/ROXICODONE ) 5 MG immediate release tablet Take 1 tablet (5 mg total) by mouth every 8 (eight) hours as needed for severe pain (pain score 7-10). 30 tablet 0   pantoprazole  (PROTONIX ) 40 MG tablet Take 1 tablet (40 mg total) by mouth daily. 30 tablet 3   polyethylene glycol (MIRALAX  / GLYCOLAX ) 17 g packet Take 17 g by mouth daily.     SYSTANE COMPLETE PF 0.6 % SOLN Place 1 drop into both eyes 3 (three) times daily as needed (for dryness).     methocarbamol  (ROBAXIN ) 500 MG tablet Take 1 tablet (500 mg total) by mouth 4 (four) times daily. (Patient not taking: Reported on 10/25/2023)     sodium chloride  1 g tablet Take 1 g by mouth once. (Patient not taking: Reported on 10/25/2023)       ROS: Constitutional: No fever or chills Vision: No changes in vision ENT: No difficulty swallowing CV: No chest pain Pulm: No SOB or wheezing GI: No nausea or vomiting GU: No urgency or inability to hold urine Skin: No poor wound healing Neurologic: No numbness or tingling Psychiatric: No depression or anxiety Heme: No bruising Allergic: No reaction to medications or food   Exam: Blood pressure (!) 147/79, pulse 87,  temperature 97.9 F (36.6 C), temperature source Oral, resp. rate 18, height 5' 2 (1.575 m), weight 52.1 kg, SpO2 97%. General: No acute distress Orientation: Awake alert and oriented Mood and Affect: Cooperative and pleasant Gait: Unable to assess due to her fracture Coordination and balance: Within  normal limits  Pelvis and right lower extremity reveals skin without lesions.  No deformity.  No significant shortening of the leg.  Sensation and motor function is intact distally.  She has 2+ DP pulses with warm well-perfused foot.  Left lower extremity: Skin without lesions. No tenderness to palpation. Full painless ROM, full strength in each muscle groups without evidence of instability.   Medical Decision Making: Data: Imaging: X-rays and CT scan of been reviewed which shows a LC injury of her pelvis with significant displacement of her right superior pubic ramus.  Impaction fracture of the zone 1 sacrum.  Labs:  Results for orders placed or performed during the hospital encounter of 10/25/23 (from the past 24 hours)  Surgical PCR screen     Status: None   Collection Time: 10/29/23  9:18 PM   Specimen: Nasal Mucosa; Nasal Swab  Result Value Ref Range   MRSA, PCR NEGATIVE NEGATIVE   Staphylococcus aureus NEGATIVE NEGATIVE     Imaging or Labs ordered: None  Medical history and chart was reviewed and case discussed with medical provider.  Assessment/Plan: 89 year old female with a lateral compression pelvic ring injury with inability to mobilize approximately 3 weeks out.  Due to her's precipitous decline I recommend proceeding with a stress examination and percutaneous fixation of her pelvis.  Risks and benefits were discussed with the patient's daughter.  Risks include but not limited to bleeding, infection, malunion, nonunion, hardware failure, hardware rotation, nerve and blood vessel injury, continued pain continued limited mobility.  Even the possibility of DVT and anesthetic  complications.  They agreed to proceed with surgery and consent was obtained.  Franky MYRTIS Light, MD [facility] [phone] (office) [email or web address]

## 2023-10-30 NOTE — H&P (View-Only) (Signed)
 Orthopaedic Trauma Service (OTS) Consult   Patient ID: Tammy Lindsey MRN: [id number] DOB/AGE: 1931-10-14 89 y.o.  Reason for Consult:Pelvic fracture Referring Physician: Dr. Marcey Her, MD [facility]  HPI: Tammy Lindsey is an 89 y.o. female who is being seen in consultation at the request of Dr. Mariea for evaluation of pelvic fracture.  Patient had a fall approximately 2 weeks ago initially was doing well but unfortunately developing worsening pain.  Eventually she was not able to even get out of bed secondary to the pain and spasms in her legs and her pelvis.  I was reached out to due to concern about maybe percutaneous fixation would be appropriate.  I reviewed her imaging and felt that it was.  She was transferred over to [facility].  Her daughter is at bedside today.  She is otherwise active and this has decreased her activity level significantly.  Mostly the pain is located in her groins bilaterally as well as her back side of her hip.  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   Diverticulosis    Esophageal reflux    Family history of breast cancer 11/25/2021   Hiatal hernia    Hx of colonic polyps    Hypertension    Per new patient packet   Mild asthma    Per new patient packet   Osteoarthritis    Osteopenia    bone density 1-09 and 2-13   Pulmonary nodules    scattered-CT Scan July 2010, 12-10, and 04-2010-all stable felt benign     Past Surgical History:  Procedure Laterality Date   BREAST LUMPECTOMY WITH RADIOACTIVE SEED LOCALIZATION Left 12/07/2021   Procedure: LEFT BREAST LUMPECTOMY WITH RADIOACTIVE SEED LOCALIZATION;  Surgeon: Curvin Deward MOULD, MD;  Location: [facility];  Service: General;  Laterality: Left;   CHOLECYSTECTOMY  04/25/1972   Gall Bladder; [facility]   DG PORTABLE CHEST X RAY ([facility] HX)  2023   Shoulder   DIAGNOSTIC MAMMOGRAM  2022   [facility]; Per new patient packet   JOINT REPLACEMENT  Left 08/24/2010   JOINT REPLACEMENT Left 04/25/2010   Reverse    orthroscopic  Rotator Cuff Left 04/25/2009   Partial shoulder Left 04/26/2003   ROTATOR CUFF REPAIR     x 2 1998 and 1999   SPINE SURGERY  04/26/1983   disk   TONSILLECTOMY  04/25/1950   Dr. Jerome; Per new patient packet    Family History  Problem Relation Age of Onset   Cancer Mother        cervical   Alzheimer's disease Mother    Asthma Father    Other Father        Cerebral hemorrhage   Heart disease Sister        Heart disease before age 76   Hypertension Sister    Stroke Maternal Grandfather    Heart attack Maternal Grandfather    Breast cancer Daughter 43   Asthma Son    Cancer Cousin        dx early 14s; unknown type; paternal female cousin   Neuropathy Neg Hx     Social History:  reports that she has never smoked. She has never used smokeless tobacco. She reports that she does not currently use alcohol. She reports that she does not use drugs.  Allergies:  Allergies  Allergen Reactions   Arimidex  [Anastrozole ] Other (See Comments)    Caused excessive sweating.  Allergy not listed on MAR   Sulfa Antibiotics Other (See Comments)    The patient said she was told when she was 17 she was allergic to this.    Medications:  No current facility-administered medications on file prior to encounter.   Current Outpatient Medications on File Prior to Encounter  Medication Sig Dispense Refill   acetaminophen  (TYLENOL ) 500 MG tablet Take 500 mg by mouth every 8 (eight) hours as needed for mild pain (pain score 1-3) (or headaches).     amLODipine  (NORVASC ) 5 MG tablet Take 2.5 mg by mouth in the morning.     atenolol  (TENORMIN ) 25 MG tablet Take 25 mg by mouth at bedtime.     Cholecalciferol  (VITAMIN D3) 1000 units CAPS Take 2,000 Units by mouth in the morning. (Patient taking differently: Take 1,000 Units by mouth in the morning.)     Cyanocobalamin  (B-12) 1000 MCG TABS Take 1,000 mcg by mouth in the  morning.     diclofenac Sodium (VOLTAREN) 1 % GEL Apply 1 Application topically every 6 (six) hours as needed (for pain).     docusate sodium  (COLACE) 100 MG capsule Take 1 capsule (100 mg total) by mouth 2 (two) times daily.     folic acid  (FOLVITE ) 800 MCG tablet Take 800 mcg by mouth at bedtime.     hydrALAZINE  (APRESOLINE ) 10 MG tablet Take 1 tablet (10 mg total) by mouth as needed (for blood pressure 150 or above). 30 tablet 3   lidocaine  4 % Place 1 patch onto the skin in the morning and at bedtime.     meloxicam  (MOBIC ) 15 MG tablet Take 1 tablet (15 mg total) by mouth daily for 7 days.     mirtazapine  (REMERON ) 15 MG tablet Take 7.5 mg by mouth at bedtime.     ondansetron  (ZOFRAN ) 4 MG tablet Take 4 mg by mouth every 6 (six) hours as needed for nausea or vomiting.     oxyCODONE  (OXY IR/ROXICODONE ) 5 MG immediate release tablet Take 1 tablet (5 mg total) by mouth every 8 (eight) hours as needed for severe pain (pain score 7-10). 30 tablet 0   pantoprazole  (PROTONIX ) 40 MG tablet Take 1 tablet (40 mg total) by mouth daily. 30 tablet 3   polyethylene glycol (MIRALAX  / GLYCOLAX ) 17 g packet Take 17 g by mouth daily.     SYSTANE COMPLETE PF 0.6 % SOLN Place 1 drop into both eyes 3 (three) times daily as needed (for dryness).     methocarbamol  (ROBAXIN ) 500 MG tablet Take 1 tablet (500 mg total) by mouth 4 (four) times daily. (Patient not taking: Reported on 10/25/2023)     sodium chloride  1 g tablet Take 1 g by mouth once. (Patient not taking: Reported on 10/25/2023)       ROS: Constitutional: No fever or chills Vision: No changes in vision ENT: No difficulty swallowing CV: No chest pain Pulm: No SOB or wheezing GI: No nausea or vomiting GU: No urgency or inability to hold urine Skin: No poor wound healing Neurologic: No numbness or tingling Psychiatric: No depression or anxiety Heme: No bruising Allergic: No reaction to medications or food   Exam: Blood pressure (!) 147/79, pulse 87,  temperature 97.9 F (36.6 C), temperature source Oral, resp. rate 18, height 5' 2 (1.575 m), weight 52.1 kg, SpO2 97%. General: No acute distress Orientation: Awake alert and oriented Mood and Affect: Cooperative and pleasant Gait: Unable to assess due to her fracture Coordination and balance: Within  normal limits  Pelvis and right lower extremity reveals skin without lesions.  No deformity.  No significant shortening of the leg.  Sensation and motor function is intact distally.  She has 2+ DP pulses with warm well-perfused foot.  Left lower extremity: Skin without lesions. No tenderness to palpation. Full painless ROM, full strength in each muscle groups without evidence of instability.   Medical Decision Making: Data: Imaging: X-rays and CT scan of been reviewed which shows a LC injury of her pelvis with significant displacement of her right superior pubic ramus.  Impaction fracture of the zone 1 sacrum.  Labs:  Results for orders placed or performed during the hospital encounter of 10/25/23 (from the past 24 hours)  Surgical PCR screen     Status: None   Collection Time: 10/29/23  9:18 PM   Specimen: Nasal Mucosa; Nasal Swab  Result Value Ref Range   MRSA, PCR NEGATIVE NEGATIVE   Staphylococcus aureus NEGATIVE NEGATIVE     Imaging or Labs ordered: None  Medical history and chart was reviewed and case discussed with medical provider.  Assessment/Plan: 89 year old female with a lateral compression pelvic ring injury with inability to mobilize approximately 3 weeks out.  Due to her's precipitous decline I recommend proceeding with a stress examination and percutaneous fixation of her pelvis.  Risks and benefits were discussed with the patient's daughter.  Risks include but not limited to bleeding, infection, malunion, nonunion, hardware failure, hardware rotation, nerve and blood vessel injury, continued pain continued limited mobility.  Even the possibility of DVT and anesthetic  complications.  They agreed to proceed with surgery and consent was obtained.  Franky MYRTIS Light, MD [facility] [phone] (office) [email or web address]

## 2023-10-30 NOTE — Anesthesia Postprocedure Evaluation (Signed)
 Anesthesia Post Note  Patient: Tammy Lindsey  Procedure(s) Performed: CLOSED REDUCTION, PELVIS, WITH PERCUTANEOUS FIXATION (Right)     Patient location during evaluation: PACU Anesthesia Type: General Level of consciousness: awake Pain management: pain level controlled Vital Signs Assessment: post-procedure vital signs reviewed and stable Respiratory status: spontaneous breathing, nonlabored ventilation and respiratory function stable Cardiovascular status: blood pressure returned to baseline and stable Postop Assessment: no apparent nausea or vomiting Anesthetic complications: no   No notable events documented.  Last Vitals:  Vitals:   10/30/23 1415 10/30/23 1430  BP: 120/62 (!) 115/53  Pulse: 90 88  Resp: 16 13  Temp:  36.6 C  SpO2: 94% 92%    Last Pain:  Vitals:   10/30/23 1600  TempSrc:   PainSc: 0-No pain                 Delon Aisha Arch

## 2023-10-30 NOTE — Progress Notes (Signed)
 PROGRESS NOTE    Tammy Lindsey  FMW:[id number] DOB: 1931-09-02 DOA: 10/25/2023 PCP: Charlanne Fredia CROME, MD    Brief Narrative:  89 year old with history of breast cancer, hypertension, depression, neuropathy recently hospitalized 6/15-6/19 with a mechanical fall and pelvic fracture and transferred to a skilled nursing facility had to come back to the hospital with intractable pain associated with the fracture.  Repeat imaging showed right ilium and hemisacrum fractures.  Due to ongoing pain, patient was admitted to the hospital with plan for surgical stabilization.  Subjective:  Patient seen in the morning rounds before going to procedure.  Her daughter was at the bedside.  Nursing staff was giving her personal care.  Patient reported ongoing discomfort especially with moving.  Her daughter reported patient having a lot of spasm and pain on her left thigh area.  No other overnight events.   Assessment & Plan:   Uncontrolled pain secondary to right pubic rami fracture, right ilium fracture and right hemisacrum fracture secondary to mechanical fall: Adequate pain management.  Opiate and nonopiates.  Oxycodone , lidocaine , Valium  and Robaxin .  As needed IV opiates for pain relief. No adequate bowel movements, stool softener twice daily, MiraLAX  twice daily. Due to persistent symptoms, scheduled for pelvic stabilization today. Will check x-ray of the left femur.  Ankle injury: Mechanical injury to the right ankle.  Weightbearing as tolerated.  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  Previously on sodium chloride  tablets.  Sodium 130.  Will monitor.  Essential hypertension, blood pressure stable on amlodipine  and atenolol .  Depression: On Remeron .   DVT prophylaxis: enoxaparin  (LOVENOX ) injection 40 mg Start: 10/25/23 2200 Place and maintain sequential compression device Start: 10/25/23 1923   Code Status: DNR with limited intervention Family Communication: Daughter at the  bedside Disposition Plan: Status is: Inpatient Remains inpatient appropriate because: Inpatient surgery planned     Consultants:  Orthopedics  Procedures:  None  Antimicrobials:  None     Objective: Vitals:   10/30/23 0454 10/30/23 0828 10/30/23 1156 10/30/23 1212  BP: 120/60 (!) 112/58 (!) 147/79   Pulse: 80 82  87  Resp: 17 16 18    Temp: 98.1 F (36.7 C) (!) 97.5 F (36.4 C)  97.9 F (36.6 C)  TempSrc: Oral Oral  Oral  SpO2: 95% 98%  97%  Weight:      Height:        Intake/Output Summary (Last 24 hours) at 10/30/2023 1319 Last data filed at 10/29/2023 2314 Gross per 24 hour  Intake --  Output 1200 ml  Net -1200 ml   Filed Weights   10/25/23 1110  Weight: 52.1 kg    Examination:  General exam: Appears uncomfortable with movement.  No palpable tenderness. Respiratory system: Clear to auscultation. Respiratory effort normal. Cardiovascular system: S1 & S2 heard, RRR. Gastrointestinal system: Soft and nontender.  Bowel sound present. Central nervous system: Alert and awake.  Mostly oriented.    Data Reviewed: I have personally reviewed following labs and imaging studies  CBC: Recent Labs  Lab 10/25/23 1215 10/26/23 0929 10/27/23 0421 10/29/23 0526  WBC 10.9* 7.2 7.6 8.7  NEUTROABS  --  5.6 5.7 6.4  HGB 12.0 10.3* 11.6* 11.1*  HCT 35.7* 30.9* 35.3* 32.2*  MCV 82.3 84.0 82.7 81.7  PLT 437* 394 446* 437*   Basic Metabolic Panel: Recent Labs  Lab 10/25/23 1215 10/26/23 1302 10/27/23 0421 10/28/23 0406 10/29/23 0526  NA 127* 128* 130* 131* 130*  K 4.4 4.1 3.9 4.3 4.6  CL 90*  96* 95* 96* 96*  CO2 24 23 23 23 25   GLUCOSE 123* 125* 110* 102* 101*  BUN 12 14 11 10 9   CREATININE 0.46 0.53 0.54 0.53 0.55  CALCIUM  8.6* 8.0* 8.7* 8.9 8.6*   GFR: Estimated Creatinine Clearance: 37 mL/min (by C-G formula based on SCr of 0.55 mg/dL). Liver Function Tests: No results for input(s): AST, ALT, ALKPHOS, BILITOT, PROT, ALBUMIN in the last  168 hours. No results for input(s): LIPASE, AMYLASE in the last 168 hours. No results for input(s): AMMONIA in the last 168 hours. Coagulation Profile: No results for input(s): INR, PROTIME in the last 168 hours. Cardiac Enzymes: Recent Labs  Lab 10/27/23 1510  CKTOTAL 25*   BNP (last 3 results) No results for input(s): PROBNP in the last 8760 hours. HbA1C: No results for input(s): HGBA1C in the last 72 hours. CBG: No results for input(s): GLUCAP in the last 168 hours. Lipid Profile: No results for input(s): CHOL, HDL, LDLCALC, TRIG, CHOLHDL, LDLDIRECT in the last 72 hours. Thyroid  Function Tests: No results for input(s): TSH, T4TOTAL, FREET4, T3FREE, THYROIDAB in the last 72 hours. Anemia Panel: No results for input(s): VITAMINB12, FOLATE, FERRITIN, TIBC, IRON, RETICCTPCT in the last 72 hours. Sepsis Labs: No results for input(s): PROCALCITON, LATICACIDVEN in the last 168 hours.  Recent Results (from the past 240 hours)  Surgical PCR screen     Status: None   Collection Time: 10/29/23  9:18 PM   Specimen: Nasal Mucosa; Nasal Swab  Result Value Ref Range Status   MRSA, PCR NEGATIVE NEGATIVE Final   Staphylococcus aureus NEGATIVE NEGATIVE Final    Comment: (NOTE) The Xpert SA Assay (FDA approved for NASAL specimens in patients 44 years of age and older), is one component of a comprehensive surveillance program. It is not intended to diagnose infection nor to guide or monitor treatment. Performed at [facility], [address], [place], [place] [address]          Radiology Studies: No results found.      Scheduled Meds:  acetaminophen   1,000 mg Oral Once   [MAR Hold] amLODipine   2.5 mg Oral Daily   [MAR Hold] atenolol   25 mg Oral QHS   [MAR Hold] cholecalciferol   1,000 Units Oral Daily   [MAR Hold] cyanocobalamin   1,000 mcg Oral Daily   [MAR Hold] diazepam   2.5 mg Intravenous Q6H   [MAR Hold]  docusate sodium   100 mg Oral BID   [MAR Hold] enoxaparin  (LOVENOX ) injection  40 mg Subcutaneous QHS   [MAR Hold] folic acid   1 mg Oral QHS   [MAR Hold] lidocaine   2 patch Transdermal Q24H   [MAR Hold] methocarbamol  (ROBAXIN ) injection  500 mg Intravenous Q6H   [MAR Hold] mirtazapine   7.5 mg Oral QHS   [MAR Hold] oxyCODONE   5 mg Oral Q4H   [MAR Hold] pantoprazole   40 mg Oral Daily   [MAR Hold] polyethylene glycol  17 g Oral BID   Continuous Infusions:  ceFAZolin      lactated ringers  10 mL/hr at 10/30/23 1246     LOS: 5 days    Time spent: 35 minutes    Renato Applebaum, MD [facility]

## 2023-10-30 NOTE — Progress Notes (Signed)
 Wedding Band and necklace with charm were both handed to daughter at bedside before pt was transported to OR.  NT x2 at bedside to witness.

## 2023-10-30 NOTE — Anesthesia Procedure Notes (Signed)
 Procedure Name: Intubation Date/Time: 10/30/2023 12:54 PM  Performed by: Boyce Shilling, CRNAPre-anesthesia Checklist: Patient identified, Emergency Drugs available, Suction available, Timeout performed and Patient being monitored Patient Re-evaluated:Patient Re-evaluated prior to induction Oxygen Delivery Method: Circle system utilized Preoxygenation: Pre-oxygenation with 100% oxygen Induction Type: IV induction Ventilation: Mask ventilation without difficulty Laryngoscope Size: Mac and 3 Grade View: Grade I Tube type: Oral Tube size: 7.0 mm Number of attempts: 1 Airway Equipment and Method: Stylet Placement Confirmation: ETT inserted through vocal cords under direct vision, positive ETCO2, CO2 detector and breath sounds checked- equal and bilateral Secured at: 22 cm Tube secured with: Tape Dental Injury: Teeth and Oropharynx as per pre-operative assessment

## 2023-10-30 NOTE — Anesthesia Preprocedure Evaluation (Addendum)
 Anesthesia Evaluation  Patient identified by MRN, date of birth, ID band Patient awake    Reviewed: Allergy & Precautions, NPO status , Patient's Chart, lab work & pertinent test results  History of Anesthesia Complications Negative for: history of anesthetic complications  Airway Mallampati: III  TM Distance: >3 FB Neck ROM: Full    Dental  (+) Dental Advisory Given, Caps   Pulmonary neg shortness of breath, asthma (rare inhaler use) , neg sleep apnea, neg COPD, neg recent URI   Pulmonary exam normal breath sounds clear to auscultation       Cardiovascular hypertension (amlodipine , atenolol , hydralazine ), Pt. on medications and Pt. on home beta blockers (-) angina (-) Past MI, (-) Cardiac Stents and (-) CABG (-) dysrhythmias + Valvular Problems/Murmurs  Rhythm:Regular Rate:Normal     Neuro/Psych neg Seizures PSYCHIATRIC DISORDERS  Depression     Neuromuscular disease (lumbar radiculopathy, neuropathy)    GI/Hepatic Neg liver ROS, hiatal hernia,GERD  Medicated,,Diverticulosis    Endo/Other  negative endocrine ROS    Renal/GU negative Renal ROS     Musculoskeletal  (+) Arthritis , Osteoarthritis,    Abdominal   Peds  Hematology  (+) Blood dyscrasia, anemia Lab Results      Component                Value               Date                      WBC                      8.7                 10/29/2023                HGB                      11.1 (L)            10/29/2023                HCT                      32.2 (L)            10/29/2023                MCV                      81.7                10/29/2023                PLT                      437 (H)             10/29/2023              Anesthesia Other Findings 89 year old with history of breast cancer, hypertension, depression, neuropathy recently hospitalized 6/15-6/19 with a mechanical fall and pelvic fracture and transferred to a skilled nursing facility had  to come back to the hospital with intractable pain associated with the fracture.  Repeat imaging showed right ilium and hemisacrum fractures.  Due to ongoing pain, patient was admitted to the hospital with plan for surgical stabilization.  Reproductive/Obstetrics  Anesthesia Physical Anesthesia Plan  ASA: 2  Anesthesia Plan: General   Post-op Pain Management:    Induction: Intravenous  PONV Risk Score and Plan: 3 and Ondansetron , Dexamethasone , Propofol  infusion, TIVA and Treatment may vary due to age or medical condition  Airway Management Planned: Oral ETT  Additional Equipment:   Intra-op Plan:   Post-operative Plan: Extubation in OR  Informed Consent: I have reviewed the patients History and Physical, chart, labs and discussed the procedure including the risks, benefits and alternatives for the proposed anesthesia with the patient or authorized representative who has indicated his/her understanding and acceptance.   Patient has DNR.  Discussed DNR with patient and Suspend DNR.   Dental advisory given  Plan Discussed with: CRNA and Anesthesiologist  Anesthesia Plan Comments: (Patient's daughter, Almarie, at bedside and present for consent.  Risks of general anesthesia discussed including, but not limited to, sore throat, hoarse voice, chipped/damaged teeth, injury to vocal cords, nausea and vomiting, allergic reactions, lung infection, heart attack, stroke, and death. All questions answered. )        Anesthesia Quick Evaluation

## 2023-10-30 NOTE — Op Note (Signed)
 Orthopaedic Surgery Operative Note (CSN: [id number] ) Date of Surgery: 10/30/2023  Admit Date: 10/25/2023   Diagnoses: Pre-Op Diagnoses: Lateral compression pelvic ring injury  Post-Op Diagnosis: Same  Procedures: CPT 27216-Percutaneous fixation of posterior pelvic fracture CPT 27217-Percutaneous fixation of right superior pubic ramus fracture  Surgeons : Primary: Kendal Franky SQUIBB, MD  Assistant: Lauraine Moores, PA-C  Location: OR 8   Anesthesia: General   Antibiotics: Ancef  2g preop   Tourniquet time: none    Estimated Blood Loss: 25 mL  Complications:* No complications entered in OR log *   Specimens:* No specimens in log *   Implants: Implant Name Type Inv. Item Serial No. Manufacturer Lot No. LRB No. Used Action  WASHER F/7.5 CANN SCREW - [id number] Washer WASHER F/7.5 CANN SCREW  [employer] [id number] Right 1 Implanted  SCREW CANN FT 7.5X150 STRL - [id number] Screw SCREW CANN FT 7.5X150 STRL  [employer] [id number] Right 1 Implanted  cannulated screw    SYNTHES  Right 1 Implanted     Indications for Surgery: 89 year old female who sustained a lateral compression pelvic ring injury.  She initially was managed nonoperatively but unfortunately she developed significant pain and was not able to mobilize.  Due to her inability mobilize and potential unstable nature of her injury I recommend proceeding with a stress examination with percutaneous fixation of her pelvic ring injury.  Risk and benefits were discussed with the patient and her daughter.  Risk include but not limited to bleeding, infection, malunion, nonunion, hardware failure, hardware irritation, nerve and blood vessel injury, DVT, continued pain, even the possibility anesthetic complications.  They agreed to proceed with surgery and consent was obtained.  Operative Findings: 1.  Percutaneous fixation of posterior pelvic ring using Synthes fully threaded 7.5 mm cannulated screw with a washer transsacral  transiliac at S2. 2.  Percutaneous fixation of right superior pubic ramus fracture using retrograde Synthes 6.5 mm fully threaded cannulated screw  Procedure: The patient was identified in the preoperative holding area. Consent was confirmed with the patient and their family and all questions were answered. The operative extremity was marked after confirmation with the patient. she was then brought back to the operating room by our anesthesia colleagues.  She was placed under general anesthetic and carefully transferred over to radiolucent flattop table.  A bump was placed under her sacrum to elevate her pelvis for appropriate positioning for screw fixation.  Fluoroscopic imaging showed the unstable nature of her injury.  The pelvis was then prepped and draped in usual sterile fashion.  A timeout was performed to verify the patient, the procedure, and the extremity.  Preoperative antibiotics were dosed.  Using inlet and outlet views I directed the threaded guidewire at the appropriate starting point for a transsacral transiliac screw at S2.  I advanced the guidewire into the bone approximately 1 cm.  I cut down on this with 11 blade.  I then used a 4.5 mm cannulated drill bit to oscillate across the right sided SI joint across the S2 body until I reached the far neuroforamen.  I then removed the cannulated drill bit and placed a 2.8 mm threaded guidewire across the remainder of the pelvis into the left SI joint and left lateral ilium.  I then measured the length and chose to use 150 mm fully threaded 7.5 millimeter screw with a washer.  This was placed and excellent fixation was obtained.  I then proceeded to use inlet and obturator outlet views to direct guidewire for a  retrograde superior pubic ramus screw.  It was placed at the pubic symphysis and directed into the bone proximally 1 cm.  I cut down on this with 11 blade.  I then used a 4.5 mm cannulated drill bit to oscillate across the fracture into the  superior pubic ramus.  Across the fracture then removed my drill bit.  I then passed a bent 2.8 mm threaded guidewire into the anterior column proximally into the supra-acetabular region.  I then measured the length and chose to use 140 mm fully threaded 6.5 mm cannulated screw.  This was placed and excellent fixation was obtained.  Final fluoroscopic imaging was obtained.  The incision was copiously irrigated.  A layered closure of 3-0 Monocryl Dermabond was used to close the skin.  Sterile dressings were applied.  The patient was then awoke from anesthesia and taken to the PACU in stable condition.   Post Op Plan/Instructions: Patient will be weightbearing as tolerated bilateral lower extremities.  She will receive postoperative Ancef .  She will be placed on Lovenox  for DVT prophylaxis and discharged on aspirin .  Will have her mobilize with physical and Occupational Therapy.  I was present and performed the entire surgery.  Lauraine Moores, PA-C did assist me throughout the case. An assistant was necessary given the difficulty in approach, maintenance of reduction and ability to instrument the fracture.   Franky Light, MD [facility]

## 2023-10-30 NOTE — Interval H&P Note (Signed)
 History and Physical Interval Note:  10/30/2023 12:36 PM  Tammy Lindsey  has presented today for surgery, with the diagnosis of Pelvic fracture.  The various methods of treatment have been discussed with the patient and family. After consideration of risks, benefits and other options for treatment, the patient has consented to  Procedure(s): CLOSED REDUCTION, PELVIS, WITH PERCUTANEOUS FIXATION (Right) as a surgical intervention.  The patient's history has been reviewed, patient examined, no change in status, stable for surgery.  I have reviewed the patient's chart and labs.  Questions were answered to the patient's satisfaction.     Tammy Lindsey

## 2023-10-30 NOTE — Transfer of Care (Signed)
 Immediate Anesthesia Transfer of Care Note  Patient: Tammy Lindsey  Procedure(s) Performed: CLOSED REDUCTION, PELVIS, WITH PERCUTANEOUS FIXATION (Right)  Patient Location: PACU  Anesthesia Type:General  Level of Consciousness: awake and drowsy  Airway & Oxygen Therapy: Patient Spontanous Breathing and Patient connected to face mask oxygen  Post-op Assessment: Report given to RN and Post -op Vital signs reviewed and stable  Post vital signs: Reviewed and stable  Last Vitals:  Vitals Value Taken Time  BP 144/71 10/30/23 13:54  Temp    Pulse 90 10/30/23 13:57  Resp 15 10/30/23 13:57  SpO2 98 % 10/30/23 13:57  Vitals shown include unfiled device data.  Last Pain:  Vitals:   10/30/23 1212  TempSrc: Oral  PainSc:       Patients Stated Pain Goal: 0 (10/25/23 1936)  Complications: No notable events documented.

## 2023-10-31 DIAGNOSIS — S32501A Unspecified fracture of right pubis, initial encounter for closed fracture: Secondary | ICD-10-CM | POA: Diagnosis not present

## 2023-10-31 LAB — BASIC METABOLIC PANEL WITH GFR
Anion gap: 9 (ref 5–15)
BUN: 13 mg/dL (ref 8–23)
CO2: 23 mmol/L (ref 22–32)
Calcium: 8.4 mg/dL — ABNORMAL LOW (ref 8.9–10.3)
Chloride: 100 mmol/L (ref 98–111)
Creatinine, Ser: 0.53 mg/dL (ref 0.44–1.00)
GFR, Estimated: >60 mL/min (ref >60)
Glucose, Bld: 117 mg/dL — ABNORMAL HIGH (ref 70–99)
Potassium: 3.9 mmol/L (ref 3.5–5.1)
Sodium: 132 mmol/L — ABNORMAL LOW (ref 135–145)

## 2023-10-31 LAB — CBC
HCT: 31.3 % — ABNORMAL LOW (ref 36.0–46.0)
Hemoglobin: 10.4 g/dL — ABNORMAL LOW (ref 12.0–15.0)
MCH: 27.4 pg (ref 26.0–34.0)
MCHC: 33.2 g/dL (ref 30.0–36.0)
MCV: 82.6 fL (ref 80.0–100.0)
Platelets: 395 K/uL (ref 150–400)
RBC: 3.79 MIL/uL — ABNORMAL LOW (ref 3.87–5.11)
RDW: 13.3 % (ref 11.5–15.5)
WBC: 9.6 K/uL (ref 4.0–10.5)
nRBC: 0 % (ref 0.0–0.2)

## 2023-10-31 MED ORDER — ACETAMINOPHEN 650 MG RE SUPP
650.0000 mg | Freq: Four times a day (QID) | RECTAL | Status: DC
  Filled 2023-10-31: qty 1

## 2023-10-31 MED ORDER — ACETAMINOPHEN 325 MG PO TABS
650.0000 mg | ORAL_TABLET | Freq: Four times a day (QID) | ORAL | Status: DC
  Administered 2023-10-31 – 2023-11-01 (×5): 650 mg via ORAL
  Filled 2023-10-31 (×5): qty 2

## 2023-10-31 NOTE — Evaluation (Signed)
 Physical Therapy Evaluation  Patient Details Name: Tammy Lindsey MRN: [id number] DOB: 1931-08-16 Today's Date: 10/31/2023  History of Present Illness  Tammy Lindsey is a 89 y.o. female who returned to hospital from SNF due to increase in pelvic pain. Repeat imaging showed right ilium and hemisacrum fractures. She is now s/p closed reduction of pelvis with percutaneous fixation 7/7. PMHx: breast cancer, hypertension, depression, neuropathy recently hospitalized 6/15-6/19 with a mechanical fall and pelvic fracture and transferred to a skilled nursing facility   Clinical Impression  Pt admitted with above diagnosis. Pt currently with functional limitations due to the deficits listed below (see PT Problem List). At the time of PT eval pt required +2 assist for all aspects of mobility. Pt was able to transfer to Tammy Lindsey and then to recliner chair. Recommend continued multidisciplinary rehab at d/c <3 hours/day to maximize functional recovery. Pt will benefit from acute skilled PT to increase their independence and safety with mobility to allow discharge.           If plan is discharge home, recommend the following: Two people to help with bathing/dressing/bathroom;Two people to help with walking and/or transfers;Assistance with cooking/housework;Assist for transportation;Supervision due to cognitive status   Can travel by private vehicle   No    Equipment Recommendations None recommended by PT  Recommendations for Other Services       Functional Status Assessment Patient has had a recent decline in their functional status and demonstrates the ability to make significant improvements in function in a reasonable and predictable amount of time.     Precautions / Restrictions Precautions Precautions: Fall Recall of Precautions/Restrictions: Impaired Precaution/Restrictions Comments: frequent cues to maintain NWB Restrictions Weight Bearing Restrictions Per Provider Order: Yes RLE Weight Bearing Per  Provider Order: Weight bearing as tolerated LLE Weight Bearing Per Provider Order: Weight bearing as tolerated      Mobility  Bed Mobility Overal bed mobility: Needs Assistance Bed Mobility: Supine to Sit     Supine to sit: +2 for physical assistance, +2 for safety/equipment, Max assist     General bed mobility comments: +2 assist and increased time to transition to EOB. Step-by-step cues and bed pad utilized for assist.    Transfers Overall transfer level: Needs assistance Equipment used: Rolling walker (2 wheels) Transfers: Sit to/from Stand, Bed to chair/wheelchair/BSC Sit to Stand: Mod assist, +2 physical assistance, +2 safety/equipment   Step pivot transfers: Mod assist, +2 physical assistance, +2 safety/equipment       General transfer comment: Step-by-step cues and +2 assist required for pivot transfer to Tammy Lindsey. Poor sequencing and initation of movement.    Ambulation/Gait               General Gait Details: Unable to progress to gait training at this time.  Stairs            Wheelchair Mobility     Tilt Bed    Modified Rankin (Stroke Patients Only)       Balance Overall balance assessment: Needs assistance Sitting-balance support: Feet supported Sitting balance-Leahy Scale: Fair Sitting balance - Comments: sitting EOB   Standing balance support: Bilateral upper extremity supported, During functional activity Standing balance-Leahy Scale: Poor Standing balance comment: RW and min A                             Pertinent Vitals/Pain Pain Assessment Pain Assessment: Faces Faces Pain Scale: Hurts little more Pain Location: pelvis  Pain Descriptors / Indicators: Grimacing, Discomfort, Aching, Sharp Pain Intervention(s): Limited activity within patient's tolerance, Monitored during session, Repositioned    Home Living Family/patient expects to be discharged to:: Skilled nursing facility                   Additional  Comments: plan to go back to [facility], SNF    Prior Function Prior Level of Function : Independent/Modified Independent;Driving             Mobility Comments: Prior to recent fall wtih pelvic fx: ambulates with RW; can ambulate in community ADLs Comments: Prior to recent fall wtih pelvic fx: independent adls and iadls; does her own driving and shopping     Extremity/Trunk Assessment   Upper Extremity Assessment Upper Extremity Assessment: Defer to OT evaluation    Lower Extremity Assessment Lower Extremity Assessment: Generalized weakness    Cervical / Trunk Assessment Cervical / Trunk Assessment: Kyphotic  Communication   Communication Communication: Impaired Factors Affecting Communication: Hearing impaired (hearing aids donned at the start of the session)    Cognition Arousal: Alert Behavior During Therapy: Flat affect   PT - Cognitive impairments: History of cognitive impairments                         Following commands: Impaired Following commands impaired: Follows one step commands with increased time     Cueing Cueing Techniques: Verbal cues     General Comments      Exercises     Assessment/Plan    PT Assessment Patient needs continued PT services  PT Problem List Decreased strength;Decreased range of motion;Decreased activity tolerance;Decreased knowledge of use of DME;Decreased balance;Decreased mobility       PT Treatment Interventions DME instruction;Therapeutic exercise;Gait training;Functional mobility training;Therapeutic activities;Patient/family education;Balance training;Modalities    PT Goals (Current goals can be found in the Care Plan section)  Acute Rehab PT Goals Patient Stated Goal: return to [facility], decrease pain PT Goal Formulation: With patient Time For Goal Achievement: 11/14/23 Potential to Achieve Goals: Good    Frequency Min 2X/week     Co-evaluation PT/OT/SLP Co-Evaluation/Treatment: Yes Reason  for Co-Treatment: Necessary to address cognition/behavior during functional activity;For patient/therapist safety;To address functional/ADL transfers PT goals addressed during session: Mobility/safety with mobility;Balance;Proper use of DME         AM-PAC PT 6 Clicks Mobility  Outcome Measure Help needed turning from your back to your side while in a flat bed without using bedrails?: A Lot Help needed moving from lying on your back to sitting on the side of a flat bed without using bedrails?: Total Help needed moving to and from a bed to a chair (including a wheelchair)?: Total Help needed standing up from a chair using your arms (e.g., wheelchair or bedside chair)?: Total Help needed to walk in hospital room?: Total Help needed climbing 3-5 steps with a railing? : Total 6 Click Score: 7    End of Session Equipment Utilized During Treatment: Gait belt Activity Tolerance: Patient tolerated treatment well Patient left: in bed;with call bell/phone within reach;with bed alarm set Nurse Communication: Mobility status PT Visit Diagnosis: Other abnormalities of gait and mobility (R26.89);Muscle weakness (generalized) (M62.81);Pain Pain - part of body: Hip    Time: 8944-8861 PT Time Calculation (min) (ACUTE ONLY): 43 min   Charges:   PT Evaluation $PT Eval Moderate Complexity: 1 Mod   PT General Charges $$ ACUTE PT VISIT: 1 Visit  Leita Sable, PT, DPT Acute Rehabilitation Services Secure Chat Preferred Office: [phone]   Leita JONETTA Sable 10/31/2023, 2:07 PM

## 2023-10-31 NOTE — Evaluation (Addendum)
 Occupational Therapy Evaluation Patient Details Name: TIYAH ZELENAK MRN: [id number] DOB: 17-Feb-1932 Today's Date: 10/31/2023   History of Present Illness   SHALI VESEY is a 89 y.o. female who returned to hospital from SNF due to increase in pelvic pain. Repeat imaging showed right ilium and hemisacrum fractures. She is now s/p closed reduction of pelvis with percutaneous fixation 7/7. PMHx: breast cancer, hypertension, depression, neuropathy recently hospitalized 6/15-6/19 with a mechanical fall and pelvic fracture and transferred to a skilled nursing facility     Clinical Impressions Reginae was evaluated s/p the above admission list. She is from [facility] and mod I at baseline, since recent fall with pelvic fractures she has been at [facility] with bed level care due to NWB status. Upon evaluation the pt was limited by pelvic pain, global weakness, impaired cognition, chronic L shoulder pain/limited ROM, unsteady balance and poor tolerance for activity. Overall she required max A +2 for bed mobility and mod A +2 for STS and pivotal stepping with RW and significant cues. Due to the deficits listed below the pt also needs up to total A for LB ADLs and min A for UB ADLs. Pt will benefit from continued acute OT services and skilled inpatient follow up therapy, <3 hours/day.      If plan is discharge home, recommend the following:   A lot of help with bathing/dressing/bathroom;A lot of help with walking and/or transfers;Assistance with cooking/housework;Direct supervision/assist for financial management;Direct supervision/assist for medications management;Assist for transportation;Help with stairs or ramp for entrance;Supervision due to cognitive status     Functional Status Assessment   Patient has had a recent decline in their functional status and demonstrates the ability to make significant improvements in function in a reasonable and predictable amount of time.     Equipment  Recommendations   None recommended by OT      Precautions/Restrictions   Precautions Precautions: Fall Recall of Precautions/Restrictions: Impaired Restrictions Weight Bearing Restrictions Per Provider Order: Yes RLE Weight Bearing Per Provider Order: Weight bearing as tolerated LLE Weight Bearing Per Provider Order: Weight bearing as tolerated     Mobility Bed Mobility Overal bed mobility: Needs Assistance Bed Mobility: Supine to Sit     Supine to sit: +2 for physical assistance, +2 for safety/equipment, Max assist     General bed mobility comments: step by step cues, HOB elevated    Transfers Overall transfer level: Needs assistance Equipment used: Rolling walker (2 wheels) Transfers: Sit to/from Stand, Bed to chair/wheelchair/BSC Sit to Stand: Mod assist, +2 physical assistance, +2 safety/equipment     Step pivot transfers: Mod assist, +2 physical assistance, +2 safety/equipment     General transfer comment: increased time and cues needed, poor RW management      Balance Overall balance assessment: Needs assistance Sitting-balance support: Feet supported Sitting balance-Leahy Scale: Fair     Standing balance support: Bilateral upper extremity supported, During functional activity Standing balance-Leahy Scale: Poor                             ADL either performed or assessed with clinical judgement   ADL Overall ADL's : Needs assistance/impaired Eating/Feeding: Set up;Sitting   Grooming: Set up;Sitting   Upper Body Bathing: Set up;Sitting   Lower Body Bathing: Total assistance;Sit to/from stand   Upper Body Dressing : Minimal assistance;Sitting   Lower Body Dressing: Total assistance;Sit to/from stand   Toilet Transfer: +2 for physical assistance;+2 for safety/equipment;Moderate  assistance;Stand-pivot;BSC/3in1;Rolling walker (2 wheels)   Toileting- Clothing Manipulation and Hygiene: Total assistance;Sit to/from stand        Functional mobility during ADLs: Moderate assistance;+2 for physical assistance;+2 for safety/equipment General ADL Comments: limited by BLE pain, poor activity tolerance, reliant on BUE support in standing and imapired cognition     Vision Baseline Vision/History: 1 Wears glasses Ability to See in Adequate Light: 0 Adequate Vision Assessment?: No apparent visual deficits     Perception Perception: Not tested       Praxis Praxis: Not tested       Pertinent Vitals/Pain Pain Assessment Pain Assessment: Faces Faces Pain Scale: Hurts little more Pain Location: pelvis Pain Descriptors / Indicators: Grimacing, Discomfort, Aching, Sharp Pain Intervention(s): Limited activity within patient's tolerance, Monitored during session     Extremity/Trunk Assessment Upper Extremity Assessment Upper Extremity Assessment: RUE deficits/detail;LUE deficits/detail RUE Deficits / Details: A/ROM WFL in all ranges. MMT: 4/5 shoulder, elbow ranges. functional gross grasp. LUE Deficits / Details: Hx of RTC repair with limited shoulder ROM. Pt able to demonstrate A/ROM shoulder flexion/abduction to shoulder level. Able to reach back of head with assistance from RUE for placement. 4/5 elbow flexion/extension, functional gross grasp.   Lower Extremity Assessment Lower Extremity Assessment: Generalized weakness   Cervical / Trunk Assessment Cervical / Trunk Assessment: Kyphotic   Communication Communication Communication: Impaired Factors Affecting Communication: Hearing impaired (hearing aids donned at the start of the session)   Cognition Arousal: Alert Behavior During Therapy: Flat affect Cognition: No family/caregiver present to determine baseline             OT - Cognition Comments: Oriented x4, HOH, slowed processing, required simple 1 step cues with increased time. While sitting on BSC pt stated, what am I supposed to be doing                 Following commands:  Impaired Following commands impaired: Follows one step commands with increased time     Cueing  General Comments   Cueing Techniques: Verbal cues  VSS on RA, pt had BM and urinated during this evaluation, RN made aware           Home Living Family/patient expects to be discharged to:: Skilled nursing facility               Additional Comments: plan to go back to [facility], SNF      Prior Functioning/Environment Prior Level of Function : Independent/Modified Independent;Driving             Mobility Comments: Prior to recent fall wtih pelvic fx: ambulates with RW; can ambulate in community ADLs Comments: Prior to recent fall wtih pelvic fx: independent adls and iadls; does her own driving and shopping    OT Problem List: Impaired balance (sitting and/or standing);Decreased activity tolerance;Decreased strength;Pain   OT Treatment/Interventions: Self-care/ADL training;Therapeutic exercise;DME and/or AE instruction;Therapeutic activities;Patient/family education;Balance training      OT Goals(Current goals can be found in the care plan section)   Acute Rehab OT Goals Patient Stated Goal: to get better OT Goal Formulation: With patient Time For Goal Achievement: 11/14/23 Potential to Achieve Goals: Fair ADL Goals Pt Will Perform Lower Body Dressing: with min assist;sit to/from stand Pt Will Transfer to Toilet: with contact guard assist;ambulating;bedside commode Additional ADL Goal #1: Pt will complete bed mobility with min A as a precursor to ADLs   OT Frequency:  Min 1X/week       AM-PAC OT 6 Clicks Daily Activity  Outcome Measure Help from another person eating meals?: A Little Help from another person taking care of personal grooming?: A Little Help from another person toileting, which includes using toliet, bedpan, or urinal?: Total Help from another person bathing (including washing, rinsing, drying)?: A Lot Help from another person to put on  and taking off regular upper body clothing?: A Little Help from another person to put on and taking off regular lower body clothing?: Total 6 Click Score: 13   End of Session Equipment Utilized During Treatment: Gait belt;Rolling walker (2 wheels) Nurse Communication: Mobility status (needs new sacral dressing)  Activity Tolerance: Patient limited by pain Patient left: in chair;with call bell/phone within reach;with chair alarm set  OT Visit Diagnosis: Muscle weakness (generalized) (M62.81);History of falling (Z91.81);Pain Pain - Right/Left: Right Pain - part of body: Leg;Hip                Time: 8944-8862 OT Time Calculation (min): 42 min Charges:  OT General Charges $OT Visit: 1 Visit OT Evaluation $OT Eval Moderate Complexity: 1 Mod OT Treatments $Self Care/Home Management : 8-22 mins  Lucie Kendall, OTR/L Acute Rehabilitation Services Office [phone] Secure Chat Communication Preferred   Lucie JONETTA Kendall 10/31/2023, 12:05 PM

## 2023-10-31 NOTE — Progress Notes (Addendum)
 Orthopaedic Trauma Progress Note  SUBJECTIVE: Laying in bed crying. Reports moderate, severe pain in the pelvis this morning, equal on both the right and the left side.  Denies any pain that radiates down into the legs.  Denies any numbness or tingling to the bilateral lower extremities.  Last dose of pain medication was around 10:45 PM last night.  Due for medication now, have contacted nursing.  Patient has not been out of bed yet since surgery.  No chest pain. No SOB. No nausea/vomiting. No other complaints.  No family at bedside currently  OBJECTIVE:  Vitals:   10/31/23 0511 10/31/23 0743  BP: (!) 148/65 (!) 144/73  Pulse: 77 76  Resp: 17 14  Temp: 97.9 F (36.6 C) (!) 97.4 F (36.3 C)  SpO2: 100% 100%    Opiates Today (MME): Today's  total administered Morphine  Milligram Equivalents: 0 Opiates Yesterday (MME): Yesterday's total administered Morphine  Milligram Equivalents: 62.5  General: Laying in bed, appears uncomfortable but in no acute distress. Respiratory: No increased work of breathing.  Operative Extremity (pelvis/RLE): Dressings clean, dry, intact.  Some tenderness with palpation over the posterior lateral hip but otherwise no significant tenderness throughout the extremity.  Tolerates gentle ankle range of motion.  Able to wiggle toes.  Dorsal sensation light touch over all aspects of the foot. + DP pulse  IMAGING: Stable post op imaging of the pelvis.   LABS:  Results for orders placed or performed during the hospital encounter of 10/25/23 (from the past 24 hours)  Basic metabolic panel     Status: Abnormal   Collection Time: 10/31/23  7:03 AM  Result Value Ref Range   Sodium 132 (L) 135 - 145 mmol/L   Potassium 3.9 3.5 - 5.1 mmol/L   Chloride 100 98 - 111 mmol/L   CO2 23 22 - 32 mmol/L   Glucose, Bld 117 (H) 70 - 99 mg/dL   BUN 13 8 - 23 mg/dL   Creatinine, Ser 9.46 0.44 - 1.00 mg/dL   Calcium  8.4 (L) 8.9 - 10.3 mg/dL   GFR, Estimated >39 >39 mL/min   Anion gap  9 5 - 15  CBC     Status: Abnormal   Collection Time: 10/31/23  7:03 AM  Result Value Ref Range   WBC 9.6 4.0 - 10.5 K/uL   RBC 3.79 (L) 3.87 - 5.11 MIL/uL   Hemoglobin 10.4 (L) 12.0 - 15.0 g/dL   HCT 68.6 (L) 63.9 - 53.9 %   MCV 82.6 80.0 - 100.0 fL   MCH 27.4 26.0 - 34.0 pg   MCHC 33.2 30.0 - 36.0 g/dL   RDW 86.6 88.4 - 84.4 %   Platelets 395 150 - 400 K/uL   nRBC 0.0 0.0 - 0.2 %    ASSESSMENT: Tammy Lindsey is a 89 y.o. female, 1 Day Post-Op s/p fall Procedures: CLOSED REDUCTION, PELVIS, WITH PERCUTANEOUS FIXATION  CV/Blood loss: Acute blood loss anemia, Hgb 10.4 this AM. Hemodynamically stable  PLAN: Weightbearing: WBAT RLE and LLE ROM: Unrestricted ROM Incisional and dressing care: Reinforce dressings as needed  Showering: Okay to begin getting incisions wet starting 11/02/2023 Orthopedic device(s): None  Pain management: Continue multimodal pain control VTE prophylaxis: Lovenox , SCDs ID:  Ancef  2gm post op Foley/Lines:  No foley, KVO IVFs Impediments to Fracture Healing: Vitamin D  level 29 when checked in June 2025, continue supplementation Dispo: PT/OT evaluation today, anticipate patient will need a SNF.  Work on pain control today.  If pain continues to be  an issue, could schedule oxycodone  dosing.  Plan to remove dressings from the pelvis tomorrow 11/01/2023.    D/C recommendations: - Oxycodone , Tylenol  for pain control - Aspirin  x 30 days for DVT prophylaxis - Continue 1000 units Vit D supplementation daily  Follow - up plan: 2 weeks after d/c for wound check and repeat x-rays   Contact information:  Franky Light MD, Lauraine Moores PA-C. After hours and holidays please check [email or web address] for group call information for [facility]   Lauraine PATRIC Moores, PA-C [phone] (office) [email or web address]

## 2023-10-31 NOTE — Progress Notes (Signed)
 Orthopedics Progress Note  Subjective: Severe pain this morning per PA McClung note. Patient is currently sleeping due to meds in a chair.  Objective:  Vitals:   10/31/23 0511 10/31/23 0743  BP: (!) 148/65 (!) 144/73  Pulse: 77 76  Resp: 17 14  Temp: 97.9 F (36.6 C) (!) 97.4 F (36.3 C)  SpO2: 100% 100%    General: Asleep Musculoskeletal: legs well perfused Neurovascularly intact grossly  Lab Results  Component Value Date   WBC 9.6 10/31/2023   HGB 10.4 (L) 10/31/2023   HCT 31.3 (L) 10/31/2023   MCV 82.6 10/31/2023   PLT 395 10/31/2023       Component Value Date/Time   NA 132 (L) 10/31/2023 0703   NA 133 (A) 10/16/2023 0000   K 3.9 10/31/2023 0703   CL 100 10/31/2023 0703   CO2 23 10/31/2023 0703   GLUCOSE 117 (H) 10/31/2023 0703   BUN 13 10/31/2023 0703   BUN 15 10/16/2023 0000   CREATININE 0.53 10/31/2023 0703   CREATININE 0.68 11/24/2021 0818   CALCIUM  8.4 (L) 10/31/2023 0703   GFRNONAA >60 10/31/2023 0703   GFRNONAA >60 11/24/2021 0818   GFRAA  07/24/2010 0600    >60        The eGFR has been calculated using the MDRD equation. This calculation has not been validated in all clinical situations. eGFR's persistently <60 mL/min signify possible Chronic Kidney Disease.    Lab Results  Component Value Date   INR 1.1 10/08/2023   INR 0.97 07/15/2010    Assessment/Plan: POD #1 s/p Procedure(s): CLOSED REDUCTION, PELVIS, WITH PERCUTANEOUS FIXATION Appreciate excellent care from ortho trauma team. Care recommendations per Dr Kendal and Lauraine Moores PA-C. DVT prophylaxis PT and mobilization Will sign off from general ortho  Elspeth SAUNDERS. Kay, MD 10/31/2023 12:48 PM

## 2023-10-31 NOTE — Progress Notes (Signed)
 PROGRESS NOTE    Tammy Lindsey  FMW:[id number] DOB: 05-23-1931 DOA: 10/25/2023 PCP: Charlanne Fredia CROME, MD    Brief Narrative:  89 year old with history of breast cancer, hypertension, depression, neuropathy recently hospitalized 6/15-6/19 with a mechanical fall and pelvic fracture and transferred to a skilled nursing facility had to come back to the hospital with intractable pain associated with the fracture.  Repeat imaging showed right ilium and hemisacrum fractures.  Due to ongoing pain, patient was admitted to the hospital with plan for surgical stabilization.  Subjective:  Patient seen and examined.  Slightly confused.  Reported a lot of pain earlier today but now relieved with pain medication.  Daughter at the bedside.   Assessment & Plan:   Uncontrolled pain secondary to right pubic rami fracture, right ilium fracture and right hemisacrum fracture secondary to mechanical fall: Failed conservative management.  Underwent stabilization with percutaneous screws 7/7. Adequate pain management.  Opiate and nonopiates.  Oxycodone , lidocaine , Valium  and Robaxin .  As needed IV opiates for pain relief. Aggressive bowel regimen. Weightbearing as tolerated. Lovenox  for DVT prophylaxis. Mobilize with PT OT.  Referred to a skilled nursing facility.  Ankle injury: Mechanical injury to the right ankle.  Weightbearing as tolerated.  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  Previously on sodium chloride  tablets.  Sodium 130.  Stable.  Essential hypertension, blood pressure stable on amlodipine  and atenolol .  Depression: On Remeron .   DVT prophylaxis: SCDs Start: 10/30/23 1502 enoxaparin  (LOVENOX ) injection 40 mg Start: 10/25/23 2200 Place and maintain sequential compression device Start: 10/25/23 1923   Code Status: DNR with limited intervention Family Communication: Daughter at the bedside Disposition Plan: Status is: Inpatient Remains inpatient appropriate because: Immediate postop.      Consultants:  Orthopedics  Procedures:  None  Antimicrobials:  None     Objective: Vitals:   10/30/23 1801 10/30/23 2021 10/31/23 0511 10/31/23 0743  BP: 125/61 121/66 (!) 148/65 (!) 144/73  Pulse: 93 93 77 76  Resp: 17 17 17 14   Temp: 98.2 F (36.8 C) (!) 97.5 F (36.4 C) 97.9 F (36.6 C) (!) 97.4 F (36.3 C)  TempSrc: Oral Oral Oral Oral  SpO2: 98% 94% 100% 100%  Weight:      Height:        Intake/Output Summary (Last 24 hours) at 10/31/2023 1308 Last data filed at 10/31/2023 0600 Gross per 24 hour  Intake 1561.18 ml  Output 175 ml  Net 1386.18 ml   Filed Weights   10/25/23 1110  Weight: 52.1 kg    Examination:  General exam: Appeared comfortable on my exam. Respiratory system: Clear to auscultation. Respiratory effort normal. Cardiovascular system: S1 & S2 heard, RRR. Gastrointestinal system: Soft and nontender.  Bowel sound present. Central nervous system: Alert and awake.  Mostly oriented.    Data Reviewed: I have personally reviewed following labs and imaging studies  CBC: Recent Labs  Lab 10/25/23 1215 10/26/23 0929 10/27/23 0421 10/29/23 0526 10/31/23 0703  WBC 10.9* 7.2 7.6 8.7 9.6  NEUTROABS  --  5.6 5.7 6.4  --   HGB 12.0 10.3* 11.6* 11.1* 10.4*  HCT 35.7* 30.9* 35.3* 32.2* 31.3*  MCV 82.3 84.0 82.7 81.7 82.6  PLT 437* 394 446* 437* 395   Basic Metabolic Panel: Recent Labs  Lab 10/26/23 1302 10/27/23 0421 10/28/23 0406 10/29/23 0526 10/31/23 0703  NA 128* 130* 131* 130* 132*  K 4.1 3.9 4.3 4.6 3.9  CL 96* 95* 96* 96* 100  CO2 23 23 23 25  23  GLUCOSE 125* 110* 102* 101* 117*  BUN 14 11 10 9 13   CREATININE 0.53 0.54 0.53 0.55 0.53  CALCIUM  8.0* 8.7* 8.9 8.6* 8.4*   GFR: Estimated Creatinine Clearance: 37 mL/min (by C-G formula based on SCr of 0.53 mg/dL). Liver Function Tests: No results for input(s): AST, ALT, ALKPHOS, BILITOT, PROT, ALBUMIN in the last 168 hours. No results for input(s): LIPASE,  AMYLASE in the last 168 hours. No results for input(s): AMMONIA in the last 168 hours. Coagulation Profile: No results for input(s): INR, PROTIME in the last 168 hours. Cardiac Enzymes: Recent Labs  Lab 10/27/23 1510  CKTOTAL 25*   BNP (last 3 results) No results for input(s): PROBNP in the last 8760 hours. HbA1C: No results for input(s): HGBA1C in the last 72 hours. CBG: No results for input(s): GLUCAP in the last 168 hours. Lipid Profile: No results for input(s): CHOL, HDL, LDLCALC, TRIG, CHOLHDL, LDLDIRECT in the last 72 hours. Thyroid  Function Tests: No results for input(s): TSH, T4TOTAL, FREET4, T3FREE, THYROIDAB in the last 72 hours. Anemia Panel: No results for input(s): VITAMINB12, FOLATE, FERRITIN, TIBC, IRON, RETICCTPCT in the last 72 hours. Sepsis Labs: No results for input(s): PROCALCITON, LATICACIDVEN in the last 168 hours.  Recent Results (from the past 240 hours)  Surgical PCR screen     Status: None   Collection Time: 10/29/23  9:18 PM   Specimen: Nasal Mucosa; Nasal Swab  Result Value Ref Range Status   MRSA, PCR NEGATIVE NEGATIVE Final   Staphylococcus aureus NEGATIVE NEGATIVE Final    Comment: (NOTE) The Xpert SA Assay (FDA approved for NASAL specimens in patients 17 years of age and older), is one component of a comprehensive surveillance program. It is not intended to diagnose infection nor to guide or monitor treatment. Performed at [facility], [address], [place], [place] [address]          Radiology Studies: DG Pelvis Comp Min 3V Result Date: 10/30/2023 CLINICAL DATA:  Pelvic fracture. EXAM: JUDET PELVIS - 3+ VIEW COMPARISON:  Preoperative imaging FINDINGS: Screw traverses the sacroiliac joints and sacrum, sacral and iliac fractures on prior CT are not well demonstrated. Screw traverses the right superior pubic ramus fracture. Unchanged fracture alignment. Displaced right inferior  ramus fractures again seen. IMPRESSION: 1. Screw traverses the sacroiliac joints and sacrum, sacral and iliac fractures on prior CT are not well demonstrated. 2. Screw traverses the right superior pubic ramus fracture. Unchanged fracture alignment. Electronically Signed   By: Andrea Gasman M.D.   On: 10/30/2023 15:09   DG Pelvis Comp Min 3V Result Date: 10/30/2023 CLINICAL DATA:  Elective surgery, closed reduction with percutaneous fixation. EXAM: JUDET PELVIS - 3+ VIEW COMPARISON:  Radiograph 10/25/2023 FINDINGS: Fifteen intraoperative spot views of the pelvis submitted from the operating room. A screw traverses the sacroiliac joints. Screw traverses right superior ramus fracture. Fluoroscopy time 1:03 0.2 seconds. Dose 10.72 mGy. IMPRESSION: Intraoperative spot views during pelvic fracture fixation. Electronically Signed   By: Andrea Gasman M.D.   On: 10/30/2023 14:40   DG C-Arm 1-60 Min-No Report Result Date: 10/30/2023 Fluoroscopy was utilized by the requesting physician.  No radiographic interpretation.        Scheduled Meds:  acetaminophen   650 mg Oral Q6H   Or   acetaminophen   650 mg Rectal Q6H   amLODipine   2.5 mg Oral Daily   atenolol   25 mg Oral QHS   cholecalciferol   1,000 Units Oral Daily   cyanocobalamin   1,000 mcg  Oral Daily   diazepam   2.5 mg Intravenous Q6H   docusate sodium   100 mg Oral BID   enoxaparin  (LOVENOX ) injection  40 mg Subcutaneous QHS   folic acid   1 mg Oral QHS   lidocaine   2 patch Transdermal Q24H   methocarbamol  (ROBAXIN ) injection  500 mg Intravenous Q6H   mirtazapine   7.5 mg Oral QHS   pantoprazole   40 mg Oral Daily   polyethylene glycol  17 g Oral BID   Continuous Infusions:     LOS: 6 days    Time spent: 35 minutes    Renato Applebaum, MD [facility]

## 2023-11-01 ENCOUNTER — Encounter (HOSPITAL_COMMUNITY): Payer: Self-pay | Admitting: Student

## 2023-11-01 DIAGNOSIS — S32501A Unspecified fracture of right pubis, initial encounter for closed fracture: Secondary | ICD-10-CM | POA: Diagnosis not present

## 2023-11-01 LAB — CBC
HCT: 31.7 % — ABNORMAL LOW (ref 36.0–46.0)
Hemoglobin: 10.5 g/dL — ABNORMAL LOW (ref 12.0–15.0)
MCH: 27.2 pg (ref 26.0–34.0)
MCHC: 33.1 g/dL (ref 30.0–36.0)
MCV: 82.1 fL (ref 80.0–100.0)
Platelets: 340 K/uL (ref 150–400)
RBC: 3.86 MIL/uL — ABNORMAL LOW (ref 3.87–5.11)
RDW: 13.7 % (ref 11.5–15.5)
WBC: 8.4 K/uL (ref 4.0–10.5)
nRBC: 0 % (ref 0.0–0.2)

## 2023-11-01 MED ORDER — OXYCODONE HCL 5 MG PO TABS: 2.5000 mg | ORAL_TABLET | ORAL | 0 refills | Status: DC | PRN

## 2023-11-01 MED ORDER — ASPIRIN 325 MG PO TBEC
325.0000 mg | DELAYED_RELEASE_TABLET | Freq: Every day | ORAL | 0 refills | Status: AC
Start: 2023-11-01 — End: 2023-12-01

## 2023-11-01 MED ORDER — METHOCARBAMOL 500 MG PO TABS: 500.0000 mg | ORAL_TABLET | Freq: Four times a day (QID) | ORAL | Status: DC | PRN

## 2023-11-01 MED ORDER — METHOCARBAMOL 500 MG PO TABS
500.0000 mg | ORAL_TABLET | Freq: Four times a day (QID) | ORAL | Status: DC
  Administered 2023-11-01 – 2023-11-03 (×8): 500 mg via ORAL
  Filled 2023-11-01 (×8): qty 1

## 2023-11-01 MED ORDER — DIAZEPAM 2 MG PO TABS
2.0000 mg | ORAL_TABLET | Freq: Four times a day (QID) | ORAL | Status: DC
  Administered 2023-11-01 – 2023-11-03 (×6): 2 mg via ORAL
  Filled 2023-11-01 (×8): qty 1

## 2023-11-01 MED ORDER — ACETAMINOPHEN 500 MG PO TABS
1000.0000 mg | ORAL_TABLET | Freq: Four times a day (QID) | ORAL | Status: DC
  Administered 2023-11-01 – 2023-11-03 (×7): 1000 mg via ORAL
  Filled 2023-11-01 (×7): qty 2

## 2023-11-01 NOTE — Discharge Instructions (Signed)
 Orthopaedic Trauma Service Discharge Instructions   General Discharge Instructions  WEIGHT BEARING STATUS:weightbearing as tolerated bilateral lower extremities  RANGE OF MOTION/ACTIVITY: Ok for unrestricted hip and knee motion  Wound Care: Incisions can be left open to air if there is no drainage. Once the incision is completely dry and without drainage, it may be left open to air out.  Showering may begin post op day 3 (Thursday 11/02/23).  Clean incision gently with soap and water.  DVT/PE prophylaxis: Aspirin  325 mg daily x 30 days  Diet: as you were eating previously.  Can use over the counter stool softeners and bowel preparations, such as Miralax , to help with bowel movements.  Narcotics can be constipating.  Be sure to drink plenty of fluids  PAIN MEDICATION USE AND EXPECTATIONS  You have likely been given narcotic medications to help control your pain.  After a traumatic event that results in an fracture (broken bone) with or without surgery, it is ok to use narcotic pain medications to help control one's pain.  We understand that everyone responds to pain differently and each individual patient will be evaluated on a regular basis for the continued need for narcotic medications. Ideally, narcotic medication use should last no more than 6-8 weeks (coinciding with fracture healing).   As a patient it is your responsibility as well to monitor narcotic medication use and report the amount and frequency you use these medications when you come to your office visit.   We would also advise that if you are using narcotic medications, you should take a dose prior to therapy to maximize you participation.  IF YOU ARE ON NARCOTIC MEDICATIONS IT IS NOT PERMISSIBLE TO OPERATE A MOTOR VEHICLE (MOTORCYCLE/CAR/TRUCK/MOPED) OR HEAVY MACHINERY DO NOT MIX NARCOTICS WITH OTHER CNS (CENTRAL NERVOUS SYSTEM) DEPRESSANTS SUCH AS ALCOHOL  POST-OPERATIVE OPIOID TAPER INSTRUCTIONS: It is important to wean  off of your opioid medication as soon as possible. If you do not need pain medication after your surgery it is ok to stop day one. Opioids include: Codeine, Hydrocodone (Norco, Vicodin), Oxycodone (Percocet, oxycontin ) and hydromorphone  amongst others.  Long term and even short term use of opiods can cause: Increased pain response Dependence Constipation Depression Respiratory depression And more.  Withdrawal symptoms can include Flu like symptoms Nausea, vomiting And more Techniques to manage these symptoms Hydrate well Eat regular healthy meals Stay active Use relaxation techniques(deep breathing, meditating, yoga) Do Not substitute Alcohol to help with tapering If you have been on opioids for less than two weeks and do not have pain than it is ok to stop all together.  Plan to wean off of opioids This plan should start within one week post op of your fracture surgery  Maintain the same interval or time between taking each dose and first decrease the dose.  Cut the total daily intake of opioids by one tablet each day Next start to increase the time between doses. The last dose that should be eliminated is the evening dose.    STOP SMOKING OR USING NICOTINE PRODUCTS!!!!  As discussed nicotine severely impairs your body's ability to heal surgical and traumatic wounds but also impairs bone healing.  Wounds and bone heal by forming microscopic blood vessels (angiogenesis) and nicotine is a vasoconstrictor (essentially, shrinks blood vessels).  Therefore, if vasoconstriction occurs to these microscopic blood vessels they essentially disappear and are unable to deliver necessary nutrients to the healing tissue.  This is one modifiable factor that you can do to dramatically increase your  chances of healing your injury.  (This means no smoking, no nicotine gum, patches, etc)  DO NOT USE NONSTEROIDAL ANTI-INFLAMMATORY DRUGS (NSAID'S)  Using products such as Advil (ibuprofen), Aleve  (naproxen), Motrin (ibuprofen) for additional pain control during fracture healing can delay and/or prevent the healing response.  If you would like to take over the counter (OTC) medication, Tylenol  (acetaminophen ) is ok.  However, some narcotic medications that are given for pain control contain acetaminophen  as well. Therefore, you should not exceed more than 4000 mg of tylenol  in a day if you do not have liver disease.  Also note that there are may OTC medicines, such as cold medicines and allergy medicines that my contain tylenol  as well.  If you have any questions about medications and/or interactions please ask your doctor/PA or your pharmacist.      ICE AND ELEVATE INJURED/OPERATIVE EXTREMITY  Using ice and elevating the injured extremity above your heart can help with swelling and pain control.  Icing in a pulsatile fashion, such as 20 minutes on and 20 minutes off, can be followed.    Do not place ice directly on skin. Make sure there is a barrier between to skin and the ice pack.    Using frozen items such as frozen peas works well as the conform nicely to the are that needs to be iced.  USE AN ACE WRAP OR TED HOSE FOR SWELLING CONTROL  In addition to icing and elevation, Ace wraps or TED hose are used to help limit and resolve swelling.  It is recommended to use Ace wraps or TED hose until you are informed to stop.    When using Ace Wraps start the wrapping distally (farthest away from the body) and wrap proximally (closer to the body)   Example: If you had surgery on your leg or thing and you do not have a splint on, start the ace wrap at the toes and work your way up to the thigh        If you had surgery on your upper extremity and do not have a splint on, start the ace wrap at your fingers and work your way up to the upper arm   CALL THE OFFICE FOR MEDICATION REFILLS OR WITH ANY QUESTIONS/CONCERNS: [phone]   VISIT OUR WEBSITE FOR ADDITIONAL INFORMATION:  [email or web address]    Discharge Wound Care Instructions  Do NOT apply any ointments, solutions or lotions to pin sites or surgical wounds.  These prevent needed drainage and even though solutions like hydrogen peroxide kill bacteria, they also damage cells lining the pin sites that help fight infection.  Applying lotions or ointments can keep the wounds moist and can cause them to breakdown and open up as well. This can increase the risk for infection. When in doubt call the office.  If any drainage is noted, use foam dressings - These dressing supplies should be available at local medical supply stores [facility], [facility], etc) as well as Insurance claims handler ([facility], [facility], [employer], etc)  Once the incision is completely dry and without drainage, it may be left open to air out.  Showering may begin 36-48 hours later.  Cleaning gently with soap and water.   Call office for the following: Temperature greater than 101F Persistent nausea and vomiting Severe uncontrolled pain Redness, tenderness, or signs of infection (pain, swelling, redness, odor or green/yellow discharge around the site) Difficulty breathing, headache or visual disturbances Hives Persistent dizziness or light-headedness Extreme fatigue Any other questions or  concerns you may have after discharge  In an emergency, call 911 or go to an Emergency Department at a nearby hospital  OTHER HELPFUL INFORMATION  If you had a block, it will wear off between 8-24 hrs postop typically.  This is period when your pain may go from nearly zero to the pain you would have had postop without the block.  This is an abrupt transition but nothing dangerous is happening.  You may take an extra dose of narcotic when this happens.  You should wean off your narcotic medicines as soon as you are able.  Most patients will be off or using minimal narcotics before their first postop appointment.   We suggest you use the pain medication the  first night prior to going to bed, in order to ease any pain when the anesthesia wears off. You should avoid taking pain medications on an empty stomach as it will make you nauseous.  Do not drink alcoholic beverages or take illicit drugs when taking pain medications.  In most states it is against the law to drive while you are in a splint or sling.  And certainly against the law to drive while taking narcotics.  You may return to work/school in the next couple of days when you feel up to it.   Pain medication may make you constipated.  Below are a few solutions to try in this order: Decrease the amount of pain medication if you aren't having pain. Drink lots of decaffeinated fluids. Drink prune juice and/or each dried prunes  If the first 3 don't work start with additional solutions Take Colace - an over-the-counter stool softener Take Senokot - an over-the-counter laxative Take Miralax  - a stronger over-the-counter laxative

## 2023-11-01 NOTE — Plan of Care (Signed)
  Problem: Nutrition: Goal: Adequate nutrition will be maintained Outcome: Progressing   Problem: Coping: Goal: Level of anxiety will decrease Outcome: Progressing   Problem: Elimination: Goal: Will not experience complications related to bowel motility Outcome: Progressing   Problem: Pain Managment: Goal: General experience of comfort will improve and/or be controlled Outcome: Progressing   Problem: Safety: Goal: Ability to remain free from injury will improve Outcome: Progressing   Problem: Skin Integrity: Goal: Risk for impaired skin integrity will decrease Outcome: Progressing

## 2023-11-01 NOTE — Progress Notes (Signed)
 PROGRESS NOTE    Tammy Lindsey  FMW:[id number] DOB: [birth year] DOA: 10/25/2023 PCP: Charlanne Fredia CROME, MD    Brief Narrative:  89 year old with history of breast cancer, hypertension, depression, neuropathy recently hospitalized 6/15-6/19 with a mechanical fall and pelvic fracture and transferred to a skilled nursing facility had to come back to the hospital with intractable pain associated with the fracture.  Repeat imaging showed right ilium and hemisacrum fractures.  Due to ongoing pain, patient was admitted to the hospital with plan for surgical stabilization.  Subjective:  Patient seen and examined along with orthopedics team.  She continues to have discomfort, burning pain on the back as well as left groin pain and thigh spasms.  Difficult to achieve pain control without sedating.  She is on a scheduled Robaxin  and Valium .  Oral oxycodone .  Patient was able to keep up conversation.  She tells me that she was only able to get to the chair and it was difficult.  Her daughter was at the bedside.   Assessment & Plan:   Uncontrolled pain secondary to right pubic rami fracture, right ilium fracture and right hemisacrum fracture secondary to mechanical fall: Failed conservative management.  Underwent stabilization with percutaneous screws 7/7. Adequate pain management.  Opiate and nonopiates.  Oxycodone , lidocaine , scheduled Valium  and Robaxin .  Will change to oral Valium  and Robaxin .  As needed IV opiates for pain relief. Aggressive bowel regimen. Weightbearing as tolerated. Lovenox  for DVT prophylaxis. Mobilize with PT OT.  Referred to a skilled nursing facility.  Ankle injury: Mechanical injury to the right ankle.  Weightbearing as tolerated.  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  Previously on sodium chloride  tablets.  Sodium 132.  Stable.  Essential hypertension, blood pressure stable on amlodipine  and atenolol .  Depression: On Remeron .   DVT prophylaxis: SCDs Start: 10/30/23  1502 enoxaparin  (LOVENOX ) injection 40 mg Start: 10/25/23 2200   Code Status: DNR with limited intervention Family Communication: Daughter at the bedside Disposition Plan: Status is: Inpatient Remains inpatient appropriate because: Immediate postop.     Consultants:  Orthopedics  Procedures:  None  Antimicrobials:  None     Objective: Vitals:   10/31/23 2040 11/01/23 0514 11/01/23 0815 11/01/23 0854  BP: (!) 108/50 107/61 (!) 147/62 (!) 147/62  Pulse: 79 74 72   Resp: 20 16 16    Temp: 97.6 F (36.4 C) 97.8 F (36.6 C) 97.9 F (36.6 C)   TempSrc: Axillary Axillary Oral   SpO2: 96% 98% 98%   Weight:      Height:        Intake/Output Summary (Last 24 hours) at 11/01/2023 1349 Last data filed at 11/01/2023 0800 Gross per 24 hour  Intake 118 ml  Output 1300 ml  Net -1182 ml   Filed Weights   10/25/23 1110  Weight: 52.1 kg    Examination:  General exam: Mildly anxious.  In mild distress due to pain. Respiratory system: Clear to auscultation. Respiratory effort normal. Cardiovascular system: S1 & S2 heard, RRR. Gastrointestinal system: Soft and nontender.  Bowel sound present. Central nervous system: Alert and awake.  Alert awake and oriented.    Data Reviewed: I have personally reviewed following labs and imaging studies  CBC: Recent Labs  Lab 10/26/23 0929 10/27/23 0421 10/29/23 0526 10/31/23 0703 11/01/23 0605  WBC 7.2 7.6 8.7 9.6 8.4  NEUTROABS 5.6 5.7 6.4  --   --   HGB 10.3* 11.6* 11.1* 10.4* 10.5*  HCT 30.9* 35.3* 32.2* 31.3* 31.7*  MCV 84.0 82.7  81.7 82.6 82.1  PLT 394 446* 437* 395 340   Basic Metabolic Panel: Recent Labs  Lab 10/26/23 1302 10/27/23 0421 10/28/23 0406 10/29/23 0526 10/31/23 0703  NA 128* 130* 131* 130* 132*  K 4.1 3.9 4.3 4.6 3.9  CL 96* 95* 96* 96* 100  CO2 23 23 23 25 23   GLUCOSE 125* 110* 102* 101* 117*  BUN 14 11 10 9 13   CREATININE 0.53 0.54 0.53 0.55 0.53  CALCIUM  8.0* 8.7* 8.9 8.6* 8.4*    GFR: Estimated Creatinine Clearance: 37 mL/min (by C-G formula based on SCr of 0.53 mg/dL). Liver Function Tests: No results for input(s): AST, ALT, ALKPHOS, BILITOT, PROT, ALBUMIN in the last 168 hours. No results for input(s): LIPASE, AMYLASE in the last 168 hours. No results for input(s): AMMONIA in the last 168 hours. Coagulation Profile: No results for input(s): INR, PROTIME in the last 168 hours. Cardiac Enzymes: Recent Labs  Lab 10/27/23 1510  CKTOTAL 25*   BNP (last 3 results) No results for input(s): PROBNP in the last 8760 hours. HbA1C: No results for input(s): HGBA1C in the last 72 hours. CBG: No results for input(s): GLUCAP in the last 168 hours. Lipid Profile: No results for input(s): CHOL, HDL, LDLCALC, TRIG, CHOLHDL, LDLDIRECT in the last 72 hours. Thyroid  Function Tests: No results for input(s): TSH, T4TOTAL, FREET4, T3FREE, THYROIDAB in the last 72 hours. Anemia Panel: No results for input(s): VITAMINB12, FOLATE, FERRITIN, TIBC, IRON, RETICCTPCT in the last 72 hours. Sepsis Labs: No results for input(s): PROCALCITON, LATICACIDVEN in the last 168 hours.  Recent Results (from the past 240 hours)  Surgical PCR screen     Status: None   Collection Time: 10/29/23  9:18 PM   Specimen: Nasal Mucosa; Nasal Swab  Result Value Ref Range Status   MRSA, PCR NEGATIVE NEGATIVE Final   Staphylococcus aureus NEGATIVE NEGATIVE Final    Comment: (NOTE) The Xpert SA Assay (FDA approved for NASAL specimens in patients 29 years of age and older), is one component of a comprehensive surveillance program. It is not intended to diagnose infection nor to guide or monitor treatment. Performed at [facility], [address] [place], [place] [address]          Radiology Studies: DG Pelvis Comp Min 3V Result Date: 10/30/2023 CLINICAL DATA:  Pelvic fracture. EXAM: JUDET PELVIS - 3+ VIEW COMPARISON:   Preoperative imaging FINDINGS: Screw traverses the sacroiliac joints and sacrum, sacral and iliac fractures on prior CT are not well demonstrated. Screw traverses the right superior pubic ramus fracture. Unchanged fracture alignment. Displaced right inferior ramus fractures again seen. IMPRESSION: 1. Screw traverses the sacroiliac joints and sacrum, sacral and iliac fractures on prior CT are not well demonstrated. 2. Screw traverses the right superior pubic ramus fracture. Unchanged fracture alignment. Electronically Signed   By: Andrea Gasman M.D.   On: 10/30/2023 15:09   DG Pelvis Comp Min 3V Result Date: 10/30/2023 CLINICAL DATA:  Elective surgery, closed reduction with percutaneous fixation. EXAM: JUDET PELVIS - 3+ VIEW COMPARISON:  Radiograph 10/25/2023 FINDINGS: Fifteen intraoperative spot views of the pelvis submitted from the operating room. A screw traverses the sacroiliac joints. Screw traverses right superior ramus fracture. Fluoroscopy time 1:03 0.2 seconds. Dose 10.72 mGy. IMPRESSION: Intraoperative spot views during pelvic fracture fixation. Electronically Signed   By: Andrea Gasman M.D.   On: 10/30/2023 14:40   DG C-Arm 1-60 Min-No Report Result Date: 10/30/2023 Fluoroscopy was utilized by the requesting physician.  No radiographic  interpretation.        Scheduled Meds:  acetaminophen   1,000 mg Oral Q6H   amLODipine   2.5 mg Oral Daily   atenolol   25 mg Oral QHS   cholecalciferol   1,000 Units Oral Daily   cyanocobalamin   1,000 mcg Oral Daily   diazepam   2 mg Oral Q6H   docusate sodium   100 mg Oral BID   enoxaparin  (LOVENOX ) injection  40 mg Subcutaneous QHS   folic acid   1 mg Oral QHS   lidocaine   2 patch Transdermal Q24H   methocarbamol   500 mg Oral QID   mirtazapine   7.5 mg Oral QHS   pantoprazole   40 mg Oral Daily   polyethylene glycol  17 g Oral BID   Continuous Infusions:     LOS: 7 days    Time spent: 35 minutes    Renato Applebaum, MD [facility]

## 2023-11-01 NOTE — Progress Notes (Cosign Needed Addendum)
 Orthopaedic Trauma Progress Note  SUBJECTIVE: Better pain control overall today but still notes burning type pain in the left sided posterior pelvis that radiates down the left leg. Denies any radiating pain through the right leg. Still having muscle spasms although less frequent from pre-op. Has not required oxycodone  since about 10:45AM on 10/31/23. Receiving scheduled Robaxin  and Tylenol .  Patient's daughter is at bedside helping patient with her breakfast. We discussed that the radiating pain down her left leg is most likely muscular in nature as patient had been laying in bed for multiple days before surgery. I anticipate that this will continue to improve. I did briefly mention to the daughter that screw placement in the posterior pelvis can sometimes cause nerve irritation, especially given patient's thin frame but this is less likely the cause of her pain. I anticipate that as the patient becomes more mobile she will become less symptomatic.  OBJECTIVE:  Vitals:   11/01/23 0514 11/01/23 0815  BP: 107/61 (!) 147/62  Pulse: 74 72  Resp: 16 16  Temp: 97.8 F (36.6 C) 97.9 F (36.6 C)  SpO2: 98% 98%    Opiates Today (MME): Today's  total administered Morphine  Milligram Equivalents: 0 Opiates Yesterday (MME): Yesterday's total administered Morphine  Milligram Equivalents: 7.5  General: Laying in bed comfortably. No acute distress. Respiratory: No increased work of breathing.  Operative Extremity (pelvis/BLE): Dressings removed, incisions are clean, dry, intact.  Some tenderness with palpation across the low back and sacrum bilaterally. Soreness around the right ankle due to known sprain. Otherwise no significant tenderness throughout either extremity.  Tolerates gentle knee and ankle range of motion.  Able to wiggle toes.  Endorses sensation to light touch over all aspects of the foot bilaterally. + DP pulse  IMAGING: Stable post op imaging of the pelvis.   LABS:  Results for orders placed  or performed during the hospital encounter of 10/25/23 (from the past 24 hours)  CBC     Status: Abnormal   Collection Time: 11/01/23  6:05 AM  Result Value Ref Range   WBC 8.4 4.0 - 10.5 K/uL   RBC 3.86 (L) 3.87 - 5.11 MIL/uL   Hemoglobin 10.5 (L) 12.0 - 15.0 g/dL   HCT 68.2 (L) 63.9 - 53.9 %   MCV 82.1 80.0 - 100.0 fL   MCH 27.2 26.0 - 34.0 pg   MCHC 33.1 30.0 - 36.0 g/dL   RDW 86.2 88.4 - 84.4 %   Platelets 340 150 - 400 K/uL   nRBC 0.0 0.0 - 0.2 %    ASSESSMENT: Tammy Lindsey is a 89 y.o. female, 2 Days Post-Op s/p fall Procedures: CLOSED REDUCTION, PELVIS, WITH PERCUTANEOUS FIXATION  CV/Blood loss: Acute blood loss anemia, Hgb 10.5 this AM. Stable over last 24 hours. Hemodynamically stable  PLAN: Weightbearing: WBAT RLE and LLE ROM: Unrestricted ROM Incisional and dressing care: Ok to leave incisions open to air Showering: Okay to begin getting incisions wet starting 11/02/2023 Orthopedic device(s): None  Pain management: Continue multimodal pain control VTE prophylaxis: Lovenox , SCDs ID:  Ancef  2gm post op completed Foley/Lines:  No foley, KVO IVFs Impediments to Fracture Healing: Vitamin D  level 29 when checked in June 2025, continue supplementation Dispo: Continue with Lidocaine  patch over lumbar spine/sacrum. Heating pad to the left thigh as needed. Encourage frequent turns to avoid decubitus ulcer. PT/OT evaluation ongoing, recommending SNF. Patient/family in agreement. TOC following for placement. OK for d/c from ortho standpoint once cleared by medicine . I have signed and placed  d/c rx for DVT ppx and pain medication in chart.  D/C recommendations: - Oxycodone , Tylenol  for pain control - Aspirin  x 30 days for DVT prophylaxis - Continue 1000 units Vit D supplementation daily  Follow - up plan: 2 weeks after d/c for wound check and repeat x-rays   Contact information:  Franky Light MD, Lauraine Moores PA-C. After hours and holidays please check [email or web address] for group  call information for [facility]   Lauraine PATRIC Moores, PA-C [phone] (office) [email or web address]

## 2023-11-02 DIAGNOSIS — S32501A Unspecified fracture of right pubis, initial encounter for closed fracture: Secondary | ICD-10-CM | POA: Diagnosis not present

## 2023-11-02 NOTE — Progress Notes (Signed)
 PROGRESS NOTE    Tammy Lindsey  FMW:[id number] DOB: [birth year] DOA: 10/25/2023 PCP: Charlanne Fredia CROME, MD    Brief Narrative:  89 year old with history of breast cancer, hypertension, depression, neuropathy recently hospitalized 6/15-6/19 with a mechanical fall and pelvic fracture and transferred to a skilled nursing facility had to come back to the hospital with intractable pain associated with the fracture.  Repeat imaging showed right ilium and hemisacrum fractures.  Due to ongoing pain, patient was admitted to the hospital with plan for surgical stabilization.  Subjective:  Patient seen and examined.  Sitting in the chair after she worked with physical therapy.  It was quite difficult to get out of the bed, however patient currently feels comfortable.  She was actually more interactive and pleasant today.  No other overnight events. Discussed with nursing staff to use oral pain medications more frequently in anticipation of discharge.   Assessment & Plan:   Uncontrolled pain secondary to right pubic rami fracture, right ilium fracture and right hemisacrum fracture secondary to mechanical fall: Failed conservative management.  Underwent stabilization with percutaneous screws 7/7. Adequate pain management.  Opiate and nonopiates.  Oxycodone , lidocaine , scheduled Valium  and Robaxin .  Now on oral Valium  and Robaxin .  Encourage oral pain medication intake more frequently. Aggressive bowel regimen. Weightbearing as tolerated. Lovenox  for DVT prophylaxis. Mobilize with PT OT.  Referred to a skilled nursing facility.  Ankle injury: Mechanical injury to the right ankle.  Weightbearing as tolerated.  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  Previously on sodium chloride  tablets.  Sodium 132.  Stable.  Essential hypertension, blood pressure stable on amlodipine  and atenolol .  Depression: On Remeron .  Clinically stabilizing.  Updated Child psychotherapist for potential discharge tomorrow.   DVT  prophylaxis: SCDs Start: 10/30/23 1502 enoxaparin  (LOVENOX ) injection 40 mg Start: 10/25/23 2200   Code Status: DNR with limited intervention Family Communication: None today. Disposition Plan: Status is: Inpatient Remains inpatient appropriate because: Immediate postop.  Pain controlled.     Consultants:  Orthopedics  Procedures:  None  Antimicrobials:  None     Objective: Vitals:   11/01/23 1600 11/01/23 1948 11/02/23 0544 11/02/23 0819  BP: (!) 125/55 (!) 122/52 (!) 156/75 132/63  Pulse: 73 78 81 71  Resp: 14 16 16 16   Temp: (!) 97.1 F (36.2 C) 98.2 F (36.8 C) 98 F (36.7 C) 98.2 F (36.8 C)  TempSrc: Oral     SpO2: 96% 100% 97% 98%  Weight:      Height:        Intake/Output Summary (Last 24 hours) at 11/02/2023 1334 Last data filed at 11/01/2023 2100 Gross per 24 hour  Intake 120 ml  Output 400 ml  Net -280 ml   Filed Weights   10/25/23 1110  Weight: 52.1 kg    Examination:  General exam: Looks very comfortable today.  She is frail and debilitated. Respiratory system: Clear to auscultation. Respiratory effort normal. Cardiovascular system: S1 & S2 heard, RRR. Gastrointestinal system: Soft and nontender.  Bowel sound present. Central nervous system: Alert and awake.  Alert awake and oriented.    Data Reviewed: I have personally reviewed following labs and imaging studies  CBC: Recent Labs  Lab 10/27/23 0421 10/29/23 0526 10/31/23 0703 11/01/23 0605  WBC 7.6 8.7 9.6 8.4  NEUTROABS 5.7 6.4  --   --   HGB 11.6* 11.1* 10.4* 10.5*  HCT 35.3* 32.2* 31.3* 31.7*  MCV 82.7 81.7 82.6 82.1  PLT 446* 437* 395 340   Basic  Metabolic Panel: Recent Labs  Lab 10/27/23 0421 10/28/23 0406 10/29/23 0526 10/31/23 0703  NA 130* 131* 130* 132*  K 3.9 4.3 4.6 3.9  CL 95* 96* 96* 100  CO2 23 23 25 23   GLUCOSE 110* 102* 101* 117*  BUN 11 10 9 13   CREATININE 0.54 0.53 0.55 0.53  CALCIUM  8.7* 8.9 8.6* 8.4*   GFR: Estimated Creatinine Clearance: 37  mL/min (by C-G formula based on SCr of 0.53 mg/dL). Liver Function Tests: No results for input(s): AST, ALT, ALKPHOS, BILITOT, PROT, ALBUMIN in the last 168 hours. No results for input(s): LIPASE, AMYLASE in the last 168 hours. No results for input(s): AMMONIA in the last 168 hours. Coagulation Profile: No results for input(s): INR, PROTIME in the last 168 hours. Cardiac Enzymes: Recent Labs  Lab 10/27/23 1510  CKTOTAL 25*   BNP (last 3 results) No results for input(s): PROBNP in the last 8760 hours. HbA1C: No results for input(s): HGBA1C in the last 72 hours. CBG: No results for input(s): GLUCAP in the last 168 hours. Lipid Profile: No results for input(s): CHOL, HDL, LDLCALC, TRIG, CHOLHDL, LDLDIRECT in the last 72 hours. Thyroid  Function Tests: No results for input(s): TSH, T4TOTAL, FREET4, T3FREE, THYROIDAB in the last 72 hours. Anemia Panel: No results for input(s): VITAMINB12, FOLATE, FERRITIN, TIBC, IRON, RETICCTPCT in the last 72 hours. Sepsis Labs: No results for input(s): PROCALCITON, LATICACIDVEN in the last 168 hours.  Recent Results (from the past 240 hours)  Surgical PCR screen     Status: None   Collection Time: 10/29/23  9:18 PM   Specimen: Nasal Mucosa; Nasal Swab  Result Value Ref Range Status   MRSA, PCR NEGATIVE NEGATIVE Final   Staphylococcus aureus NEGATIVE NEGATIVE Final    Comment: (NOTE) The Xpert SA Assay (FDA approved for NASAL specimens in patients 61 years of age and older), is one component of a comprehensive surveillance program. It is not intended to diagnose infection nor to guide or monitor treatment. Performed at [facility], [address] [place], [place] [address]          Radiology Studies: No results found.       Scheduled Meds:  acetaminophen   1,000 mg Oral Q6H   amLODipine   2.5 mg Oral Daily   atenolol   25 mg Oral QHS   cholecalciferol   1,000  Units Oral Daily   cyanocobalamin   1,000 mcg Oral Daily   diazepam   2 mg Oral Q6H   docusate sodium   100 mg Oral BID   enoxaparin  (LOVENOX ) injection  40 mg Subcutaneous QHS   folic acid   1 mg Oral QHS   lidocaine   2 patch Transdermal Q24H   methocarbamol   500 mg Oral QID   mirtazapine   7.5 mg Oral QHS   pantoprazole   40 mg Oral Daily   polyethylene glycol  17 g Oral BID   Continuous Infusions:     LOS: 8 days    Time spent: 35 minutes    Renato Applebaum, MD [facility]

## 2023-11-02 NOTE — Progress Notes (Signed)
 Physical Therapy Treatment Patient Details Name: Tammy Lindsey MRN: [id number] DOB: 1931/05/15 Today's Date: 11/02/2023   History of Present Illness Tammy Lindsey is a 89 y.o. female who returned to hospital from SNF due to increase in pelvic pain. Repeat imaging showed right ilium and hemisacrum fractures. She is now s/p closed reduction of pelvis with percutaneous fixation 7/7. PMHx: breast cancer, hypertension, depression, neuropathy recently hospitalized 6/15-6/19 with a mechanical fall and pelvic fracture and transferred to a skilled nursing facility    PT Comments  Pt resting in bed on arrival and agreeable to session. Pt demonstrating continued progress towards acute goals, however continues to be limited by LE pain, poor balance/postural reactions, weakness. Pt needing mod A to come to sitting EOB with cues for sequencing. Pt needing mod A to boost to stand from slightly elevated EOB and up to max A to step pivot to chair as pt with very low foot clearance and needing hands on assist to advance feet with fatigue. Pt performing seated LE exercises with fair tolerance once seated up in recliner. Pt continues to benefit from skilled PT services to progress toward functional mobility goals.      If plan is discharge home, recommend the following: Two people to help with bathing/dressing/bathroom;Two people to help with walking and/or transfers;Assistance with cooking/housework;Assist for transportation;Supervision due to cognitive status   Can travel by private vehicle     No  Equipment Recommendations  None recommended by PT    Recommendations for Other Services       Precautions / Restrictions Precautions Precautions: Fall Recall of Precautions/Restrictions: Impaired Precaution/Restrictions Comments: frequent cues to maintain NWB Restrictions Weight Bearing Restrictions Per Provider Order: Yes RLE Weight Bearing Per Provider Order: Weight bearing as tolerated LLE Weight Bearing  Per Provider Order: Weight bearing as tolerated     Mobility  Bed Mobility Overal bed mobility: Needs Assistance Bed Mobility: Supine to Sit     Supine to sit: Mod assist     General bed mobility comments: increased time to transition to EOB. Step-by-step cues and bed pad utilized for assist.    Transfers Overall transfer level: Needs assistance Equipment used: Rolling walker (2 wheels) Transfers: Sit to/from Stand, Bed to chair/wheelchair/BSC Sit to Stand: Mod assist   Step pivot transfers: Mod assist, Max assist       General transfer comment: Step-by-step cues and hands on assist required to advance bil LEs to step pivot transfer to chair. Poor sequencing and initation of movement.    Ambulation/Gait               General Gait Details: Unable to progress to gait training at this time.   Stairs             Wheelchair Mobility     Tilt Bed    Modified Rankin (Stroke Patients Only)       Balance Overall balance assessment: Needs assistance Sitting-balance support: Feet supported Sitting balance-Leahy Scale: Fair Sitting balance - Comments: sitting EOB   Standing balance support: Bilateral upper extremity supported, During functional activity Standing balance-Leahy Scale: Poor Standing balance comment: RW and min A                            Communication Communication Communication: Impaired Factors Affecting Communication: Hearing impaired (hearing aids donned at the start of the session)  Cognition Arousal: Alert Behavior During Therapy: Flat affect   PT - Cognitive impairments: History  of cognitive impairments                         Following commands: Impaired Following commands impaired: Follows one step commands with increased time    Cueing Cueing Techniques: Verbal cues  Exercises General Exercises - Lower Extremity Long Arc Quad: AROM, Both, 10 reps, Seated Hip Flexion/Marching: AROM, Both, Seated, 20  reps    General Comments        Pertinent Vitals/Pain Pain Assessment Pain Assessment: Faces Faces Pain Scale: Hurts little more Pain Location: pelvis Pain Descriptors / Indicators: Grimacing, Discomfort, Aching, Sharp Pain Intervention(s): Premedicated before session, Monitored during session, Limited activity within patient's tolerance    Home Living                          Prior Function            PT Goals (current goals can now be found in the care plan section) Acute Rehab PT Goals Patient Stated Goal: return to [facility], decrease pain PT Goal Formulation: With patient Time For Goal Achievement: 11/14/23 Progress towards PT goals: Progressing toward goals    Frequency    Min 2X/week      PT Plan      Co-evaluation              AM-PAC PT 6 Clicks Mobility   Outcome Measure  Help needed turning from your back to your side while in a flat bed without using bedrails?: A Lot Help needed moving from lying on your back to sitting on the side of a flat bed without using bedrails?: Total Help needed moving to and from a bed to a chair (including a wheelchair)?: A Lot Help needed standing up from a chair using your arms (e.g., wheelchair or bedside chair)?: A Lot Help needed to walk in hospital room?: Total Help needed climbing 3-5 steps with a railing? : Total 6 Click Score: 9    End of Session Equipment Utilized During Treatment: Gait belt Activity Tolerance: Patient tolerated treatment well Patient left: with call bell/phone within reach;in chair Nurse Communication: Mobility status;Need for lift equipment (stedy for back to bed) PT Visit Diagnosis: Other abnormalities of gait and mobility (R26.89);Muscle weakness (generalized) (M62.81);Pain Pain - part of body: Hip     Time: 1136-1204 PT Time Calculation (min) (ACUTE ONLY): 28 min  Charges:    $Therapeutic Activity: 23-37 mins PT General Charges $$ ACUTE PT VISIT: 1 Visit                      Tammy Kras R. PTA Acute Rehabilitation Services Office: [phone]   Tammy Lindsey 11/02/2023, 1:11 PM

## 2023-11-02 NOTE — Progress Notes (Signed)
 Pt's daughter request MD to contact her or brother to inform of tentative d'c date. Given in report to night RN to notify day shift of the family's request.

## 2023-11-02 NOTE — Plan of Care (Signed)

## 2023-11-03 DIAGNOSIS — I10 Essential (primary) hypertension: Secondary | ICD-10-CM

## 2023-11-03 DIAGNOSIS — M25551 Pain in right hip: Secondary | ICD-10-CM | POA: Diagnosis not present

## 2023-11-03 DIAGNOSIS — S32501A Unspecified fracture of right pubis, initial encounter for closed fracture: Secondary | ICD-10-CM | POA: Diagnosis not present

## 2023-11-03 DIAGNOSIS — Z9181 History of falling: Secondary | ICD-10-CM | POA: Diagnosis not present

## 2023-11-03 DIAGNOSIS — S32591S Other specified fracture of right pubis, sequela: Secondary | ICD-10-CM | POA: Diagnosis not present

## 2023-11-03 DIAGNOSIS — R2689 Other abnormalities of gait and mobility: Secondary | ICD-10-CM | POA: Diagnosis not present

## 2023-11-03 DIAGNOSIS — M6281 Muscle weakness (generalized): Secondary | ICD-10-CM | POA: Diagnosis not present

## 2023-11-03 DIAGNOSIS — E871 Hypo-osmolality and hyponatremia: Secondary | ICD-10-CM | POA: Diagnosis not present

## 2023-11-03 MED ORDER — DIAZEPAM 2 MG PO TABS
2.0000 mg | ORAL_TABLET | Freq: Four times a day (QID) | ORAL | 0 refills | Status: DC | PRN
Start: 2023-11-03 — End: 2024-01-08

## 2023-11-03 MED ORDER — DIAZEPAM 2 MG PO TABS: 2.0000 mg | ORAL_TABLET | Freq: Four times a day (QID) | ORAL | 0 refills | Status: DC | PRN

## 2023-11-03 NOTE — [place] FL2 (Signed)
 [place]  MEDICAID FL2 LEVEL OF CARE FORM     IDENTIFICATION  Patient Name: Tammy Lindsey Birthdate: May 23, 1931 Sex: female Admission Date (Current Location): 10/25/2023  [facility] and [place] Number:  Producer, television/film/video and Address:  [facility] [place], [address] [place], [place] [address]      Provider Number: [id number]  Attending Physician Name and Address:  Raenelle Coria, MD  Relative Name and Phone Number:  Stickles,William Chip (Son) [phone]    Current Level of Care: Hospital Recommended Level of Care: Skilled Nursing Facility Prior Approval Number:    Date Approved/Denied:   PASRR Number: [id number]  Discharge Plan: SNF    Current Diagnoses: Patient Active Problem List   Diagnosis Date Noted   Pubic bone fracture (HCC) 10/25/2023   Fracture of ramus of right pubis with routine healing 10/08/2023   Hyponatremia 10/08/2023   Leg cramps 10/08/2023   Systolic murmur 10/08/2023   Idiopathic neuropathy 07/04/2023   Hypertension 07/19/2022   Genetic testing 12/16/2021   Family history of breast cancer 11/25/2021   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   Lumbar radiculopathy 10/28/2020   Degeneration of lumbar intervertebral disc 06/02/2017   Numbness 07/02/2015   Major depressive disorder, single episode, in full remission (HCC) 06/09/2015   Varicose veins of bilateral lower extremities with other complications 07/18/2012   Chronic cough 01/22/2012    Orientation RESPIRATION BLADDER Height & Weight     Self, Time, Situation, Place  Normal Continent Weight: 114 lb 13.8 oz (52.1 kg) Height:  5' 2 (157.5 cm)  BEHAVIORAL SYMPTOMS/MOOD NEUROLOGICAL BOWEL NUTRITION STATUS      Continent Diet (see dc summary)  AMBULATORY STATUS COMMUNICATION OF NEEDS Skin   Extensive Assist Verbally Normal                       Personal Care Assistance Level of Assistance  Bathing, Feeding, Dressing Bathing Assistance:  Maximum assistance Feeding assistance: Limited assistance Dressing Assistance: Maximum assistance     Functional Limitations Info  Sight, Hearing, Speech Sight Info: Adequate Hearing Info: Adequate Speech Info: Adequate    SPECIAL CARE FACTORS FREQUENCY  PT (By licensed PT), OT (By licensed OT)     PT Frequency: 5x week OT Frequency: 5x week            Contractures Contractures Info: Not present    Additional Factors Info  Code Status, Allergies, Psychotropic Code Status Info: DNR Allergies Info: Arimidex  (Anastrozole ), Sulfa Antibiotics Psychotropic Info: Lexapro , remeron          Current Medications (11/03/2023):  This is the current hospital active medication list Current Facility-Administered Medications  Medication Dose Route Frequency Provider Last Rate Last Admin   acetaminophen  (TYLENOL ) tablet 1,000 mg  1,000 mg Oral Q6H Ghimire, Kuber, MD   1,000 mg at 11/03/23 9372   amLODipine  (NORVASC ) tablet 2.5 mg  2.5 mg Oral Daily Danton Lauraine LABOR, PA-C   2.5 mg at 11/02/23 1100   atenolol  (TENORMIN ) tablet 25 mg  25 mg Oral QHS Danton Lauraine LABOR, PA-C   25 mg at 11/02/23 2134   calcium  carbonate (TUMS - dosed in mg elemental calcium ) chewable tablet 200 mg of elemental calcium   1 tablet Oral BID PRN Danton Lauraine LABOR, PA-C   200 mg of elemental calcium  at 10/25/23 1939   cholecalciferol  (VITAMIN D3) 25 MCG (1000 UNIT) tablet 1,000 Units  1,000 Units Oral Daily Danton Lauraine LABOR, PA-C  1,000 Units at 11/02/23 1100   cyanocobalamin  (VITAMIN B12) tablet 1,000 mcg  1,000 mcg Oral Daily Danton Lauraine LABOR, PA-C   1,000 mcg at 11/02/23 1100   diazepam  (VALIUM ) tablet 2 mg  2 mg Oral Q6H Ghimire, Kuber, MD   2 mg at 11/03/23 0145   docusate sodium  (COLACE) capsule 100 mg  100 mg Oral BID Danton Lauraine LABOR, PA-C   100 mg at 11/02/23 2135   enoxaparin  (LOVENOX ) injection 40 mg  40 mg Subcutaneous QHS Danton Lauraine A, PA-C   40 mg at 11/02/23 2213   folic acid  (FOLVITE ) tablet 1 mg  1  mg Oral QHS Danton Lauraine LABOR, PA-C   1 mg at 11/02/23 2134   lidocaine  (LIDODERM ) 5 % 2 patch  2 patch Transdermal Q24H Danton Lauraine LABOR, PA-C   2 patch at 11/02/23 1059   methocarbamol  (ROBAXIN ) tablet 500 mg  500 mg Oral QID Ghimire, Kuber, MD   500 mg at 11/02/23 2134   metoCLOPramide  (REGLAN ) tablet 5-10 mg  5-10 mg Oral Q8H PRN Danton Lauraine LABOR, PA-C       Or   metoCLOPramide  (REGLAN ) injection 5-10 mg  5-10 mg Intravenous Q8H PRN Danton Lauraine LABOR, PA-C       mirtazapine  (REMERON ) tablet 7.5 mg  7.5 mg Oral QHS Danton Lauraine A, PA-C   7.5 mg at 11/02/23 2135   nitroGLYCERIN  (NITROSTAT ) SL tablet 0.4 mg  0.4 mg Sublingual Q5 min PRN Danton Lauraine LABOR, PA-C   0.4 mg at 10/27/23 9094   ondansetron  (ZOFRAN ) tablet 4 mg  4 mg Oral Q6H PRN Danton Lauraine LABOR, PA-C       Or   ondansetron  (ZOFRAN ) injection 4 mg  4 mg Intravenous Q6H PRN Danton Lauraine LABOR, PA-C       oxyCODONE  (Oxy IR/ROXICODONE ) immediate release tablet 2.5-5 mg  2.5-5 mg Oral Q4H PRN Danton Lauraine LABOR, PA-C   5 mg at 11/03/23 9791   pantoprazole  (PROTONIX ) EC tablet 40 mg  40 mg Oral Daily Danton Lauraine LABOR, PA-C   40 mg at 11/02/23 1100   polyethylene glycol (MIRALAX  / GLYCOLAX ) packet 17 g  17 g Oral BID Danton Lauraine LABOR, PA-C   17 g at 11/02/23 2134     Discharge Medications: Please see discharge summary for a list of discharge medications.  Relevant Imaging Results:  Relevant Lab Results:   Additional Information SSN: [id number]  Tammy Lindsey C Tammy Lindsey, LCSWA

## 2023-11-03 NOTE — Discharge Summary (Signed)
 Physician Discharge Summary  Tammy Lindsey FMW:[id number] DOB: 09/18/1931 DOA: 10/25/2023  PCP: Charlanne Fredia CROME, MD  Admit date: 10/25/2023 Discharge date: 11/03/2023  Admitted From: Skilled nursing facility Disposition: Skilled nursing facility  Recommendations for Outpatient Follow-up:  Follow up with PCP in 1-2 weeks Please obtain BMP/CBC in one week Orthopedics to schedule follow-up   Discharge Condition: Fair CODE STATUS: DNR Diet recommendation: Regular diet, nutritional supplements  Discharge summary: 89 year old with history of breast cancer, hypertension, depression, neuropathy recently hospitalized 6/15-6/19 with a mechanical fall and pelvic fracture and transferred to a skilled nursing facility had to come back to the hospital with intractable pain associated with the fracture.  Repeat imaging showed right ilium and hemisacrum fractures.  Due to ongoing pain, patient was admitted to the hospital with plan for surgical stabilization.  Patient underwent pelvic with stabilization and multimodal pain medication with therapies with some clinical improvement.  Going back to skilled nursing facility today to continue inpatient therapies.  Stable for discharge.  Treated for following conditions.  # Uncontrolled pain secondary to right pubic rami fracture, right ilium fracture and right hemisacrum fracture secondary to mechanical fall: Failed conservative management.  Underwent stabilization with percutaneous screws 7/7. Adequate pain management.  Opiate and nonopiates.  Oxycodone , lidocaine ,  Valium  and Robaxin .  Now on oral Valium  and Robaxin .  Encourage oral pain medication intake more frequently. Continue bowel regimen. Weightbearing as tolerated. Lovenox  for DVT prophylaxis.    Ankle injury: Mechanical injury to the right ankle.  Weightbearing as tolerated.   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  Previously on sodium chloride  tablets.  Sodium 132.  Stable.  No need to continue salt  tablets.   Essential hypertension, blood pressure stable on amlodipine  and atenolol .   Depression: On Remeron .  Continue.   Medically stabilized to transfer to a SNF today.  Discharge Diagnoses:  Principal Problem:   Fracture of ramus of right pubis with routine healing Active Problems:   Hyponatremia   Hypertension   Ductal carcinoma in situ (DCIS) of left breast   Major depressive disorder, single episode, in full remission (HCC)   Pubic bone fracture [facility])    Discharge Instructions  Discharge Instructions     Diet general   Complete by: As directed    Increase activity slowly   Complete by: As directed    No dressing needed   Complete by: As directed       Allergies as of 11/03/2023       Reactions   Arimidex  [anastrozole ] Other (See Comments)   Caused excessive sweating. Allergy not listed on MAR   Sulfa Antibiotics Other (See Comments)   The patient said she was told when she was 17 she was allergic to this.        Medication List     STOP taking these medications    meloxicam  15 MG tablet Commonly known as: MOBIC    sodium chloride  1 g tablet       TAKE these medications    acetaminophen  500 MG tablet Commonly known as: TYLENOL  Take 500 mg by mouth every 8 (eight) hours as needed for mild pain (pain score 1-3) (or headaches).   amLODipine  5 MG tablet Commonly known as: NORVASC  Take 2.5 mg by mouth in the morning.   aspirin  EC 325 MG tablet Take 1 tablet (325 mg total) by mouth daily.   atenolol  25 MG tablet Commonly known as: TENORMIN  Take 25 mg by mouth at bedtime.   B-12 1000 MCG  Tabs Take 1,000 mcg by mouth in the morning.   diazepam  2 MG tablet Commonly known as: VALIUM  Take 1 tablet (2 mg total) by mouth every 6 (six) hours as needed for anxiety or muscle spasms (can use along with muscle relaxor).   docusate sodium  100 MG capsule Commonly known as: COLACE Take 1 capsule (100 mg total) by mouth 2 (two) times daily.   folic  acid 800 MCG tablet Commonly known as: FOLVITE  Take 800 mcg by mouth at bedtime.   hydrALAZINE  10 MG tablet Commonly known as: APRESOLINE  Take 1 tablet (10 mg total) by mouth as needed (for blood pressure 150 or above).   lidocaine  4 % Place 1 patch onto the skin in the morning and at bedtime.   methocarbamol  500 MG tablet Commonly known as: ROBAXIN  Take 1 tablet (500 mg total) by mouth every 6 (six) hours as needed for muscle spasms. What changed:  when to take this reasons to take this   mirtazapine  15 MG tablet Commonly known as: REMERON  Take 7.5 mg by mouth at bedtime.   ondansetron  4 MG tablet Commonly known as: ZOFRAN  Take 4 mg by mouth every 6 (six) hours as needed for nausea or vomiting.   oxyCODONE  5 MG immediate release tablet Commonly known as: Oxy IR/ROXICODONE  Take 0.5-1 tablets (2.5-5 mg total) by mouth every 4 (four) hours as needed for moderate pain (pain score 4-6) or severe pain (pain score 7-10) (2.5 mg pain score 4-6, 5 mg pain score 7-10). What changed:  how much to take when to take this reasons to take this   pantoprazole  40 MG tablet Commonly known as: Protonix  Take 1 tablet (40 mg total) by mouth daily.   polyethylene glycol 17 g packet Commonly known as: MIRALAX  / GLYCOLAX  Take 17 g by mouth daily.   Systane Complete PF 0.6 % Soln Generic drug: Propylene Glycol (PF) Place 1 drop into both eyes 3 (three) times daily as needed (for dryness).   Vitamin D3 1000 units Caps Take 2,000 Units by mouth in the morning. What changed: how much to take   Voltaren 1 % Gel Generic drug: diclofenac Sodium Apply 1 Application topically every 6 (six) hours as needed (for pain).               Discharge Care Instructions  (From admission, onward)           Start     Ordered   11/03/23 0000  No dressing needed        11/03/23 0845            Follow-up Information     Haddix, Franky SQUIBB, MD. Schedule an appointment as soon as possible  for a visit in 2 week(s).   Specialty: Orthopedic Surgery Why: for wound check and repeat x-rays Contact information: [address] [place] [address] [phone]                Allergies  Allergen Reactions   Arimidex  [Anastrozole ] Other (See Comments)    Caused excessive sweating. Allergy not listed on MAR   Sulfa Antibiotics Other (See Comments)    The patient said she was told when she was 17 she was allergic to this.    Consultations: Orthopedics   Procedures/Studies: DG Pelvis Comp Min 3V Result Date: 10/30/2023 CLINICAL DATA:  Pelvic fracture. EXAM: JUDET PELVIS - 3+ VIEW COMPARISON:  Preoperative imaging FINDINGS: Screw traverses the sacroiliac joints and sacrum, sacral and iliac fractures on prior CT  are not well demonstrated. Screw traverses the right superior pubic ramus fracture. Unchanged fracture alignment. Displaced right inferior ramus fractures again seen. IMPRESSION: 1. Screw traverses the sacroiliac joints and sacrum, sacral and iliac fractures on prior CT are not well demonstrated. 2. Screw traverses the right superior pubic ramus fracture. Unchanged fracture alignment. Electronically Signed   By: Andrea Gasman M.D.   On: 10/30/2023 15:09   DG Pelvis Comp Min 3V Result Date: 10/30/2023 CLINICAL DATA:  Elective surgery, closed reduction with percutaneous fixation. EXAM: JUDET PELVIS - 3+ VIEW COMPARISON:  Radiograph 10/25/2023 FINDINGS: Fifteen intraoperative spot views of the pelvis submitted from the operating room. A screw traverses the sacroiliac joints. Screw traverses right superior ramus fracture. Fluoroscopy time 1:03 0.2 seconds. Dose 10.72 mGy. IMPRESSION: Intraoperative spot views during pelvic fracture fixation. Electronically Signed   By: Andrea Gasman M.D.   On: 10/30/2023 14:40   DG C-Arm 1-60 Min-No Report Result Date: 10/30/2023 Fluoroscopy was utilized by the requesting physician.  No radiographic interpretation.   CT ANKLE RIGHT  WO CONTRAST Result Date: 10/26/2023 CLINICAL DATA:  Right ankle trauma.  Evaluate for fracture. EXAM: CT OF THE RIGHT ANKLE WITHOUT CONTRAST TECHNIQUE: Multidetector CT imaging of the right ankle was performed according to the standard protocol. Multiplanar CT image reconstructions were also generated. RADIATION DOSE REDUCTION: This exam was performed according to the departmental dose-optimization program which includes automated exposure control, adjustment of the mA and/or kV according to patient size and/or use of iterative reconstruction technique. COMPARISON:  Right foot radiographs 10/20/2023 FINDINGS: Bones/Joint/Cartilage There is subtle curvilinear lucency measuring up to 8 mm in length of the far distal anterior, superficial fibular cortex (sagittal series 8, image 29 and axial series 5, image 80) noted, acute nondisplaced fracture. There is a small defect within the posterior lateral talar body cortex (sagittal series 8, image 39) with two dominant adjacent cortical fragments overall measuring up to approximately 5 x 13 x 7 mm (transverse by long axis of the foot by short axis of the foot, axial series 2, image 70 and sagittal series 8, image 38). This is suspicious for a small, displaced fracture with mild rotation of the displaced fracture component which is located in between the posterior distal fibula and posterior talus. Moderate sized lucent with scattered sclerosis osteochondral defect within the far medial aspect of the talar dome measuring up to 8 x 13 x 9 mm (transverse by AP by craniocaudal. Possible minimal less than 1 mm thinning of the overlying cortex (sagittal series 8, image 54) but no overlying collapse. Small plantar calcaneal heel spur. Mild to moderate tarsometatarsal and navicular-cuneiform osteoarthritis. Ligaments Suboptimally assessed by CT. Muscles and Tendons Normal size and density of the regional musculature. No gross tendon tear is seen. Soft tissues Moderate lateral and  mild anterior and medial ankle soft tissue swelling IMPRESSION: 1. Acute nondisplaced fracture of the far distal anterior, superficial fibular cortex. 2. Small cortical defect within the posterior lateral talar body with two dominant adjacent cortical fragments overall measuring up to approximately 5 x 13 x 7 mm. This is suspicious for a small, displaced fracture with mild rotation of the displaced fracture component which is located in between the posterior distal fibula and posterior talus. 3. Moderate sized osteochondral defect within the far medial aspect of the talar dome measuring up to 8 x 13 x 9 mm. Possible minimal less than 1 mm thinning of the overlying cortex but no overlying collapse. Electronically Signed   By: Tanda  Viola M.D.   On: 10/26/2023 09:54   DG Pelvis 1-2 Views Result Date: 10/25/2023 CLINICAL DATA:  Pelvic fracture EXAM: PELVIS - 1-2 VIEW COMPARISON:  CT pelvis October 25, 2023 FINDINGS: Mildly displaced fractures are identified involving bilateral superior and inferior pubic rami on the right. Remainder of the osseous structures are normal. No evidence of dislocation of SI joints, hip joints and symphysis pubis . Visualized bowel gas pattern is unremarkable . Amorphous calcifications in left pelvic cavity correlating to known uterine fibroid. Urinary catheter overlying the pelvic cavity. IMPRESSION: Mildly displaced fractures of superior and inferior right pubic rami. Correlate with CT findings. Electronically Signed   By: Megan  Zare M.D.   On: 10/25/2023 13:49   CT PELVIS WO CONTRAST Result Date: 10/25/2023 CLINICAL DATA:  Recent fall with right pubic fracture. Worsening pain upon ambulation. EXAM: CT PELVIS WITHOUT CONTRAST TECHNIQUE: Multidetector CT imaging of the pelvis was performed following the standard protocol without intravenous contrast. RADIATION DOSE REDUCTION: This exam was performed according to the departmental dose-optimization program which includes automated exposure  control, adjustment of the mA and/or kV according to patient size and/or use of iterative reconstruction technique. COMPARISON:  Right hip series dated October 08, 2023. FINDINGS: Urinary Tract:  Negative. Bowel: There are few sigmoid diverticula present. The visualized bowel is otherwise unremarkable. Vascular/Lymphatic: Moderate calcific atheromatous disease within the aorta iliac arteries. Reproductive: Small calcified uterine fibroids. The adnexa are unremarkable. Other:  No free fluid or pelvic lymphadenopathy. Musculoskeletal: Comminuted fractures of the right superior and inferior pubic rami. There are also subtle, nondisplaced fractures of the right ilium and sacrum the ilial fracture is evident on image 32 of series 15. The sacral fracture is well demonstrated on image 36. The fractures can also be seen on the coronal reformations, images 94 and 106 of series 20. The left hemipelvis and the visualized femora are intact. IMPRESSION: 1. In addition to the known right pubic rami fractures, there are also subtle nondisplaced fractures of the right ilium and right hemi sacrum. Electronically Signed   By: Evalene Coho M.D.   On: 10/25/2023 13:48   CT Lumbar Spine Wo Contrast Result Date: 10/25/2023 CLINICAL DATA:  Back trauma, no prior imaging (Age >= 16y) EXAM: CT LUMBAR SPINE WITHOUT CONTRAST TECHNIQUE: Multidetector CT imaging of the lumbar spine was performed without intravenous contrast administration. Multiplanar CT image reconstructions were also generated. RADIATION DOSE REDUCTION: This exam was performed according to the departmental dose-optimization program which includes automated exposure control, adjustment of the mA and/or kV according to patient size and/or use of iterative reconstruction technique. COMPARISON:  None Available. FINDINGS: Segmentation: 5 lumbar type vertebrae. Alignment: Mild sigmoid scoliosis of the thoracolumbar spine, with the lumbar curvature convex to the right. Vertebrae:  The lumbar vertebrae are intact. No osseous lesions are present. There are nondisplaced fractures of the right sacrum and ilium present. Paraspinal and other soft tissues: There is moderate calcific atheromatous disease within the aorta iliac arteries. There is no paraspinous soft tissue swelling. Disc levels: There is disc space narrowing at L5-S1. There is no obvious acute disc herniation. There is broad-based disc bulging and bilateral facet hypertrophy at L3-4, causing mild-to-moderate central spinal canal stenosis and bilateral lateral recess stenosis. There is also disc bulging and bilateral facet arthrosis at L4-5, with mild-to-moderate central spinal canal stenosis and bilateral lateral recess stenosis. IMPRESSION: 1. No evidence of acute traumatic injury of the lumbar spine. 2. Thoracolumbar sigmoid scoliosis and multilevel degenerative disc disease with mild-to-moderate central  spinal canal stenosis and bilateral lateral recess stenosis at L3-4 and L4-5. 3. Nondisplaced fractures of the right sacrum and the ileum, which will be more extensively described in the accompanying CT of the pelvis. Electronically Signed   By: Evalene Coho M.D.   On: 10/25/2023 13:32   DG Foot Complete Right Result Date: 10/20/2023 CLINICAL DATA:  Right foot pain and swelling.  No known injury. EXAM: RIGHT FOOT COMPLETE - 3+ VIEW COMPARISON:  None Available. FINDINGS: There is no evidence of fracture or dislocation. Cortical thickening of the second through fourth metatarsal shafts. No erosions. Plantar calcaneal spur. Soft tissue thickening about the dorsum of the foot. No soft tissue gas. No radiopaque foreign body. IMPRESSION: 1. Soft tissue thickening about the dorsum of the foot. No soft tissue gas or radiopaque foreign body. 2. Cortical thickening of the second through fourth metatarsal shafts, can be seen with stress reaction. Electronically Signed   By: Andrea Gasman M.D.   On: 10/20/2023 21:04   DG Hip Unilat  W or Wo Pelvis 2-3 Views Right Result Date: 10/08/2023 CLINICAL DATA:  Fall last night.  Right hip pain. EXAM: DG HIP (WITH OR WITHOUT PELVIS) 2-3V RIGHT COMPARISON:  KUB 10/25/2021 FINDINGS: There is diffuse decreased bone mineralization. There is acute fracture of the junction of the pubic body and the superior right pubic ramus with approximately 7 mm inferior displacement of the pubic body with respect to the superior pubic ramus. Additional nondisplaced fracture line lucency overlying the right inferior pubic ramus with minimal 1 mm superior cortical step-off. Moderate to severe pubic symphysis joint space narrowing, subchondral sclerosis, mild peripheral osteophytosis. Mild right femoroacetabular joint space narrowing and peripheral osteophytosis. Calcifications overlying the left hemipelvis compatible with vascular phleboliths versus fibroid calcifications, unchanged. IMPRESSION: 1. Acute fracture of the junction of the pubic body and the superior right pubic ramus with approximately 7 mm inferior displacement of the pubic body with respect to the superior pubic ramus. 2. Additional nondisplaced acute fracture of the right inferior pubic ramus. 3. Mild right femoroacetabular osteoarthritis. Electronically Signed   By: Tanda Lyons M.D.   On: 10/08/2023 11:04   (Echo, Carotid, EGD, Colonoscopy, ERCP)    Subjective: Patient seen and examined.  She was resting.  Pleasant and interactive.  Denies any pain at rest.  She thinks she will do okay with mobility.   Discharge Exam: Vitals:   11/03/23 0451 11/03/23 0821  BP: (!) 155/72 (!) 150/73  Pulse: 71 75  Resp: 16 16  Temp: (!) 97.5 F (36.4 C) 97.8 F (36.6 C)  SpO2: 92% 92%   Vitals:   11/02/23 1634 11/02/23 2045 11/03/23 0451 11/03/23 0821  BP: 139/60 (!) 142/81 (!) 155/72 (!) 150/73  Pulse: 80 (!) 105 71 75  Resp: 17 18 16 16   Temp: 98.1 F (36.7 C) 97.9 F (36.6 C) (!) 97.5 F (36.4 C) 97.8 F (36.6 C)  TempSrc:  Oral Oral    SpO2:  97% 92% 92%  Weight:      Height:        General: Pt is alert, awake, not in acute distress Pleasant and interactive.  Hard of hearing. Cardiovascular: RRR, S1/S2 +, no rubs, no gallops Respiratory: CTA bilaterally, no wheezing, no rhonchi Abdominal: Soft, NT, ND, bowel sounds + Extremities: no edema, no cyanosis Pelvic area anterior suprapubic incision and posterior incision clean and intact.    The results of significant diagnostics from this hospitalization (including imaging, microbiology, ancillary and laboratory) are listed  below for reference.     Microbiology: Recent Results (from the past 240 hours)  Surgical PCR screen     Status: None   Collection Time: 10/29/23  9:18 PM   Specimen: Nasal Mucosa; Nasal Swab  Result Value Ref Range Status   MRSA, PCR NEGATIVE NEGATIVE Final   Staphylococcus aureus NEGATIVE NEGATIVE Final    Comment: (NOTE) The Xpert SA Assay (FDA approved for NASAL specimens in patients 33 years of age and older), is one component of a comprehensive surveillance program. It is not intended to diagnose infection nor to guide or monitor treatment. Performed at [facility], [address] [place], [place] [address]      Labs: BNP (last 3 results) No results for input(s): BNP in the last 8760 hours. Basic Metabolic Panel: Recent Labs  Lab 10/28/23 0406 10/29/23 0526 10/31/23 0703  NA 131* 130* 132*  K 4.3 4.6 3.9  CL 96* 96* 100  CO2 23 25 23   GLUCOSE 102* 101* 117*  BUN 10 9 13   CREATININE 0.53 0.55 0.53  CALCIUM  8.9 8.6* 8.4*   Liver Function Tests: No results for input(s): AST, ALT, ALKPHOS, BILITOT, PROT, ALBUMIN in the last 168 hours. No results for input(s): LIPASE, AMYLASE in the last 168 hours. No results for input(s): AMMONIA in the last 168 hours. CBC: Recent Labs  Lab 10/29/23 0526 10/31/23 0703 11/01/23 0605  WBC 8.7 9.6 8.4  NEUTROABS 6.4  --   --   HGB 11.1* 10.4* 10.5*  HCT  32.2* 31.3* 31.7*  MCV 81.7 82.6 82.1  PLT 437* 395 340   Cardiac Enzymes: Recent Labs  Lab 10/27/23 1510  CKTOTAL 25*   BNP: Invalid input(s): POCBNP CBG: No results for input(s): GLUCAP in the last 168 hours. D-Dimer No results for input(s): DDIMER in the last 72 hours. Hgb A1c No results for input(s): HGBA1C in the last 72 hours. Lipid Profile No results for input(s): CHOL, HDL, LDLCALC, TRIG, CHOLHDL, LDLDIRECT in the last 72 hours. Thyroid  function studies No results for input(s): TSH, T4TOTAL, T3FREE, THYROIDAB in the last 72 hours.  Invalid input(s): FREET3 Anemia work up No results for input(s): VITAMINB12, FOLATE, FERRITIN, TIBC, IRON, RETICCTPCT in the last 72 hours. Urinalysis    Component Value Date/Time   COLORURINE YELLOW 10/25/2023 1426   APPEARANCEUR CLEAR 10/25/2023 1426   LABSPEC 1.015 10/25/2023 1426   PHURINE 6.0 10/25/2023 1426   GLUCOSEU NEGATIVE 10/25/2023 1426   HGBUR SMALL (A) 10/25/2023 1426   BILIRUBINUR NEGATIVE 10/25/2023 1426   KETONESUR 5 (A) 10/25/2023 1426   PROTEINUR NEGATIVE 10/25/2023 1426   UROBILINOGEN 0.2 07/15/2010 1353   NITRITE NEGATIVE 10/25/2023 1426   LEUKOCYTESUR NEGATIVE 10/25/2023 1426   Sepsis Labs Recent Labs  Lab 10/29/23 0526 10/31/23 0703 11/01/23 0605  WBC 8.7 9.6 8.4   Microbiology Recent Results (from the past 240 hours)  Surgical PCR screen     Status: None   Collection Time: 10/29/23  9:18 PM   Specimen: Nasal Mucosa; Nasal Swab  Result Value Ref Range Status   MRSA, PCR NEGATIVE NEGATIVE Final   Staphylococcus aureus NEGATIVE NEGATIVE Final    Comment: (NOTE) The Xpert SA Assay (FDA approved for NASAL specimens in patients 77 years of age and older), is one component of a comprehensive surveillance program. It is not intended to diagnose infection nor to guide or monitor treatment. Performed at [facility], [address] [place],  [place] [address]  Time coordinating discharge: 35 minutes  SIGNED:   Renato Applebaum, MD  [facility] 11/03/2023, 8:45 AM

## 2023-11-03 NOTE — TOC Transition Note (Addendum)
 Transition of Care [facility]) - Discharge Note   Patient Details  Name: Tammy Lindsey MRN: [id number] Date of Birth: [birth year]  [facility] of [facility]) CM/SW Contact:  Luise JAYSON Pan, LCSWA Phone Number: 11/03/2023, 9:57 AM   Clinical Narrative:   Patient will DC to: [facility] Anticipated DC date: 11/03/23  Family notified: Vertell (dtr) Transport by: [facility]   Per MD patient ready for DC to [facility]. RN to call report prior to discharge [phone]; room 156). RN, patient, patient's family, and facility notified of DC. Discharge Summary and FL2 sent to facility. DC packet on chart. Ambulance transport requested for patient 9:57 AM.   10:44 AM MD completed DC summary and CSW faxed it to facility at this time.   CSW will sign off for now as social work intervention is no longer needed. Please consult us  again if new needs arise.      Final next level of care: Skilled Nursing Facility Barriers to Discharge: Barriers Resolved   Patient Goals and CMS Choice            Discharge Placement              Patient chooses bed at: [facility] Patient to be transferred to facility by: [facility] Name of family member notified: Dtr, Vertell Patient and family notified of of transfer: 11/03/23  Discharge Plan and Services Additional resources added to the After Visit Summary for                                       Social Drivers of Health (SDOH) Interventions SDOH Screenings   Food Insecurity: No Food Insecurity (10/25/2023)  Housing: Low Risk  (10/25/2023)  Transportation Needs: No Transportation Needs (10/25/2023)  Utilities: Not At Risk (10/25/2023)  Alcohol Screen: Low Risk  (11/14/2022)  Depression (PHQ2-9): Low Risk  (07/24/2023)  Financial Resource Strain: Low Risk  (12/21/2021)  Social Connections: Moderately Isolated (10/25/2023)  Tobacco Use: Low Risk  (10/30/2023)     Readmission Risk Interventions    10/12/2023   10:58 AM  Readmission Risk Prevention Plan   Transportation Screening Complete  PCP or Specialist Appt within 5-7 Days Complete  Home Care Screening Complete  Medication Review (RN CM) Complete

## 2023-11-03 NOTE — Progress Notes (Signed)
 JEZABELLA SCHRIEVER to be discharged to [facility] per MD order. Report called to [facility], nurse at [facility].  Skin clean and dry. Surgical sites CDI. Sacrum with nonblanchable redness, foam intact. IV catheter discontinued intact. Site without signs and symptoms of complications. Dressing and pressure applied.  An After Visit Summary and scripts were printed and given to [facility].  Patient escorted via stretcher, and discharged to [facility] via [facility].  Dorthy JONELLE Gay  11/03/2023

## 2023-11-03 NOTE — Plan of Care (Signed)

## 2023-11-06 ENCOUNTER — Other Ambulatory Visit: Payer: Self-pay | Admitting: Internal Medicine

## 2023-11-06 ENCOUNTER — Encounter: Payer: Self-pay | Admitting: Internal Medicine

## 2023-11-06 ENCOUNTER — Non-Acute Institutional Stay (SKILLED_NURSING_FACILITY): Payer: Self-pay | Admitting: Internal Medicine

## 2023-11-06 DIAGNOSIS — M7989 Other specified soft tissue disorders: Secondary | ICD-10-CM | POA: Diagnosis not present

## 2023-11-06 DIAGNOSIS — F5101 Primary insomnia: Secondary | ICD-10-CM | POA: Diagnosis not present

## 2023-11-06 DIAGNOSIS — Z9181 History of falling: Secondary | ICD-10-CM | POA: Diagnosis not present

## 2023-11-06 DIAGNOSIS — E871 Hypo-osmolality and hyponatremia: Secondary | ICD-10-CM | POA: Diagnosis not present

## 2023-11-06 DIAGNOSIS — M25551 Pain in right hip: Secondary | ICD-10-CM | POA: Diagnosis not present

## 2023-11-06 DIAGNOSIS — S32591S Other specified fracture of right pubis, sequela: Secondary | ICD-10-CM | POA: Diagnosis not present

## 2023-11-06 DIAGNOSIS — F329 Major depressive disorder, single episode, unspecified: Secondary | ICD-10-CM

## 2023-11-06 DIAGNOSIS — R252 Cramp and spasm: Secondary | ICD-10-CM | POA: Diagnosis not present

## 2023-11-06 DIAGNOSIS — R41841 Cognitive communication deficit: Secondary | ICD-10-CM | POA: Diagnosis not present

## 2023-11-06 DIAGNOSIS — R531 Weakness: Secondary | ICD-10-CM | POA: Diagnosis not present

## 2023-11-06 DIAGNOSIS — G603 Idiopathic progressive neuropathy: Secondary | ICD-10-CM

## 2023-11-06 DIAGNOSIS — I10 Essential (primary) hypertension: Secondary | ICD-10-CM | POA: Diagnosis not present

## 2023-11-06 DIAGNOSIS — R42 Dizziness and giddiness: Secondary | ICD-10-CM | POA: Diagnosis not present

## 2023-11-06 DIAGNOSIS — M6281 Muscle weakness (generalized): Secondary | ICD-10-CM | POA: Diagnosis not present

## 2023-11-06 DIAGNOSIS — R2689 Other abnormalities of gait and mobility: Secondary | ICD-10-CM | POA: Diagnosis not present

## 2023-11-06 MED ORDER — BUPROPION HCL ER (XL) 150 MG PO TB24
150.0000 mg | ORAL_TABLET | Freq: Every day | ORAL | Status: DC
Start: 2023-11-06 — End: 2024-02-02

## 2023-11-06 MED ORDER — OXYCODONE HCL 5 MG PO TABS: 2.5000 mg | ORAL_TABLET | ORAL | 0 refills | Status: DC | PRN

## 2023-11-06 NOTE — Progress Notes (Signed)
 Provider:  Charlanne Fredia CROME, MD  Location:  [facility] Room Number: 156 P Place of Service:  SNF ([phone])  PCP: Charlanne Fredia CROME, MD Patient Care Team: Charlanne Fredia CROME, MD as PCP - General (Internal Medicine) Lonni Slain, MD as PCP - Cardiology (Cardiology) Cary Doffing, MD as Attending Physician (Dermatology) Rosalva Sawyer, MD as Consulting Physician (Obstetrics and Gynecology) Leslee Reusing, MD as Consulting Physician (Ophthalmology) Glean Stephane BROCKS, RN (Inactive) as Oncology Nurse Navigator Tyree Nanetta SAILOR, RN as Oncology Nurse Navigator Curvin Deward MOULD, MD as Consulting Physician (General Surgery) Lanny Callander, MD as Consulting Physician (Hematology) Shannon Agent, MD as Consulting Physician (Radiation Oncology) Hunsucker, Donnice SAUNDERS, MD as Consulting Physician (Pulmonary Disease)  Extended Emergency Contact Information Primary Emergency Contact: Reckner,William Chip  [place] Home Phone: [phone] Work Phone: [phone] Mobile Phone: [phone] Relation: Son Secondary Emergency Contact: Ryan,Liz Address: [address] [place], [place] [address] [place] Phone: [phone] Relation: Daughter  Code Status: DNR Goals of Care: Advanced Directive information    10/25/2023    6:05 PM  Advanced Directives  Does Patient Have a Medical Advance Directive? Yes  Type of Advance Directive Out of facility DNR (pink MOST or yellow form)  Does patient want to make changes to medical advance directive? Yes (ED - send information to MyChart)  Pre-existing out of facility DNR order (yellow form or pink MOST form) Pink MOST/Yellow Form most recent copy in chart - Physician notified to receive inpatient order      Chief Complaint  Patient presents with   New Admit To SNF    Discuss the need for covid    HPI: Patient is a 89 y.o. female seen today for admission to Rehab  Patient was  initially admitted to Hospital from 6/15-6/19 for Right Pubic Ramus fracture after a fall and Hyponatremia   She came back to Rehab but continued to have severe Pain in her [place] with severe cramps Unable to control with Oxycodone  Send back to Hospital Admitted on 7/2-7/11 for Uncontrolled pain She underwent Stabilization and percutaneous Screws on 07/07   She also had Right Ankle pain and swelling  CT of the Right Ankle showed Acute nondisplaced fracture of the far distal anterior,superficial fibular cortex.  small,displaced fracture with mild rotation of the displaced fracture component which is located in between the posterior distal fibula and posterior talus.  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   Diverticulosis    Esophageal reflux    Family history of breast cancer 11/25/2021   Hiatal hernia    Hx of colonic polyps    Hypertension    Per new patient packet   Mild asthma    Per new patient packet   Osteoarthritis    Osteopenia    bone density 1-09 and 2-13   Pulmonary nodules    scattered-CT Scan July 2010, 12-10, and 04-2010-all stable felt benign  Past Surgical History:  Procedure Laterality Date   BREAST LUMPECTOMY WITH RADIOACTIVE SEED LOCALIZATION Left 12/07/2021   Procedure: LEFT BREAST LUMPECTOMY WITH RADIOACTIVE SEED LOCALIZATION;  Surgeon: Curvin Deward MOULD, MD;  Location:  SURGERY CENTER;  Service: General;  Laterality: Left;   CHOLECYSTECTOMY  04/25/1972   Gall Bladder; [facility]   DG PORTABLE CHEST X RAY ([facility] HX)  2023   Shoulder    DIAGNOSTIC MAMMOGRAM  2022   [facility]; Per new patient packet   JOINT REPLACEMENT Left 08/24/2010   JOINT REPLACEMENT Left 04/25/2010   Reverse    ORIF PELVIC FRACTURE WITH PERCUTANEOUS SCREWS Right 10/30/2023   Procedure: CLOSED REDUCTION, PELVIS, WITH PERCUTANEOUS FIXATION;  Surgeon: Kendal Franky SQUIBB, MD;  Location: [facility] OR;  Service: Orthopedics;  Laterality: Right;   orthroscopic  Rotator Cuff Left 04/25/2009   Partial shoulder Left 04/26/2003   ROTATOR CUFF REPAIR     x 2 1998 and 1999   SPINE SURGERY  04/26/1983   disk   TONSILLECTOMY  04/25/1950   Dr. Jerome; Per new patient packet    reports that she has never smoked. She has never used smokeless tobacco. She reports that she does not currently use alcohol. She reports that she does not use drugs. Social History   Socioeconomic History   Marital status: Widowed    Spouse name: Not on file   Number of children: 2   Years of education: Not on file   Highest education level: Not on file  Occupational History   Occupation: Retired  Tobacco Use   Smoking status: Never   Smokeless tobacco: Never  Vaping Use   Vaping status: Never Used  Substance and Sexual Activity   Alcohol use: Not Currently   Drug use: No   Sexual activity: Not Currently    Birth control/protection: Post-menopausal  Other Topics Concern   Not on file  Social History Narrative   Diet: Left blank      Caffeine: Yes      Married, if yes what year: Widow; married in 1957       Do you live in a house, apartment, assisted living, condo, trailer, ect: [facility]       Is it one or more stories: Amedeo       How many persons live in your home? One      Pets: Cat      Highest level or education completed: Left blank      Current/Past profession: Artist       Exercise: Yes                 Type and how often: Walk         Living Will: Yes   DNR: Yes    POA/HPOA: Left blank       Functional Status:   Do you have difficulty bathing or  dressing yourself? Left blank    Do you have difficulty preparing food or eating? Left blank    Do you have difficulty managing your medications? Left blank    Do you have difficulty managing your finances? Left blank    Do you have difficulty affording your medications? Left blank    Social Drivers of Health   Financial Resource Strain: Low Risk  (12/21/2021)   Overall Financial Resource Strain (CARDIA)    Difficulty of Paying Living Expenses: Not hard at all  Food Insecurity: No Food Insecurity (10/25/2023)   Hunger Vital Sign    Worried  About Running Out of Food in the Last Year: Never true    Ran Out of Food in the Last Year: Never true  Transportation Needs: No Transportation Needs (10/25/2023)   PRAPARE - Administrator, Civil Service (Medical): No    Lack of Transportation (Non-Medical): No  Physical Activity: Not on file  Stress: Not on file  Social Connections: Moderately Isolated (10/25/2023)   Social Connection and Isolation Panel    Frequency of Communication with Friends and Family: More than three times a week    Frequency of Social Gatherings with Friends and Family: Three times a week    Attends Religious Services: Patient declined    Active Member of Clubs or Organizations: Patient declined    Attends Banker Meetings: More than 4 times per year    Marital Status: Widowed  Intimate Partner Violence: Not At Risk (10/25/2023)   Humiliation, Afraid, Rape, and Kick questionnaire    Fear of Current or Ex-Partner: No    Emotionally Abused: No    Physically Abused: No    Sexually Abused: No    Functional Status Survey:    Family History  Problem Relation Age of Onset   Cancer Mother        cervical   Alzheimer's disease Mother    Asthma Father    Other Father        Cerebral hemorrhage   Heart disease Sister        Heart disease before age 55   Hypertension Sister    Stroke Maternal Grandfather    Heart attack Maternal Grandfather     Breast cancer Daughter 52   Asthma Son    Cancer Cousin        dx early 16s; unknown type; paternal female cousin   Neuropathy Neg Hx     Health Maintenance  Topic Date Due   COVID-19 Vaccine (7 - 2024-25 season) 09/19/2023   Medicare Annual Wellness (AWV)  11/14/2023   INFLUENZA VACCINE  11/24/2023   DTaP/Tdap/Td (2 - Td or Tdap) 06/13/2032   Pneumococcal Vaccine: 50+ Years  Completed   DEXA SCAN  Completed   Zoster Vaccines- Shingrix  Completed   Hepatitis B Vaccines  Aged Out   HPV VACCINES  Aged Out   Meningococcal B Vaccine  Aged Out    Allergies  Allergen Reactions   Arimidex  [Anastrozole ] Other (See Comments)    Caused excessive sweating. Allergy not listed on MAR   Sulfa Antibiotics Other (See Comments)    The patient said she was told when she was 17 she was allergic to this.    Outpatient Encounter Medications as of 11/06/2023  Medication Sig   acetaminophen  (TYLENOL ) 500 MG tablet Take 500 mg by mouth every 8 (eight) hours as needed for mild pain (pain score 1-3) (or headaches).   Alum & Mag Hydroxide-Simeth (SG ANTACID LIQUID/SIMETHICONE PO) Take 30 mLs by mouth every 4 (four) hours as needed.   amLODipine  (NORVASC ) 5 MG tablet Take 2.5 mg by mouth in the morning.   aspirin  EC 325 MG tablet Take 1 tablet (325 mg total) by mouth daily.   atenolol  (TENORMIN ) 25 MG tablet Take 25 mg by mouth at bedtime.   Cholecalciferol  (VITAMIN D3) 1000 units CAPS Take 2,000 Units by mouth in the morning.   Cyanocobalamin  (B-12) 1000 MCG TABS Take 1,000 mcg by mouth in the morning.   diazepam  (VALIUM ) 2 MG tablet Take 1 tablet (2 mg total) by mouth every  6 (six) hours as needed for anxiety or muscle spasms (can use along with muscle relaxor).   diclofenac Sodium (VOLTAREN) 1 % GEL Apply 1 Application topically every 6 (six) hours as needed (for pain).   docusate sodium  (COLACE) 100 MG capsule Take 1 capsule (100 mg total) by mouth 2 (two) times daily.   folic acid  (FOLVITE ) 800 MCG  tablet Take 800 mcg by mouth at bedtime.   hydrALAZINE  (APRESOLINE ) 10 MG tablet Take 1 tablet (10 mg total) by mouth as needed (for blood pressure 150 or above).   lidocaine  4 % Place 1 patch onto the skin in the morning and at bedtime.   methocarbamol  (ROBAXIN ) 500 MG tablet Take 1 tablet (500 mg total) by mouth every 6 (six) hours as needed for muscle spasms.   mirtazapine  (REMERON ) 15 MG tablet Take 7.5 mg by mouth at bedtime.   ondansetron  (ZOFRAN ) 4 MG tablet Take 4 mg by mouth every 6 (six) hours as needed for nausea or vomiting.   oxyCODONE  (OXY IR/ROXICODONE ) 5 MG immediate release tablet Take 0.5-1 tablets (2.5-5 mg total) by mouth every 4 (four) hours as needed for moderate pain (pain score 4-6) or severe pain (pain score 7-10) (2.5 mg pain score 4-6, 5 mg pain score 7-10).   pantoprazole  (PROTONIX ) 40 MG tablet Take 1 tablet (40 mg total) by mouth daily.   polyethylene glycol (MIRALAX  / GLYCOLAX ) 17 g packet Take 17 g by mouth daily.   SYSTANE COMPLETE PF 0.6 % SOLN Place 1 drop into both eyes 3 (three) times daily as needed (for dryness).   No facility-administered encounter medications on file as of 11/06/2023.    Review of Systems  Constitutional:  Positive for activity change. Negative for appetite change.  HENT: Negative.    Respiratory:  Negative for cough and shortness of breath.   Cardiovascular:  Negative for leg swelling.  Gastrointestinal:  Negative for constipation.  Genitourinary: Negative.   Musculoskeletal:  Positive for arthralgias, gait problem and myalgias.  Skin: Negative.   Neurological:  Negative for dizziness and weakness.  Psychiatric/Behavioral:  Positive for confusion and dysphoric mood. Negative for sleep disturbance.     Vitals:   11/06/23 0952  BP: 126/67  Pulse: 82  Resp: 14  Temp: 98.2 F (36.8 C)  SpO2: 96%  Weight: 107 lb 12.8 oz (48.9 kg)  Height: 5' 2 (1.575 m)   Body mass index is 19.72 kg/m. Physical Exam Vitals reviewed.   Constitutional:      Appearance: Normal appearance.  HENT:     Head: Normocephalic.     Nose: Nose normal.     Mouth/Throat:     Mouth: Mucous membranes are moist.     Pharynx: Oropharynx is clear.  Eyes:     Pupils: Pupils are equal, round, and reactive to light.  Cardiovascular:     Rate and Rhythm: Normal rate and regular rhythm.     Pulses: Normal pulses.     Heart sounds: Normal heart sounds. No murmur heard. Pulmonary:     Effort: Pulmonary effort is normal.     Breath sounds: Normal breath sounds.  Abdominal:     General: Abdomen is flat. Bowel sounds are normal.     Palpations: Abdomen is soft.  Musculoskeletal:        General: No swelling.     Cervical back: Neck supple.     Comments: Right Ankle Mild swelling  Skin:    General: Skin is warm.  Neurological:  General: No focal deficit present.     Mental Status: She is alert and oriented to person, place, and time.  Psychiatric:        Mood and Affect: Mood is depressed.        Thought Content: Thought content normal.     Labs reviewed: Basic Metabolic Panel: Recent Labs    10/08/23 1646 10/09/23 0509 10/12/23 0522 10/16/23 0000 10/28/23 0406 10/29/23 0526 10/31/23 0703  NA  --    < > 131*   < > 131* 130* 132*  K  --    < > 3.8   < > 4.3 4.6 3.9  CL  --    < > 98   < > 96* 96* 100  CO2  --    < > 23   < > 23 25 23   GLUCOSE  --    < > 94   < > 102* 101* 117*  BUN  --    < > 11   < > 10 9 13   CREATININE  --    < > 0.42*   < > 0.53 0.55 0.53  CALCIUM   --    < > 8.4*   < > 8.9 8.6* 8.4*  MG 1.9  --   --   --   --   --   --   PHOS  --   --  3.6  --   --   --   --    < > = values in this interval not displayed.   Liver Function Tests: Recent Labs    04/13/23 0000 09/26/23 0000 09/26/23 1020 10/12/23 0522  AST 19 19  --   --   ALT 12 13  --   --   ALKPHOS 55 66  --   --   ALBUMIN 4.2  --  4.2 3.1*   No results for input(s): LIPASE, AMYLASE in the last 8760 hours. No results for  input(s): AMMONIA in the last 8760 hours. CBC: Recent Labs    10/26/23 0929 10/27/23 0421 10/29/23 0526 10/31/23 0703 11/01/23 0605  WBC 7.2 7.6 8.7 9.6 8.4  NEUTROABS 5.6 5.7 6.4  --   --   HGB 10.3* 11.6* 11.1* 10.4* 10.5*  HCT 30.9* 35.3* 32.2* 31.3* 31.7*  MCV 84.0 82.7 81.7 82.6 82.1  PLT 394 446* 437* 395 340   Cardiac Enzymes: Recent Labs    10/08/23 1646 10/27/23 1510  CKTOTAL 89 25*   BNP: Invalid input(s): POCBNP Lab Results  Component Value Date   HGBA1C 5.5 06/08/2022   Lab Results  Component Value Date   TSH 0.947 10/11/2023   Lab Results  Component Value Date   VITAMINB12 1,872 (H) 10/08/2023   Lab Results  Component Value Date   FOLATE 11.2 06/08/2022   Lab Results  Component Value Date   FERRITIN 281 10/08/2023    Imaging and Procedures obtained prior to SNF admission: CT ANKLE RIGHT WO CONTRAST Result Date: 10/26/2023 CLINICAL DATA:  Right ankle trauma.  Evaluate for fracture. EXAM: CT OF THE RIGHT ANKLE WITHOUT CONTRAST TECHNIQUE: Multidetector CT imaging of the right ankle was performed according to the standard protocol. Multiplanar CT image reconstructions were also generated. RADIATION DOSE REDUCTION: This exam was performed according to the departmental dose-optimization program which includes automated exposure control, adjustment of the mA and/or kV according to patient size and/or use of iterative reconstruction technique. COMPARISON:  Right foot radiographs 10/20/2023 FINDINGS: Bones/Joint/Cartilage There is subtle curvilinear lucency  measuring up to 8 mm in length of the far distal anterior, superficial fibular cortex (sagittal series 8, image 29 and axial series 5, image 80) noted, acute nondisplaced fracture. There is a small defect within the posterior lateral talar body cortex (sagittal series 8, image 39) with two dominant adjacent cortical fragments overall measuring up to approximately 5 x 13 x 7 mm (transverse by long axis of the  foot by short axis of the foot, axial series 2, image 70 and sagittal series 8, image 38). This is suspicious for a small, displaced fracture with mild rotation of the displaced fracture component which is located in between the posterior distal fibula and posterior talus. Moderate sized lucent with scattered sclerosis osteochondral defect within the far medial aspect of the talar dome measuring up to 8 x 13 x 9 mm (transverse by AP by craniocaudal. Possible minimal less than 1 mm thinning of the overlying cortex (sagittal series 8, image 54) but no overlying collapse. Small plantar calcaneal heel spur. Mild to moderate tarsometatarsal and navicular-cuneiform osteoarthritis. Ligaments Suboptimally assessed by CT. Muscles and Tendons Normal size and density of the regional musculature. No gross tendon tear is seen. Soft tissues Moderate lateral and mild anterior and medial ankle soft tissue swelling IMPRESSION: 1. Acute nondisplaced fracture of the far distal anterior, superficial fibular cortex. 2. Small cortical defect within the posterior lateral talar body with two dominant adjacent cortical fragments overall measuring up to approximately 5 x 13 x 7 mm. This is suspicious for a small, displaced fracture with mild rotation of the displaced fracture component which is located in between the posterior distal fibula and posterior talus. 3. Moderate sized osteochondral defect within the far medial aspect of the talar dome measuring up to 8 x 13 x 9 mm. Possible minimal less than 1 mm thinning of the overlying cortex but no overlying collapse. Electronically Signed   By: Tanda Lyons M.D.   On: 10/26/2023 09:54   DG Pelvis 1-2 Views Result Date: 10/25/2023 CLINICAL DATA:  Pelvic fracture EXAM: PELVIS - 1-2 VIEW COMPARISON:  CT pelvis October 25, 2023 FINDINGS: Mildly displaced fractures are identified involving bilateral superior and inferior pubic rami on the right. Remainder of the osseous structures are normal. No  evidence of dislocation of SI joints, hip joints and symphysis pubis . Visualized bowel gas pattern is unremarkable . Amorphous calcifications in left pelvic cavity correlating to known uterine fibroid. Urinary catheter overlying the pelvic cavity. IMPRESSION: Mildly displaced fractures of superior and inferior right pubic rami. Correlate with CT findings. Electronically Signed   By: Megan  Zare M.D.   On: 10/25/2023 13:49   CT PELVIS WO CONTRAST Result Date: 10/25/2023 CLINICAL DATA:  Recent fall with right pubic fracture. Worsening pain upon ambulation. EXAM: CT PELVIS WITHOUT CONTRAST TECHNIQUE: Multidetector CT imaging of the pelvis was performed following the standard protocol without intravenous contrast. RADIATION DOSE REDUCTION: This exam was performed according to the departmental dose-optimization program which includes automated exposure control, adjustment of the mA and/or kV according to patient size and/or use of iterative reconstruction technique. COMPARISON:  Right hip series dated October 08, 2023. FINDINGS: Urinary Tract:  Negative. Bowel: There are few sigmoid diverticula present. The visualized bowel is otherwise unremarkable. Vascular/Lymphatic: Moderate calcific atheromatous disease within the aorta iliac arteries. Reproductive: Small calcified uterine fibroids. The adnexa are unremarkable. Other:  No free fluid or pelvic lymphadenopathy. Musculoskeletal: Comminuted fractures of the right superior and inferior pubic rami. There are also subtle, nondisplaced fractures of  the right ilium and sacrum the ilial fracture is evident on image 32 of series 15. The sacral fracture is well demonstrated on image 36. The fractures can also be seen on the coronal reformations, images 94 and 106 of series 20. The left hemipelvis and the visualized femora are intact. IMPRESSION: 1. In addition to the known right pubic rami fractures, there are also subtle nondisplaced fractures of the right ilium and right hemi  sacrum. Electronically Signed   By: Evalene Coho M.D.   On: 10/25/2023 13:48   CT Lumbar Spine Wo Contrast Result Date: 10/25/2023 CLINICAL DATA:  Back trauma, no prior imaging (Age >= 16y) EXAM: CT LUMBAR SPINE WITHOUT CONTRAST TECHNIQUE: Multidetector CT imaging of the lumbar spine was performed without intravenous contrast administration. Multiplanar CT image reconstructions were also generated. RADIATION DOSE REDUCTION: This exam was performed according to the departmental dose-optimization program which includes automated exposure control, adjustment of the mA and/or kV according to patient size and/or use of iterative reconstruction technique. COMPARISON:  None Available. FINDINGS: Segmentation: 5 lumbar type vertebrae. Alignment: Mild sigmoid scoliosis of the thoracolumbar spine, with the lumbar curvature convex to the right. Vertebrae: The lumbar vertebrae are intact. No osseous lesions are present. There are nondisplaced fractures of the right sacrum and ilium present. Paraspinal and other soft tissues: There is moderate calcific atheromatous disease within the aorta iliac arteries. There is no paraspinous soft tissue swelling. Disc levels: There is disc space narrowing at L5-S1. There is no obvious acute disc herniation. There is broad-based disc bulging and bilateral facet hypertrophy at L3-4, causing mild-to-moderate central spinal canal stenosis and bilateral lateral recess stenosis. There is also disc bulging and bilateral facet arthrosis at L4-5, with mild-to-moderate central spinal canal stenosis and bilateral lateral recess stenosis. IMPRESSION: 1. No evidence of acute traumatic injury of the lumbar spine. 2. Thoracolumbar sigmoid scoliosis and multilevel degenerative disc disease with mild-to-moderate central spinal canal stenosis and bilateral lateral recess stenosis at L3-4 and L4-5. 3. Nondisplaced fractures of the right sacrum and the ileum, which will be more extensively described in  the accompanying CT of the pelvis. Electronically Signed   By: Evalene Coho M.D.   On: 10/25/2023 13:32    Assessment/Plan 1. Closed fracture of ramus of right pubis, sequela (Primary) Pain Maintained with Oxycodone , Robaxin  and Valium  She has not started doing therapy yet Still tearful sometimes But Nurses were able to get her up with Stand up lift with no Issues Stay on Aspirin  for 30 days WBAT Per therapy patient was able to walk with her walker 2. Swelling of right foot Possible Soft tissue injury per ortho Not recommended Cast  WBAT  3. Hyponatremia Lexapro  discontinued  4. Essential hypertension On Norvasc  and Atenolol   5. Reactive depression On Remeron   Will add Wellbutrin   6. Bilateral leg cramps Using Valium   7. Primary insomnia Remeron   8. Idiopathic progressive neuropathy     Family/ staff Communication:   Labs/tests ordered:

## 2023-11-07 DIAGNOSIS — R531 Weakness: Secondary | ICD-10-CM | POA: Diagnosis not present

## 2023-11-07 DIAGNOSIS — R41841 Cognitive communication deficit: Secondary | ICD-10-CM | POA: Diagnosis not present

## 2023-11-07 DIAGNOSIS — Z9181 History of falling: Secondary | ICD-10-CM | POA: Diagnosis not present

## 2023-11-07 DIAGNOSIS — M6281 Muscle weakness (generalized): Secondary | ICD-10-CM | POA: Diagnosis not present

## 2023-11-07 DIAGNOSIS — M25551 Pain in right hip: Secondary | ICD-10-CM | POA: Diagnosis not present

## 2023-11-07 DIAGNOSIS — S32591S Other specified fracture of right pubis, sequela: Secondary | ICD-10-CM | POA: Diagnosis not present

## 2023-11-07 DIAGNOSIS — R2689 Other abnormalities of gait and mobility: Secondary | ICD-10-CM | POA: Diagnosis not present

## 2023-11-07 DIAGNOSIS — R42 Dizziness and giddiness: Secondary | ICD-10-CM | POA: Diagnosis not present

## 2023-11-08 DIAGNOSIS — R2689 Other abnormalities of gait and mobility: Secondary | ICD-10-CM | POA: Diagnosis not present

## 2023-11-08 DIAGNOSIS — R42 Dizziness and giddiness: Secondary | ICD-10-CM | POA: Diagnosis not present

## 2023-11-08 DIAGNOSIS — Z9181 History of falling: Secondary | ICD-10-CM | POA: Diagnosis not present

## 2023-11-08 DIAGNOSIS — R41841 Cognitive communication deficit: Secondary | ICD-10-CM | POA: Diagnosis not present

## 2023-11-08 DIAGNOSIS — M6281 Muscle weakness (generalized): Secondary | ICD-10-CM | POA: Diagnosis not present

## 2023-11-08 DIAGNOSIS — M25551 Pain in right hip: Secondary | ICD-10-CM | POA: Diagnosis not present

## 2023-11-08 DIAGNOSIS — R531 Weakness: Secondary | ICD-10-CM | POA: Diagnosis not present

## 2023-11-08 DIAGNOSIS — S32591S Other specified fracture of right pubis, sequela: Secondary | ICD-10-CM | POA: Diagnosis not present

## 2023-11-09 DIAGNOSIS — S32591S Other specified fracture of right pubis, sequela: Secondary | ICD-10-CM | POA: Diagnosis not present

## 2023-11-09 DIAGNOSIS — M25551 Pain in right hip: Secondary | ICD-10-CM | POA: Diagnosis not present

## 2023-11-09 DIAGNOSIS — R42 Dizziness and giddiness: Secondary | ICD-10-CM | POA: Diagnosis not present

## 2023-11-09 DIAGNOSIS — R2689 Other abnormalities of gait and mobility: Secondary | ICD-10-CM | POA: Diagnosis not present

## 2023-11-09 DIAGNOSIS — R41841 Cognitive communication deficit: Secondary | ICD-10-CM | POA: Diagnosis not present

## 2023-11-09 DIAGNOSIS — M6281 Muscle weakness (generalized): Secondary | ICD-10-CM | POA: Diagnosis not present

## 2023-11-09 DIAGNOSIS — Z9181 History of falling: Secondary | ICD-10-CM | POA: Diagnosis not present

## 2023-11-09 DIAGNOSIS — D649 Anemia, unspecified: Secondary | ICD-10-CM | POA: Diagnosis not present

## 2023-11-10 DIAGNOSIS — R42 Dizziness and giddiness: Secondary | ICD-10-CM | POA: Diagnosis not present

## 2023-11-10 DIAGNOSIS — M6281 Muscle weakness (generalized): Secondary | ICD-10-CM | POA: Diagnosis not present

## 2023-11-10 DIAGNOSIS — M25551 Pain in right hip: Secondary | ICD-10-CM | POA: Diagnosis not present

## 2023-11-10 DIAGNOSIS — R41841 Cognitive communication deficit: Secondary | ICD-10-CM | POA: Diagnosis not present

## 2023-11-10 DIAGNOSIS — R2689 Other abnormalities of gait and mobility: Secondary | ICD-10-CM | POA: Diagnosis not present

## 2023-11-10 DIAGNOSIS — S32591S Other specified fracture of right pubis, sequela: Secondary | ICD-10-CM | POA: Diagnosis not present

## 2023-11-10 DIAGNOSIS — Z9181 History of falling: Secondary | ICD-10-CM | POA: Diagnosis not present

## 2023-11-10 DIAGNOSIS — R531 Weakness: Secondary | ICD-10-CM | POA: Diagnosis not present

## 2023-11-11 DIAGNOSIS — Z9181 History of falling: Secondary | ICD-10-CM | POA: Diagnosis not present

## 2023-11-11 DIAGNOSIS — R531 Weakness: Secondary | ICD-10-CM | POA: Diagnosis not present

## 2023-11-13 ENCOUNTER — Non-Acute Institutional Stay (SKILLED_NURSING_FACILITY): Payer: Self-pay | Admitting: Internal Medicine

## 2023-11-13 DIAGNOSIS — S32591S Other specified fracture of right pubis, sequela: Secondary | ICD-10-CM

## 2023-11-13 DIAGNOSIS — R531 Weakness: Secondary | ICD-10-CM | POA: Diagnosis not present

## 2023-11-13 DIAGNOSIS — F329 Major depressive disorder, single episode, unspecified: Secondary | ICD-10-CM

## 2023-11-13 DIAGNOSIS — M25551 Pain in right hip: Secondary | ICD-10-CM | POA: Diagnosis not present

## 2023-11-13 DIAGNOSIS — M6281 Muscle weakness (generalized): Secondary | ICD-10-CM | POA: Diagnosis not present

## 2023-11-13 DIAGNOSIS — G3184 Mild cognitive impairment, so stated: Secondary | ICD-10-CM

## 2023-11-13 DIAGNOSIS — R42 Dizziness and giddiness: Secondary | ICD-10-CM | POA: Diagnosis not present

## 2023-11-13 DIAGNOSIS — R2689 Other abnormalities of gait and mobility: Secondary | ICD-10-CM | POA: Diagnosis not present

## 2023-11-13 DIAGNOSIS — Z9181 History of falling: Secondary | ICD-10-CM | POA: Diagnosis not present

## 2023-11-13 DIAGNOSIS — R41841 Cognitive communication deficit: Secondary | ICD-10-CM | POA: Diagnosis not present

## 2023-11-14 DIAGNOSIS — S32591S Other specified fracture of right pubis, sequela: Secondary | ICD-10-CM | POA: Diagnosis not present

## 2023-11-14 DIAGNOSIS — R42 Dizziness and giddiness: Secondary | ICD-10-CM | POA: Diagnosis not present

## 2023-11-14 DIAGNOSIS — R531 Weakness: Secondary | ICD-10-CM | POA: Diagnosis not present

## 2023-11-14 DIAGNOSIS — M25551 Pain in right hip: Secondary | ICD-10-CM | POA: Diagnosis not present

## 2023-11-14 DIAGNOSIS — R2689 Other abnormalities of gait and mobility: Secondary | ICD-10-CM | POA: Diagnosis not present

## 2023-11-14 DIAGNOSIS — M6281 Muscle weakness (generalized): Secondary | ICD-10-CM | POA: Diagnosis not present

## 2023-11-14 DIAGNOSIS — R41841 Cognitive communication deficit: Secondary | ICD-10-CM | POA: Diagnosis not present

## 2023-11-14 DIAGNOSIS — Z9181 History of falling: Secondary | ICD-10-CM | POA: Diagnosis not present

## 2023-11-14 NOTE — Progress Notes (Addendum)
 Location: [facility] illustrator of Service:  SNF (31)  Provider:   Code Status:  Goals of Care:     10/25/2023    6:05 PM  Advanced Directives  Does Patient Have a Medical Advance Directive? Yes  Type of Advance Directive Out of facility DNR (pink MOST or yellow form)  Does patient want to make changes to medical advance directive? Yes (ED - send information to MyChart)  Pre-existing out of facility DNR order (yellow form or pink MOST form) Pink MOST/Yellow Form most recent copy in chart - Physician notified to receive inpatient order     Chief Complaint  Patient presents with   Acute Visit    HPI: Patient is a 89 y.o. female seen today for an acute visit for Follow up of her Depression and Some concerns of Cognition  Acute issues today Depression some concern by family and Nurses Last week started on Wellbutrin  Doing much better Mood better less tearful Walking with therapy pain controlled  Some issue with Cognition noticed Talked to the son  Also Concern whether she will be able to go back to [place] which is her goal  Previous History Patient was initially admitted to Hospital from 6/15-6/19 for Right Pubic Ramus fracture after a fall and Hyponatremia   She came back to Rehab but continued to have severe Pain in her Both Legs with severe cramps Unable to control with Oxycodone  Send back to Hospital Admitted on 7/2-7/11 for Uncontrolled pain She underwent Stabilization and percutaneous Screws on 07/07    She also had Right Ankle pain and swelling   CT of the Right Ankle showed Acute nondisplaced fracture of the far distal anterior,superficial fibular cortex.  small,displaced fracture with mild rotation of the displaced fracture component which is located in between the posterior distal fibula and posterior talus.   At this time Ortho recommended Active ROM No Cast Past Medical History:  Diagnosis Date   Breast cancer (HCC) 10/2021   left breast  DCIS   Diverticulosis    Esophageal reflux    Family history of breast cancer 11/25/2021   Hiatal hernia    Hx of colonic polyps    Hypertension    Per new patient packet   Mild asthma    Per new patient packet   Osteoarthritis    Osteopenia    bone density 1-09 and 2-13   Pulmonary nodules    scattered-CT Scan July 2010, 12-10, and 04-2010-all stable felt benign     Past Surgical History:  Procedure Laterality Date   BREAST LUMPECTOMY WITH RADIOACTIVE SEED LOCALIZATION Left 12/07/2021   Procedure: LEFT BREAST LUMPECTOMY WITH RADIOACTIVE SEED LOCALIZATION;  Surgeon: Curvin Deward MOULD, MD;  Location: [facility];  Service: General;  Laterality: Left;   CHOLECYSTECTOMY  04/25/1972   Gall Bladder; [facility]   DG PORTABLE CHEST X RAY ([facility] HX)  2023   Shoulder   DIAGNOSTIC MAMMOGRAM  2022   [facility]; Per new patient packet   JOINT REPLACEMENT Left 08/24/2010   JOINT REPLACEMENT Left 04/25/2010   Reverse    ORIF PELVIC FRACTURE WITH PERCUTANEOUS SCREWS Right 10/30/2023   Procedure: CLOSED REDUCTION, PELVIS, WITH PERCUTANEOUS FIXATION;  Surgeon: Kendal Franky SQUIBB, MD;  Location: [facility] OR;  Service: Orthopedics;  Laterality: Right;   orthroscopic  Rotator Cuff Left 04/25/2009   Partial shoulder Left 04/26/2003   ROTATOR CUFF REPAIR     x 2 1998 and 1999   SPINE SURGERY  04/26/1983  disk   TONSILLECTOMY  04/25/1950   Dr. Jerome; Per new patient packet    Allergies  Allergen Reactions   Arimidex  [Anastrozole ] Other (See Comments)    Caused excessive sweating. Allergy not listed on MAR   Sulfa Antibiotics Other (See Comments)    The patient said she was told when she was 17 she was allergic to this.    Outpatient Encounter Medications as of 11/13/2023  Medication Sig   acetaminophen  (TYLENOL ) 500 MG tablet Take 500 mg by mouth every 8 (eight) hours as needed for mild pain (pain score 1-3) (or headaches).   Alum & Mag Hydroxide-Simeth (SG ANTACID LIQUID/SIMETHICONE PO)  Take 30 mLs by mouth every 4 (four) hours as needed.   amLODipine  (NORVASC ) 5 MG tablet Take 2.5 mg by mouth in the morning.   aspirin  EC 325 MG tablet Take 1 tablet (325 mg total) by mouth daily.   atenolol  (TENORMIN ) 25 MG tablet Take 25 mg by mouth at bedtime.   buPROPion  (WELLBUTRIN  XL) 150 MG 24 hr tablet Take 1 tablet (150 mg total) by mouth daily.   Cholecalciferol  (VITAMIN D3) 1000 units CAPS Take 2,000 Units by mouth in the morning.   Cyanocobalamin  (B-12) 1000 MCG TABS Take 1,000 mcg by mouth in the morning.   diazepam  (VALIUM ) 2 MG tablet Take 1 tablet (2 mg total) by mouth every 6 (six) hours as needed for anxiety or muscle spasms (can use along with muscle relaxor).   diclofenac Sodium (VOLTAREN) 1 % GEL Apply 1 Application topically every 6 (six) hours as needed (for pain).   docusate sodium  (COLACE) 100 MG capsule Take 1 capsule (100 mg total) by mouth 2 (two) times daily.   folic acid  (FOLVITE ) 800 MCG tablet Take 800 mcg by mouth at bedtime.   hydrALAZINE  (APRESOLINE ) 10 MG tablet Take 1 tablet (10 mg total) by mouth as needed (for blood pressure 150 or above).   lidocaine  4 % Place 1 patch onto the skin in the morning and at bedtime.   methocarbamol  (ROBAXIN ) 500 MG tablet Take 1 tablet (500 mg total) by mouth every 6 (six) hours as needed for muscle spasms.   mirtazapine  (REMERON ) 15 MG tablet Take 7.5 mg by mouth at bedtime.   ondansetron  (ZOFRAN ) 4 MG tablet Take 4 mg by mouth every 6 (six) hours as needed for nausea or vomiting.   oxyCODONE  (OXY IR/ROXICODONE ) 5 MG immediate release tablet Take 0.5-1 tablets (2.5-5 mg total) by mouth every 4 (four) hours as needed for moderate pain (pain score 4-6) or severe pain (pain score 7-10) (2.5 mg pain score 4-6, 5 mg pain score 7-10).   pantoprazole  (PROTONIX ) 40 MG tablet Take 1 tablet (40 mg total) by mouth daily.   polyethylene glycol (MIRALAX  / GLYCOLAX ) 17 g packet Take 17 g by mouth daily.   SYSTANE COMPLETE PF 0.6 % SOLN Place  1 drop into both eyes 3 (three) times daily as needed (for dryness).   No facility-administered encounter medications on file as of 11/13/2023.    Review of Systems:  Review of Systems  Constitutional:  Negative for activity change and appetite change.  HENT: Negative.    Respiratory:  Negative for cough and shortness of breath.   Cardiovascular:  Negative for leg swelling.  Gastrointestinal:  Negative for constipation.  Genitourinary: Negative.   Musculoskeletal:  Positive for gait problem. Negative for arthralgias and myalgias.  Skin: Negative.   Neurological:  Negative for dizziness and weakness.  Psychiatric/Behavioral:  Positive for  confusion. Negative for dysphoric mood and sleep disturbance.     Health Maintenance  Topic Date Due   COVID-19 Vaccine (7 - 2024-25 season) 09/19/2023   Medicare Annual Wellness (AWV)  11/14/2023   INFLUENZA VACCINE  11/24/2023   DTaP/Tdap/Td (2 - Td or Tdap) 06/13/2032   Pneumococcal Vaccine: 50+ Years  Completed   DEXA SCAN  Completed   Zoster Vaccines- Shingrix  Completed   Hepatitis B Vaccines  Aged Out   HPV VACCINES  Aged Out   Meningococcal B Vaccine  Aged Out    Physical Exam: There were no vitals filed for this visit. There is no height or weight on file to calculate BMI. Physical Exam Vitals reviewed.  Constitutional:      Appearance: Normal appearance.  HENT:     Head: Normocephalic.     Nose: Nose normal.     Mouth/Throat:     Mouth: Mucous membranes are moist.     Pharynx: Oropharynx is clear.  Eyes:     Pupils: Pupils are equal, round, and reactive to light.  Cardiovascular:     Rate and Rhythm: Normal rate and regular rhythm.     Pulses: Normal pulses.     Heart sounds: Normal heart sounds. No murmur heard. Pulmonary:     Effort: Pulmonary effort is normal.     Breath sounds: Normal breath sounds.  Abdominal:     General: Abdomen is flat. Bowel sounds are normal.     Palpations: Abdomen is soft.   Musculoskeletal:        General: No swelling.     Cervical back: Neck supple.     Comments: Mild edema in Right ankle  Skin:    General: Skin is warm.  Neurological:     General: No focal deficit present.     Mental Status: She is alert and oriented to person, place, and time.  Psychiatric:        Mood and Affect: Mood normal.        Thought Content: Thought content normal.     Labs reviewed: Basic Metabolic Panel: Recent Labs    04/13/23 0000 09/26/23 0000 10/08/23 1646 10/09/23 0509 10/11/23 1207 10/12/23 0522 10/16/23 0000 10/28/23 0406 10/29/23 0526 10/31/23 0703  NA 135*   < >  --    < >  --  131*   < > 131* 130* 132*  K 4.7   < >  --    < >  --  3.8   < > 4.3 4.6 3.9  CL 99   < >  --    < >  --  98   < > 96* 96* 100  CO2 23*   < >  --    < >  --  23   < > 23 25 23   GLUCOSE  --    < >  --    < >  --  94   < > 102* 101* 117*  BUN 17   < >  --    < >  --  11   < > 10 9 13   CREATININE 0.8   < >  --    < >  --  0.42*   < > 0.53 0.55 0.53  CALCIUM  9.3   < >  --    < >  --  8.4*   < > 8.9 8.6* 8.4*  MG  --   --  1.9  --   --   --   --   --   --   --  PHOS  --   --   --   --   --  3.6  --   --   --   --   TSH 1.35  --   --   --  0.947  --   --   --   --   --    < > = values in this interval not displayed.   Liver Function Tests: Recent Labs    04/13/23 0000 09/26/23 0000 09/26/23 1020 10/12/23 0522  AST 19 19  --   --   ALT 12 13  --   --   ALKPHOS 55 66  --   --   ALBUMIN 4.2  --  4.2 3.1*   No results for input(s): LIPASE, AMYLASE in the last 8760 hours. No results for input(s): AMMONIA in the last 8760 hours. CBC: Recent Labs    10/26/23 0929 10/27/23 0421 10/29/23 0526 10/31/23 0703 11/01/23 0605  WBC 7.2 7.6 8.7 9.6 8.4  NEUTROABS 5.6 5.7 6.4  --   --   HGB 10.3* 11.6* 11.1* 10.4* 10.5*  HCT 30.9* 35.3* 32.2* 31.3* 31.7*  MCV 84.0 82.7 81.7 82.6 82.1  PLT 394 446* 437* 395 340   Lipid Panel: Recent Labs    04/13/23 0000  CHOL 136   HDL 57  LDLCALC 63  TRIG 83   Lab Results  Component Value Date   HGBA1C 5.5 06/08/2022    Procedures since last visit: DG Pelvis Comp Min 3V Result Date: 10/30/2023 CLINICAL DATA:  Pelvic fracture. EXAM: JUDET PELVIS - 3+ VIEW COMPARISON:  Preoperative imaging FINDINGS: Screw traverses the sacroiliac joints and sacrum, sacral and iliac fractures on prior CT are not well demonstrated. Screw traverses the right superior pubic ramus fracture. Unchanged fracture alignment. Displaced right inferior ramus fractures again seen. IMPRESSION: 1. Screw traverses the sacroiliac joints and sacrum, sacral and iliac fractures on prior CT are not well demonstrated. 2. Screw traverses the right superior pubic ramus fracture. Unchanged fracture alignment. Electronically Signed   By: Andrea Gasman M.D.   On: 10/30/2023 15:09   DG Pelvis Comp Min 3V Result Date: 10/30/2023 CLINICAL DATA:  Elective surgery, closed reduction with percutaneous fixation. EXAM: JUDET PELVIS - 3+ VIEW COMPARISON:  Radiograph 10/25/2023 FINDINGS: Fifteen intraoperative spot views of the pelvis submitted from the operating room. A screw traverses the sacroiliac joints. Screw traverses right superior ramus fracture. Fluoroscopy time 1:03 0.2 seconds. Dose 10.72 mGy. IMPRESSION: Intraoperative spot views during pelvic fracture fixation. Electronically Signed   By: Andrea Gasman M.D.   On: 10/30/2023 14:40   DG C-Arm 1-60 Min-No Report Result Date: 10/30/2023 Fluoroscopy was utilized by the requesting physician.  No radiographic interpretation.   CT ANKLE RIGHT WO CONTRAST Result Date: 10/26/2023 CLINICAL DATA:  Right ankle trauma.  Evaluate for fracture. EXAM: CT OF THE RIGHT ANKLE WITHOUT CONTRAST TECHNIQUE: Multidetector CT imaging of the right ankle was performed according to the standard protocol. Multiplanar CT image reconstructions were also generated. RADIATION DOSE REDUCTION: This exam was performed according to the departmental  dose-optimization program which includes automated exposure control, adjustment of the mA and/or kV according to patient size and/or use of iterative reconstruction technique. COMPARISON:  Right foot radiographs 10/20/2023 FINDINGS: Bones/Joint/Cartilage There is subtle curvilinear lucency measuring up to 8 mm in length of the far distal anterior, superficial fibular cortex (sagittal series 8, image 29 and axial series 5, image 80) noted, acute nondisplaced fracture. There is a small defect within the  posterior lateral talar body cortex (sagittal series 8, image 39) with two dominant adjacent cortical fragments overall measuring up to approximately 5 x 13 x 7 mm (transverse by long axis of the foot by short axis of the foot, axial series 2, image 70 and sagittal series 8, image 38). This is suspicious for a small, displaced fracture with mild rotation of the displaced fracture component which is located in between the posterior distal fibula and posterior talus. Moderate sized lucent with scattered sclerosis osteochondral defect within the far medial aspect of the talar dome measuring up to 8 x 13 x 9 mm (transverse by AP by craniocaudal. Possible minimal less than 1 mm thinning of the overlying cortex (sagittal series 8, image 54) but no overlying collapse. Small plantar calcaneal heel spur. Mild to moderate tarsometatarsal and navicular-cuneiform osteoarthritis. Ligaments Suboptimally assessed by CT. Muscles and Tendons Normal size and density of the regional musculature. No gross tendon tear is seen. Soft tissues Moderate lateral and mild anterior and medial ankle soft tissue swelling IMPRESSION: 1. Acute nondisplaced fracture of the far distal anterior, superficial fibular cortex. 2. Small cortical defect within the posterior lateral talar body with two dominant adjacent cortical fragments overall measuring up to approximately 5 x 13 x 7 mm. This is suspicious for a small, displaced fracture with mild rotation  of the displaced fracture component which is located in between the posterior distal fibula and posterior talus. 3. Moderate sized osteochondral defect within the far medial aspect of the talar dome measuring up to 8 x 13 x 9 mm. Possible minimal less than 1 mm thinning of the overlying cortex but no overlying collapse. Electronically Signed   By: Tanda Lyons M.D.   On: 10/26/2023 09:54   DG Pelvis 1-2 Views Result Date: 10/25/2023 CLINICAL DATA:  Pelvic fracture EXAM: PELVIS - 1-2 VIEW COMPARISON:  CT pelvis October 25, 2023 FINDINGS: Mildly displaced fractures are identified involving bilateral superior and inferior pubic rami on the right. Remainder of the osseous structures are normal. No evidence of dislocation of SI joints, hip joints and symphysis pubis . Visualized bowel gas pattern is unremarkable . Amorphous calcifications in left pelvic cavity correlating to known uterine fibroid. Urinary catheter overlying the pelvic cavity. IMPRESSION: Mildly displaced fractures of superior and inferior right pubic rami. Correlate with CT findings. Electronically Signed   By: Megan  Zare M.D.   On: 10/25/2023 13:49   CT PELVIS WO CONTRAST Result Date: 10/25/2023 CLINICAL DATA:  Recent fall with right pubic fracture. Worsening pain upon ambulation. EXAM: CT PELVIS WITHOUT CONTRAST TECHNIQUE: Multidetector CT imaging of the pelvis was performed following the standard protocol without intravenous contrast. RADIATION DOSE REDUCTION: This exam was performed according to the departmental dose-optimization program which includes automated exposure control, adjustment of the mA and/or kV according to patient size and/or use of iterative reconstruction technique. COMPARISON:  Right hip series dated October 08, 2023. FINDINGS: Urinary Tract:  Negative. Bowel: There are few sigmoid diverticula present. The visualized bowel is otherwise unremarkable. Vascular/Lymphatic: Moderate calcific atheromatous disease within the aorta iliac  arteries. Reproductive: Small calcified uterine fibroids. The adnexa are unremarkable. Other:  No free fluid or pelvic lymphadenopathy. Musculoskeletal: Comminuted fractures of the right superior and inferior pubic rami. There are also subtle, nondisplaced fractures of the right ilium and sacrum the ilial fracture is evident on image 32 of series 15. The sacral fracture is well demonstrated on image 36. The fractures can also be seen on the coronal reformations, images 94  and 106 of series 20. The left hemipelvis and the visualized femora are intact. IMPRESSION: 1. In addition to the known right pubic rami fractures, there are also subtle nondisplaced fractures of the right ilium and right hemi sacrum. Electronically Signed   By: Evalene Coho M.D.   On: 10/25/2023 13:48   CT Lumbar Spine Wo Contrast Result Date: 10/25/2023 CLINICAL DATA:  Back trauma, no prior imaging (Age >= 16y) EXAM: CT LUMBAR SPINE WITHOUT CONTRAST TECHNIQUE: Multidetector CT imaging of the lumbar spine was performed without intravenous contrast administration. Multiplanar CT image reconstructions were also generated. RADIATION DOSE REDUCTION: This exam was performed according to the departmental dose-optimization program which includes automated exposure control, adjustment of the mA and/or kV according to patient size and/or use of iterative reconstruction technique. COMPARISON:  None Available. FINDINGS: Segmentation: 5 lumbar type vertebrae. Alignment: Mild sigmoid scoliosis of the thoracolumbar spine, with the lumbar curvature convex to the right. Vertebrae: The lumbar vertebrae are intact. No osseous lesions are present. There are nondisplaced fractures of the right sacrum and ilium present. Paraspinal and other soft tissues: There is moderate calcific atheromatous disease within the aorta iliac arteries. There is no paraspinous soft tissue swelling. Disc levels: There is disc space narrowing at L5-S1. There is no obvious acute disc  herniation. There is broad-based disc bulging and bilateral facet hypertrophy at L3-4, causing mild-to-moderate central spinal canal stenosis and bilateral lateral recess stenosis. There is also disc bulging and bilateral facet arthrosis at L4-5, with mild-to-moderate central spinal canal stenosis and bilateral lateral recess stenosis. IMPRESSION: 1. No evidence of acute traumatic injury of the lumbar spine. 2. Thoracolumbar sigmoid scoliosis and multilevel degenerative disc disease with mild-to-moderate central spinal canal stenosis and bilateral lateral recess stenosis at L3-4 and L4-5. 3. Nondisplaced fractures of the right sacrum and the ileum, which will be more extensively described in the accompanying CT of the pelvis. Electronically Signed   By: Evalene Coho M.D.   On: 10/25/2023 13:32    Assessment/Plan 1. Mild cognitive impairment (Primary) D/w Son  Patient is making improvement Does have some Cognitive impairment Wiil continue to monitor Can be due to her Meds Try to taper her from Valium  and Oxycodone  ST to eval and Treat 2. Reactive depression Doing well on Wellbutrin  and Remeron  Avoid SSRi due to Hyponatremia  3. Closed fracture of ramus of right pubis, sequela Pain seems controlled Walking with Therapy    Labs/tests ordered:  * No order type specified * Next appt:  Visit date not found

## 2023-11-15 DIAGNOSIS — R2689 Other abnormalities of gait and mobility: Secondary | ICD-10-CM | POA: Diagnosis not present

## 2023-11-15 DIAGNOSIS — Z9181 History of falling: Secondary | ICD-10-CM | POA: Diagnosis not present

## 2023-11-15 DIAGNOSIS — S32591S Other specified fracture of right pubis, sequela: Secondary | ICD-10-CM | POA: Diagnosis not present

## 2023-11-15 DIAGNOSIS — M6281 Muscle weakness (generalized): Secondary | ICD-10-CM | POA: Diagnosis not present

## 2023-11-15 DIAGNOSIS — R42 Dizziness and giddiness: Secondary | ICD-10-CM | POA: Diagnosis not present

## 2023-11-15 DIAGNOSIS — R41841 Cognitive communication deficit: Secondary | ICD-10-CM | POA: Diagnosis not present

## 2023-11-15 DIAGNOSIS — M25551 Pain in right hip: Secondary | ICD-10-CM | POA: Diagnosis not present

## 2023-11-15 DIAGNOSIS — R531 Weakness: Secondary | ICD-10-CM | POA: Diagnosis not present

## 2023-11-16 DIAGNOSIS — R2689 Other abnormalities of gait and mobility: Secondary | ICD-10-CM | POA: Diagnosis not present

## 2023-11-16 DIAGNOSIS — R41841 Cognitive communication deficit: Secondary | ICD-10-CM | POA: Diagnosis not present

## 2023-11-16 DIAGNOSIS — M25551 Pain in right hip: Secondary | ICD-10-CM | POA: Diagnosis not present

## 2023-11-16 DIAGNOSIS — Z9181 History of falling: Secondary | ICD-10-CM | POA: Diagnosis not present

## 2023-11-16 DIAGNOSIS — R42 Dizziness and giddiness: Secondary | ICD-10-CM | POA: Diagnosis not present

## 2023-11-16 DIAGNOSIS — M6281 Muscle weakness (generalized): Secondary | ICD-10-CM | POA: Diagnosis not present

## 2023-11-16 DIAGNOSIS — R531 Weakness: Secondary | ICD-10-CM | POA: Diagnosis not present

## 2023-11-16 DIAGNOSIS — S32591S Other specified fracture of right pubis, sequela: Secondary | ICD-10-CM | POA: Diagnosis not present

## 2023-11-17 DIAGNOSIS — R41841 Cognitive communication deficit: Secondary | ICD-10-CM | POA: Diagnosis not present

## 2023-11-17 DIAGNOSIS — R531 Weakness: Secondary | ICD-10-CM | POA: Diagnosis not present

## 2023-11-17 DIAGNOSIS — R42 Dizziness and giddiness: Secondary | ICD-10-CM | POA: Diagnosis not present

## 2023-11-17 DIAGNOSIS — Z9181 History of falling: Secondary | ICD-10-CM | POA: Diagnosis not present

## 2023-11-19 DIAGNOSIS — M6281 Muscle weakness (generalized): Secondary | ICD-10-CM | POA: Diagnosis not present

## 2023-11-19 DIAGNOSIS — Z9181 History of falling: Secondary | ICD-10-CM | POA: Diagnosis not present

## 2023-11-19 DIAGNOSIS — M25551 Pain in right hip: Secondary | ICD-10-CM | POA: Diagnosis not present

## 2023-11-19 DIAGNOSIS — S32591S Other specified fracture of right pubis, sequela: Secondary | ICD-10-CM | POA: Diagnosis not present

## 2023-11-19 DIAGNOSIS — R2689 Other abnormalities of gait and mobility: Secondary | ICD-10-CM | POA: Diagnosis not present

## 2023-11-20 ENCOUNTER — Encounter: Payer: Self-pay | Admitting: Internal Medicine

## 2023-11-20 DIAGNOSIS — R42 Dizziness and giddiness: Secondary | ICD-10-CM | POA: Diagnosis not present

## 2023-11-20 DIAGNOSIS — R531 Weakness: Secondary | ICD-10-CM | POA: Diagnosis not present

## 2023-11-20 DIAGNOSIS — S32591S Other specified fracture of right pubis, sequela: Secondary | ICD-10-CM | POA: Diagnosis not present

## 2023-11-20 DIAGNOSIS — M6281 Muscle weakness (generalized): Secondary | ICD-10-CM | POA: Diagnosis not present

## 2023-11-20 DIAGNOSIS — R2689 Other abnormalities of gait and mobility: Secondary | ICD-10-CM | POA: Diagnosis not present

## 2023-11-20 DIAGNOSIS — Z9181 History of falling: Secondary | ICD-10-CM | POA: Diagnosis not present

## 2023-11-20 DIAGNOSIS — M25551 Pain in right hip: Secondary | ICD-10-CM | POA: Diagnosis not present

## 2023-11-20 DIAGNOSIS — R41841 Cognitive communication deficit: Secondary | ICD-10-CM | POA: Diagnosis not present

## 2023-11-21 DIAGNOSIS — R42 Dizziness and giddiness: Secondary | ICD-10-CM | POA: Diagnosis not present

## 2023-11-21 DIAGNOSIS — Z9181 History of falling: Secondary | ICD-10-CM | POA: Diagnosis not present

## 2023-11-21 DIAGNOSIS — M6281 Muscle weakness (generalized): Secondary | ICD-10-CM | POA: Diagnosis not present

## 2023-11-21 DIAGNOSIS — R2689 Other abnormalities of gait and mobility: Secondary | ICD-10-CM | POA: Diagnosis not present

## 2023-11-21 DIAGNOSIS — M25551 Pain in right hip: Secondary | ICD-10-CM | POA: Diagnosis not present

## 2023-11-21 DIAGNOSIS — S32810D Multiple fractures of pelvis with stable disruption of pelvic ring, subsequent encounter for fracture with routine healing: Secondary | ICD-10-CM | POA: Diagnosis not present

## 2023-11-21 DIAGNOSIS — S32591S Other specified fracture of right pubis, sequela: Secondary | ICD-10-CM | POA: Diagnosis not present

## 2023-11-21 DIAGNOSIS — R41841 Cognitive communication deficit: Secondary | ICD-10-CM | POA: Diagnosis not present

## 2023-11-21 DIAGNOSIS — R531 Weakness: Secondary | ICD-10-CM | POA: Diagnosis not present

## 2023-11-22 DIAGNOSIS — R2689 Other abnormalities of gait and mobility: Secondary | ICD-10-CM | POA: Diagnosis not present

## 2023-11-22 DIAGNOSIS — R41841 Cognitive communication deficit: Secondary | ICD-10-CM | POA: Diagnosis not present

## 2023-11-22 DIAGNOSIS — M25551 Pain in right hip: Secondary | ICD-10-CM | POA: Diagnosis not present

## 2023-11-22 DIAGNOSIS — M6281 Muscle weakness (generalized): Secondary | ICD-10-CM | POA: Diagnosis not present

## 2023-11-22 DIAGNOSIS — R42 Dizziness and giddiness: Secondary | ICD-10-CM | POA: Diagnosis not present

## 2023-11-22 DIAGNOSIS — R531 Weakness: Secondary | ICD-10-CM | POA: Diagnosis not present

## 2023-11-22 DIAGNOSIS — S32591S Other specified fracture of right pubis, sequela: Secondary | ICD-10-CM | POA: Diagnosis not present

## 2023-11-22 DIAGNOSIS — Z9181 History of falling: Secondary | ICD-10-CM | POA: Diagnosis not present

## 2023-11-23 DIAGNOSIS — R42 Dizziness and giddiness: Secondary | ICD-10-CM | POA: Diagnosis not present

## 2023-11-23 DIAGNOSIS — Z9181 History of falling: Secondary | ICD-10-CM | POA: Diagnosis not present

## 2023-11-23 DIAGNOSIS — M6281 Muscle weakness (generalized): Secondary | ICD-10-CM | POA: Diagnosis not present

## 2023-11-23 DIAGNOSIS — R2689 Other abnormalities of gait and mobility: Secondary | ICD-10-CM | POA: Diagnosis not present

## 2023-11-23 DIAGNOSIS — M25551 Pain in right hip: Secondary | ICD-10-CM | POA: Diagnosis not present

## 2023-11-23 DIAGNOSIS — R531 Weakness: Secondary | ICD-10-CM | POA: Diagnosis not present

## 2023-11-23 DIAGNOSIS — R41841 Cognitive communication deficit: Secondary | ICD-10-CM | POA: Diagnosis not present

## 2023-11-23 DIAGNOSIS — S32591S Other specified fracture of right pubis, sequela: Secondary | ICD-10-CM | POA: Diagnosis not present

## 2023-11-24 DIAGNOSIS — R42 Dizziness and giddiness: Secondary | ICD-10-CM | POA: Diagnosis not present

## 2023-11-24 DIAGNOSIS — R531 Weakness: Secondary | ICD-10-CM | POA: Diagnosis not present

## 2023-11-24 DIAGNOSIS — S32591S Other specified fracture of right pubis, sequela: Secondary | ICD-10-CM | POA: Diagnosis not present

## 2023-11-24 DIAGNOSIS — Z9181 History of falling: Secondary | ICD-10-CM | POA: Diagnosis not present

## 2023-11-24 DIAGNOSIS — M25551 Pain in right hip: Secondary | ICD-10-CM | POA: Diagnosis not present

## 2023-11-24 DIAGNOSIS — M6281 Muscle weakness (generalized): Secondary | ICD-10-CM | POA: Diagnosis not present

## 2023-11-24 DIAGNOSIS — R2689 Other abnormalities of gait and mobility: Secondary | ICD-10-CM | POA: Diagnosis not present

## 2023-11-24 DIAGNOSIS — R41841 Cognitive communication deficit: Secondary | ICD-10-CM | POA: Diagnosis not present

## 2023-11-27 DIAGNOSIS — M6281 Muscle weakness (generalized): Secondary | ICD-10-CM | POA: Diagnosis not present

## 2023-11-27 DIAGNOSIS — R41841 Cognitive communication deficit: Secondary | ICD-10-CM | POA: Diagnosis not present

## 2023-11-27 DIAGNOSIS — R2689 Other abnormalities of gait and mobility: Secondary | ICD-10-CM | POA: Diagnosis not present

## 2023-11-27 DIAGNOSIS — S32591S Other specified fracture of right pubis, sequela: Secondary | ICD-10-CM | POA: Diagnosis not present

## 2023-11-27 DIAGNOSIS — R531 Weakness: Secondary | ICD-10-CM | POA: Diagnosis not present

## 2023-11-27 DIAGNOSIS — R42 Dizziness and giddiness: Secondary | ICD-10-CM | POA: Diagnosis not present

## 2023-11-27 DIAGNOSIS — Z9181 History of falling: Secondary | ICD-10-CM | POA: Diagnosis not present

## 2023-11-27 DIAGNOSIS — M25551 Pain in right hip: Secondary | ICD-10-CM | POA: Diagnosis not present

## 2023-11-28 DIAGNOSIS — R531 Weakness: Secondary | ICD-10-CM | POA: Diagnosis not present

## 2023-11-28 DIAGNOSIS — R41841 Cognitive communication deficit: Secondary | ICD-10-CM | POA: Diagnosis not present

## 2023-11-28 DIAGNOSIS — Z9181 History of falling: Secondary | ICD-10-CM | POA: Diagnosis not present

## 2023-11-28 DIAGNOSIS — R42 Dizziness and giddiness: Secondary | ICD-10-CM | POA: Diagnosis not present

## 2023-11-28 DIAGNOSIS — M25551 Pain in right hip: Secondary | ICD-10-CM | POA: Diagnosis not present

## 2023-11-28 DIAGNOSIS — S32591S Other specified fracture of right pubis, sequela: Secondary | ICD-10-CM | POA: Diagnosis not present

## 2023-11-28 DIAGNOSIS — R2689 Other abnormalities of gait and mobility: Secondary | ICD-10-CM | POA: Diagnosis not present

## 2023-11-28 DIAGNOSIS — M6281 Muscle weakness (generalized): Secondary | ICD-10-CM | POA: Diagnosis not present

## 2023-11-29 DIAGNOSIS — R2689 Other abnormalities of gait and mobility: Secondary | ICD-10-CM | POA: Diagnosis not present

## 2023-11-29 DIAGNOSIS — R42 Dizziness and giddiness: Secondary | ICD-10-CM | POA: Diagnosis not present

## 2023-11-29 DIAGNOSIS — R531 Weakness: Secondary | ICD-10-CM | POA: Diagnosis not present

## 2023-11-29 DIAGNOSIS — Z9181 History of falling: Secondary | ICD-10-CM | POA: Diagnosis not present

## 2023-11-29 DIAGNOSIS — M6281 Muscle weakness (generalized): Secondary | ICD-10-CM | POA: Diagnosis not present

## 2023-11-29 DIAGNOSIS — M25551 Pain in right hip: Secondary | ICD-10-CM | POA: Diagnosis not present

## 2023-11-29 DIAGNOSIS — S32591S Other specified fracture of right pubis, sequela: Secondary | ICD-10-CM | POA: Diagnosis not present

## 2023-11-29 DIAGNOSIS — R41841 Cognitive communication deficit: Secondary | ICD-10-CM | POA: Diagnosis not present

## 2023-11-30 ENCOUNTER — Non-Acute Institutional Stay (SKILLED_NURSING_FACILITY): Payer: Self-pay | Admitting: Adult Health

## 2023-11-30 ENCOUNTER — Encounter: Payer: Self-pay | Admitting: Adult Health

## 2023-11-30 DIAGNOSIS — M6281 Muscle weakness (generalized): Secondary | ICD-10-CM | POA: Diagnosis not present

## 2023-11-30 DIAGNOSIS — S32810D Multiple fractures of pelvis with stable disruption of pelvic ring, subsequent encounter for fracture with routine healing: Secondary | ICD-10-CM

## 2023-11-30 DIAGNOSIS — F325 Major depressive disorder, single episode, in full remission: Secondary | ICD-10-CM

## 2023-11-30 DIAGNOSIS — I10 Essential (primary) hypertension: Secondary | ICD-10-CM | POA: Diagnosis not present

## 2023-11-30 DIAGNOSIS — S32591S Other specified fracture of right pubis, sequela: Secondary | ICD-10-CM | POA: Diagnosis not present

## 2023-11-30 DIAGNOSIS — R41841 Cognitive communication deficit: Secondary | ICD-10-CM | POA: Diagnosis not present

## 2023-11-30 DIAGNOSIS — R2689 Other abnormalities of gait and mobility: Secondary | ICD-10-CM | POA: Diagnosis not present

## 2023-11-30 DIAGNOSIS — R053 Chronic cough: Secondary | ICD-10-CM | POA: Diagnosis not present

## 2023-11-30 DIAGNOSIS — M85851 Other specified disorders of bone density and structure, right thigh: Secondary | ICD-10-CM | POA: Diagnosis not present

## 2023-11-30 DIAGNOSIS — R531 Weakness: Secondary | ICD-10-CM | POA: Diagnosis not present

## 2023-11-30 DIAGNOSIS — M7989 Other specified soft tissue disorders: Secondary | ICD-10-CM | POA: Diagnosis not present

## 2023-11-30 DIAGNOSIS — R42 Dizziness and giddiness: Secondary | ICD-10-CM | POA: Diagnosis not present

## 2023-11-30 DIAGNOSIS — Z9181 History of falling: Secondary | ICD-10-CM | POA: Diagnosis not present

## 2023-11-30 DIAGNOSIS — E871 Hypo-osmolality and hyponatremia: Secondary | ICD-10-CM

## 2023-11-30 DIAGNOSIS — M85852 Other specified disorders of bone density and structure, left thigh: Secondary | ICD-10-CM

## 2023-11-30 DIAGNOSIS — M25551 Pain in right hip: Secondary | ICD-10-CM | POA: Diagnosis not present

## 2023-11-30 MED ORDER — ACETAMINOPHEN 500 MG PO TABS: 1000.0000 mg | ORAL_TABLET | Freq: Two times a day (BID) | ORAL | Status: AC

## 2023-11-30 MED ORDER — OXYCODONE HCL 5 MG PO TABS: 2.5000 mg | ORAL_TABLET | Freq: Two times a day (BID) | ORAL | Status: AC | PRN

## 2023-11-30 NOTE — Progress Notes (Addendum)
 Location:  [facility] Room Number: 156 P Place of Service:  SNF ([phone]) Provider:  Tawni America, NP   Patient Care Team: Charlanne Fredia CROME, MD as PCP - General (Internal Medicine) Lonni Slain, MD as PCP - Cardiology (Cardiology) Cary Doffing, MD as Attending Physician (Dermatology) Rosalva Sawyer, MD as Consulting Physician (Obstetrics and Gynecology) Leslee Reusing, MD as Consulting Physician (Ophthalmology) Glean Stephane BROCKS, RN (Inactive) as Oncology Nurse Navigator Tyree Nanetta SAILOR, RN as Oncology Nurse Navigator Curvin Deward MOULD, MD as Consulting Physician (General Surgery) Lanny Callander, MD as Consulting Physician (Hematology) Shannon Agent, MD as Consulting Physician (Radiation Oncology) Hunsucker, Donnice SAUNDERS, MD as Consulting Physician (Pulmonary Disease)  Extended Emergency Contact Information Primary Emergency Contact: Ebrahim,William Chip  [place] Home Phone: [phone] Work Phone: [phone] Mobile Phone: [phone] Relation: Son Secondary Emergency Contact: Ryan,Liz Address: [address] [place], [place] [address] [place] Phone: [phone] Relation: Daughter  Code Status:  DNR Goals of care: Advanced Directive information    10/25/2023    6:05 PM  Advanced Directives  Does Patient Have a Medical Advance Directive? Yes  Type of Advance Directive Out of facility DNR (pink MOST or yellow form)  Does patient want to make changes to medical advance directive? Yes (ED - send information to MyChart)  Pre-existing out of facility DNR order (yellow form or pink MOST form) Pink MOST/Yellow Form most recent copy in chart - Physician notified to receive inpatient order     Chief Complaint  Patient presents with   Acute Visit    Follow up anxiety    HPI:  Pt is a 89 y.o. female seen today for an acute visit for follow up on anxiety/depression and MCI   The patient is a 89  year old with a history of pelvic fracture and cognitive impairment who presents for follow-up after recent hospitalizations.  Musculoskeletal trauma and pain - Hospitalized June 15-19, 2025 for a fall resulting in a right plevic fracture  - Experienced right foot injury with soft tissue and cortical thickening on October 20, 2023 - Readmitted July 2-11, 2025 for intractable pain; imaging revealed fractures of the right ilium and hemisacrum - Underwent surgical stabilization with percutaneous screws on October 30, 2023 - Currently bearing weight and ambulates independently with a walker - CT on October 26, 2023 showed acute nondisplaced fracture of the distal anterior superior fibular cortex - Right foot is no longer painful, but some swelling persists - Uses lidocaine  patch for pain management  Cognitive impairment and mood symptoms - Cognitive impairment observed during recent hospitalizations - Concerns for anxiety and depression - Treated with Wellbutrin  and Remeron  -seems to be improving - Family concerns regarding oxycodone  use leading to cognitive issues per nurse report   Hyponatremia - Developed hyponatremia during June hospitalization, treated with salt tablets - Sodium level on October 31, 2023 was 132 - No longer taking sodium tablets - Lexapro  discontinued due to low sodium levels  Edema - Bilateral leg edema present - Advised to wear compression hose, but not wearing them today  Chronic cough - Chronic dry cough hx - Treated with Protonix  - No shortness of breath - No history of lung disease  Hypertension - Blood pressure controlled with Norvasc  and atenolol    Hx of breast ca  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  Diverticulosis    Esophageal reflux    Family history of breast cancer 11/25/2021   Hiatal hernia    Hx of colonic polyps    Hypertension    Per new patient packet   Mild asthma    Per new patient packet    Osteoarthritis    Osteopenia    bone density 1-09 and 2-13   Pulmonary nodules    scattered-CT Scan July 2010, 12-10, and 04-2010-all stable felt benign    Past Surgical History:  Procedure Laterality Date   BREAST LUMPECTOMY WITH RADIOACTIVE SEED LOCALIZATION Left 12/07/2021   Procedure: LEFT BREAST LUMPECTOMY WITH RADIOACTIVE SEED LOCALIZATION;  Surgeon: Curvin Deward MOULD, MD;  Location: [facility];  Service: General;  Laterality: Left;   CHOLECYSTECTOMY  04/25/1972   Gall Bladder; [facility]   DG PORTABLE CHEST X RAY ([facility] HX)  2023   Shoulder   DIAGNOSTIC MAMMOGRAM  2022   [facility]; Per new patient packet   JOINT REPLACEMENT Left 08/24/2010   JOINT REPLACEMENT Left 04/25/2010   Reverse    ORIF PELVIC FRACTURE WITH PERCUTANEOUS SCREWS Right 10/30/2023   Procedure: CLOSED REDUCTION, PELVIS, WITH PERCUTANEOUS FIXATION;  Surgeon: Kendal Franky SQUIBB, MD;  Location: [facility] OR;  Service: Orthopedics;  Laterality: Right;   orthroscopic  Rotator Cuff Left 04/25/2009   Partial shoulder Left 04/26/2003   ROTATOR CUFF REPAIR     x 2 1998 and 1999   SPINE SURGERY  04/26/1983   disk   TONSILLECTOMY  04/25/1950   Dr. Jerome; Per new patient packet    Allergies  Allergen Reactions   Arimidex  [Anastrozole ] Other (See Comments)    Caused excessive sweating. Allergy not listed on MAR   Sulfa Antibiotics Other (See Comments)    The patient said she was told when she was 17 she was allergic to this.    Outpatient Encounter Medications as of 11/30/2023  Medication Sig   acetaminophen  (TYLENOL ) 500 MG tablet Take 500 mg by mouth every 8 (eight) hours as needed for mild pain (pain score 1-3) (or headaches).   Alum & Mag Hydroxide-Simeth (SG ANTACID LIQUID/SIMETHICONE PO) Take 30 mLs by mouth every 4 (four) hours as needed.   amLODipine  (NORVASC ) 5 MG tablet Take 2.5 mg by mouth in the morning.   aspirin  EC 325 MG tablet Take 1 tablet (325 mg total) by mouth daily.   atenolol  (TENORMIN ) 25  MG tablet Take 25 mg by mouth at bedtime.   buPROPion  (WELLBUTRIN  XL) 150 MG 24 hr tablet Take 1 tablet (150 mg total) by mouth daily.   Cholecalciferol  (VITAMIN D3) 1000 units CAPS Take 2,000 Units by mouth in the morning.   Cyanocobalamin  (B-12) 1000 MCG TABS Take 1,000 mcg by mouth in the morning.   diazepam  (VALIUM ) 2 MG tablet Take 1 tablet (2 mg total) by mouth every 6 (six) hours as needed for anxiety or muscle spasms (can use along with muscle relaxor).   diclofenac Sodium (VOLTAREN) 1 % GEL Apply 1 Application topically every 6 (six) hours as needed (for pain).   docusate sodium  (COLACE) 100 MG capsule Take 1 capsule (100 mg total) by mouth 2 (two) times daily.   folic acid  (FOLVITE ) 800 MCG tablet Take 800 mcg by mouth at bedtime.   hydrALAZINE  (APRESOLINE ) 10 MG tablet Take 1 tablet (10 mg total) by mouth as needed (for blood pressure 150 or above).   lidocaine  4 % Place 1 patch onto the skin in the morning and at bedtime.  methocarbamol  (ROBAXIN ) 500 MG tablet Take 1 tablet (500 mg total) by mouth every 6 (six) hours as needed for muscle spasms.   mirtazapine  (REMERON ) 15 MG tablet Take 7.5 mg by mouth at bedtime.   ondansetron  (ZOFRAN ) 4 MG tablet Take 4 mg by mouth every 6 (six) hours as needed for nausea or vomiting.   oxyCODONE  (OXY IR/ROXICODONE ) 5 MG immediate release tablet Take 0.5-1 tablets (2.5-5 mg total) by mouth every 4 (four) hours as needed for moderate pain (pain score 4-6) or severe pain (pain score 7-10) (2.5 mg pain score 4-6, 5 mg pain score 7-10).   pantoprazole  (PROTONIX ) 40 MG tablet Take 1 tablet (40 mg total) by mouth daily.   polyethylene glycol (MIRALAX  / GLYCOLAX ) 17 g packet Take 17 g by mouth daily.   SYSTANE COMPLETE PF 0.6 % SOLN Place 1 drop into both eyes 3 (three) times daily as needed (for dryness).   No facility-administered encounter medications on file as of 11/30/2023.    Review of Systems  Constitutional:  Negative for activity change,  appetite change, chills, diaphoresis, fatigue, fever and unexpected weight change.  HENT:  Negative for congestion.   Respiratory:  Negative for cough, shortness of breath and wheezing.   Cardiovascular:  Positive for leg swelling. Negative for chest pain and palpitations.  Gastrointestinal:  Negative for abdominal distention, abdominal pain, constipation and diarrhea.  Genitourinary:  Negative for difficulty urinating and dysuria.  Musculoskeletal:  Positive for arthralgias and gait problem. Negative for back pain, joint swelling and myalgias.  Neurological:  Negative for dizziness, tremors, seizures, syncope, facial asymmetry, speech difficulty, weakness, light-headedness, numbness and headaches.  Psychiatric/Behavioral:  Negative for agitation, behavioral problems and confusion. The patient is not nervous/anxious.     Immunization History  Administered Date(s) Administered   Fluad  Trivalent(High Dose 65+) 01/03/2023   Influenza Split 01/10/2011, 12/27/2011, 01/09/2012, 01/26/2015, 12/17/2017, 01/12/2019, 12/19/2019, 12/19/2020   Influenza-Unspecified 12/24/2021   Moderna Covid-19 Vaccine Bivalent Booster 83yrs & up 02/05/2021, 09/27/2021, 01/27/2022   Moderna SARS-COV2 Booster Vaccination 03/10/2020, 11/24/2020, 12/11/2021   Moderna Sars-Covid-2 Vaccination 05/07/2019, 06/04/2019   PNEUMOCOCCAL CONJUGATE-20 04/01/2022   Pfizer(Comirnaty )Fall Seasonal Vaccine 12 years and older 01/03/2023, 07/25/2023   Pneumococcal Polysaccharide-23 08/02/2016, 07/21/2020, 11/10/2020   Respiratory Syncytial Virus Vaccine ,Recomb Aduvanted(Arexvy ) 04/14/2022   Tdap 06/13/2022   Zoster Recombinant(Shingrix) 09/06/2016, 11/22/2016   Pertinent  Health Maintenance Due  Topic Date Due   INFLUENZA VACCINE  11/24/2023   DEXA SCAN  Completed      11/14/2022    3:28 PM 11/15/2022    2:12 PM 04/04/2023    9:21 AM 06/06/2023    3:57 PM 07/24/2023    1:11 PM  Fall Risk  Falls in the past year? 0 0 1 1 1   Was  there an injury with Fall? 0 0 1 1 1   Fall Risk Category Calculator 0 0 2 2 2   Patient at Risk for Falls Due to No Fall Risks No Fall Risks History of fall(s) History of fall(s);Impaired balance/gait No Fall Risks  Fall risk Follow up Falls evaluation completed Falls evaluation completed Falls evaluation completed Falls evaluation completed Falls evaluation completed   Functional Status Survey:    Vitals:   11/30/23 0904  BP: 102/60  Pulse: 69  Resp: 14  Temp: 98.2 F (36.8 C)  SpO2: 98%  Weight: 104 lb (47.2 kg)  Height: 5' 2 (1.575 m)   Body mass index is 19.02 kg/m. Physical Exam Vitals reviewed.  Constitutional:  Appearance: Normal appearance.  Cardiovascular:     Rate and Rhythm: Normal rate and regular rhythm.  Pulmonary:     Effort: Pulmonary effort is normal.     Breath sounds: Normal breath sounds.  Abdominal:     General: Bowel sounds are normal. There is no distension.     Palpations: Abdomen is soft.     Tenderness: There is no abdominal tenderness.  Musculoskeletal:     Right lower leg: Edema (+2) present.     Left lower leg: Edema (+1) present.  Neurological:     General: No focal deficit present.     Mental Status: She is alert. Mental status is at baseline.  Psychiatric:        Mood and Affect: Mood normal.     Labs reviewed: Recent Labs    10/08/23 1646 10/09/23 0509 10/12/23 0522 10/16/23 0000 10/28/23 0406 10/29/23 0526 10/31/23 0703  NA  --    < > 131*   < > 131* 130* 132*  K  --    < > 3.8   < > 4.3 4.6 3.9  CL  --    < > 98   < > 96* 96* 100  CO2  --    < > 23   < > 23 25 23   GLUCOSE  --    < > 94   < > 102* 101* 117*  BUN  --    < > 11   < > 10 9 13   CREATININE  --    < > 0.42*   < > 0.53 0.55 0.53  CALCIUM   --    < > 8.4*   < > 8.9 8.6* 8.4*  MG 1.9  --   --   --   --   --   --   PHOS  --   --  3.6  --   --   --   --    < > = values in this interval not displayed.   Recent Labs    04/13/23 0000 09/26/23 0000  09/26/23 1020 10/12/23 0522  AST 19 19  --   --   ALT 12 13  --   --   ALKPHOS 55 66  --   --   ALBUMIN 4.2  --  4.2 3.1*   Recent Labs    10/26/23 0929 10/27/23 0421 10/29/23 0526 10/31/23 0703 11/01/23 0605  WBC 7.2 7.6 8.7 9.6 8.4  NEUTROABS 5.6 5.7 6.4  --   --   HGB 10.3* 11.6* 11.1* 10.4* 10.5*  HCT 30.9* 35.3* 32.2* 31.3* 31.7*  MCV 84.0 82.7 81.7 82.6 82.1  PLT 394 446* 437* 395 340   Lab Results  Component Value Date   TSH 0.947 10/11/2023   Lab Results  Component Value Date   HGBA1C 5.5 06/08/2022   Lab Results  Component Value Date   CHOL 136 04/13/2023   HDL 57 04/13/2023   LDLCALC 63 04/13/2023   TRIG 83 04/13/2023    Significant Diagnostic Results in last 30 days:  No results found.  Assessment/Plan  Right pelvic fractures (pubic ramus, ilium, hemisacrum) status post surgical stabilization Status post surgical stabilization with percutaneous screws on October 30, 2023. She is now bearing weight and has been released to independence with her walker. - Continue follow-up with orthopedics as indicated.  Right distal fibular fracture Acute nondisplaced fracture of the far distal anterior superior fibular cortex identified on CT of the ankle on October 26, 2023. Recommended to weight bear as tolerated. Possible soft tissue injury per ortho. No longer experiencing pain, but some swelling persists. Concerns about oxycodone  affecting cognition. - Reduce oxycodone  to 2.5 mg BID PRN. - Schedule Tylenol  1 gram BID for four weeks. - Continue lidocaine  patch.  Osteopenia with history of pelvic fractures Osteopenia with recent pelvic fractures. Bone density on April 26, 2022, showed left femur T score of -1.9 and right femur T score of 2.3. Potential benefit from osteoporosis medication will f/u once she is in [place]  Mild cognitive impairment Mild cognitive impairment noted during hospital stay. Best suited for assisted living, and plans are in place for her to move  there.  Depression Depression managed with Wellbutrin  and Remeron . Avoiding SSRIs due to prior history of hyponatremia.  Bilateral lower extremity edema Bilateral lower extremity edema present. - Recommend wearing compression hose.  Chronic cough Chronic dry cough with no shortness of breath or history of lung disease. - Managed with Protonix .  Vitamin D  deficiency Vitamin D  deficiency managed with vitamin D3 supplementation.  Mild anemia, likely post-surgical Mild anemia with last hemoglobin of 10.5 on November 01, 2023, with normal MCV. Likely due to blood loss from surgery. - Monitoring recommended.  Essential hypertension Essential hypertension well controlled on Norvasc  and atenolol .  Hyponatremia Check BMP  Labs/tests ordered:  CBC BMP 8/11

## 2023-12-01 DIAGNOSIS — R41841 Cognitive communication deficit: Secondary | ICD-10-CM | POA: Diagnosis not present

## 2023-12-01 DIAGNOSIS — S32591S Other specified fracture of right pubis, sequela: Secondary | ICD-10-CM | POA: Diagnosis not present

## 2023-12-01 DIAGNOSIS — R531 Weakness: Secondary | ICD-10-CM | POA: Diagnosis not present

## 2023-12-01 DIAGNOSIS — Z9181 History of falling: Secondary | ICD-10-CM | POA: Diagnosis not present

## 2023-12-01 DIAGNOSIS — R42 Dizziness and giddiness: Secondary | ICD-10-CM | POA: Diagnosis not present

## 2023-12-01 DIAGNOSIS — M6281 Muscle weakness (generalized): Secondary | ICD-10-CM | POA: Diagnosis not present

## 2023-12-01 DIAGNOSIS — M25551 Pain in right hip: Secondary | ICD-10-CM | POA: Diagnosis not present

## 2023-12-01 DIAGNOSIS — R2689 Other abnormalities of gait and mobility: Secondary | ICD-10-CM | POA: Diagnosis not present

## 2023-12-04 DIAGNOSIS — E871 Hypo-osmolality and hyponatremia: Secondary | ICD-10-CM | POA: Diagnosis not present

## 2023-12-04 DIAGNOSIS — Z9181 History of falling: Secondary | ICD-10-CM | POA: Diagnosis not present

## 2023-12-04 DIAGNOSIS — M25551 Pain in right hip: Secondary | ICD-10-CM | POA: Diagnosis not present

## 2023-12-04 DIAGNOSIS — R42 Dizziness and giddiness: Secondary | ICD-10-CM | POA: Diagnosis not present

## 2023-12-04 DIAGNOSIS — M6281 Muscle weakness (generalized): Secondary | ICD-10-CM | POA: Diagnosis not present

## 2023-12-04 DIAGNOSIS — R2689 Other abnormalities of gait and mobility: Secondary | ICD-10-CM | POA: Diagnosis not present

## 2023-12-04 DIAGNOSIS — S32591S Other specified fracture of right pubis, sequela: Secondary | ICD-10-CM | POA: Diagnosis not present

## 2023-12-04 DIAGNOSIS — R41841 Cognitive communication deficit: Secondary | ICD-10-CM | POA: Diagnosis not present

## 2023-12-04 LAB — CBC AND DIFFERENTIAL
HCT: 32 — AB (ref 36–46)
Hemoglobin: 11 — AB (ref 12.0–16.0)
Platelets: 242 K/uL (ref 150–400)
WBC: 6.3

## 2023-12-04 LAB — BASIC METABOLIC PANEL WITH GFR
BUN: 15 (ref 4–21)
CO2: 25 — AB (ref 13–22)
Chloride: 102 (ref 99–108)
Creatinine: 0.6 (ref 0.5–1.1)
Glucose: 91
Potassium: 4.4 meq/L (ref 3.5–5.1)
Sodium: 135 — AB (ref 137–147)

## 2023-12-04 LAB — COMPREHENSIVE METABOLIC PANEL WITH GFR
Calcium: 9.2 (ref 8.7–10.7)
eGFR: 84

## 2023-12-04 LAB — CBC: RBC: 3.99 (ref 3.87–5.11)

## 2023-12-05 DIAGNOSIS — Z9181 History of falling: Secondary | ICD-10-CM | POA: Diagnosis not present

## 2023-12-05 DIAGNOSIS — S32591S Other specified fracture of right pubis, sequela: Secondary | ICD-10-CM | POA: Diagnosis not present

## 2023-12-05 DIAGNOSIS — M25551 Pain in right hip: Secondary | ICD-10-CM | POA: Diagnosis not present

## 2023-12-05 DIAGNOSIS — R2689 Other abnormalities of gait and mobility: Secondary | ICD-10-CM | POA: Diagnosis not present

## 2023-12-05 DIAGNOSIS — M6281 Muscle weakness (generalized): Secondary | ICD-10-CM | POA: Diagnosis not present

## 2023-12-05 DIAGNOSIS — R41841 Cognitive communication deficit: Secondary | ICD-10-CM | POA: Diagnosis not present

## 2023-12-05 DIAGNOSIS — R42 Dizziness and giddiness: Secondary | ICD-10-CM | POA: Diagnosis not present

## 2023-12-06 DIAGNOSIS — S32591S Other specified fracture of right pubis, sequela: Secondary | ICD-10-CM | POA: Diagnosis not present

## 2023-12-06 DIAGNOSIS — M6281 Muscle weakness (generalized): Secondary | ICD-10-CM | POA: Diagnosis not present

## 2023-12-06 DIAGNOSIS — M25551 Pain in right hip: Secondary | ICD-10-CM | POA: Diagnosis not present

## 2023-12-06 DIAGNOSIS — R42 Dizziness and giddiness: Secondary | ICD-10-CM | POA: Diagnosis not present

## 2023-12-06 DIAGNOSIS — Z9181 History of falling: Secondary | ICD-10-CM | POA: Diagnosis not present

## 2023-12-06 DIAGNOSIS — R2689 Other abnormalities of gait and mobility: Secondary | ICD-10-CM | POA: Diagnosis not present

## 2023-12-06 DIAGNOSIS — R531 Weakness: Secondary | ICD-10-CM | POA: Diagnosis not present

## 2023-12-06 DIAGNOSIS — R41841 Cognitive communication deficit: Secondary | ICD-10-CM | POA: Diagnosis not present

## 2023-12-07 DIAGNOSIS — M25551 Pain in right hip: Secondary | ICD-10-CM | POA: Diagnosis not present

## 2023-12-07 DIAGNOSIS — R2689 Other abnormalities of gait and mobility: Secondary | ICD-10-CM | POA: Diagnosis not present

## 2023-12-07 DIAGNOSIS — R531 Weakness: Secondary | ICD-10-CM | POA: Diagnosis not present

## 2023-12-07 DIAGNOSIS — M6281 Muscle weakness (generalized): Secondary | ICD-10-CM | POA: Diagnosis not present

## 2023-12-07 DIAGNOSIS — Z9181 History of falling: Secondary | ICD-10-CM | POA: Diagnosis not present

## 2023-12-07 DIAGNOSIS — S32591S Other specified fracture of right pubis, sequela: Secondary | ICD-10-CM | POA: Diagnosis not present

## 2023-12-07 DIAGNOSIS — R41841 Cognitive communication deficit: Secondary | ICD-10-CM | POA: Diagnosis not present

## 2023-12-07 DIAGNOSIS — R42 Dizziness and giddiness: Secondary | ICD-10-CM | POA: Diagnosis not present

## 2023-12-08 DIAGNOSIS — R531 Weakness: Secondary | ICD-10-CM | POA: Diagnosis not present

## 2023-12-08 DIAGNOSIS — R42 Dizziness and giddiness: Secondary | ICD-10-CM | POA: Diagnosis not present

## 2023-12-08 DIAGNOSIS — Z9181 History of falling: Secondary | ICD-10-CM | POA: Diagnosis not present

## 2023-12-08 DIAGNOSIS — M25551 Pain in right hip: Secondary | ICD-10-CM | POA: Diagnosis not present

## 2023-12-08 DIAGNOSIS — S32591S Other specified fracture of right pubis, sequela: Secondary | ICD-10-CM | POA: Diagnosis not present

## 2023-12-08 DIAGNOSIS — R41841 Cognitive communication deficit: Secondary | ICD-10-CM | POA: Diagnosis not present

## 2023-12-08 DIAGNOSIS — R2689 Other abnormalities of gait and mobility: Secondary | ICD-10-CM | POA: Diagnosis not present

## 2023-12-08 DIAGNOSIS — M6281 Muscle weakness (generalized): Secondary | ICD-10-CM | POA: Diagnosis not present

## 2023-12-09 DIAGNOSIS — R531 Weakness: Secondary | ICD-10-CM | POA: Diagnosis not present

## 2023-12-09 DIAGNOSIS — Z9181 History of falling: Secondary | ICD-10-CM | POA: Diagnosis not present

## 2023-12-10 DIAGNOSIS — R531 Weakness: Secondary | ICD-10-CM | POA: Diagnosis not present

## 2023-12-10 DIAGNOSIS — Z9181 History of falling: Secondary | ICD-10-CM | POA: Diagnosis not present

## 2023-12-11 DIAGNOSIS — M25551 Pain in right hip: Secondary | ICD-10-CM | POA: Diagnosis not present

## 2023-12-11 DIAGNOSIS — Z9181 History of falling: Secondary | ICD-10-CM | POA: Diagnosis not present

## 2023-12-11 DIAGNOSIS — R531 Weakness: Secondary | ICD-10-CM | POA: Diagnosis not present

## 2023-12-11 DIAGNOSIS — M6281 Muscle weakness (generalized): Secondary | ICD-10-CM | POA: Diagnosis not present

## 2023-12-11 DIAGNOSIS — S32591S Other specified fracture of right pubis, sequela: Secondary | ICD-10-CM | POA: Diagnosis not present

## 2023-12-11 DIAGNOSIS — R2689 Other abnormalities of gait and mobility: Secondary | ICD-10-CM | POA: Diagnosis not present

## 2023-12-11 DIAGNOSIS — R42 Dizziness and giddiness: Secondary | ICD-10-CM | POA: Diagnosis not present

## 2023-12-11 DIAGNOSIS — R41841 Cognitive communication deficit: Secondary | ICD-10-CM | POA: Diagnosis not present

## 2023-12-12 DIAGNOSIS — Z9181 History of falling: Secondary | ICD-10-CM | POA: Diagnosis not present

## 2023-12-12 DIAGNOSIS — R531 Weakness: Secondary | ICD-10-CM | POA: Diagnosis not present

## 2023-12-12 DIAGNOSIS — S32591S Other specified fracture of right pubis, sequela: Secondary | ICD-10-CM | POA: Diagnosis not present

## 2023-12-12 DIAGNOSIS — R2689 Other abnormalities of gait and mobility: Secondary | ICD-10-CM | POA: Diagnosis not present

## 2023-12-12 DIAGNOSIS — M25551 Pain in right hip: Secondary | ICD-10-CM | POA: Diagnosis not present

## 2023-12-12 DIAGNOSIS — R41841 Cognitive communication deficit: Secondary | ICD-10-CM | POA: Diagnosis not present

## 2023-12-12 DIAGNOSIS — M6281 Muscle weakness (generalized): Secondary | ICD-10-CM | POA: Diagnosis not present

## 2023-12-12 DIAGNOSIS — R42 Dizziness and giddiness: Secondary | ICD-10-CM | POA: Diagnosis not present

## 2023-12-13 DIAGNOSIS — M25551 Pain in right hip: Secondary | ICD-10-CM | POA: Diagnosis not present

## 2023-12-13 DIAGNOSIS — S32591S Other specified fracture of right pubis, sequela: Secondary | ICD-10-CM | POA: Diagnosis not present

## 2023-12-13 DIAGNOSIS — M6281 Muscle weakness (generalized): Secondary | ICD-10-CM | POA: Diagnosis not present

## 2023-12-13 DIAGNOSIS — R531 Weakness: Secondary | ICD-10-CM | POA: Diagnosis not present

## 2023-12-13 DIAGNOSIS — R42 Dizziness and giddiness: Secondary | ICD-10-CM | POA: Diagnosis not present

## 2023-12-13 DIAGNOSIS — R41841 Cognitive communication deficit: Secondary | ICD-10-CM | POA: Diagnosis not present

## 2023-12-13 DIAGNOSIS — R2689 Other abnormalities of gait and mobility: Secondary | ICD-10-CM | POA: Diagnosis not present

## 2023-12-13 DIAGNOSIS — Z9181 History of falling: Secondary | ICD-10-CM | POA: Diagnosis not present

## 2023-12-14 DIAGNOSIS — Z9181 History of falling: Secondary | ICD-10-CM | POA: Diagnosis not present

## 2023-12-14 DIAGNOSIS — R41841 Cognitive communication deficit: Secondary | ICD-10-CM | POA: Diagnosis not present

## 2023-12-14 DIAGNOSIS — R531 Weakness: Secondary | ICD-10-CM | POA: Diagnosis not present

## 2023-12-14 DIAGNOSIS — R42 Dizziness and giddiness: Secondary | ICD-10-CM | POA: Diagnosis not present

## 2023-12-15 DIAGNOSIS — Z9181 History of falling: Secondary | ICD-10-CM | POA: Diagnosis not present

## 2023-12-15 DIAGNOSIS — R531 Weakness: Secondary | ICD-10-CM | POA: Diagnosis not present

## 2023-12-15 DIAGNOSIS — R41841 Cognitive communication deficit: Secondary | ICD-10-CM | POA: Diagnosis not present

## 2023-12-15 DIAGNOSIS — R42 Dizziness and giddiness: Secondary | ICD-10-CM | POA: Diagnosis not present

## 2023-12-18 ENCOUNTER — Non-Acute Institutional Stay (SKILLED_NURSING_FACILITY): Payer: Self-pay | Admitting: Adult Health

## 2023-12-18 ENCOUNTER — Encounter: Payer: Self-pay | Admitting: Adult Health

## 2023-12-18 DIAGNOSIS — E559 Vitamin D deficiency, unspecified: Secondary | ICD-10-CM | POA: Diagnosis not present

## 2023-12-18 DIAGNOSIS — G3184 Mild cognitive impairment, so stated: Secondary | ICD-10-CM | POA: Diagnosis not present

## 2023-12-18 DIAGNOSIS — D649 Anemia, unspecified: Secondary | ICD-10-CM | POA: Diagnosis not present

## 2023-12-18 DIAGNOSIS — R053 Chronic cough: Secondary | ICD-10-CM | POA: Diagnosis not present

## 2023-12-18 DIAGNOSIS — I10 Essential (primary) hypertension: Secondary | ICD-10-CM

## 2023-12-18 DIAGNOSIS — F329 Major depressive disorder, single episode, unspecified: Secondary | ICD-10-CM

## 2023-12-18 DIAGNOSIS — E871 Hypo-osmolality and hyponatremia: Secondary | ICD-10-CM | POA: Diagnosis not present

## 2023-12-18 DIAGNOSIS — R42 Dizziness and giddiness: Secondary | ICD-10-CM | POA: Diagnosis not present

## 2023-12-18 DIAGNOSIS — M8580 Other specified disorders of bone density and structure, unspecified site: Secondary | ICD-10-CM

## 2023-12-18 DIAGNOSIS — S32810D Multiple fractures of pelvis with stable disruption of pelvic ring, subsequent encounter for fracture with routine healing: Secondary | ICD-10-CM

## 2023-12-18 DIAGNOSIS — R41841 Cognitive communication deficit: Secondary | ICD-10-CM | POA: Diagnosis not present

## 2023-12-18 NOTE — Progress Notes (Addendum)
 Location:  [facility] Room Number: 156-P Place of Service:  SNF ([phone])  Provider: Darlean Maus, NP  PCP: Charlanne Fredia CROME, MD Patient Care Team: Charlanne Fredia CROME, MD as PCP - General (Internal Medicine) Lonni Slain, MD as PCP - Cardiology (Cardiology) Cary Doffing, MD as Attending Physician (Dermatology) Rosalva Sawyer, MD as Consulting Physician (Obstetrics and Gynecology) Leslee Reusing, MD as Consulting Physician (Ophthalmology) Glean Stephane BROCKS, RN (Inactive) as Oncology Nurse Navigator Tyree Nanetta SAILOR, RN as Oncology Nurse Navigator Curvin Deward MOULD, MD as Consulting Physician (General Surgery) Lanny Callander, MD as Consulting Physician (Hematology) Shannon Agent, MD as Consulting Physician (Radiation Oncology) Hunsucker, Donnice SAUNDERS, MD as Consulting Physician (Pulmonary Disease)  Extended Emergency Contact Information Primary Emergency Contact: Bathgate,William Chip  [place] Home Phone: [phone] Work Phone: [phone] Mobile Phone: [phone] Relation: Son Secondary Emergency Contact: Ryan,Liz Address: [address] [place], [place] [address] [place] Phone: [phone] Relation: Daughter  Code Status: DNR Goals of care:  Advanced Directive information    10/25/2023    6:05 PM  Advanced Directives  Does Patient Have a Medical Advance Directive? Yes  Type of Advance Directive Out of facility DNR (pink MOST or yellow form)  Does patient want to make changes to medical advance directive? Yes (ED - send information to MyChart)  Pre-existing out of facility DNR order (yellow form or pink MOST form) Pink MOST/Yellow Form most recent copy in chart - Physician notified to receive inpatient order     Allergies  Allergen Reactions   Arimidex  [Anastrozole ] Other (See Comments)    Caused excessive sweating. Allergy not listed on MAR   Sulfa Antibiotics Other (See Comments)    The  patient said she was told when she was 17 she was allergic to this.    Chief Complaint  Patient presents with   Discharge Note    HPI:  89 y.o. female seen for discharge from [facility] to assisted living  Musculoskeletal trauma and pain - Hospitalized June 15-19, 2025 for a fall resulting in a right pelvic fracture with 7 mm displacement - Experienced right foot injury with soft tissue and cortical thickening on October 20, 2023 - Readmitted July 2-11, 2025 for intractable pain; imaging revealed fractures of the right ilium and hemisacrum - Underwent surgical stabilization with percutaneous screws on October 30, 2023 - Currently bearing weight and ambulates independently with a walker - CT on October 26, 2023 showed acute nondisplaced fracture of the distal anterior superior fibular cortex - Right foot is no longer painful, but some swelling persists - Uses lidocaine  patch for pain management  Cognitive impairment and mood symptoms - Cognitive impairment observed during recent hospitalizations - Concerns for anxiety and depression - Treated with Wellbutrin  and Remeron  -seems to be improving - Family concerns regarding oxycodone  use leading to cognitive issues per nurse report   Hyponatremia - Developed hyponatremia during June hospitalization, treated with salt tablets - improved to 135 12/04/23 - No longer taking sodium tablets - Lexapro  discontinued due to low sodium levels  Edema - Bilateral leg edema present - Advised to wear compression hose, but not wearing them today  Chronic cough - Chronic dry cough hx - Treated with Protonix  - No shortness of breath - No history of lung disease  Hypertension - Blood pressure controlled with Norvasc  and atenolol    Hx of breast ca     Past Medical  History:  Diagnosis Date   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   Diverticulosis    Esophageal reflux    Family history of breast cancer 11/25/2021   Hiatal hernia     Hx of colonic polyps    Hypertension    Per new patient packet   Mild asthma    Per new patient packet   Osteoarthritis    Osteopenia    bone density 1-09 and 2-13   Pulmonary nodules    scattered-CT Scan July 2010, 12-10, and 04-2010-all stable felt benign     Past Surgical History:  Procedure Laterality Date   BREAST LUMPECTOMY WITH RADIOACTIVE SEED LOCALIZATION Left 12/07/2021   Procedure: LEFT BREAST LUMPECTOMY WITH RADIOACTIVE SEED LOCALIZATION;  Surgeon: Curvin Deward MOULD, MD;  Location: [facility];  Service: General;  Laterality: Left;   CHOLECYSTECTOMY  04/25/1972   Gall Bladder; [facility]   DG PORTABLE CHEST X RAY ([facility] HX)  2023   Shoulder   DIAGNOSTIC MAMMOGRAM  2022   [facility]; Per new patient packet   JOINT REPLACEMENT Left 08/24/2010   JOINT REPLACEMENT Left 04/25/2010   Reverse    ORIF PELVIC FRACTURE WITH PERCUTANEOUS SCREWS Right 10/30/2023   Procedure: CLOSED REDUCTION, PELVIS, WITH PERCUTANEOUS FIXATION;  Surgeon: Kendal Franky SQUIBB, MD;  Location: [facility] OR;  Service: Orthopedics;  Laterality: Right;   orthroscopic  Rotator Cuff Left 04/25/2009   Partial shoulder Left 04/26/2003   ROTATOR CUFF REPAIR     x 2 1998 and 1999   SPINE SURGERY  04/26/1983   disk   TONSILLECTOMY  04/25/1950   Dr. Jerome; Per new patient packet      reports that she has never smoked. She has never used smokeless tobacco. She reports that she does not currently use alcohol. She reports that she does not use drugs. Social History   Socioeconomic History   Marital status: Widowed    Spouse name: Not on file   Number of children: 2   Years of education: Not on file   Highest education level: Not on file  Occupational History   Occupation: Retired  Tobacco Use   Smoking status: Never   Smokeless tobacco: Never  Vaping Use   Vaping status: Never Used  Substance and Sexual Activity   Alcohol use: Not Currently   Drug use: No   Sexual activity: Not Currently    Birth  control/protection: Post-menopausal  Other Topics Concern   Not on file  Social History Narrative   Diet: Left blank      Caffeine: Yes      Married, if yes what year: Widow; married in 1957       Do you live in a house, apartment, assisted living, condo, trailer, ect: [facility]       Is it one or more stories: Amedeo       How many persons live in your home? One      Pets: Cat      Highest level or education completed: Left blank      Current/Past profession: Artist       Exercise: Yes                 Type and how often: Walk         Living Will: Yes   DNR: Yes    POA/HPOA: Left blank       Functional Status:   Do you have difficulty bathing or dressing  yourself? Left blank    Do you have difficulty preparing food or eating? Left blank    Do you have difficulty managing your medications? Left blank    Do you have difficulty managing your finances? Left blank    Do you have difficulty affording your medications? Left blank    Social Drivers of Health   Financial Resource Strain: Low Risk  (12/21/2021)   Overall Financial Resource Strain (CARDIA)    Difficulty of Paying Living Expenses: Not hard at all  Food Insecurity: No Food Insecurity (10/25/2023)   Hunger Vital Sign    Worried About Running Out of Food in the Last Year: Never true    Ran Out of Food in the Last Year: Never true  Transportation Needs: No Transportation Needs (10/25/2023)   PRAPARE - Administrator, Civil Service (Medical): No    Lack of Transportation (Non-Medical): No  Physical Activity: Not on file  Stress: Not on file  Social Connections: Moderately Isolated (10/25/2023)   Social Connection and Isolation Panel    Frequency of Communication with Friends and Family: More than three times a week    Frequency of Social Gatherings with Friends and Family: Three times a week    Attends Religious Services: Patient declined    Active Member of Clubs or Organizations: Patient  declined    Attends Banker Meetings: More than 4 times per year    Marital Status: Widowed  Intimate Partner Violence: Not At Risk (10/25/2023)   Humiliation, Afraid, Rape, and Kick questionnaire    Fear of Current or Ex-Partner: No    Emotionally Abused: No    Physically Abused: No    Sexually Abused: No   Functional Status Survey:    Allergies  Allergen Reactions   Arimidex  [Anastrozole ] Other (See Comments)    Caused excessive sweating. Allergy not listed on MAR   Sulfa Antibiotics Other (See Comments)    The patient said she was told when she was 17 she was allergic to this.    Pertinent  Health Maintenance Due  Topic Date Due   INFLUENZA VACCINE  11/24/2023   DEXA SCAN  Completed    Medications: Outpatient Encounter Medications as of 12/18/2023  Medication Sig   acetaminophen  (TYLENOL ) 500 MG tablet Take 2 tablets (1,000 mg total) by mouth 2 (two) times daily for 28 days.   amLODipine  (NORVASC ) 5 MG tablet Take 2.5 mg by mouth in the morning.   aspirin  325 MG tablet Take 325 mg by mouth daily.   atenolol  (TENORMIN ) 25 MG tablet Take 25 mg by mouth at bedtime.   buPROPion  (WELLBUTRIN  XL) 150 MG 24 hr tablet Take 1 tablet (150 mg total) by mouth daily.   Cholecalciferol  (VITAMIN D3) 1000 units CAPS Take 2,000 Units by mouth in the morning.   Cyanocobalamin  (B-12) 1000 MCG TABS Take 1,000 mcg by mouth in the morning.   docusate sodium  (COLACE) 100 MG capsule Take 1 capsule (100 mg total) by mouth 2 (two) times daily.   folic acid  (FOLVITE ) 800 MCG tablet Take 800 mcg by mouth at bedtime.   lidocaine  4 % Place 1 patch onto the skin in the morning and at bedtime.   mirtazapine  (REMERON ) 15 MG tablet Take 7.5 mg by mouth at bedtime.   pantoprazole  (PROTONIX ) 40 MG tablet Take 1 tablet (40 mg total) by mouth daily.   polyethylene glycol (MIRALAX  / GLYCOLAX ) 17 g packet Take 17 g by mouth daily.   acetaminophen  (TYLENOL )  500 MG tablet Take 500 mg by mouth every 8  (eight) hours as needed for mild pain (pain score 1-3) (or headaches).   Alum & Mag Hydroxide-Simeth (SG ANTACID LIQUID/SIMETHICONE PO) Take 30 mLs by mouth every 4 (four) hours as needed.   diazepam  (VALIUM ) 2 MG tablet Take 1 tablet (2 mg total) by mouth every 6 (six) hours as needed for anxiety or muscle spasms (can use along with muscle relaxor).   diclofenac Sodium (VOLTAREN) 1 % GEL Apply 1 Application topically every 6 (six) hours as needed (for pain).   hydrALAZINE  (APRESOLINE ) 10 MG tablet Take 1 tablet (10 mg total) by mouth as needed (for blood pressure 150 or above).   loperamide (IMODIUM A-D) 2 MG tablet Take 4 mg by mouth every 6 (six) hours as needed for diarrhea or loose stools.   magnesium hydroxide (MILK OF MAGNESIA) 400 MG/5ML suspension Take 30 mLs by mouth daily as needed for mild constipation.   melatonin 5 MG TABS Take 5 mg by mouth as needed (For insomnia).   methocarbamol  (ROBAXIN ) 500 MG tablet Take 1 tablet (500 mg total) by mouth every 6 (six) hours as needed for muscle spasms.   ondansetron  (ZOFRAN ) 4 MG tablet Take 4 mg by mouth every 6 (six) hours as needed for nausea or vomiting.   oxycodone  (OXY-IR) 5 MG capsule Take 2.5 mg by mouth as needed for pain (X 14 days).   SYSTANE COMPLETE PF 0.6 % SOLN Place 1 drop into both eyes 3 (three) times daily as needed (for dryness).   No facility-administered encounter medications on file as of 12/18/2023.    Review of Systems  Constitutional:  Negative for activity change, appetite change, chills, diaphoresis, fatigue and fever.  HENT:  Negative for congestion.   Respiratory:  Negative for cough, shortness of breath and wheezing.   Cardiovascular:  Positive for leg swelling. Negative for chest pain.  Gastrointestinal:  Negative for abdominal distention, abdominal pain, constipation, diarrhea, nausea and vomiting.  Genitourinary:  Negative for difficulty urinating, dysuria and urgency.  Musculoskeletal:  Positive for gait  problem (uses walker). Negative for back pain, myalgias and neck pain.  Skin:  Negative for rash.  Neurological:  Negative for dizziness and weakness.  Psychiatric/Behavioral:  Negative for confusion.        Memory loss     Vitals:   12/18/23 0946  BP: 127/64  Pulse: 76  SpO2: 97%  Weight: 103 lb 9.6 oz (47 kg)  Height: 5' 2 (1.575 m)   Body mass index is 18.95 kg/m. Physical Exam Vitals reviewed.  Constitutional:      General: She is not in acute distress.    Appearance: She is not diaphoretic.  HENT:     Head: Normocephalic and atraumatic.  Neck:     Vascular: No JVD.  Cardiovascular:     Rate and Rhythm: Normal rate and regular rhythm.     Heart sounds: No murmur heard. Pulmonary:     Effort: Pulmonary effort is normal. No respiratory distress.     Breath sounds: Normal breath sounds. No wheezing.  Musculoskeletal:     Right lower leg: Edema present.     Left lower leg: Edema present.  Skin:    General: Skin is warm and dry.  Neurological:     General: No focal deficit present.     Mental Status: She is alert. Mental status is at baseline.     Labs reviewed: Basic Metabolic Panel: Recent Labs  10/08/23 1646 10/09/23 0509 10/12/23 0522 10/16/23 0000 10/28/23 0406 10/29/23 0526 10/31/23 0703 12/04/23 0000  NA  --    < > 131*   < > 131* 130* 132* 135*  K  --    < > 3.8   < > 4.3 4.6 3.9 4.4  CL  --    < > 98   < > 96* 96* 100 102  CO2  --    < > 23   < > 23 25 23  25*  GLUCOSE  --    < > 94   < > 102* 101* 117*  --   BUN  --    < > 11   < > 10 9 13 15   CREATININE  --    < > 0.42*   < > 0.53 0.55 0.53 0.6  CALCIUM   --    < > 8.4*   < > 8.9 8.6* 8.4* 9.2  MG 1.9  --   --   --   --   --   --   --   PHOS  --   --  3.6  --   --   --   --   --    < > = values in this interval not displayed.   Liver Function Tests: Recent Labs    04/13/23 0000 09/26/23 0000 09/26/23 1020 10/12/23 0522  AST 19 19  --   --   ALT 12 13  --   --   ALKPHOS 55 66  --    --   ALBUMIN 4.2  --  4.2 3.1*   No results for input(s): LIPASE, AMYLASE in the last 8760 hours. No results for input(s): AMMONIA in the last 8760 hours. CBC: Recent Labs    10/26/23 0929 10/27/23 0421 10/29/23 0526 10/31/23 0703 11/01/23 0605 12/04/23 0000  WBC 7.2 7.6 8.7 9.6 8.4 6.3  NEUTROABS 5.6 5.7 6.4  --   --   --   HGB 10.3* 11.6* 11.1* 10.4* 10.5* 11.0*  HCT 30.9* 35.3* 32.2* 31.3* 31.7* 32*  MCV 84.0 82.7 81.7 82.6 82.1  --   PLT 394 446* 437* 395 340 242   Cardiac Enzymes: Recent Labs    10/08/23 1646 10/27/23 1510  CKTOTAL 89 25*   BNP: Invalid input(s): POCBNP CBG: No results for input(s): GLUCAP in the last 8760 hours.  Procedures and Imaging Studies During Stay: No results found.  Assessment/Plan:     Right pelvic fractures (pubic ramus, ilium, hemisacrum) status post surgical stabilization Status post surgical stabilization with percutaneous screws on October 30, 2023. She is now bearing weight and has been released to independence with her walker. - Continue follow-up with orthopedics as indicated. Resolving Discharge to AL and f/u in 2 week s  Right distal fibular fracture Acute nondisplaced fracture of the far distal anterior superior fibular cortex identified on CT of the ankle on October 26, 2023. Recommended to weight bear as tolerated. Possible soft tissue injury per ortho.  Resolving   Osteopenia with history of pelvic fractures Osteopenia with recent pelvic fractures. Bone density on April 26, 2022, showed left femur T score of -1.9 and right femur T score of 2.3. Potential benefit from osteoporosis medication will f/u once she is in AL  Mild cognitive impairment Mild cognitive impairment noted during hospital stay. Best suited for assisted living, and plans are in place for her to move there.  Depression Depression managed with Wellbutrin  and Remeron . Avoiding SSRIs due  to prior history of hyponatremia.  Bilateral lower  extremity edema Bilateral lower extremity edema present. - Recommend wearing compression hose.  Chronic cough Chronic dry cough with no shortness of breath or history of lung disease. - Managed with Protonix .  Vitamin D  deficiency Vitamin D  deficiency managed with vitamin D3 supplementation.  Mild anemia, likely post-surgical Improved Hgb 11 12/04/23  Essential hypertension Essential hypertension well controlled on Norvasc  and atenolol .  Hyponatremia Improved NA 135  Patient has been advised to f/u with their PCP in 1-2 weeks to for a transitions of care visit.  Social services at their facility was responsible for arranging this appointment.  Pt was provided with adequate prescriptions of noncontrolled medications to reach the scheduled appointment .  For controlled substances, a limited supply was provided as appropriate for the individual patient.  If the pt normally receives these medications from a pain clinic or has a contract with another physician, these medications should be received from that clinic or physician only).    Future labs/tests needed:  discuss at future apt

## 2023-12-19 DIAGNOSIS — S32810D Multiple fractures of pelvis with stable disruption of pelvic ring, subsequent encounter for fracture with routine healing: Secondary | ICD-10-CM | POA: Diagnosis not present

## 2023-12-20 DIAGNOSIS — Z9181 History of falling: Secondary | ICD-10-CM | POA: Diagnosis not present

## 2023-12-20 DIAGNOSIS — R42 Dizziness and giddiness: Secondary | ICD-10-CM | POA: Diagnosis not present

## 2023-12-20 DIAGNOSIS — R41841 Cognitive communication deficit: Secondary | ICD-10-CM | POA: Diagnosis not present

## 2023-12-20 DIAGNOSIS — R2681 Unsteadiness on feet: Secondary | ICD-10-CM | POA: Diagnosis not present

## 2023-12-21 ENCOUNTER — Encounter: Payer: Self-pay | Admitting: Adult Health

## 2023-12-21 DIAGNOSIS — E559 Vitamin D deficiency, unspecified: Secondary | ICD-10-CM | POA: Insufficient documentation

## 2023-12-22 DIAGNOSIS — R2681 Unsteadiness on feet: Secondary | ICD-10-CM | POA: Diagnosis not present

## 2023-12-22 DIAGNOSIS — Z9181 History of falling: Secondary | ICD-10-CM | POA: Diagnosis not present

## 2023-12-22 DIAGNOSIS — R41841 Cognitive communication deficit: Secondary | ICD-10-CM | POA: Diagnosis not present

## 2023-12-22 DIAGNOSIS — R42 Dizziness and giddiness: Secondary | ICD-10-CM | POA: Diagnosis not present

## 2023-12-25 DIAGNOSIS — R42 Dizziness and giddiness: Secondary | ICD-10-CM | POA: Diagnosis not present

## 2023-12-25 DIAGNOSIS — R41841 Cognitive communication deficit: Secondary | ICD-10-CM | POA: Diagnosis not present

## 2023-12-27 DIAGNOSIS — R41841 Cognitive communication deficit: Secondary | ICD-10-CM | POA: Diagnosis not present

## 2023-12-27 DIAGNOSIS — Z9181 History of falling: Secondary | ICD-10-CM | POA: Diagnosis not present

## 2023-12-27 DIAGNOSIS — R42 Dizziness and giddiness: Secondary | ICD-10-CM | POA: Diagnosis not present

## 2023-12-27 DIAGNOSIS — R2681 Unsteadiness on feet: Secondary | ICD-10-CM | POA: Diagnosis not present

## 2023-12-31 NOTE — Progress Notes (Addendum)
 Location:  Medical illustrator of Service:  Clinic (12)  Provider: Darlean Maus, NP  PCP: Charlanne Fredia CROME, MD Patient Care Team: Charlanne Fredia CROME, MD as PCP - General (Internal Medicine) Lonni Slain, MD as PCP - Cardiology (Cardiology) Cary Doffing, MD as Attending Physician (Dermatology) Rosalva Sawyer, MD as Consulting Physician (Obstetrics and Gynecology) Leslee Reusing, MD as Consulting Physician (Ophthalmology) Glean Stephane BROCKS, RN (Inactive) as Oncology Nurse Navigator Tyree Nanetta SAILOR, RN as Oncology Nurse Navigator Curvin Deward MOULD, MD as Consulting Physician (General Surgery) Lanny Callander, MD as Consulting Physician (Hematology) Shannon Agent, MD as Consulting Physician (Radiation Oncology) Hunsucker, Donnice SAUNDERS, MD as Consulting Physician (Pulmonary Disease)  Extended Emergency Contact Information Primary Emergency Contact: Hoen,William Chip  [place] Home Phone: [phone] Work Phone: [phone] Mobile Phone: [phone] Relation: Son Secondary Emergency Contact: Ryan,Liz Address: [address] [place], [place] [address] [place] Phone: [phone] Relation: Daughter  Code Status: DNR Goals of care:  Advanced Directive information    10/25/2023    6:05 PM  Advanced Directives  Does Patient Have a Medical Advance Directive? Yes  Type of Advance Directive Out of facility DNR (pink MOST or yellow form)  Does patient want to make changes to medical advance directive? Yes (ED - send information to MyChart)  Pre-existing out of facility DNR order (yellow form or pink MOST form) Pink MOST/Yellow Form most recent copy in chart - Physician notified to receive inpatient order     Allergies  Allergen Reactions   Arimidex  [Anastrozole ] Other (See Comments)    Caused excessive sweating. Allergy not listed on MAR   Sulfa Antibiotics Other (See Comments)    The patient said she was told  when she was 17 she was allergic to this.    Chief Complaint  Patient presents with   Post rehab follow up    Says that her leg jumps. No other complaints. Discussed flu and covid vaccine( Postponed, plans to get at her pharmacy.    HPI:  89 y.o. female seen for two week f/u after discharge from rehab  Musculoskeletal trauma and pain - Hospitalized June 15-19, 2025 for a fall resulting in a right pelvic fracture with 7 mm displacement - Experienced right foot injury with soft tissue and cortical thickening on October 20, 2023 - Readmitted July 2-11, 2025 for intractable pain; imaging revealed fractures of the right ilium and hemisacrum - Underwent surgical stabilization with percutaneous screws on October 30, 2023 - Currently bearing weight and ambulates independently with a walker - CT on October 26, 2023 showed acute nondisplaced fracture of the distal anterior superior fibular cortex  Denies any pelvic or foot pain at this time, ambulatory with a walker  She is sleepy and fatigued today. BP low 98/40  Cognitive impairment and mood symptoms  - Cognitive impairment observed during recent hospitalizations - Concerns for anxiety and depression - Treated with Wellbutrin  and Remeron  -seems to be improving  Hyponatremia - Developed hyponatremia during June hospitalization, treated with salt tablets - improved to 135 12/04/23 - No longer taking sodium tablets - Lexapro  discontinued due to low sodium levels  Edema - Bilateral leg edema present -using compression hose  Chronic cough - Chronic dry cough hx - Treated with Protonix  - No shortness of breath - No history of lung disease  Hypertension - Blood pressure treated with Norvasc  and atenolol , BP low   Hx  of breast ca   Reports some twitching to her legs that are involuntary that she notices intermittently the past few weeks.    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   Diverticulosis    Esophageal reflux    Family history of breast cancer 11/25/2021   Hiatal hernia    Hx of colonic polyps    Hypertension    Per new patient packet   Mild asthma    Per new patient packet   Osteoarthritis    Osteopenia    bone density 1-09 and 2-13   Pulmonary nodules    scattered-CT Scan July 2010, 12-10, and 04-2010-all stable felt benign     Past Surgical History:  Procedure Laterality Date   BREAST LUMPECTOMY WITH RADIOACTIVE SEED LOCALIZATION Left 12/07/2021   Procedure: LEFT BREAST LUMPECTOMY WITH RADIOACTIVE SEED LOCALIZATION;  Surgeon: Curvin Deward MOULD, MD;  Location: [facility];  Service: General;  Laterality: Left;   CHOLECYSTECTOMY  04/25/1972   Gall Bladder; [facility]   DG PORTABLE CHEST X RAY ([facility] HX)  2023   Shoulder   DIAGNOSTIC MAMMOGRAM  2022   [facility]; Per new patient packet   JOINT REPLACEMENT Left 08/24/2010   JOINT REPLACEMENT Left 04/25/2010   Reverse    ORIF PELVIC FRACTURE WITH PERCUTANEOUS SCREWS Right 10/30/2023   Procedure: CLOSED REDUCTION, PELVIS, WITH PERCUTANEOUS FIXATION;  Surgeon: Kendal Franky SQUIBB, MD;  Location: [facility] OR;  Service: Orthopedics;  Laterality: Right;   orthroscopic  Rotator Cuff Left 04/25/2009   Partial shoulder Left 04/26/2003   ROTATOR CUFF REPAIR     x 2 1998 and 1999   SPINE SURGERY  04/26/1983   disk   TONSILLECTOMY  04/25/1950   Dr. Jerome; Per new patient packet      reports that she has never smoked. She has never used smokeless tobacco. She reports that she does not currently use alcohol. She reports that she does not use drugs. Social History   Socioeconomic History   Marital status: Widowed    Spouse name: Not on file   Number of children: 2   Years of education: Not on file   Highest education level: Not on file   Occupational History   Occupation: Retired  Tobacco Use   Smoking status: Never   Smokeless tobacco: Never  Vaping Use   Vaping status: Never Used  Substance and Sexual Activity   Alcohol use: Not Currently   Drug use: No   Sexual activity: Not Currently    Birth control/protection: Post-menopausal  Other Topics Concern   Not on file  Social History Narrative   Diet: Left blank      Caffeine: Yes      Married, if yes what year: Widow; married in 1957       Do you live in a house, apartment, assisted living, condo, trailer, ect: [facility]       Is it one or more stories: Amedeo       How many persons live in your home? One      Pets: Cat      Highest level or education completed: Left blank      Current/Past profession: Artist  Exercise: Yes                 Type and how often: Walk         Living Will: Yes   DNR: Yes    POA/HPOA: Left blank       Functional Status:   Do you have difficulty bathing or dressing yourself? Left blank    Do you have difficulty preparing food or eating? Left blank    Do you have difficulty managing your medications? Left blank    Do you have difficulty managing your finances? Left blank    Do you have difficulty affording your medications? Left blank    Social Drivers of Health   Financial Resource Strain: Low Risk  (12/21/2021)   Overall Financial Resource Strain (CARDIA)    Difficulty of Paying Living Expenses: Not hard at all  Food Insecurity: No Food Insecurity (10/25/2023)   Hunger Vital Sign    Worried About Running Out of Food in the Last Year: Never true    Ran Out of Food in the Last Year: Never true  Transportation Needs: No Transportation Needs (10/25/2023)   PRAPARE - Administrator, Civil Service (Medical): No    Lack of Transportation (Non-Medical): No  Physical Activity: Not on file  Stress: Not on file  Social Connections: Moderately Isolated (10/25/2023)   Social Connection and Isolation  Panel    Frequency of Communication with Friends and Family: More than three times a week    Frequency of Social Gatherings with Friends and Family: Three times a week    Attends Religious Services: Patient declined    Active Member of Clubs or Organizations: Patient declined    Attends Banker Meetings: More than 4 times per year    Marital Status: Widowed  Intimate Partner Violence: Not At Risk (10/25/2023)   Humiliation, Afraid, Rape, and Kick questionnaire    Fear of Current or Ex-Partner: No    Emotionally Abused: No    Physically Abused: No    Sexually Abused: No   Functional Status Survey:    Allergies  Allergen Reactions   Arimidex  [Anastrozole ] Other (See Comments)    Caused excessive sweating. Allergy not listed on MAR   Sulfa Antibiotics Other (See Comments)    The patient said she was told when she was 17 she was allergic to this.    Pertinent  Health Maintenance Due  Topic Date Due   Influenza Vaccine  03/22/2024 (Originally 11/24/2023)   DEXA SCAN  Completed    Medications: Outpatient Encounter Medications as of 01/01/2024  Medication Sig   acetaminophen  (TYLENOL ) 500 MG tablet Take 500 mg by mouth every 8 (eight) hours as needed for mild pain (pain score 1-3) (or headaches).   aspirin  325 MG tablet Take 325 mg by mouth daily.   atenolol  (TENORMIN ) 25 MG tablet Take 25 mg by mouth at bedtime.   buPROPion  (WELLBUTRIN  XL) 150 MG 24 hr tablet Take 1 tablet (150 mg total) by mouth daily.   Cholecalciferol  (VITAMIN D3) 1000 units CAPS Take 2,000 Units by mouth in the morning.   Cyanocobalamin  (B-12) 1000 MCG TABS Take 1,000 mcg by mouth in the morning.   diazepam  (VALIUM ) 2 MG tablet Take 1 tablet (2 mg total) by mouth every 6 (six) hours as needed for anxiety or muscle spasms (can use along with muscle relaxor).   diclofenac Sodium (VOLTAREN) 1 % GEL Apply 1 Application topically every 6 (six) hours as needed (  for pain).   docusate sodium  (COLACE) 100 MG  capsule Take 1 capsule (100 mg total) by mouth 2 (two) times daily.   folic acid  (FOLVITE ) 800 MCG tablet Take 800 mcg by mouth at bedtime.   hydrALAZINE  (APRESOLINE ) 10 MG tablet Take 1 tablet (10 mg total) by mouth as needed (for blood pressure 150 or above).   lidocaine  4 % Place 1 patch onto the skin in the morning and at bedtime.   loperamide (IMODIUM A-D) 2 MG tablet Take 4 mg by mouth every 6 (six) hours as needed for diarrhea or loose stools.   melatonin 5 MG TABS Take 5 mg by mouth as needed (For insomnia).   methocarbamol  (ROBAXIN ) 500 MG tablet Take 1 tablet (500 mg total) by mouth every 6 (six) hours as needed for muscle spasms.   mirtazapine  (REMERON ) 15 MG tablet Take 7.5 mg by mouth at bedtime.   ondansetron  (ZOFRAN ) 4 MG tablet Take 4 mg by mouth every 6 (six) hours as needed for nausea or vomiting.   pantoprazole  (PROTONIX ) 40 MG tablet Take 1 tablet (40 mg total) by mouth daily.   polyethylene glycol (MIRALAX  / GLYCOLAX ) 17 g packet Take 17 g by mouth daily.   SYSTANE COMPLETE PF 0.6 % SOLN Place 1 drop into both eyes 3 (three) times daily as needed (for dryness).   [DISCONTINUED] amLODipine  (NORVASC ) 5 MG tablet Take 2.5 mg by mouth in the morning.   [DISCONTINUED] COVID-19 mRNA Virus Vaccine (MODERNA COVID-19 VACCINE IM) Inject 1 Dose into the muscle once.   [DISCONTINUED] Alum & Mag Hydroxide-Simeth (SG ANTACID LIQUID/SIMETHICONE PO) Take 30 mLs by mouth every 4 (four) hours as needed. (Patient not taking: Reported on 01/01/2024)   [DISCONTINUED] magnesium hydroxide (MILK OF MAGNESIA) 400 MG/5ML suspension Take 30 mLs by mouth daily as needed for mild constipation. (Patient not taking: Reported on 01/01/2024)   No facility-administered encounter medications on file as of 01/01/2024.    Review of Systems  Constitutional:  Negative for activity change, appetite change, chills, diaphoresis, fatigue and fever.  HENT:  Negative for congestion.   Respiratory:  Negative for cough,  shortness of breath and wheezing.   Cardiovascular:  Positive for leg swelling. Negative for chest pain.  Gastrointestinal:  Negative for abdominal distention, abdominal pain, constipation, diarrhea, nausea and vomiting.  Genitourinary:  Negative for difficulty urinating, dysuria and urgency.  Musculoskeletal:  Positive for gait problem (uses walker). Negative for back pain, myalgias and neck pain.  Skin:  Negative for rash.  Neurological:  Negative for dizziness and weakness.  Psychiatric/Behavioral:  Negative for confusion.        Memory loss     Vitals:   01/01/24 1058 01/01/24 1421  BP: (!) 98/40 (!) 98/48  Pulse: 67   Temp: 97.7 F (36.5 C)   Weight: 108 lb 3.2 oz (49.1 kg)   Height: 5' 2 (1.575 m)     Body mass index is 19.79 kg/m. Physical Exam Vitals reviewed.  Constitutional:      General: She is not in acute distress.    Appearance: She is not diaphoretic.  HENT:     Head: Normocephalic and atraumatic.  Neck:     Vascular: No JVD.  Cardiovascular:     Rate and Rhythm: Normal rate and regular rhythm.     Heart sounds: No murmur heard. Pulmonary:     Effort: Pulmonary effort is normal. No respiratory distress.     Breath sounds: Normal breath sounds. No wheezing.  Musculoskeletal:  Right lower leg: Edema present.     Left lower leg: Edema present.  Skin:    General: Skin is warm and dry.  Neurological:     General: No focal deficit present.     Mental Status: She is alert. Mental status is at baseline.     Labs reviewed: Basic Metabolic Panel: Recent Labs    10/08/23 1646 10/09/23 0509 10/12/23 0522 10/16/23 0000 10/28/23 0406 10/29/23 0526 10/31/23 0703 12/04/23 0000  NA  --    < > 131*   < > 131* 130* 132* 135*  K  --    < > 3.8   < > 4.3 4.6 3.9 4.4  CL  --    < > 98   < > 96* 96* 100 102  CO2  --    < > 23   < > 23 25 23  25*  GLUCOSE  --    < > 94   < > 102* 101* 117*  --   BUN  --    < > 11   < > 10 9 13 15   CREATININE  --    < >  0.42*   < > 0.53 0.55 0.53 0.6  CALCIUM   --    < > 8.4*   < > 8.9 8.6* 8.4* 9.2  MG 1.9  --   --   --   --   --   --   --   PHOS  --   --  3.6  --   --   --   --   --    < > = values in this interval not displayed.   Liver Function Tests: Recent Labs    04/13/23 0000 09/26/23 0000 09/26/23 1020 10/12/23 0522  AST 19 19  --   --   ALT 12 13  --   --   ALKPHOS 55 66  --   --   ALBUMIN 4.2  --  4.2 3.1*   No results for input(s): LIPASE, AMYLASE in the last 8760 hours. No results for input(s): AMMONIA in the last 8760 hours. CBC: Recent Labs    10/26/23 0929 10/27/23 0421 10/29/23 0526 10/31/23 0703 11/01/23 0605 12/04/23 0000  WBC 7.2 7.6 8.7 9.6 8.4 6.3  NEUTROABS 5.6 5.7 6.4  --   --   --   HGB 10.3* 11.6* 11.1* 10.4* 10.5* 11.0*  HCT 30.9* 35.3* 32.2* 31.3* 31.7* 32*  MCV 84.0 82.7 81.7 82.6 82.1  --   PLT 394 446* 437* 395 340 242   Cardiac Enzymes: Recent Labs    10/08/23 1646 10/27/23 1510  CKTOTAL 89 25*   BNP: Invalid input(s): POCBNP CBG: No results for input(s): GLUCAP in the last 8760 hours.  Procedures and Imaging Studies During Stay: No results found.  Assessment/Plan:    Low BP Fatigue D/c norvasc  Check CBC BMP  Right pelvic fractures (pubic ramus, ilium, hemisacrum) status post surgical stabilization Status post surgical stabilization with percutaneous screws on October 30, 2023.  Doing well in AL but feeling fatigued today   Osteopenia with history of pelvic fractures Osteopenia with recent pelvic fractures. Bone density on April 26, 2022, showed left femur T score of -1.9 and right femur T score of 2.3.  Can consider treatment at next visit, focused on low bp and fatigue  Mild cognitive impairment Mild cognitive impairment noted during hospital stay. Best suited for assisted living, and plans are in place for her to move there.  Depression Depression managed with Wellbutrin  and Remeron . Avoiding SSRIs due to prior history of  hyponatremia.  Bilateral lower extremity edema Bilateral lower extremity edema present. - Recommend wearing compression hose.  Chronic cough Chronic dry cough with no shortness of breath or history of lung disease. - Managed with Protonix .  Vitamin D  deficiency Vitamin D  deficiency managed with vitamin D3 supplementation.  Essential hypertension Below goal D/c norvasc  F/u 2 weeks  Hyponatremia Off lexapro   NA 135  Total time *:  time greater than 50% of total time spent doing pt counseling and coordination of care    Assessment & Plan   Abnormal involuntary movements of lower extremities Involuntary leg movements possibly due to medication or reflexes. Previous blood work normal, magnesium not recently checked. - Order magnesium level test.  Labs CBC BMP Mg

## 2024-01-01 ENCOUNTER — Encounter: Payer: Self-pay | Admitting: Adult Health

## 2024-01-01 ENCOUNTER — Non-Acute Institutional Stay: Admitting: Adult Health

## 2024-01-01 VITALS — BP 98/48 | HR 67 | Temp 97.7°F | Ht 62.0 in | Wt 108.2 lb

## 2024-01-01 DIAGNOSIS — Z23 Encounter for immunization: Secondary | ICD-10-CM

## 2024-01-01 DIAGNOSIS — S32810D Multiple fractures of pelvis with stable disruption of pelvic ring, subsequent encounter for fracture with routine healing: Secondary | ICD-10-CM | POA: Diagnosis not present

## 2024-01-01 DIAGNOSIS — R253 Fasciculation: Secondary | ICD-10-CM

## 2024-01-01 DIAGNOSIS — I10 Essential (primary) hypertension: Secondary | ICD-10-CM | POA: Diagnosis not present

## 2024-01-01 DIAGNOSIS — R41841 Cognitive communication deficit: Secondary | ICD-10-CM | POA: Diagnosis not present

## 2024-01-01 DIAGNOSIS — R5383 Other fatigue: Secondary | ICD-10-CM

## 2024-01-01 DIAGNOSIS — F329 Major depressive disorder, single episode, unspecified: Secondary | ICD-10-CM

## 2024-01-01 DIAGNOSIS — M8580 Other specified disorders of bone density and structure, unspecified site: Secondary | ICD-10-CM | POA: Diagnosis not present

## 2024-01-01 DIAGNOSIS — Z961 Presence of intraocular lens: Secondary | ICD-10-CM | POA: Diagnosis not present

## 2024-01-01 DIAGNOSIS — R42 Dizziness and giddiness: Secondary | ICD-10-CM | POA: Diagnosis not present

## 2024-01-01 NOTE — Patient Instructions (Signed)
 1.)Please visit your local pharmacy to receive your flu and covid vaccines.

## 2024-01-02 ENCOUNTER — Encounter: Payer: Self-pay | Admitting: Adult Health

## 2024-01-02 DIAGNOSIS — Z9181 History of falling: Secondary | ICD-10-CM | POA: Diagnosis not present

## 2024-01-02 DIAGNOSIS — I10 Essential (primary) hypertension: Secondary | ICD-10-CM | POA: Diagnosis not present

## 2024-01-02 DIAGNOSIS — E612 Magnesium deficiency: Secondary | ICD-10-CM | POA: Diagnosis not present

## 2024-01-02 DIAGNOSIS — R2681 Unsteadiness on feet: Secondary | ICD-10-CM | POA: Diagnosis not present

## 2024-01-02 DIAGNOSIS — R253 Fasciculation: Secondary | ICD-10-CM | POA: Diagnosis not present

## 2024-01-02 LAB — CBC AND DIFFERENTIAL
HCT: 31 — AB (ref 36–46)
Hemoglobin: 10.8 — AB (ref 12.0–16.0)
Platelets: 256 K/uL (ref 150–400)
WBC: 5.3

## 2024-01-02 LAB — BASIC METABOLIC PANEL WITH GFR
BUN: 13 (ref 4–21)
CO2: 21 (ref 13–22)
Chloride: 97 — AB (ref 99–108)
Creatinine: 0.5 (ref 0.5–1.1)
Glucose: 80
Potassium: 4 meq/L (ref 3.5–5.1)
Sodium: 131 — AB (ref 137–147)

## 2024-01-02 LAB — COMPREHENSIVE METABOLIC PANEL WITH GFR
Albumin: 3.7 (ref 3.5–5.0)
Calcium: 9 (ref 8.7–10.7)
Globulin: 1.8
eGFR: 87

## 2024-01-02 LAB — HEPATIC FUNCTION PANEL
ALT: 9 U/L (ref 7–35)
AST: 15 (ref 13–35)
Alkaline Phosphatase: 58 (ref 25–125)
Bilirubin, Total: 0.5

## 2024-01-02 LAB — CBC: RBC: 3.93 (ref 3.87–5.11)

## 2024-01-03 ENCOUNTER — Encounter: Payer: Self-pay | Admitting: Pharmacist

## 2024-01-03 DIAGNOSIS — R41841 Cognitive communication deficit: Secondary | ICD-10-CM | POA: Diagnosis not present

## 2024-01-03 DIAGNOSIS — R2681 Unsteadiness on feet: Secondary | ICD-10-CM | POA: Diagnosis not present

## 2024-01-03 DIAGNOSIS — Z9181 History of falling: Secondary | ICD-10-CM | POA: Diagnosis not present

## 2024-01-03 DIAGNOSIS — R42 Dizziness and giddiness: Secondary | ICD-10-CM | POA: Diagnosis not present

## 2024-01-03 NOTE — Progress Notes (Signed)
   01/03/2024  Patient ID: Tammy Lindsey, female   DOB: [birth year], 89 y.o.   MRN: [id number] Pharmacy Quality Measure Review  This patient is appearing on a report for being at risk of failing the adherence measure for hypertension (ACEi/ARB) medications this calendar year.   Medication: Olmesartan   Last fill date: 08/11/23 for 90 day supply  Reviewed chart. Olmesartan  was discontinued at discharge in July.  Tammy Lindsey, PharmD, BCACP Clinical Pharmacist [phone]

## 2024-01-05 DIAGNOSIS — Z9181 History of falling: Secondary | ICD-10-CM | POA: Diagnosis not present

## 2024-01-05 DIAGNOSIS — R42 Dizziness and giddiness: Secondary | ICD-10-CM | POA: Diagnosis not present

## 2024-01-05 DIAGNOSIS — R41841 Cognitive communication deficit: Secondary | ICD-10-CM | POA: Diagnosis not present

## 2024-01-05 DIAGNOSIS — R2681 Unsteadiness on feet: Secondary | ICD-10-CM | POA: Diagnosis not present

## 2024-01-08 ENCOUNTER — Non-Acute Institutional Stay: Payer: Self-pay | Admitting: Adult Health

## 2024-01-08 ENCOUNTER — Encounter: Payer: Self-pay | Admitting: Adult Health

## 2024-01-08 DIAGNOSIS — M25551 Pain in right hip: Secondary | ICD-10-CM

## 2024-01-08 DIAGNOSIS — D509 Iron deficiency anemia, unspecified: Secondary | ICD-10-CM | POA: Diagnosis not present

## 2024-01-08 DIAGNOSIS — R42 Dizziness and giddiness: Secondary | ICD-10-CM | POA: Diagnosis not present

## 2024-01-08 DIAGNOSIS — R253 Fasciculation: Secondary | ICD-10-CM | POA: Diagnosis not present

## 2024-01-08 NOTE — Progress Notes (Signed)
 Location:  [facility] Room Number: 522 P Place of Service:  ALF [phone]) Provider:  Tawni America, NP    Patient Care Team: Charlanne Fredia CROME, MD as PCP - General (Internal Medicine) Lonni Slain, MD as PCP - Cardiology (Cardiology) Cary Doffing, MD as Attending Physician (Dermatology) Rosalva Sawyer, MD as Consulting Physician (Obstetrics and Gynecology) Leslee Reusing, MD as Consulting Physician (Ophthalmology) Tyree Nanetta SAILOR, RN as Oncology Nurse Navigator Curvin Deward MOULD, MD as Consulting Physician (General Surgery) Lanny Callander, MD as Consulting Physician (Hematology) Shannon Agent, MD as Consulting Physician (Radiation Oncology) Hunsucker, Donnice SAUNDERS, MD as Consulting Physician (Pulmonary Disease)  Extended Emergency Contact Information Primary Emergency Contact: Yetman,William Chip  [place] Home Phone: [phone] Work Phone: [phone] Mobile Phone: [phone] Relation: Son Secondary Emergency Contact: Ryan,Liz Address: [address] [place], [place] [address] [place] Phone: [phone] Relation: Daughter  Code Status:  DNR Goals of care: Advanced Directive information    10/25/2023    6:05 PM  Advanced Directives  Does Patient Have a Medical Advance Directive? Yes  Type of Advance Directive Out of facility DNR (pink MOST or yellow form)  Does patient want to make changes to medical advance directive? Yes (ED - send information to MyChart)  Pre-existing out of facility DNR order (yellow form or pink MOST form) Pink MOST/Yellow Form most recent copy in chart - Physician notified to receive inpatient order     Chief Complaint  Patient presents with   Acute Visit    Fall    HPI:  Pt is a 89 y.o. female seen today for an acute visit for a fall  Ms. Bohlin resides in [place]. She reports she stumbled and fell. She states that she hit her head on the back and went down on  her bottom. Later states she could not remember the circumstances.  At this time she denies vision changes, nausea, headache etc.  Reports right pelvic pain when standing.  2 view pelvic and lumbar xray this am are negative for acute fracture. Currently she is using a walker but does feel discomfort with weight bearing.  Progressively more forgetful On remeron  for sleep, wellbutrin  was added in July for anxiety/depression Mood has improved but she is now having some twitching in her legs. BMP done NA 131 otherwise normal. Mag 1.8.   Feels tired at times.  Found to have microcytosis with MCV 79.6 Hgb 10.8 iron started on 01/02/24  She had a fall with pelvic fracture with closed reduction 10/30/23 Experienced right foot injury with soft tissue and cortical thickening on October 20, 2023 Has worked with therapy  Also taken off norvasc  due to low bp on 01/01/24 Feels dizzy when standing.  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   Diverticulosis    Esophageal reflux    Family history of breast cancer 11/25/2021   Hiatal hernia    Hx of colonic polyps    Hypertension    Per new patient packet   Mild asthma    Per new patient packet   Osteoarthritis  Osteopenia    bone density 1-09 and 2-13   Pulmonary nodules    scattered-CT Scan July 2010, 12-10, and 04-2010-all stable felt benign    Past Surgical History:  Procedure Laterality Date   BREAST LUMPECTOMY WITH RADIOACTIVE SEED LOCALIZATION Left 12/07/2021   Procedure: LEFT BREAST LUMPECTOMY WITH RADIOACTIVE SEED LOCALIZATION;  Surgeon: Curvin Deward MOULD, MD;  Location: [facility];  Service: General;  Laterality: Left;   CHOLECYSTECTOMY  04/25/1972   Gall  Bladder; [facility]   DG PORTABLE CHEST X RAY ([facility] HX)  2023   Shoulder   DIAGNOSTIC MAMMOGRAM  2022   [facility]; Per new patient packet   JOINT REPLACEMENT Left 08/24/2010   JOINT REPLACEMENT Left 04/25/2010   Reverse    ORIF PELVIC FRACTURE WITH PERCUTANEOUS SCREWS Right 10/30/2023   Procedure: CLOSED REDUCTION, PELVIS, WITH PERCUTANEOUS FIXATION;  Surgeon: Kendal Franky SQUIBB, MD;  Location: [facility] OR;  Service: Orthopedics;  Laterality: Right;   orthroscopic  Rotator Cuff Left 04/25/2009   Partial shoulder Left 04/26/2003   ROTATOR CUFF REPAIR     x 2 1998 and 1999   SPINE SURGERY  04/26/1983   disk   TONSILLECTOMY  04/25/1950   Dr. Jerome; Per new patient packet    Allergies  Allergen Reactions   Arimidex  [Anastrozole ] Other (See Comments)    Caused excessive sweating. Allergy not listed on MAR   Sulfa Antibiotics Other (See Comments)    The patient said she was told when she was 17 she was allergic to this.    Outpatient Encounter Medications as of 01/08/2024  Medication Sig   acetaminophen  (TYLENOL ) 500 MG tablet Take 500 mg by mouth every 8 (eight) hours as needed for mild pain (pain score 1-3) (or headaches).   aspirin  325 MG tablet Take 325 mg by mouth daily.   atenolol  (TENORMIN ) 25 MG tablet Take 25 mg by mouth at bedtime.   buPROPion  (WELLBUTRIN  XL) 150 MG 24 hr tablet Take 1 tablet (150 mg total) by mouth daily.   Cholecalciferol  (VITAMIN D3) 1000 units CAPS Take 2,000 Units by mouth in the morning.   Cyanocobalamin  (B-12) 1000 MCG TABS Take 1,000 mcg by mouth in the morning.   diazepam  (VALIUM ) 2 MG tablet Take 1 tablet (2 mg total) by mouth every 6 (six) hours as needed for anxiety or muscle spasms (can use along with muscle relaxor).   diclofenac Sodium (VOLTAREN) 1 % GEL Apply 1 Application topically every 6 (six) hours as needed (for pain).   docusate sodium  (COLACE) 100 MG capsule Take 1 capsule (100 mg total) by mouth 2 (two) times daily.   ferrous sulfate 325 (65 FE)  MG EC tablet Take 325 mg by mouth daily. Give 325 mg by mouth one time a day for past low hemoglobin   folic acid  (FOLVITE ) 800 MCG tablet Take 800 mcg by mouth at bedtime.   hydrALAZINE  (APRESOLINE ) 10 MG tablet Take 1 tablet (10 mg total) by mouth as needed (for blood pressure 150 or above).   lidocaine  4 % Place 1 patch onto the skin in the morning and at bedtime.   loperamide (IMODIUM A-D) 2 MG tablet Take 4 mg by mouth every 6 (six) hours as needed for diarrhea or loose stools.   melatonin 5 MG TABS Take 5 mg by mouth as needed (For insomnia).   methocarbamol  (ROBAXIN ) 500 MG tablet Take 1 tablet (500 mg total) by mouth every 6 (six) hours as needed for muscle spasms.  mirtazapine  (REMERON ) 15 MG tablet Take 7.5 mg by mouth at bedtime.   ondansetron  (ZOFRAN ) 4 MG tablet Take 4 mg by mouth every 6 (six) hours as needed for nausea or vomiting.   pantoprazole  (PROTONIX ) 40 MG tablet Take 1 tablet (40 mg total) by mouth daily.   polyethylene glycol (MIRALAX  / GLYCOLAX ) 17 g packet Take 17 g by mouth daily.   SYSTANE COMPLETE PF 0.6 % SOLN Place 1 drop into both eyes 3 (three) times daily as needed (for dryness).   No facility-administered encounter medications on file as of 01/08/2024.    Review of Systems  Constitutional:  Positive for activity change. Negative for appetite change, chills, diaphoresis, fatigue and fever.  HENT:  Negative for congestion.   Respiratory:  Positive for cough (chronic). Negative for shortness of breath and wheezing.   Cardiovascular:  Positive for leg swelling. Negative for chest pain.  Gastrointestinal:  Negative for abdominal distention, abdominal pain, constipation, diarrhea, nausea and vomiting.  Genitourinary:  Negative for difficulty urinating, dysuria and urgency.  Musculoskeletal:  Positive for arthralgias and gait problem. Negative for back pain, myalgias and neck pain.       Right hip pain   Skin:  Negative for rash.  Neurological:  Positive for  dizziness (when standing). Negative for weakness.  Psychiatric/Behavioral:  Positive for confusion. Negative for agitation, behavioral problems, dysphoric mood and sleep disturbance. The patient is not nervous/anxious.     Immunization History  Administered Date(s) Administered   Fluad  Trivalent(High Dose 65+) 01/03/2023   Influenza Split 01/10/2011, 12/27/2011, 01/09/2012, 01/26/2015, 12/17/2017, 01/12/2019, 12/19/2019, 12/19/2020   Influenza-Unspecified 12/24/2021   Moderna Covid-19 Vaccine Bivalent Booster 74yrs & up 02/05/2021, 09/27/2021, 01/27/2022   Moderna SARS-COV2 Booster Vaccination 03/10/2020, 11/24/2020, 12/11/2021   Moderna Sars-Covid-2 Vaccination 05/07/2019, 06/04/2019   PNEUMOCOCCAL CONJUGATE-20 04/01/2022   Pfizer(Comirnaty )Fall Seasonal Vaccine 12 years and older 01/03/2023, 07/25/2023   Pneumococcal Polysaccharide-23 08/02/2016, 07/21/2020, 11/10/2020   Respiratory Syncytial Virus Vaccine ,Recomb Aduvanted(Arexvy ) 04/14/2022   Tdap 06/13/2022   Zoster Recombinant(Shingrix) 09/06/2016, 11/22/2016   Pertinent  Health Maintenance Due  Topic Date Due   Influenza Vaccine  03/22/2024 (Originally 11/24/2023)   DEXA SCAN  Completed      11/14/2022    3:28 PM 11/15/2022    2:12 PM 04/04/2023    9:21 AM 06/06/2023    3:57 PM 07/24/2023    1:11 PM  Fall Risk  Falls in the past year? 0 0 1 1 1   Was there an injury with Fall? 0 0 1 1 1   Fall Risk Category Calculator 0 0 2 2 2   Patient at Risk for Falls Due to No Fall Risks No Fall Risks History of fall(s) History of fall(s);Impaired balance/gait No Fall Risks  Fall risk Follow up Falls evaluation completed Falls evaluation completed Falls evaluation completed Falls evaluation completed Falls evaluation completed   Functional Status Survey:    Vitals:   01/08/24 1004  BP: 132/75  Pulse: 70  Resp: 16  Temp: (!) 97 F (36.1 C)  SpO2: 98%  Weight: 104 lb 12.8 oz (47.5 kg)  Height: 5' 2 (1.575 m)   Body mass index is  19.17 kg/m. Physical Exam Vitals reviewed.  Constitutional:      Appearance: Normal appearance.  HENT:     Head: Normocephalic and atraumatic.     Mouth/Throat:     Mouth: Mucous membranes are moist.     Pharynx: Oropharynx is clear.  Eyes:     Extraocular Movements: Extraocular movements intact.  Conjunctiva/sclera: Conjunctivae normal.     Pupils: Pupils are equal, round, and reactive to light.  Cardiovascular:     Rate and Rhythm: Normal rate and regular rhythm.     Heart sounds: No murmur heard. Pulmonary:     Effort: Pulmonary effort is normal. No respiratory distress.     Breath sounds: Normal breath sounds. No wheezing.  Abdominal:     General: Abdomen is flat. Bowel sounds are normal.     Palpations: Abdomen is soft.  Musculoskeletal:        General: No swelling or tenderness.     Right lower leg: Edema present.     Left lower leg: Edema present.     Comments: No pain with ROM of either hip or knee.  Chronic deformity of left shoulder.   Neurological:     General: No focal deficit present.     Mental Status: She is alert. Mental status is at baseline.     Labs reviewed: Recent Labs    10/08/23 1646 10/09/23 0509 10/12/23 0522 10/16/23 0000 10/28/23 0406 10/29/23 0526 10/31/23 0703 12/04/23 0000  NA  --    < > 131*   < > 131* 130* 132* 135*  K  --    < > 3.8   < > 4.3 4.6 3.9 4.4  CL  --    < > 98   < > 96* 96* 100 102  CO2  --    < > 23   < > 23 25 23  25*  GLUCOSE  --    < > 94   < > 102* 101* 117*  --   BUN  --    < > 11   < > 10 9 13 15   CREATININE  --    < > 0.42*   < > 0.53 0.55 0.53 0.6  CALCIUM   --    < > 8.4*   < > 8.9 8.6* 8.4* 9.2  MG 1.9  --   --   --   --   --   --   --   PHOS  --   --  3.6  --   --   --   --   --    < > = values in this interval not displayed.   Recent Labs    04/13/23 0000 09/26/23 0000 09/26/23 1020 10/12/23 0522  AST 19 19  --   --   ALT 12 13  --   --   ALKPHOS 55 66  --   --   ALBUMIN 4.2  --  4.2 3.1*    Recent Labs    10/26/23 0929 10/27/23 0421 10/29/23 0526 10/31/23 0703 11/01/23 0605 12/04/23 0000  WBC 7.2 7.6 8.7 9.6 8.4 6.3  NEUTROABS 5.6 5.7 6.4  --   --   --   HGB 10.3* 11.6* 11.1* 10.4* 10.5* 11.0*  HCT 30.9* 35.3* 32.2* 31.3* 31.7* 32*  MCV 84.0 82.7 81.7 82.6 82.1  --   PLT 394 446* 437* 395 340 242   Lab Results  Component Value Date   TSH 0.947 10/11/2023   Lab Results  Component Value Date   HGBA1C 5.5 06/08/2022   Lab Results  Component Value Date   CHOL 136 04/13/2023   HDL 57 04/13/2023   LDLCALC 63 04/13/2023   TRIG 83 04/13/2023    Significant Diagnostic Results in last 30 days:  No results found.  Assessment/Plan  1. Right hip pain (Primary)  Xray neg Tylenol  for pain D/c diazepam  due to falls If worsening would need CT  2. Dizziness When standing Rise slowly Labs ok Eating and drinking well Orthostatic bp ok Continue to monitor  3. Iron deficiency anemia, unspecified iron deficiency anemia type On iron Repeat labs one month   4. Muscle twitching ?med induced? D/c remeron  due to falls and twitching Keep wellbutrin  for now.     Labs/tests ordered:  CBC and iron level one month   Total time :  time greater than 50% of total time spent doing pt counseling and coordination of care

## 2024-01-09 DIAGNOSIS — R42 Dizziness and giddiness: Secondary | ICD-10-CM | POA: Diagnosis not present

## 2024-01-09 DIAGNOSIS — R41841 Cognitive communication deficit: Secondary | ICD-10-CM | POA: Diagnosis not present

## 2024-01-10 DIAGNOSIS — Z9181 History of falling: Secondary | ICD-10-CM | POA: Diagnosis not present

## 2024-01-10 DIAGNOSIS — R42 Dizziness and giddiness: Secondary | ICD-10-CM | POA: Diagnosis not present

## 2024-01-10 DIAGNOSIS — R41841 Cognitive communication deficit: Secondary | ICD-10-CM | POA: Diagnosis not present

## 2024-01-10 DIAGNOSIS — R2681 Unsteadiness on feet: Secondary | ICD-10-CM | POA: Diagnosis not present

## 2024-01-12 ENCOUNTER — Emergency Department (HOSPITAL_COMMUNITY)

## 2024-01-12 ENCOUNTER — Non-Acute Institutional Stay: Admitting: Adult Health

## 2024-01-12 ENCOUNTER — Encounter (HOSPITAL_COMMUNITY): Payer: Self-pay

## 2024-01-12 ENCOUNTER — Encounter: Payer: Self-pay | Admitting: Adult Health

## 2024-01-12 ENCOUNTER — Emergency Department (HOSPITAL_COMMUNITY)
Admission: EM | Admit: 2024-01-12 | Discharge: 2024-01-12 | Disposition: A | Discharge status: Home / Self Care | Source: Skilled Nursing Facility | Attending: Emergency Medicine | Admitting: Emergency Medicine

## 2024-01-12 DIAGNOSIS — R410 Disorientation, unspecified: Secondary | ICD-10-CM | POA: Diagnosis not present

## 2024-01-12 DIAGNOSIS — M549 Dorsalgia, unspecified: Secondary | ICD-10-CM | POA: Diagnosis not present

## 2024-01-12 DIAGNOSIS — R42 Dizziness and giddiness: Secondary | ICD-10-CM | POA: Diagnosis not present

## 2024-01-12 DIAGNOSIS — R0602 Shortness of breath: Secondary | ICD-10-CM | POA: Diagnosis not present

## 2024-01-12 DIAGNOSIS — R404 Transient alteration of awareness: Secondary | ICD-10-CM | POA: Diagnosis not present

## 2024-01-12 DIAGNOSIS — R531 Weakness: Secondary | ICD-10-CM

## 2024-01-12 DIAGNOSIS — Z96612 Presence of left artificial shoulder joint: Secondary | ICD-10-CM | POA: Diagnosis not present

## 2024-01-12 DIAGNOSIS — S32591D Other specified fracture of right pubis, subsequent encounter for fracture with routine healing: Secondary | ICD-10-CM | POA: Diagnosis not present

## 2024-01-12 DIAGNOSIS — R41841 Cognitive communication deficit: Secondary | ICD-10-CM | POA: Diagnosis not present

## 2024-01-12 DIAGNOSIS — Z7982 Long term (current) use of aspirin: Secondary | ICD-10-CM | POA: Insufficient documentation

## 2024-01-12 DIAGNOSIS — M545 Low back pain, unspecified: Secondary | ICD-10-CM | POA: Diagnosis not present

## 2024-01-12 DIAGNOSIS — I10 Essential (primary) hypertension: Secondary | ICD-10-CM | POA: Diagnosis not present

## 2024-01-12 DIAGNOSIS — R4182 Altered mental status, unspecified: Secondary | ICD-10-CM | POA: Diagnosis present

## 2024-01-12 LAB — CBC WITH DIFFERENTIAL/PLATELET
Abs Immature Granulocytes: 0.03 K/uL (ref 0.00–0.07)
Basophils Absolute: 0 K/uL (ref 0.0–0.1)
Basophils Relative: 1 %
Eosinophils Absolute: 0.1 K/uL (ref 0.0–0.5)
Eosinophils Relative: 2 %
HCT: 29.2 % — ABNORMAL LOW (ref 36.0–46.0)
Hemoglobin: 9.8 g/dL — ABNORMAL LOW (ref 12.0–15.0)
Immature Granulocytes: 1 %
Lymphocytes Relative: 9 %
Lymphs Abs: 0.5 K/uL — ABNORMAL LOW (ref 0.7–4.0)
MCH: 26.9 pg (ref 26.0–34.0)
MCHC: 33.6 g/dL (ref 30.0–36.0)
MCV: 80.2 fL (ref 80.0–100.0)
Monocytes Absolute: 0.6 K/uL (ref 0.1–1.0)
Monocytes Relative: 10 %
Neutro Abs: 4.5 K/uL (ref 1.7–7.7)
Neutrophils Relative %: 77 %
Platelets: 238 K/uL (ref 150–400)
RBC: 3.64 MIL/uL — ABNORMAL LOW (ref 3.87–5.11)
RDW: 14.6 % (ref 11.5–15.5)
WBC: 5.8 K/uL (ref 4.0–10.5)
nRBC: 0 % (ref 0.0–0.2)

## 2024-01-12 LAB — COMPREHENSIVE METABOLIC PANEL WITH GFR
ALT: 5 U/L (ref 0–44)
AST: 18 U/L (ref 15–41)
Albumin: 4.1 g/dL (ref 3.5–5.0)
Alkaline Phosphatase: 60 U/L (ref 38–126)
Anion gap: 13 (ref 5–15)
BUN: 15 mg/dL (ref 8–23)
CO2: 24 mmol/L (ref 22–32)
Calcium: 9.1 mg/dL (ref 8.9–10.3)
Chloride: 96 mmol/L — ABNORMAL LOW (ref 98–111)
Creatinine, Ser: 0.61 mg/dL (ref 0.44–1.00)
GFR, Estimated: >60 mL/min (ref >60)
Glucose, Bld: 114 mg/dL — ABNORMAL HIGH (ref 70–99)
Potassium: 4.1 mmol/L (ref 3.5–5.1)
Sodium: 132 mmol/L — ABNORMAL LOW (ref 135–145)
Total Bilirubin: 0.4 mg/dL (ref 0.0–1.2)
Total Protein: 6.4 g/dL — ABNORMAL LOW (ref 6.5–8.1)

## 2024-01-12 LAB — URINALYSIS, ROUTINE W REFLEX MICROSCOPIC
Bilirubin Urine: NEGATIVE
Glucose, UA: NEGATIVE mg/dL
Hgb urine dipstick: NEGATIVE
Ketones, ur: 5 mg/dL — AB
Leukocytes,Ua: NEGATIVE
Nitrite: NEGATIVE
Protein, ur: NEGATIVE mg/dL
Specific Gravity, Urine: 1.026 (ref 1.005–1.030)
pH: 5 (ref 5.0–8.0)

## 2024-01-12 LAB — RESP PANEL BY RT-PCR (RSV, FLU A&B, COVID)  RVPGX2
Influenza A by PCR: NEGATIVE
Influenza B by PCR: NEGATIVE
Resp Syncytial Virus by PCR: NEGATIVE
SARS Coronavirus 2 by RT PCR: NEGATIVE

## 2024-01-12 LAB — CBG MONITORING, ED: Glucose-Capillary: 119 mg/dL — ABNORMAL HIGH (ref 70–99)

## 2024-01-12 LAB — TROPONIN T, HIGH SENSITIVITY: Troponin T High Sensitivity: 19 ng/L (ref 0–19)

## 2024-01-12 MED ORDER — SODIUM CHLORIDE 0.9 % IV BOLUS
1000.0000 mL | Freq: Once | INTRAVENOUS | Status: AC
  Administered 2024-01-12: 1000 mL via INTRAVENOUS

## 2024-01-12 NOTE — ED Triage Notes (Signed)
 Pt BIBA for increasing confusion x 1 week.  Per EMS pt fell approximately 1 week prior, no medical treatment necessary.  Pt is A&0 x 2 with complains of right thigh pain

## 2024-01-12 NOTE — Progress Notes (Signed)
 Location:  [facility] Room Number: 522 P Place of Service:  ALF [phone]) Provider:  Tawni America, NP    Patient Care Team: Charlanne Fredia CROME, MD as PCP - General (Internal Medicine) Lonni Slain, MD as PCP - Cardiology (Cardiology) Cary Doffing, MD as Attending Physician (Dermatology) Rosalva Sawyer, MD as Consulting Physician (Obstetrics and Gynecology) Leslee Reusing, MD as Consulting Physician (Ophthalmology) Tyree Nanetta SAILOR, RN as Oncology Nurse Navigator Curvin Deward MOULD, MD as Consulting Physician (General Surgery) Lanny Callander, MD as Consulting Physician (Hematology) Shannon Agent, MD as Consulting Physician (Radiation Oncology) Hunsucker, Donnice SAUNDERS, MD as Consulting Physician (Pulmonary Disease)  Extended Emergency Contact Information Primary Emergency Contact: Marques,William Chip  [place] Home Phone: [phone] Work Phone: [phone] Mobile Phone: [phone] Relation: Son Secondary Emergency Contact: Ryan,Liz Address: [address] [place], [place] [address] [place] Phone: [phone] Relation: Daughter  Code Status:  DNR Goals of care: Advanced Directive information    10/25/2023    6:05 PM  Advanced Directives  Does Patient Have a Medical Advance Directive? Yes  Type of Advance Directive Out of facility DNR (pink MOST or yellow form)  Does patient want to make changes to medical advance directive? Yes (ED - send information to MyChart)  Pre-existing out of facility DNR order (yellow form or pink MOST form) Pink MOST/Yellow Form most recent copy in chart - Physician notified to receive inpatient order     Chief Complaint  Patient presents with   Acute Visit    Confusion     HPI:  Pt is a 89 y.o. female seen today for an acute visit for confusion and increased care needs.   She resides in [place] and needs more help with ADLs, more reminders, and feels anxious and  doesn't want to be alone. She is also having some incontinence. She is assessing at a skilled level of care.  She had an unwitnessed mechanical fall on 01/07/24 and since then has had back pain in the lumbar area. She also had some right hip pain but this resolved. She is using tylenol , voltaren, and  Robaxin  with some relief. She had a lumbar xray and a pelvic xray which were negative for fracture. Remains ambulatory with a walker but needs more assistance. Has underlying chronic memory loss, progressing recently. Does report some frequency, new incontinence as well.  Labs were checked on 9/9 due to weakness and lethargy which showed significant findings she did have a microcytosis with 10.8 Hgb and was placed on iron and also her NA was 131.  Back ground: has had several hospitalizations due to falls year. 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   Diverticulosis    Esophageal reflux    Family history of breast cancer 11/25/2021   Hiatal hernia    Hx of colonic polyps    Hypertension    Per new patient packet   Mild asthma    Per new patient packet   Osteoarthritis    Osteopenia    bone density 1-09 and 2-13   Pulmonary nodules    scattered-CT Scan July 2010, 12-10, and 04-2010-all stable felt benign    Past Surgical History:  Procedure Laterality  Date   BREAST LUMPECTOMY WITH RADIOACTIVE SEED LOCALIZATION Left 12/07/2021   Procedure: LEFT BREAST LUMPECTOMY WITH RADIOACTIVE SEED LOCALIZATION;  Surgeon: Curvin Deward MOULD, MD;  Location: [facility];  Service: General;  Laterality: Left;   CHOLECYSTECTOMY  04/25/1972   Gall Bladder; [facility]   DG PORTABLE CHEST X RAY ([facility] HX)  2023   Shoulder   DIAGNOSTIC MAMMOGRAM  2022   [facility]; Per new patient  packet   JOINT REPLACEMENT Left 08/24/2010   JOINT REPLACEMENT Left 04/25/2010   Reverse    ORIF PELVIC FRACTURE WITH PERCUTANEOUS SCREWS Right 10/30/2023   Procedure: CLOSED REDUCTION, PELVIS, WITH PERCUTANEOUS FIXATION;  Surgeon: Kendal Franky SQUIBB, MD;  Location: [facility] OR;  Service: Orthopedics;  Laterality: Right;   orthroscopic  Rotator Cuff Left 04/25/2009   Partial shoulder Left 04/26/2003   ROTATOR CUFF REPAIR     x 2 1998 and 1999   SPINE SURGERY  04/26/1983   disk   TONSILLECTOMY  04/25/1950   Dr. Jerome; Per new patient packet    Allergies  Allergen Reactions   Arimidex  Gracchus.Going ] Other (See Comments)    Caused excessive sweating. Allergy not listed on MAR   Sulfa Antibiotics Other (See Comments)    The patient said she was told when she was 17 she was allergic to this.    Outpatient Encounter Medications as of 01/12/2024  Medication Sig   acetaminophen  (TYLENOL ) 500 MG tablet Take 500 mg by mouth every 8 (eight) hours as needed for mild pain (pain score 1-3) (or headaches).   aspirin  325 MG tablet Take 325 mg by mouth daily.   atenolol  (TENORMIN ) 25 MG tablet Take 25 mg by mouth at bedtime.   buPROPion  (WELLBUTRIN  XL) 150 MG 24 hr tablet Take 1 tablet (150 mg total) by mouth daily.   Cholecalciferol  (VITAMIN D3) 1000 units CAPS Take 2,000 Units by mouth in the morning.   Cyanocobalamin  (B-12) 1000 MCG TABS Take 1,000 mcg by mouth in the morning.   diclofenac Sodium (VOLTAREN) 1 % GEL Apply 1 Application topically every 6 (six) hours as needed (for pain).   docusate sodium  (COLACE) 100 MG capsule Take 1 capsule (100 mg total) by mouth 2 (two) times daily.   ferrous sulfate 325 (65 FE) MG EC tablet Take 325 mg by mouth daily. Give 325 mg by mouth one time a day for past low hemoglobin   folic acid  (FOLVITE ) 800 MCG tablet Take 800 mcg by mouth at bedtime.   hydrALAZINE  (APRESOLINE ) 10 MG tablet Take 1 tablet (10 mg total) by mouth as needed (for blood pressure 150 or above).    lidocaine  4 % Place 1 patch onto the skin in the morning and at bedtime.   loperamide (IMODIUM A-D) 2 MG tablet Take 4 mg by mouth every 6 (six) hours as needed for diarrhea or loose stools.   melatonin 5 MG TABS Take 5 mg by mouth as needed (For insomnia).   methocarbamol  (ROBAXIN ) 500 MG tablet Take 1 tablet (500 mg total) by mouth every 6 (six) hours as needed for muscle spasms.   ondansetron  (ZOFRAN ) 4 MG tablet Take 4 mg by mouth every 6 (six) hours as needed for nausea or vomiting.   pantoprazole  (PROTONIX ) 40 MG tablet Take 1 tablet (40 mg total) by mouth daily.   polyethylene glycol (MIRALAX  / GLYCOLAX ) 17 g packet Take 17 g by mouth daily.   SYSTANE COMPLETE PF 0.6 % SOLN Place 1 drop into both eyes 3 (  three) times daily as needed (for dryness).   No facility-administered encounter medications on file as of 01/12/2024.    Review of Systems  Constitutional:  Positive for activity change. Negative for appetite change, chills, diaphoresis, fatigue, fever and unexpected weight change.  HENT:  Negative for congestion.   Respiratory:  Negative for cough, shortness of breath and wheezing.   Cardiovascular:  Positive for leg swelling. Negative for chest pain and palpitations.  Gastrointestinal:  Negative for abdominal distention, abdominal pain, constipation and diarrhea.  Genitourinary:  Positive for frequency. Negative for difficulty urinating, dysuria, flank pain, hematuria, pelvic pain and urgency.       Incontinence  Musculoskeletal:  Positive for arthralgias, back pain and gait problem. Negative for joint swelling and myalgias.  Neurological:  Negative for dizziness, tremors, seizures, syncope, facial asymmetry, speech difficulty, weakness, light-headedness, numbness and headaches.  Psychiatric/Behavioral:  Positive for confusion. Negative for agitation and behavioral problems. The patient is nervous/anxious.     Immunization History  Administered Date(s) Administered   Fluad   Trivalent(High Dose 65+) 01/03/2023   Influenza Split 01/10/2011, 12/27/2011, 01/09/2012, 01/26/2015, 12/17/2017, 01/12/2019, 12/19/2019, 12/19/2020   Influenza-Unspecified 12/24/2021   Moderna Covid-19 Vaccine Bivalent Booster 29yrs & up 02/05/2021, 09/27/2021, 01/27/2022   Moderna SARS-COV2 Booster Vaccination 03/10/2020, 11/24/2020, 12/11/2021   Moderna Sars-Covid-2 Vaccination 05/07/2019, 06/04/2019   PNEUMOCOCCAL CONJUGATE-20 04/01/2022   Pfizer(Comirnaty )Fall Seasonal Vaccine 12 years and older 01/03/2023, 07/25/2023   Pneumococcal Polysaccharide-23 08/02/2016, 07/21/2020, 11/10/2020   Respiratory Syncytial Virus Vaccine ,Recomb Aduvanted(Arexvy ) 04/14/2022   Tdap 06/13/2022   Zoster Recombinant(Shingrix) 09/06/2016, 11/22/2016   Pertinent  Health Maintenance Due  Topic Date Due   Influenza Vaccine  03/22/2024 (Originally 11/24/2023)   DEXA SCAN  Completed      11/14/2022    3:28 PM 11/15/2022    2:12 PM 04/04/2023    9:21 AM 06/06/2023    3:57 PM 07/24/2023    1:11 PM  Fall Risk  Falls in the past year? 0 0 1 1 1   Was there an injury with Fall? 0 0 1 1 1   Fall Risk Category Calculator 0 0 2 2 2   Patient at Risk for Falls Due to No Fall Risks No Fall Risks History of fall(s) History of fall(s);Impaired balance/gait No Fall Risks  Fall risk Follow up Falls evaluation completed Falls evaluation completed Falls evaluation completed Falls evaluation completed Falls evaluation completed   Functional Status Survey:    Vitals:   01/12/24 0854 01/12/24 1120  BP: (!) 152/69 (!) 166/66  Pulse: 76   Resp: 17   Temp: 98.6 F (37 C)   SpO2: 93%   Weight: 104 lb 12.8 oz (47.5 kg)   Height: 5' 2 (1.575 m)    Body mass index is 19.17 kg/m. Physical Exam Vitals reviewed.  Constitutional:      General: She is not in acute distress.    Appearance: She is not diaphoretic.  HENT:     Head: Normocephalic and atraumatic.  Neck:     Vascular: No JVD.  Cardiovascular:     Rate and  Rhythm: Normal rate and regular rhythm.     Heart sounds: No murmur heard. Pulmonary:     Effort: Pulmonary effort is normal. No respiratory distress.     Breath sounds: Normal breath sounds. No wheezing.  Abdominal:     General: Abdomen is flat. Bowel sounds are normal.     Palpations: Abdomen is soft.  Musculoskeletal:     Right lower leg: Edema (+1) present.  Left lower leg: Edema (trace) present.     Comments: No pain with ROM of the right hip or left hip  No pain with ROM of the right knee or left knee Does report pain to the lumbar area when raising the right leg No spine tenderness.   Skin:    General: Skin is warm and dry.  Neurological:     General: No focal deficit present.     Mental Status: She is alert.     Comments: Alert to self and place     Labs reviewed: Recent Labs    10/08/23 1646 10/09/23 0509 10/12/23 0522 10/16/23 0000 10/28/23 0406 10/29/23 0526 10/31/23 0703 12/04/23 0000 01/02/24 0000  NA  --    < > 131*   < > 131* 130* 132* 135* 131*  K  --    < > 3.8   < > 4.3 4.6 3.9 4.4 4.0  CL  --    < > 98   < > 96* 96* 100 102 97*  CO2  --    < > 23   < > 23 25 23  25* 21  GLUCOSE  --    < > 94   < > 102* 101* 117*  --   --   BUN  --    < > 11   < > 10 9 13 15 13   CREATININE  --    < > 0.42*   < > 0.53 0.55 0.53 0.6 0.5  CALCIUM   --    < > 8.4*   < > 8.9 8.6* 8.4* 9.2 9.0  MG 1.9  --   --   --   --   --   --   --   --   PHOS  --   --  3.6  --   --   --   --   --   --    < > = values in this interval not displayed.   Recent Labs    04/13/23 0000 09/26/23 0000 09/26/23 1020 10/12/23 0522 01/02/24 0000  AST 19 19  --   --  15  ALT 12 13  --   --  9  ALKPHOS 55 66  --   --  58  ALBUMIN 4.2  --  4.2 3.1* 3.7   Recent Labs    10/26/23 0929 10/27/23 0421 10/29/23 0526 10/31/23 0703 11/01/23 0605 12/04/23 0000 01/02/24 0000  WBC 7.2 7.6 8.7 9.6 8.4 6.3 5.3  NEUTROABS 5.6 5.7 6.4  --   --   --   --   HGB 10.3* 11.6* 11.1* 10.4* 10.5*  11.0* 10.8*  HCT 30.9* 35.3* 32.2* 31.3* 31.7* 32* 31*  MCV 84.0 82.7 81.7 82.6 82.1  --   --   PLT 394 446* 437* 395 340 242 256   Lab Results  Component Value Date   TSH 0.947 10/11/2023   Lab Results  Component Value Date   HGBA1C 5.5 06/08/2022   Lab Results  Component Value Date   CHOL 136 04/13/2023   HDL 57 04/13/2023   LDLCALC 63 04/13/2023   TRIG 83 04/13/2023    Significant Diagnostic Results in last 30 days:  No results found.  Assessment/Plan  1. Lumbar back pain (Primary) After a mechanical fall with neg lumbar xray Recent hx of pelvic fracture with stabilization  Does have a hx of chronic back pain but seems much worse with reduced functional status Recommend ER eval.  2. Confusion ? Underlying infection Needs UA and additional work up  3. Weakness Progressively more weak since her fall Will need work up and likely skilled care bed and physical therapy.    Total time :  time greater than 50% of total time spent doing pt counseling and coordination of care   Left message for her son Chip

## 2024-01-12 NOTE — Discharge Instructions (Addendum)
 Return for any problem.  ?

## 2024-01-12 NOTE — ED Provider Notes (Signed)
 [facility] Provider Note   CSN: [id number] Arrival date & time: 01/12/24  1401     Patient presents with: Altered Mental Status   Tammy Lindsey is a 89 y.o. female.   89 year old female with prior medical history as detailed below presents for evaluation.  Per staff at her assisted living facility, she is more concerned that she is more confused than baseline.  Patient is a pleasant cooperative during evaluation.  She denies any specific complaint.  The history is provided by the patient and medical records.       Prior to Admission medications   Medication Sig Start Date End Date Taking? Authorizing Provider  acetaminophen  (TYLENOL ) 500 MG tablet Take 500 mg by mouth every 8 (eight) hours as needed for mild pain (pain score 1-3) (or headaches).    [provider]  aspirin  325 MG tablet Take 325 mg by mouth daily.    [provider]  atenolol  (TENORMIN ) 25 MG tablet Take 25 mg by mouth at bedtime.    [provider]  buPROPion  (WELLBUTRIN  XL) 150 MG 24 hr tablet Take 1 tablet (150 mg total) by mouth daily. 11/06/23   Charlanne Fredia CROME, MD  Cholecalciferol  (VITAMIN D3) 1000 units CAPS Take 2,000 Units by mouth in the morning. 10/11/23   Rai, Nydia POUR, MD  Cyanocobalamin  (B-12) 1000 MCG TABS Take 1,000 mcg by mouth in the morning.    [provider]  diclofenac Sodium (VOLTAREN) 1 % GEL Apply 1 Application topically every 6 (six) hours as needed (for pain).    [provider]  docusate sodium  (COLACE) 100 MG capsule Take 1 capsule (100 mg total) by mouth 2 (two) times daily. 10/11/23   Rai, Nydia POUR, MD  ferrous sulfate 325 (65 FE) MG EC tablet Take 325 mg by mouth daily. Give 325 mg by mouth one time a day for past low hemoglobin    [provider]  folic acid  (FOLVITE ) 800 MCG tablet Take 800 mcg by mouth at bedtime.    [provider]  hydrALAZINE  (APRESOLINE ) 10 MG tablet  Take 1 tablet (10 mg total) by mouth as needed (for blood pressure 150 or above). 08/11/23   Charlanne Fredia CROME, MD  lidocaine  4 % Place 1 patch onto the skin in the morning and at bedtime.    [provider]  loperamide (IMODIUM A-D) 2 MG tablet Take 4 mg by mouth every 6 (six) hours as needed for diarrhea or loose stools.    [provider]  melatonin 5 MG TABS Take 5 mg by mouth as needed (For insomnia).    [provider]  methocarbamol  (ROBAXIN ) 500 MG tablet Take 1 tablet (500 mg total) by mouth every 6 (six) hours as needed for muscle spasms. 11/01/23   Danton Lauraine LABOR, PA-C  ondansetron  (ZOFRAN ) 4 MG tablet Take 4 mg by mouth every 6 (six) hours as needed for nausea or vomiting.    [provider]  pantoprazole  (PROTONIX ) 40 MG tablet Take 1 tablet (40 mg total) by mouth daily. 07/04/23   Gupta, Anjali L, MD  polyethylene glycol (MIRALAX  / GLYCOLAX ) 17 g packet Take 17 g by mouth daily. 10/12/23   Rai, Nydia POUR, MD  SYSTANE COMPLETE PF 0.6 % SOLN Place 1 drop into both eyes 3 (three) times daily as needed (for dryness).    [provider]  traMADol HCl 25 MG TABS Take 25 mg by mouth every  6 (six) hours as needed. Give 1 tablet by mouth every 6 hours as needed for 25mg  PO Q6hours PRN/Pain X14 days related to PAIN IN UNSPECIFIED JOINT (M25.50) for 14 Days    [provider]    Allergies: Arimidex  [anastrozole ] and Sulfa antibiotics    Review of Systems  All other systems reviewed and are negative.   Updated Vital Signs BP (!) 116/54   Pulse 75   Temp 97.6 F (36.4 C) (Axillary)   Resp 14   SpO2 99%   Physical Exam Vitals and nursing note reviewed.  Constitutional:      General: She is not in acute distress.    Appearance: Normal appearance. She is well-developed.  HENT:     Head: Normocephalic and atraumatic.  Eyes:     Conjunctiva/sclera: Conjunctivae normal.     Pupils: Pupils are equal, round, and reactive to light.   Cardiovascular:     Rate and Rhythm: Normal rate and regular rhythm.     Heart sounds: Normal heart sounds.  Pulmonary:     Effort: Pulmonary effort is normal. No respiratory distress.     Breath sounds: Normal breath sounds.  Abdominal:     General: There is no distension.     Palpations: Abdomen is soft.     Tenderness: There is no abdominal tenderness.  Musculoskeletal:        General: No deformity. Normal range of motion.     Cervical back: Normal range of motion and neck supple.  Skin:    General: Skin is warm and dry.  Neurological:     General: No focal deficit present.     Mental Status: She is alert and oriented to person, place, and time.     (all labs ordered are listed, but only abnormal results are displayed) Labs Reviewed  COMPREHENSIVE METABOLIC PANEL WITH GFR - Abnormal; Notable for the following components:      Result Value   Sodium 132 (*)    Chloride 96 (*)    Glucose, Bld 114 (*)    Total Protein 6.4 (*)    All other components within normal limits  URINALYSIS, ROUTINE W REFLEX MICROSCOPIC - Abnormal; Notable for the following components:   Ketones, ur 5 (*)    All other components within normal limits  CBC WITH DIFFERENTIAL/PLATELET - Abnormal; Notable for the following components:   RBC 3.64 (*)    Hemoglobin 9.8 (*)    HCT 29.2 (*)    Lymphs Abs 0.5 (*)    All other components within normal limits  CBG MONITORING, ED - Abnormal; Notable for the following components:   Glucose-Capillary 119 (*)    All other components within normal limits  RESP PANEL BY RT-PCR (RSV, FLU A&B, COVID)  RVPGX2  CBC WITH DIFFERENTIAL/PLATELET  TROPONIN T, HIGH SENSITIVITY  TROPONIN T, HIGH SENSITIVITY    EKG: None  Radiology: DG Hip Unilat W or Wo Pelvis 2-3 Views Right Result Date: 01/12/2024 CLINICAL DATA:  Pain EXAM: DG HIP (WITH OR WITHOUT PELVIS) 2-3V RIGHT COMPARISON:  Pelvis x-ray 10/30/2023 FINDINGS: Horizontal screw traversing the sacroiliac joints and  right superior pubic ramus appear unchanged in position. There are healing right superior and inferior pubic rami fractures. Alignment is anatomic. No acute fractures are identified. No evidence for hardware loosening or shifting. Joint spaces are maintained. IMPRESSION: 1. Healing right superior and inferior pubic rami fractures. 2. Hardware is unchanged in position. Electronically Signed   By: Greig Maple HERO.D.  On: 01/12/2024 16:05   DG Chest Port 1 View Result Date: 01/12/2024 CLINICAL DATA:  Increasing confusion for 1 week, fell 1 week ago, short of breath EXAM: PORTABLE CHEST 1 VIEW COMPARISON:  05/05/2020 FINDINGS: Single frontal view of the chest demonstrates a stable cardiac silhouette. No acute airspace disease, effusion, or pneumothorax. Stable left shoulder arthroplasty. No acute bony abnormality. IMPRESSION: 1. No acute intrathoracic process. Electronically Signed   By: Ozell Daring M.D.   On: 01/12/2024 15:59   CT Head Wo Contrast Result Date: 01/12/2024 CLINICAL DATA:  Altered level of consciousness, increasing confusion for 1 week, prior fall EXAM: CT HEAD WITHOUT CONTRAST TECHNIQUE: Contiguous axial images were obtained from the base of the skull through the vertex without intravenous contrast. RADIATION DOSE REDUCTION: This exam was performed according to the departmental dose-optimization program which includes automated exposure control, adjustment of the mA and/or kV according to patient size and/or use of iterative reconstruction technique. COMPARISON:  06/16/2023 FINDINGS: Brain: No acute infarct or hemorrhage. Lateral ventricles and midline structures are unremarkable. No acute extra-axial fluid collections. No mass effect. Vascular: No hyperdense vessel or unexpected calcification. Skull: Normal. Negative for fracture or focal lesion. Sinuses/Orbits: No acute finding. Other: None. IMPRESSION: 1. Stable head CT, no acute intracranial process. Electronically Signed   By: Ozell Daring M.D.   On: 01/12/2024 15:39     Procedures   Medications Ordered in the ED  sodium chloride  0.9 % bolus 1,000 mL (1,000 mLs Intravenous New Bag/Given 01/12/24 1749)                                    Medical Decision Making Patient sent from her assisted living facility for evaluation of possible increased confusion.  Patient is without acute complaint.  Workup obtained is without significant acute abnormality identified.  Case discussed with patient's son.  He is in agreement the patient should be returned to her facility for further evaluation and observation.  Importance of close follow-up stressed.  Strict return precautions given and understood.  Amount and/or Complexity of Data Reviewed Labs: ordered. Radiology: ordered.        Final diagnoses:  Confusion    ED Discharge Orders     None          Laurice Maude BROCKS, MD 01/12/24 [phone]

## 2024-01-12 NOTE — ED Notes (Signed)
 Patient has been clean , changed and repositioned in the bed.

## 2024-01-15 ENCOUNTER — Non-Acute Institutional Stay (SKILLED_NURSING_FACILITY): Payer: Self-pay | Admitting: Internal Medicine

## 2024-01-15 ENCOUNTER — Encounter: Payer: Self-pay | Admitting: Internal Medicine

## 2024-01-15 ENCOUNTER — Encounter: Admitting: Adult Health

## 2024-01-15 DIAGNOSIS — R41841 Cognitive communication deficit: Secondary | ICD-10-CM | POA: Diagnosis not present

## 2024-01-15 DIAGNOSIS — I10 Essential (primary) hypertension: Secondary | ICD-10-CM | POA: Diagnosis not present

## 2024-01-15 DIAGNOSIS — R350 Frequency of micturition: Secondary | ICD-10-CM | POA: Diagnosis not present

## 2024-01-15 DIAGNOSIS — D509 Iron deficiency anemia, unspecified: Secondary | ICD-10-CM

## 2024-01-15 DIAGNOSIS — R42 Dizziness and giddiness: Secondary | ICD-10-CM | POA: Diagnosis not present

## 2024-01-15 DIAGNOSIS — R410 Disorientation, unspecified: Secondary | ICD-10-CM

## 2024-01-15 DIAGNOSIS — R531 Weakness: Secondary | ICD-10-CM

## 2024-01-15 DIAGNOSIS — M545 Low back pain, unspecified: Secondary | ICD-10-CM | POA: Diagnosis not present

## 2024-01-15 NOTE — Progress Notes (Unsigned)
 Provider:  Charlanne Fredia CROME, MD  Location:  [facility] Room Number: 154 P Place of Service:  SNF ([phone])  PCP: Charlanne Fredia CROME, MD Patient Care Team: Charlanne Fredia CROME, MD as PCP - General (Internal Medicine) Lonni Slain, MD as PCP - Cardiology (Cardiology) Cary Doffing, MD as Attending Physician (Dermatology) Rosalva Sawyer, MD as Consulting Physician (Obstetrics and Gynecology) Leslee Reusing, MD as Consulting Physician (Ophthalmology) Tyree Nanetta SAILOR, RN as Oncology Nurse Navigator Curvin Deward MOULD, MD as Consulting Physician (General Surgery) Lanny Callander, MD as Consulting Physician (Hematology) Shannon Agent, MD as Consulting Physician (Radiation Oncology) Hunsucker, Donnice SAUNDERS, MD as Consulting Physician (Pulmonary Disease)  Extended Emergency Contact Information Primary Emergency Contact: Gilmer,William Chip  [place] Home Phone: [phone] Work Phone: [phone] Mobile Phone: [phone] Relation: Son Secondary Emergency Contact: Ryan,Liz Address: [address] [place], [place] [address] [place] Phone: [phone] Relation: Daughter  Code Status: DNR Goals of Care: Advanced Directive information    10/25/2023    6:05 PM  Advanced Directives  Does Patient Have a Medical Advance Directive? Yes  Type of Advance Directive Out of facility DNR (pink MOST or yellow form)  Does patient want to make changes to medical advance directive? Yes (ED - send information to MyChart)  Pre-existing out of facility DNR order (yellow form or pink MOST form) Pink MOST/Yellow Form most recent copy in chart - Physician notified to receive inpatient order      Chief Complaint  Patient presents with   ReAdmit To SNF    HPI: Patient is a 89 y.o. female seen today for Readmission to Rehab from AL  Patient was initially admitted to Hospital from 6/15-6/19 for Right Pubic Ramus fracture after a  fall and Hyponatremia  She underwent Stabilization and percutaneous Screws on 07/07 for Uncontrolled pain  Did well with therapy was admitted in AL on 12/18/23  She had fall on 9/15 She says she fell as she lost her Balance Her Xrays were negative but was send to ED on 09/19 for Confusion Her CT head and Hip xrays were all with no acute changes Sodium was 132 HGB was 9.8  Send back to [facility] and now admitted in Rehab Urine was negative   Patient is very Anxious tearful  Very worried about Falling.  Her only complaint was pain in her lower  back. She denied any dizziness. Patient was also taken off the Remeron  due to some twitching and she did not complain of any muscle spasms today But able to stand up but was weak.  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   Diverticulosis    Esophageal reflux    Family history of breast cancer 11/25/2021   Hiatal hernia    Hx of colonic polyps    Hypertension    Per new patient packet   Mild asthma    Per new patient packet   Osteoarthritis    Osteopenia    bone density 1-09 and 2-13   Pulmonary nodules    scattered-CT Scan July 2010, 12-10, and 04-2010-all stable felt benign    Past Surgical History:  Procedure Laterality Date   BREAST LUMPECTOMY WITH RADIOACTIVE SEED LOCALIZATION Left 12/07/2021   Procedure: LEFT BREAST  LUMPECTOMY WITH RADIOACTIVE SEED LOCALIZATION;  Surgeon: Curvin Deward MOULD, MD;  Location: [facility];  Service: General;  Laterality: Left;   CHOLECYSTECTOMY  04/25/1972   Gall Bladder; [facility]   DG PORTABLE CHEST X RAY ([facility] HX)  2023   Shoulder   DIAGNOSTIC MAMMOGRAM  2022   [facility]; Per new patient packet   JOINT REPLACEMENT Left 08/24/2010   JOINT REPLACEMENT Left 04/25/2010   Reverse    ORIF PELVIC FRACTURE WITH PERCUTANEOUS SCREWS Right  10/30/2023   Procedure: CLOSED REDUCTION, PELVIS, WITH PERCUTANEOUS FIXATION;  Surgeon: Kendal Franky SQUIBB, MD;  Location: [facility] OR;  Service: Orthopedics;  Laterality: Right;   orthroscopic  Rotator Cuff Left 04/25/2009   Partial shoulder Left 04/26/2003   ROTATOR CUFF REPAIR     x 2 1998 and 1999   SPINE SURGERY  04/26/1983   disk   TONSILLECTOMY  04/25/1950   Dr. Jerome; Per new patient packet    reports that she has never smoked. She has never used smokeless tobacco. She reports that she does not currently use alcohol. She reports that she does not use drugs. Social History   Socioeconomic History   Marital status: Widowed    Spouse name: Not on file   Number of children: 2   Years of education: Not on file   Highest education level: Not on file  Occupational History   Occupation: Retired  Tobacco Use   Smoking status: Never   Smokeless tobacco: Never  Vaping Use   Vaping status: Never Used  Substance and Sexual Activity   Alcohol use: Not Currently   Drug use: No   Sexual activity: Not Currently    Birth control/protection: Post-menopausal  Other Topics Concern   Not on file  Social History Narrative   Diet: Left blank      Caffeine: Yes      Married, if yes what year: Widow; married in 1957       Do you live in a house, apartment, assisted living, condo, trailer, ect: [facility]       Is it one or more stories: Amedeo       How many persons live in your home? One      Pets: Cat      Highest level or education completed: Left blank      Current/Past profession: Artist       Exercise: Yes                 Type and how often: Walk         Living Will: Yes   DNR: Yes    POA/HPOA: Left blank       Functional Status:   Do you have difficulty bathing or dressing yourself? Left blank    Do you have difficulty preparing food or eating? Left blank    Do you have difficulty managing your medications? Left blank    Do you have difficulty managing your  finances? Left blank    Do you have difficulty affording your medications? Left blank    Social Drivers of Health   Financial Resource Strain: Low Risk  (12/21/2021)   Overall Financial Resource Strain (CARDIA)    Difficulty of Paying Living Expenses: Not hard at all  Food Insecurity: No Food Insecurity (10/25/2023)   Hunger Vital Sign    Worried About Running Out of Food in the Last Year: Never true    Ran Out of Food in the Last Year:  Never true  Transportation Needs: No Transportation Needs (10/25/2023)   PRAPARE - Administrator, Civil Service (Medical): No    Lack of Transportation (Non-Medical): No  Physical Activity: Not on file  Stress: Not on file  Social Connections: Moderately Isolated (10/25/2023)   Social Connection and Isolation Panel    Frequency of Communication with Friends and Family: More than three times a week    Frequency of Social Gatherings with Friends and Family: Three times a week    Attends Religious Services: Patient declined    Active Member of Clubs or Organizations: Patient declined    Attends Banker Meetings: More than 4 times per year    Marital Status: Widowed  Intimate Partner Violence: Not At Risk (10/25/2023)   Humiliation, Afraid, Rape, and Kick questionnaire    Fear of Current or Ex-Partner: No    Emotionally Abused: No    Physically Abused: No    Sexually Abused: No    Functional Status Survey:    Family History  Problem Relation Age of Onset   Cancer Mother        cervical   Alzheimer's disease Mother    Asthma Father    Other Father        Cerebral hemorrhage   Heart disease Sister        Heart disease before age 66   Hypertension Sister    Stroke Maternal Grandfather    Heart attack Maternal Grandfather    Breast cancer Daughter 51   Asthma Son    Cancer Cousin        dx early 25s; unknown type; paternal female cousin   Neuropathy Neg Hx     Health Maintenance  Topic Date Due   Medicare Annual  Wellness (AWV)  11/14/2023   COVID-19 Vaccine (7 - 2025-26 season) 12/25/2023   Influenza Vaccine  03/22/2024 (Originally 11/24/2023)   DTaP/Tdap/Td (2 - Td or Tdap) 06/13/2032   Pneumococcal Vaccine: 50+ Years  Completed   DEXA SCAN  Completed   Zoster Vaccines- Shingrix  Completed   HPV VACCINES  Aged Out   Meningococcal B Vaccine  Aged Out    Allergies  Allergen Reactions   Arimidex  [Anastrozole ] Other (See Comments)    Caused excessive sweating. Allergy not listed on MAR   Sulfa Antibiotics Other (See Comments)    The patient said she was told when she was 17 she was allergic to this.    Outpatient Encounter Medications as of 01/15/2024  Medication Sig   acetaminophen  (TYLENOL ) 500 MG tablet Take 500 mg by mouth every 8 (eight) hours as needed for mild pain (pain score 1-3) (or headaches).   aspirin  325 MG tablet Take 325 mg by mouth daily.   atenolol  (TENORMIN ) 25 MG tablet Take 25 mg by mouth at bedtime.   buPROPion  (WELLBUTRIN  XL) 150 MG 24 hr tablet Take 1 tablet (150 mg total) by mouth daily.   Cholecalciferol  (VITAMIN D3) 1000 units CAPS Take 2,000 Units by mouth in the morning.   Cyanocobalamin  (B-12) 1000 MCG TABS Take 1,000 mcg by mouth in the morning.   diclofenac Sodium (VOLTAREN) 1 % GEL Apply 1 Application topically every 6 (six) hours as needed (for pain).   docusate sodium  (COLACE) 100 MG capsule Take 1 capsule (100 mg total) by mouth 2 (two) times daily.   folic acid  (FOLVITE ) 800 MCG tablet Take 800 mcg by mouth at bedtime.   hydrALAZINE  (APRESOLINE ) 10 MG tablet Take 1 tablet (  10 mg total) by mouth as needed (for blood pressure 150 or above).   lidocaine  4 % Place 1 patch onto the skin in the morning and at bedtime.   loperamide (IMODIUM A-D) 2 MG tablet Take 4 mg by mouth every 6 (six) hours as needed for diarrhea or loose stools.   magnesium hydroxide (MILK OF MAGNESIA) 400 MG/5ML suspension Take 30 mLs by mouth as directed. **DAW** Give 30 ml by mouth every 24  hours as needed for constipation once daily If no BM in 24hrs, check rectum for hard stool. Remove stool digitally if present(see order for fleet enema). DO NOT USE FOR CHRONIC KIDNEY DISEASE STAGE FOUR OR HEMODIALYSIS PATIENTS   melatonin 5 MG TABS Take 5 mg by mouth as needed (For insomnia).   methocarbamol  (ROBAXIN ) 500 MG tablet Take 1 tablet (500 mg total) by mouth every 6 (six) hours as needed for muscle spasms.   ondansetron  (ZOFRAN ) 4 MG tablet Take 4 mg by mouth every 6 (six) hours as needed for nausea or vomiting.   pantoprazole  (PROTONIX ) 40 MG tablet Take 1 tablet (40 mg total) by mouth daily.   polyethylene glycol (MIRALAX  / GLYCOLAX ) 17 g packet Take 17 g by mouth daily.   SYSTANE COMPLETE PF 0.6 % SOLN Place 1 drop into both eyes 3 (three) times daily as needed (for dryness).   traMADol HCl 25 MG TABS Take 25 mg by mouth every 6 (six) hours as needed. Give 1 tablet by mouth every 6 hours as needed for 25mg  PO Q6hours PRN/Pain X14 days related to PAIN IN UNSPECIFIED JOINT (M25.50) for 14 Days   ferrous sulfate 325 (65 FE) MG EC tablet Take 325 mg by mouth daily. Give 325 mg by mouth one time a day for past low hemoglobin (Patient not taking: Reported on 01/15/2024)   No facility-administered encounter medications on file as of 01/15/2024.    Review of Systems  Constitutional:  Positive for activity change. Negative for appetite change.  HENT: Negative.    Respiratory:  Negative for cough and shortness of breath.   Cardiovascular:  Positive for leg swelling.  Gastrointestinal:  Positive for constipation.  Genitourinary:  Positive for frequency.  Musculoskeletal:  Positive for gait problem. Negative for arthralgias and myalgias.  Skin: Negative.   Neurological:  Positive for weakness. Negative for dizziness.  Psychiatric/Behavioral:  Positive for confusion and dysphoric mood. Negative for sleep disturbance. The patient is nervous/anxious.     Vitals:   01/15/24 1158  BP:  135/61  Pulse: 73  Resp: 16  Temp: 97.7 F (36.5 C)  SpO2: 97%  Weight: 104 lb 6.4 oz (47.4 kg)  Height: 5' 2 (1.575 m)   Body mass index is 19.1 kg/m. Physical Exam Vitals reviewed.  Constitutional:      Appearance: Normal appearance.  HENT:     Head: Normocephalic.     Nose: Nose normal.     Mouth/Throat:     Mouth: Mucous membranes are moist.     Pharynx: Oropharynx is clear.  Eyes:     Pupils: Pupils are equal, round, and reactive to light.  Cardiovascular:     Rate and Rhythm: Normal rate and regular rhythm.     Pulses: Normal pulses.     Heart sounds: Normal heart sounds. No murmur heard. Pulmonary:     Effort: Pulmonary effort is normal.     Breath sounds: Normal breath sounds.  Abdominal:     General: Abdomen is flat. Bowel sounds are normal.  Palpations: Abdomen is soft.  Musculoskeletal:        General: Swelling present.     Cervical back: Neck supple.     Comments: Tender Lower Thoracic area  Skin:    General: Skin is warm.  Neurological:     General: No focal deficit present.     Mental Status: She is alert.  Psychiatric:        Mood and Affect: Mood normal.        Thought Content: Thought content normal.     Labs reviewed: Basic Metabolic Panel: Recent Labs    10/08/23 1646 10/09/23 0509 10/12/23 0522 10/16/23 0000 10/29/23 0526 10/31/23 0703 12/04/23 0000 01/02/24 0000 01/12/24 1441  NA  --    < > 131*   < > 130* 132* 135* 131* 132*  K  --    < > 3.8   < > 4.6 3.9 4.4 4.0 4.1  CL  --    < > 98   < > 96* 100 102 97* 96*  CO2  --    < > 23   < > 25 23 25* 21 24  GLUCOSE  --    < > 94   < > 101* 117*  --   --  114*  BUN  --    < > 11   < > 9 13 15 13 15   CREATININE  --    < > 0.42*   < > 0.55 0.53 0.6 0.5 0.61  CALCIUM   --    < > 8.4*   < > 8.6* 8.4* 9.2 9.0 9.1  MG 1.9  --   --   --   --   --   --   --   --   PHOS  --   --  3.6  --   --   --   --   --   --    < > = values in this interval not displayed.   Liver Function  Tests: Recent Labs    09/26/23 0000 09/26/23 1020 10/12/23 0522 01/02/24 0000 01/12/24 1441  AST 19  --   --  15 18  ALT 13  --   --  9 5  ALKPHOS 66  --   --  58 60  BILITOT  --   --   --   --  0.4  PROT  --   --   --   --  6.4*  ALBUMIN  --    < > 3.1* 3.7 4.1   < > = values in this interval not displayed.   No results for input(s): LIPASE, AMYLASE in the last 8760 hours. No results for input(s): AMMONIA in the last 8760 hours. CBC: Recent Labs    10/27/23 0421 10/29/23 0526 10/31/23 0703 11/01/23 0605 12/04/23 0000 01/02/24 0000 01/12/24 1536  WBC 7.6 8.7 9.6 8.4 6.3 5.3 5.8  NEUTROABS 5.7 6.4  --   --   --   --  4.5  HGB 11.6* 11.1* 10.4* 10.5* 11.0* 10.8* 9.8*  HCT 35.3* 32.2* 31.3* 31.7* 32* 31* 29.2*  MCV 82.7 81.7 82.6 82.1  --   --  80.2  PLT 446* 437* 395 340 242 256 238   Cardiac Enzymes: Recent Labs    10/08/23 1646 10/27/23 1510  CKTOTAL 89 25*   BNP: Invalid input(s): POCBNP Lab Results  Component Value Date   HGBA1C 5.5 06/08/2022   Lab Results  Component Value Date  TSH 0.947 10/11/2023   Lab Results  Component Value Date   VITAMINB12 1,872 (H) 10/08/2023   Lab Results  Component Value Date   FOLATE 11.2 06/08/2022   Lab Results  Component Value Date   FERRITIN 281 10/08/2023    Imaging and Procedures obtained prior to SNF admission: DG Hip Unilat W or Wo Pelvis 2-3 Views Right Result Date: 01/12/2024 CLINICAL DATA:  Pain EXAM: DG HIP (WITH OR WITHOUT PELVIS) 2-3V RIGHT COMPARISON:  Pelvis x-ray 10/30/2023 FINDINGS: Horizontal screw traversing the sacroiliac joints and right superior pubic ramus appear unchanged in position. There are healing right superior and inferior pubic rami fractures. Alignment is anatomic. No acute fractures are identified. No evidence for hardware loosening or shifting. Joint spaces are maintained. IMPRESSION: 1. Healing right superior and inferior pubic rami fractures. 2. Hardware is unchanged in  position. Electronically Signed   By: Greig Pique M.D.   On: 01/12/2024 16:05   DG Chest Port 1 View Result Date: 01/12/2024 CLINICAL DATA:  Increasing confusion for 1 week, fell 1 week ago, short of breath EXAM: PORTABLE CHEST 1 VIEW COMPARISON:  05/05/2020 FINDINGS: Single frontal view of the chest demonstrates a stable cardiac silhouette. No acute airspace disease, effusion, or pneumothorax. Stable left shoulder arthroplasty. No acute bony abnormality. IMPRESSION: 1. No acute intrathoracic process. Electronically Signed   By: Ozell Daring M.D.   On: 01/12/2024 15:59   CT Head Wo Contrast Result Date: 01/12/2024 CLINICAL DATA:  Altered level of consciousness, increasing confusion for 1 week, prior fall EXAM: CT HEAD WITHOUT CONTRAST TECHNIQUE: Contiguous axial images were obtained from the base of the skull through the vertex without intravenous contrast. RADIATION DOSE REDUCTION: This exam was performed according to the departmental dose-optimization program which includes automated exposure control, adjustment of the mA and/or kV according to patient size and/or use of iterative reconstruction technique. COMPARISON:  06/16/2023 FINDINGS: Brain: No acute infarct or hemorrhage. Lateral ventricles and midline structures are unremarkable. No acute extra-axial fluid collections. No mass effect. Vascular: No hyperdense vessel or unexpected calcification. Skull: Normal. Negative for fracture or focal lesion. Sinuses/Orbits: No acute finding. Other: None. IMPRESSION: 1. Stable head CT, no acute intracranial process. Electronically Signed   By: Ozell Daring M.D.   On: 01/12/2024 15:39    Assessment/Plan 1. Acute bilateral low back pain without sciatica (Primary) Seems more like Lower Thoracic pain She is on Tramadol and Robaxin  Plan to [facility] Per patient  Her Chest Xray did not show anything acute in ED  2. Confusion Some recent Worsening Labs normal UA negative  3. Weakness Is going to  work with therapy Plan most likely for her to move to SNF  4. Iron deficiency anemia, unspecified iron deficiency anemia type Iron 3/week  5. Urinary frequency UA negative Will try Myrbetriq 25 mg  6. Primary hypertension BP has been low  7 LE edema 8 Depression Only Wellbutrin   Family/ staff Communication:   Labs/tests ordered:

## 2024-01-16 ENCOUNTER — Encounter: Payer: Self-pay | Admitting: Nurse Practitioner

## 2024-01-16 DIAGNOSIS — Z9181 History of falling: Secondary | ICD-10-CM | POA: Diagnosis not present

## 2024-01-16 DIAGNOSIS — R2689 Other abnormalities of gait and mobility: Secondary | ICD-10-CM | POA: Diagnosis not present

## 2024-01-16 DIAGNOSIS — M545 Low back pain, unspecified: Secondary | ICD-10-CM | POA: Diagnosis not present

## 2024-01-16 DIAGNOSIS — M5416 Radiculopathy, lumbar region: Secondary | ICD-10-CM | POA: Diagnosis not present

## 2024-01-16 DIAGNOSIS — M6281 Muscle weakness (generalized): Secondary | ICD-10-CM | POA: Diagnosis not present

## 2024-01-16 DIAGNOSIS — R2681 Unsteadiness on feet: Secondary | ICD-10-CM | POA: Diagnosis not present

## 2024-01-16 DIAGNOSIS — E871 Hypo-osmolality and hyponatremia: Secondary | ICD-10-CM | POA: Diagnosis not present

## 2024-01-16 DIAGNOSIS — D649 Anemia, unspecified: Secondary | ICD-10-CM | POA: Diagnosis not present

## 2024-01-16 DIAGNOSIS — R41841 Cognitive communication deficit: Secondary | ICD-10-CM | POA: Diagnosis not present

## 2024-01-16 NOTE — Progress Notes (Unsigned)
 Reported Na126 today, last Na 135 12/04/23, no excessive swelling, VS stable. No longer taking Mirtazapine , started Wellbutrin  10/2023. Encourage oral fluid intake, follow up provider in morning.

## 2024-01-17 DIAGNOSIS — R2681 Unsteadiness on feet: Secondary | ICD-10-CM | POA: Diagnosis not present

## 2024-01-17 DIAGNOSIS — R2689 Other abnormalities of gait and mobility: Secondary | ICD-10-CM | POA: Diagnosis not present

## 2024-01-17 DIAGNOSIS — R41841 Cognitive communication deficit: Secondary | ICD-10-CM | POA: Diagnosis not present

## 2024-01-17 DIAGNOSIS — Z9181 History of falling: Secondary | ICD-10-CM | POA: Diagnosis not present

## 2024-01-17 DIAGNOSIS — M6281 Muscle weakness (generalized): Secondary | ICD-10-CM | POA: Diagnosis not present

## 2024-01-18 ENCOUNTER — Encounter: Payer: Self-pay | Admitting: Adult Health

## 2024-01-18 ENCOUNTER — Non-Acute Institutional Stay (SKILLED_NURSING_FACILITY): Payer: Self-pay | Admitting: Adult Health

## 2024-01-18 DIAGNOSIS — R600 Localized edema: Secondary | ICD-10-CM

## 2024-01-18 DIAGNOSIS — I10 Essential (primary) hypertension: Secondary | ICD-10-CM

## 2024-01-18 DIAGNOSIS — Z9181 History of falling: Secondary | ICD-10-CM | POA: Diagnosis not present

## 2024-01-18 DIAGNOSIS — D509 Iron deficiency anemia, unspecified: Secondary | ICD-10-CM

## 2024-01-18 DIAGNOSIS — M545 Low back pain, unspecified: Secondary | ICD-10-CM

## 2024-01-18 DIAGNOSIS — R2681 Unsteadiness on feet: Secondary | ICD-10-CM | POA: Diagnosis not present

## 2024-01-18 DIAGNOSIS — E871 Hypo-osmolality and hyponatremia: Secondary | ICD-10-CM

## 2024-01-18 DIAGNOSIS — R2689 Other abnormalities of gait and mobility: Secondary | ICD-10-CM | POA: Diagnosis not present

## 2024-01-18 DIAGNOSIS — R41841 Cognitive communication deficit: Secondary | ICD-10-CM | POA: Diagnosis not present

## 2024-01-18 DIAGNOSIS — M6281 Muscle weakness (generalized): Secondary | ICD-10-CM | POA: Diagnosis not present

## 2024-01-18 DIAGNOSIS — F329 Major depressive disorder, single episode, unspecified: Secondary | ICD-10-CM | POA: Diagnosis not present

## 2024-01-18 MED ORDER — ACETAMINOPHEN 500 MG PO TABS: 500.0000 mg | ORAL_TABLET | Freq: Two times a day (BID) | ORAL | Status: DC

## 2024-01-18 MED ORDER — SODIUM CHLORIDE 1 G PO TABS: ORAL_TABLET | ORAL | Status: DC

## 2024-01-18 NOTE — Progress Notes (Signed)
 Location:  Medical illustrator of Service:  SNF (31) Provider:   Bari America, ANP [facility] [phone]   Charlanne Fredia CROME, MD  Patient Care Team: Charlanne Fredia CROME, MD as PCP - General (Internal Medicine) Lonni Slain, MD as PCP - Cardiology (Cardiology) Cary Doffing, MD as Attending Physician (Dermatology) Rosalva Sawyer, MD as Consulting Physician (Obstetrics and Gynecology) Leslee Reusing, MD as Consulting Physician (Ophthalmology) Tyree Nanetta SAILOR, RN as Oncology Nurse Navigator Curvin Deward MOULD, MD as Consulting Physician (General Surgery) Lanny Callander, MD as Consulting Physician (Hematology) Shannon Agent, MD as Consulting Physician (Radiation Oncology) Hunsucker, Donnice SAUNDERS, MD as Consulting Physician (Pulmonary Disease)  Extended Emergency Contact Information Primary Emergency Contact: Lefever,William Chip  [place] Home Phone: [phone] Work Phone: [phone] Mobile Phone: [phone] Relation: Son Secondary Emergency Contact: Ryan,Liz Address: [address] [place], [place] [address] [place] Phone: [phone] Relation: Daughter  Code Status:  DNR Goals of care: Advanced Directive information    10/25/2023    6:05 PM  Advanced Directives  Does Patient Have a Medical Advance Directive? Yes  Type of Advance Directive Out of facility DNR (pink MOST or yellow form)  Does patient want to make changes to medical advance directive? Yes (ED - send information to MyChart)  Pre-existing out of facility DNR order (yellow form or pink MOST form) Pink MOST/Yellow Form most recent copy in chart - Physician notified to receive inpatient order     Chief Complaint  Patient presents with   Acute Visit    hyponatremia    HPI:   The patient is a 89 year old with dementia currently resides in the skilled rehab area at [facility]  Sent to the ED for confusion and low back and  hip pain on 01/12/24 Hip xray showed healing right superior and inferior pubic rami fractures with screws in place Hardware unchanged CT of the head negative for acute change in the ED Hx of prior fall with pelvic fracture closed reduction with percutaneous fixation 10/30/23 Due to continued confusion and weakness she was admitted to rehab 01/12/24  Cognitive impairment and behavioral changes - Dementia with MMSE score of 23/30 on November 10, 2023, with deficits in orientation, attention calculation, and recall; failed clock test - Persistent confusion since admission to skilled care on January 12, 2024 - Frequently attempts to get up unassisted and requires reminders - Frequent crying and appears anxious  Low back and pelvic pain - Severe low back pain - Healing pelvic fractures; ambulates with walker during therapy - Lumbar x-ray negative - Pain persists despite physical therapy  Hyponatremia - Sodium level 126 on January 16, 2024 - History of low sodium prior to starting Wellbutrin  in July 2025 - Currently on Wellbutrin  150 mg daily - Remeron  discontinued due to lethargy and twitching  Microcytic anemia - Microcytosis with MCV 79.6 and hemoglobin 10.8 on January 02, 2024 - Started on iron supplementation three times a week on January 15, 2024  Lower extremity edema and blood pressure - Lower extremity edema present - Blood pressure slightly elevated at 147/71 - Currently on hydralazine   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   Diverticulosis    Esophageal reflux    Family history of breast cancer 11/25/2021   Hiatal hernia    Hx of colonic polyps    Hypertension  Per new patient packet   Mild asthma    Per new patient packet   Osteoarthritis    Osteopenia    bone density 1-09 and 2-13   Pulmonary nodules    scattered-CT Scan July 2010, 12-10, and 04-2010-all stable felt benign    Past Surgical History:   Procedure Laterality Date   BREAST LUMPECTOMY WITH RADIOACTIVE SEED LOCALIZATION Left 12/07/2021   Procedure: LEFT BREAST LUMPECTOMY WITH RADIOACTIVE SEED LOCALIZATION;  Surgeon: Curvin Deward MOULD, MD;  Location: [facility];  Service: General;  Laterality: Left;   CHOLECYSTECTOMY  04/25/1972   Gall Bladder; [facility]   DG PORTABLE CHEST X RAY ([facility] HX)  2023   Shoulder   DIAGNOSTIC MAMMOGRAM  2022   [facility]; Per new patient packet   JOINT REPLACEMENT Left 08/24/2010   JOINT REPLACEMENT Left 04/25/2010   Reverse    ORIF PELVIC FRACTURE WITH PERCUTANEOUS SCREWS Right 10/30/2023   Procedure: CLOSED REDUCTION, PELVIS, WITH PERCUTANEOUS FIXATION;  Surgeon: Kendal Franky SQUIBB, MD;  Location: [facility] OR;  Service: Orthopedics;  Laterality: Right;   orthroscopic  Rotator Cuff Left 04/25/2009   Partial shoulder Left 04/26/2003   ROTATOR CUFF REPAIR     x 2 1998 and 1999   SPINE SURGERY  04/26/1983   disk   TONSILLECTOMY  04/25/1950   Dr. Jerome; Per new patient packet    Allergies  Allergen Reactions   Arimidex  [Anastrozole ] Other (See Comments)    Caused excessive sweating. Allergy not listed on MAR   Sulfa Antibiotics Other (See Comments)    The patient said she was told when she was 17 she was allergic to this.    Outpatient Encounter Medications as of 01/18/2024  Medication Sig   sodium chloride  1 g tablet Take 500 mg bid   acetaminophen  (TYLENOL ) 500 MG tablet Take 500 mg by mouth every 8 (eight) hours as needed for mild pain (pain score 1-3) (or headaches).   aspirin  325 MG tablet Take 81 mg by mouth daily.   atenolol  (TENORMIN ) 25 MG tablet Take 25 mg by mouth at bedtime.   buPROPion  (WELLBUTRIN  XL) 150 MG 24 hr tablet Take 1 tablet (150 mg total) by mouth daily.   Cholecalciferol  (VITAMIN D3) 1000 units CAPS Take 2,000 Units by mouth in the morning.   Cyanocobalamin  (B-12) 1000 MCG TABS Take 1,000 mcg by mouth in the morning.   diclofenac Sodium (VOLTAREN) 1 % GEL Apply 1  Application topically every 6 (six) hours as needed (for pain).   docusate sodium  (COLACE) 100 MG capsule Take 1 capsule (100 mg total) by mouth 2 (two) times daily.   ferrous sulfate 325 (65 FE) MG EC tablet Take 325 mg by mouth 3 (three) times a week.   folic acid  (FOLVITE ) 800 MCG tablet Take 800 mcg by mouth at bedtime.   hydrALAZINE  (APRESOLINE ) 10 MG tablet Take 1 tablet (10 mg total) by mouth as needed (for blood pressure 150 or above).   lidocaine  4 % Place 1 patch onto the skin in the morning and at bedtime.   loperamide (IMODIUM A-D) 2 MG tablet Take 4 mg by mouth every 6 (six) hours as needed for diarrhea or loose stools.   magnesium hydroxide (MILK OF MAGNESIA) 400 MG/5ML suspension Take 30 mLs by mouth as directed. **DAW** Give 30 ml by mouth every 24 hours as needed for constipation once daily If no BM in 24hrs, check rectum for hard stool. Remove stool digitally if present(see order for fleet  enema). DO NOT USE FOR CHRONIC KIDNEY DISEASE STAGE FOUR OR HEMODIALYSIS PATIENTS   melatonin 5 MG TABS Take 5 mg by mouth as needed (For insomnia).   methocarbamol  (ROBAXIN ) 500 MG tablet Take 1 tablet (500 mg total) by mouth every 6 (six) hours as needed for muscle spasms.   ondansetron  (ZOFRAN ) 4 MG tablet Take 4 mg by mouth every 6 (six) hours as needed for nausea or vomiting.   pantoprazole  (PROTONIX ) 40 MG tablet Take 1 tablet (40 mg total) by mouth daily.   polyethylene glycol (MIRALAX  / GLYCOLAX ) 17 g packet Take 17 g by mouth daily.   SYSTANE COMPLETE PF 0.6 % SOLN Place 1 drop into both eyes 3 (three) times daily as needed (for dryness).   traMADol HCl 25 MG TABS Take 25 mg by mouth every 6 (six) hours as needed. Give 1 tablet by mouth every 6 hours as needed for 25mg  PO Q6hours PRN/Pain X14 days related to PAIN IN UNSPECIFIED JOINT (M25.50) for 14 Days   No facility-administered encounter medications on file as of 01/18/2024.    Review of Systems  Unable to perform ROS:  Dementia    Immunization History  Administered Date(s) Administered   Fluad  Trivalent(High Dose 65+) 01/03/2023   Influenza Split 01/10/2011, 12/27/2011, 01/09/2012, 01/26/2015, 12/17/2017, 01/12/2019, 12/19/2019, 12/19/2020   Influenza-Unspecified 12/24/2021   Moderna Covid-19 Vaccine Bivalent Booster 37yrs & up 02/05/2021, 09/27/2021, 01/27/2022   Moderna SARS-COV2 Booster Vaccination 03/10/2020, 11/24/2020, 12/11/2021   Moderna Sars-Covid-2 Vaccination 05/07/2019, 06/04/2019   PNEUMOCOCCAL CONJUGATE-20 04/01/2022   Pfizer(Comirnaty )Fall Seasonal Vaccine 12 years and older 01/03/2023, 07/25/2023   Pneumococcal Polysaccharide-23 08/02/2016, 07/21/2020, 11/10/2020   Respiratory Syncytial Virus Vaccine ,Recomb Aduvanted(Arexvy ) 04/14/2022   Tdap 06/13/2022   Zoster Recombinant(Shingrix) 09/06/2016, 11/22/2016   Pertinent  Health Maintenance Due  Topic Date Due   Influenza Vaccine  03/22/2024 (Originally 11/24/2023)   DEXA SCAN  Completed      11/14/2022    3:28 PM 11/15/2022    2:12 PM 04/04/2023    9:21 AM 06/06/2023    3:57 PM 07/24/2023    1:11 PM  Fall Risk  Falls in the past year? 0 0 1 1 1   Was there an injury with Fall? 0 0 1 1 1   Fall Risk Category Calculator 0 0 2 2 2   Patient at Risk for Falls Due to No Fall Risks No Fall Risks History of fall(s) History of fall(s);Impaired balance/gait No Fall Risks  Fall risk Follow up Falls evaluation completed Falls evaluation completed Falls evaluation completed Falls evaluation completed Falls evaluation completed   Functional Status Survey:    There were no vitals filed for this visit. There is no height or weight on file to calculate BMI. Physical Exam Vitals reviewed.  Constitutional:      General: She is not in acute distress.    Appearance: She is not diaphoretic.  HENT:     Head: Normocephalic and atraumatic.  Neck:     Vascular: No JVD.  Cardiovascular:     Rate and Rhythm: Normal rate and regular rhythm.     Heart  sounds: No murmur heard. Pulmonary:     Effort: Pulmonary effort is normal. No respiratory distress.     Breath sounds: Normal breath sounds. No wheezing.  Musculoskeletal:     Right lower leg: Edema present.     Left lower leg: Edema present.  Skin:    General: Skin is warm and dry.  Neurological:     General: No focal deficit  present.     Mental Status: She is alert.     Comments: Oriented to self and place but not time  Psychiatric:     Comments: crying     Labs reviewed: Recent Labs    10/08/23 1646 10/09/23 0509 10/12/23 0522 10/16/23 0000 10/29/23 0526 10/31/23 0703 12/04/23 0000 01/02/24 0000 01/12/24 1441  NA  --    < > 131*   < > 130* 132* 135* 131* 132*  K  --    < > 3.8   < > 4.6 3.9 4.4 4.0 4.1  CL  --    < > 98   < > 96* 100 102 97* 96*  CO2  --    < > 23   < > 25 23 25* 21 24  GLUCOSE  --    < > 94   < > 101* 117*  --   --  114*  BUN  --    < > 11   < > 9 13 15 13 15   CREATININE  --    < > 0.42*   < > 0.55 0.53 0.6 0.5 0.61  CALCIUM   --    < > 8.4*   < > 8.6* 8.4* 9.2 9.0 9.1  MG 1.9  --   --   --   --   --   --   --   --   PHOS  --   --  3.6  --   --   --   --   --   --    < > = values in this interval not displayed.   Recent Labs    09/26/23 0000 09/26/23 1020 10/12/23 0522 01/02/24 0000 01/12/24 1441  AST 19  --   --  15 18  ALT 13  --   --  9 5  ALKPHOS 66  --   --  58 60  BILITOT  --   --   --   --  0.4  PROT  --   --   --   --  6.4*  ALBUMIN  --    < > 3.1* 3.7 4.1   < > = values in this interval not displayed.   Recent Labs    10/27/23 0421 10/29/23 0526 10/31/23 0703 11/01/23 0605 12/04/23 0000 01/02/24 0000 01/12/24 1536  WBC 7.6 8.7 9.6 8.4 6.3 5.3 5.8  NEUTROABS 5.7 6.4  --   --   --   --  4.5  HGB 11.6* 11.1* 10.4* 10.5* 11.0* 10.8* 9.8*  HCT 35.3* 32.2* 31.3* 31.7* 32* 31* 29.2*  MCV 82.7 81.7 82.6 82.1  --   --  80.2  PLT 446* 437* 395 340 242 256 238   Lab Results  Component Value Date   TSH 0.947 10/11/2023    Lab Results  Component Value Date   HGBA1C 5.5 06/08/2022   Lab Results  Component Value Date   CHOL 136 04/13/2023   HDL 57 04/13/2023   LDLCALC 63 04/13/2023   TRIG 83 04/13/2023    Significant Diagnostic Results in last 30 days:  DG Hip Unilat W or Wo Pelvis 2-3 Views Right Result Date: 01/12/2024 CLINICAL DATA:  Pain EXAM: DG HIP (WITH OR WITHOUT PELVIS) 2-3V RIGHT COMPARISON:  Pelvis x-ray 10/30/2023 FINDINGS: Horizontal screw traversing the sacroiliac joints and right superior pubic ramus appear unchanged in position. There are healing right superior and inferior pubic rami fractures. Alignment is anatomic. No acute fractures  are identified. No evidence for hardware loosening or shifting. Joint spaces are maintained. IMPRESSION: 1. Healing right superior and inferior pubic rami fractures. 2. Hardware is unchanged in position. Electronically Signed   By: Greig Pique M.D.   On: 01/12/2024 16:05   DG Chest Port 1 View Result Date: 01/12/2024 CLINICAL DATA:  Increasing confusion for 1 week, fell 1 week ago, short of breath EXAM: PORTABLE CHEST 1 VIEW COMPARISON:  05/05/2020 FINDINGS: Single frontal view of the chest demonstrates a stable cardiac silhouette. No acute airspace disease, effusion, or pneumothorax. Stable left shoulder arthroplasty. No acute bony abnormality. IMPRESSION: 1. No acute intrathoracic process. Electronically Signed   By: Ozell Daring M.D.   On: 01/12/2024 15:59   CT Head Wo Contrast Result Date: 01/12/2024 CLINICAL DATA:  Altered level of consciousness, increasing confusion for 1 week, prior fall EXAM: CT HEAD WITHOUT CONTRAST TECHNIQUE: Contiguous axial images were obtained from the base of the skull through the vertex without intravenous contrast. RADIATION DOSE REDUCTION: This exam was performed according to the departmental dose-optimization program which includes automated exposure control, adjustment of the mA and/or kV according to patient size and/or use  of iterative reconstruction technique. COMPARISON:  06/16/2023 FINDINGS: Brain: No acute infarct or hemorrhage. Lateral ventricles and midline structures are unremarkable. No acute extra-axial fluid collections. No mass effect. Vascular: No hyperdense vessel or unexpected calcification. Skull: Normal. Negative for fracture or focal lesion. Sinuses/Orbits: No acute finding. Other: None. IMPRESSION: 1. Stable head CT, no acute intracranial process. Electronically Signed   By: Ozell Daring M.D.   On: 01/12/2024 15:39    Assessment/Plan   Hyponatremia Hyponatremia with sodium level of 126, potentially contributing to confusion. Unlikely related to Wellbutrin . - Administer 1 gram of sodium now. - Repeat BMP tomorrow. - Start given 1 gram of sodium now and then 500 mg bid  Dementia Underlying dementia with MMSE score of 23/30. Low sodium may contribute to confusion. Likely needs skilled care bed. - Continue skilled care. - Monitor cognition after sodium correction.  Healing pelvic fractures Healing pelvic fractures. Ambulating with walker and in physical therapy. - Continue skilled care and therapy.  Low back pain Low back pain with negative lumbar x-ray. Pain management necessary. - Order scheduled Tylenol  500 mg twice a day. - Order scheduled lidocaine  patch. Has seen Dr Bonner, scheduling back injection   Iron deficiency anemia with microcytosis Iron deficiency anemia with microcytosis. MCV 79.6, hemoglobin 10.8. Iron supplementation started. - Continue iron supplementation three times a week.  Edema of lower extremities Edema of lower extremities. Refusing compression hose. - Encourage wearing compression hose.  Hypertension Slightly elevated blood pressure at 147/71. Controlled on hydralazine .  Depression Appears anxious on Wellbutrin . Uncertain if pain or dementia contribute. - Continue Wellbutrin . Consider add on in the future (abilify?)  Family/ staff Communication:  nurse  Labs/tests ordered:  BMP in am

## 2024-01-19 DIAGNOSIS — E871 Hypo-osmolality and hyponatremia: Secondary | ICD-10-CM | POA: Diagnosis not present

## 2024-01-19 DIAGNOSIS — R41841 Cognitive communication deficit: Secondary | ICD-10-CM | POA: Diagnosis not present

## 2024-01-19 DIAGNOSIS — R2681 Unsteadiness on feet: Secondary | ICD-10-CM | POA: Diagnosis not present

## 2024-01-19 DIAGNOSIS — Z9181 History of falling: Secondary | ICD-10-CM | POA: Diagnosis not present

## 2024-01-20 ENCOUNTER — Telehealth: Payer: Self-pay | Admitting: Adult Health

## 2024-01-20 DIAGNOSIS — E871 Hypo-osmolality and hyponatremia: Secondary | ICD-10-CM

## 2024-01-20 MED ORDER — SODIUM CHLORIDE 1 G PO TABS: 1.0000 g | ORAL_TABLET | Freq: Two times a day (BID) | ORAL | Status: DC

## 2024-01-20 NOTE — Telephone Encounter (Signed)
 Pt with hx of SIADH NA 127 01/20/2024 NA 126 01/16/24 Will increase sodium to 1 gram bid  Recheck BMP Monday

## 2024-01-22 DIAGNOSIS — R2689 Other abnormalities of gait and mobility: Secondary | ICD-10-CM | POA: Diagnosis not present

## 2024-01-22 DIAGNOSIS — E871 Hypo-osmolality and hyponatremia: Secondary | ICD-10-CM | POA: Diagnosis not present

## 2024-01-22 DIAGNOSIS — Z9181 History of falling: Secondary | ICD-10-CM | POA: Diagnosis not present

## 2024-01-22 DIAGNOSIS — R2681 Unsteadiness on feet: Secondary | ICD-10-CM | POA: Diagnosis not present

## 2024-01-22 DIAGNOSIS — R41841 Cognitive communication deficit: Secondary | ICD-10-CM | POA: Diagnosis not present

## 2024-01-22 DIAGNOSIS — M6281 Muscle weakness (generalized): Secondary | ICD-10-CM | POA: Diagnosis not present

## 2024-01-23 DIAGNOSIS — R2681 Unsteadiness on feet: Secondary | ICD-10-CM | POA: Diagnosis not present

## 2024-01-23 DIAGNOSIS — Z9181 History of falling: Secondary | ICD-10-CM | POA: Diagnosis not present

## 2024-01-23 DIAGNOSIS — R2689 Other abnormalities of gait and mobility: Secondary | ICD-10-CM | POA: Diagnosis not present

## 2024-01-23 DIAGNOSIS — M6281 Muscle weakness (generalized): Secondary | ICD-10-CM | POA: Diagnosis not present

## 2024-01-23 DIAGNOSIS — R41841 Cognitive communication deficit: Secondary | ICD-10-CM | POA: Diagnosis not present

## 2024-01-24 DIAGNOSIS — Z9181 History of falling: Secondary | ICD-10-CM | POA: Diagnosis not present

## 2024-01-24 DIAGNOSIS — R2689 Other abnormalities of gait and mobility: Secondary | ICD-10-CM | POA: Diagnosis not present

## 2024-01-24 DIAGNOSIS — M6281 Muscle weakness (generalized): Secondary | ICD-10-CM | POA: Diagnosis not present

## 2024-01-24 DIAGNOSIS — R41841 Cognitive communication deficit: Secondary | ICD-10-CM | POA: Diagnosis not present

## 2024-01-24 DIAGNOSIS — R2681 Unsteadiness on feet: Secondary | ICD-10-CM | POA: Diagnosis not present

## 2024-01-25 DIAGNOSIS — R2689 Other abnormalities of gait and mobility: Secondary | ICD-10-CM | POA: Diagnosis not present

## 2024-01-25 DIAGNOSIS — R2681 Unsteadiness on feet: Secondary | ICD-10-CM | POA: Diagnosis not present

## 2024-01-25 DIAGNOSIS — Z9181 History of falling: Secondary | ICD-10-CM | POA: Diagnosis not present

## 2024-01-25 DIAGNOSIS — M6281 Muscle weakness (generalized): Secondary | ICD-10-CM | POA: Diagnosis not present

## 2024-01-25 DIAGNOSIS — R41841 Cognitive communication deficit: Secondary | ICD-10-CM | POA: Diagnosis not present

## 2024-01-26 DIAGNOSIS — R2689 Other abnormalities of gait and mobility: Secondary | ICD-10-CM | POA: Diagnosis not present

## 2024-01-26 DIAGNOSIS — R41841 Cognitive communication deficit: Secondary | ICD-10-CM | POA: Diagnosis not present

## 2024-01-26 DIAGNOSIS — Z9181 History of falling: Secondary | ICD-10-CM | POA: Diagnosis not present

## 2024-01-26 DIAGNOSIS — M6281 Muscle weakness (generalized): Secondary | ICD-10-CM | POA: Diagnosis not present

## 2024-01-26 DIAGNOSIS — R2681 Unsteadiness on feet: Secondary | ICD-10-CM | POA: Diagnosis not present

## 2024-01-29 ENCOUNTER — Encounter: Payer: Self-pay | Admitting: Internal Medicine

## 2024-01-29 ENCOUNTER — Non-Acute Institutional Stay (SKILLED_NURSING_FACILITY): Payer: Self-pay | Admitting: Internal Medicine

## 2024-01-29 DIAGNOSIS — E871 Hypo-osmolality and hyponatremia: Secondary | ICD-10-CM | POA: Diagnosis not present

## 2024-01-29 DIAGNOSIS — M6281 Muscle weakness (generalized): Secondary | ICD-10-CM | POA: Diagnosis not present

## 2024-01-29 DIAGNOSIS — R4189 Other symptoms and signs involving cognitive functions and awareness: Secondary | ICD-10-CM

## 2024-01-29 DIAGNOSIS — Z9181 History of falling: Secondary | ICD-10-CM | POA: Diagnosis not present

## 2024-01-29 DIAGNOSIS — F329 Major depressive disorder, single episode, unspecified: Secondary | ICD-10-CM

## 2024-01-29 DIAGNOSIS — R2689 Other abnormalities of gait and mobility: Secondary | ICD-10-CM | POA: Diagnosis not present

## 2024-01-29 DIAGNOSIS — R2681 Unsteadiness on feet: Secondary | ICD-10-CM | POA: Diagnosis not present

## 2024-01-29 DIAGNOSIS — R41841 Cognitive communication deficit: Secondary | ICD-10-CM | POA: Diagnosis not present

## 2024-01-29 NOTE — Progress Notes (Unsigned)
 Location:  [facility] Room Number: 154-P Place of Service:  SNF [phone]) Provider:  Charlanne Fredia CROME, MD  Charlanne Fredia CROME, MD  Patient Care Team: Charlanne Fredia CROME, MD as PCP - General (Internal Medicine) Lonni Slain, MD as PCP - Cardiology (Cardiology) Cary Doffing, MD as Attending Physician (Dermatology) Rosalva Sawyer, MD as Consulting Physician (Obstetrics and Gynecology) Leslee Reusing, MD as Consulting Physician (Ophthalmology) Tyree Nanetta SAILOR, RN as Oncology Nurse Navigator Curvin Deward MOULD, MD as Consulting Physician (General Surgery) Lanny Callander, MD as Consulting Physician (Hematology) Shannon Agent, MD as Consulting Physician (Radiation Oncology) Hunsucker, Donnice SAUNDERS, MD as Consulting Physician (Pulmonary Disease)  Extended Emergency Contact Information Primary Emergency Contact: Fuquay,William Chip  [place] Home Phone: [phone] Work Phone: [phone] Mobile Phone: [phone] Relation: Son Secondary Emergency Contact: Ryan,Liz Address: [address] [place], [place] [address] [place] Phone: [phone] Relation: Daughter  Code Status:  DNR Goals of care: Advanced Directive information    01/29/2024   12:20 PM  Advanced Directives  Does Patient Have a Medical Advance Directive? Yes  Type of Estate agent of [place];Out of facility DNR (pink MOST or yellow form)  Does patient want to make changes to medical advance directive? No - Patient declined  Copy of Healthcare Power of Attorney in Chart? Yes - validated most recent copy scanned in chart (See row information)  Pre-existing out of facility DNR order (yellow form or pink MOST form) Pink MOST form placed in chart (order not valid for inpatient use)     Chief Complaint  Patient presents with   Acute Visit    Depression    HPI:  Pt is a 89 y.o. female seen today for an acute visit for  Depression ,confusion and Anxiety  Patient was initially admitted to Hospital from 6/15-6/19 for Right Pubic Ramus fracture after a fall and Hyponatremia  She underwent Stabilization and percutaneous Screws on 07/07 for Uncontrolled pain   Did well with therapy was admitted in [facility] on 12/18/23   She had fall on 9/15 She says she fell as she lost her Balance Her Xrays were negative but was send to ED on 09/19 for Confusion Her CT head and Hip xrays were all with no acute changes Sodium was 132 HGB was 9.8   Send back to [facility] and now she is  admitted in Rehab Urine was negative   Patient continues to be very Emotional Crying anxious Told me she is depressed She also has been more Confused was struggling to come up with right words and could not answer orientation questions D/W Son and Nurses who also have noticed confusion She is walking with her walker Needs Mild assist with her ADLS  Taken off Remeron  due to jerking Could not tolerate SSRi due to Hyponatremia Right now she is on Wellbutrin  Wt Readings from Last 3 Encounters:  01/29/24 99 lb 12.8 oz (45.3 kg)  01/15/24 104 lb 6.4 oz (47.4 kg)  01/12/24 104 lb 12.8 oz (47.5 kg)      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   Diverticulosis    Esophageal reflux    Family history of breast cancer 11/25/2021   Hiatal hernia    Hx of colonic polyps    Hypertension    Per new patient packet   Mild asthma  Per new patient packet   Osteoarthritis    Osteopenia    bone density 1-09 and 2-13   Pulmonary nodules    scattered-CT Scan July 2010, 12-10, and 04-2010-all stable felt benign    Past Surgical History:  Procedure Laterality Date   BREAST LUMPECTOMY WITH RADIOACTIVE SEED LOCALIZATION Left 12/07/2021   Procedure: LEFT BREAST LUMPECTOMY WITH RADIOACTIVE SEED LOCALIZATION;  Surgeon: Curvin Deward MOULD, MD;  Location: [facility];  Service: General;  Laterality:  Left;   CHOLECYSTECTOMY  04/25/1972   Gall Bladder; [facility]   DG PORTABLE CHEST X RAY ([facility] HX)  2023   Shoulder   DIAGNOSTIC MAMMOGRAM  2022   [facility]; Per new patient packet   JOINT REPLACEMENT Left 08/24/2010   JOINT REPLACEMENT Left 04/25/2010   Reverse    ORIF PELVIC FRACTURE WITH PERCUTANEOUS SCREWS Right 10/30/2023   Procedure: CLOSED REDUCTION, PELVIS, WITH PERCUTANEOUS FIXATION;  Surgeon: Kendal Franky SQUIBB, MD;  Location: [facility] OR;  Service: Orthopedics;  Laterality: Right;   orthroscopic  Rotator Cuff Left 04/25/2009   Partial shoulder Left 04/26/2003   ROTATOR CUFF REPAIR     x 2 1998 and 1999   SPINE SURGERY  04/26/1983   disk   TONSILLECTOMY  04/25/1950   Dr. Jerome; Per new patient packet    Allergies  Allergen Reactions   Arimidex  [Anastrozole ] Other (See Comments)    Caused excessive sweating. Allergy not listed on MAR   Sulfa Antibiotics Other (See Comments)    The patient said she was told when she was 17 she was allergic to this.    Allergies as of 01/29/2024       Reactions   Arimidex  [anastrozole ] Other (See Comments)   Caused excessive sweating. Allergy not listed on MAR   Sulfa Antibiotics Other (See Comments)   The patient said she was told when she was 17 she was allergic to this.        Medication List        Accurate as of January 29, 2024 12:27 PM. If you have any questions, ask your nurse or doctor.          acetaminophen  500 MG tablet Commonly known as: TYLENOL  Take 500 mg by mouth every 8 (eight) hours as needed for mild pain (pain score 1-3) (or headaches).   acetaminophen  500 MG tablet Commonly known as: TYLENOL  Take 1 tablet (500 mg total) by mouth in the morning and at bedtime.   aspirin  325 MG tablet Take 81 mg by mouth daily.   atenolol  25 MG tablet Commonly known as: TENORMIN  Take 25 mg by mouth at bedtime.   B-12 1000 MCG Tabs Take 1,000 mcg by mouth in the morning.   buPROPion  150 MG 24 hr tablet Commonly known  as: Wellbutrin  XL Take 1 tablet (150 mg total) by mouth daily.   docusate sodium  100 MG capsule Commonly known as: COLACE Take 1 capsule (100 mg total) by mouth 2 (two) times daily.   ferrous sulfate 325 (65 FE) MG EC tablet Take 325 mg by mouth 3 (three) times a week.   folic acid  800 MCG tablet Commonly known as: FOLVITE  Take 800 mcg by mouth at bedtime.   hydrALAZINE  10 MG tablet Commonly known as: APRESOLINE  Take 1 tablet (10 mg total) by mouth as needed (for blood pressure 150 or above).   lidocaine  4 % Place 1 patch onto the skin in the morning and at bedtime.   loperamide 2 MG tablet Commonly  known as: IMODIUM A-D Take 4 mg by mouth every 6 (six) hours as needed for diarrhea or loose stools.   magnesium hydroxide 400 MG/5ML suspension Commonly known as: MILK OF MAGNESIA Take 30 mLs by mouth as directed. **DAW** Give 30 ml by mouth every 24 hours as needed for constipation once daily If no BM in 24hrs, check rectum for hard stool. Remove stool digitally if present(see order for fleet enema). DO NOT USE FOR CHRONIC KIDNEY DISEASE STAGE FOUR OR HEMODIALYSIS PATIENTS   melatonin 5 MG Tabs Take 5 mg by mouth as needed (For insomnia).   methocarbamol  500 MG tablet Commonly known as: ROBAXIN  Take 1 tablet (500 mg total) by mouth every 6 (six) hours as needed for muscle spasms.   Myrbetriq 25 MG Tb24 tablet Generic drug: mirabegron ER Take 25 mg by mouth daily.   ondansetron  4 MG tablet Commonly known as: ZOFRAN  Take 4 mg by mouth every 6 (six) hours as needed for nausea or vomiting.   pantoprazole  40 MG tablet Commonly known as: Protonix  Take 1 tablet (40 mg total) by mouth daily.   polyethylene glycol 17 g packet Commonly known as: MIRALAX  / GLYCOLAX  Take 17 g by mouth daily.   sodium chloride  1 g tablet Take 1 tablet (1 g total) by mouth 2 (two) times daily with a meal. Take 500 mg bid   Systane Complete PF 0.6 % Soln Generic drug: Propylene Glycol  (PF) Place 1 drop into both eyes 3 (three) times daily as needed (for dryness).   traMADol HCl 25 MG Tabs Take 25 mg by mouth every 6 (six) hours as needed. Give 1 tablet by mouth every 6 hours as needed for 25mg  PO Q6hours PRN/Pain X14 days related to PAIN IN UNSPECIFIED JOINT (M25.50) for 14 Days   Vitamin D3 1000 units Caps Take 2,000 Units by mouth in the morning.   Voltaren 1 % Gel Generic drug: diclofenac Sodium Apply 1 Application topically every 6 (six) hours as needed (for pain).        Review of Systems  Constitutional:  Positive for unexpected weight change. Negative for activity change and appetite change.  HENT: Negative.    Respiratory:  Negative for cough and shortness of breath.   Cardiovascular:  Negative for leg swelling.  Gastrointestinal:  Negative for constipation.  Genitourinary: Negative.   Musculoskeletal:  Negative for arthralgias, gait problem and myalgias.  Skin: Negative.   Neurological:  Positive for weakness. Negative for dizziness.  Psychiatric/Behavioral:  Positive for confusion and dysphoric mood. Negative for sleep disturbance. The patient is nervous/anxious.     Immunization History  Administered Date(s) Administered   Fluad  Trivalent(High Dose 65+) 01/03/2023   Influenza Split 01/10/2011, 12/27/2011, 01/09/2012, 01/26/2015, 12/17/2017, 01/12/2019, 12/19/2019, 12/19/2020   Influenza-Unspecified 12/24/2021   Moderna Covid-19 Vaccine Bivalent Booster 25yrs & up 02/05/2021, 09/27/2021, 01/27/2022   Moderna SARS-COV2 Booster Vaccination 03/10/2020, 11/24/2020, 12/11/2021   Moderna Sars-Covid-2 Vaccination 05/07/2019, 06/04/2019   PNEUMOCOCCAL CONJUGATE-20 04/01/2022   Pfizer(Comirnaty )Fall Seasonal Vaccine 12 years and older 01/03/2023, 07/25/2023   Pneumococcal Polysaccharide-23 08/02/2016, 07/21/2020, 11/10/2020   Respiratory Syncytial Virus Vaccine ,Recomb Aduvanted(Arexvy ) 04/14/2022   Tdap 06/13/2022   Zoster Recombinant(Shingrix)  09/06/2016, 11/22/2016   Pertinent  Health Maintenance Due  Topic Date Due   Influenza Vaccine  03/22/2024 (Originally 11/24/2023)   Mammogram  05/08/2024   DEXA SCAN  Completed      11/14/2022    3:28 PM 11/15/2022    2:12 PM 04/04/2023    9:21 AM 06/06/2023  3:57 PM 07/24/2023    1:11 PM  Fall Risk  Falls in the past year? 0 0 1 1 1   Was there an injury with Fall? 0 0 1 1 1   Fall Risk Category Calculator 0 0 2 2 2   Patient at Risk for Falls Due to No Fall Risks No Fall Risks History of fall(s) History of fall(s);Impaired balance/gait No Fall Risks  Fall risk Follow up Falls evaluation completed Falls evaluation completed Falls evaluation completed Falls evaluation completed Falls evaluation completed   Functional Status Survey:    Vitals:   01/29/24 1220  BP: (!) 127/56  Pulse: 73  Resp: 14  Temp: 98.2 F (36.8 C)  SpO2: 95%  Weight: 99 lb 12.8 oz (45.3 kg)   Body mass index is 18.25 kg/m. Physical Exam Vitals reviewed.  Constitutional:      Appearance: Normal appearance.  HENT:     Head: Normocephalic.     Nose: Nose normal.     Mouth/Throat:     Mouth: Mucous membranes are moist.     Pharynx: Oropharynx is clear.  Eyes:     Pupils: Pupils are equal, round, and reactive to light.  Cardiovascular:     Rate and Rhythm: Normal rate and regular rhythm.     Pulses: Normal pulses.     Heart sounds: Normal heart sounds. No murmur heard. Pulmonary:     Effort: Pulmonary effort is normal.     Breath sounds: Normal breath sounds.  Abdominal:     General: Abdomen is flat. Bowel sounds are normal.     Palpations: Abdomen is soft.  Musculoskeletal:        General: No swelling.     Cervical back: Neck supple.  Skin:    General: Skin is warm.  Neurological:     General: No focal deficit present.     Mental Status: She is alert.     Comments: Answered Orientation questions with Cueing  Psychiatric:     Comments: Anxiety present     Labs reviewed: Recent Labs     10/08/23 1646 10/09/23 0509 10/12/23 0522 10/16/23 0000 10/29/23 0526 10/31/23 0703 12/04/23 0000 01/02/24 0000 01/12/24 1441  NA  --    < > 131*   < > 130* 132* 135* 131* 132*  K  --    < > 3.8   < > 4.6 3.9 4.4 4.0 4.1  CL  --    < > 98   < > 96* 100 102 97* 96*  CO2  --    < > 23   < > 25 23 25* 21 24  GLUCOSE  --    < > 94   < > 101* 117*  --   --  114*  BUN  --    < > 11   < > 9 13 15 13 15   CREATININE  --    < > 0.42*   < > 0.55 0.53 0.6 0.5 0.61  CALCIUM   --    < > 8.4*   < > 8.6* 8.4* 9.2 9.0 9.1  MG 1.9  --   --   --   --   --   --   --   --   PHOS  --   --  3.6  --   --   --   --   --   --    < > = values in this interval not displayed.   Recent Labs  09/26/23 0000 09/26/23 1020 10/12/23 0522 01/02/24 0000 01/12/24 1441  AST 19  --   --  15 18  ALT 13  --   --  9 5  ALKPHOS 66  --   --  58 60  BILITOT  --   --   --   --  0.4  PROT  --   --   --   --  6.4*  ALBUMIN  --    < > 3.1* 3.7 4.1   < > = values in this interval not displayed.   Recent Labs    10/27/23 0421 10/29/23 0526 10/31/23 0703 11/01/23 0605 12/04/23 0000 01/02/24 0000 01/12/24 1536  WBC 7.6 8.7 9.6 8.4 6.3 5.3 5.8  NEUTROABS 5.7 6.4  --   --   --   --  4.5  HGB 11.6* 11.1* 10.4* 10.5* 11.0* 10.8* 9.8*  HCT 35.3* 32.2* 31.3* 31.7* 32* 31* 29.2*  MCV 82.7 81.7 82.6 82.1  --   --  80.2  PLT 446* 437* 395 340 242 256 238   Lab Results  Component Value Date   TSH 0.947 10/11/2023   Lab Results  Component Value Date   HGBA1C 5.5 06/08/2022   Lab Results  Component Value Date   CHOL 136 04/13/2023   HDL 57 04/13/2023   LDLCALC 63 04/13/2023   TRIG 83 04/13/2023    Significant Diagnostic Results in last 30 days:  DG Hip Unilat W or Wo Pelvis 2-3 Views Right Result Date: 01/12/2024 CLINICAL DATA:  Pain EXAM: DG HIP (WITH OR WITHOUT PELVIS) 2-3V RIGHT COMPARISON:  Pelvis x-ray 10/30/2023 FINDINGS: Horizontal screw traversing the sacroiliac joints and right superior pubic  ramus appear unchanged in position. There are healing right superior and inferior pubic rami fractures. Alignment is anatomic. No acute fractures are identified. No evidence for hardware loosening or shifting. Joint spaces are maintained. IMPRESSION: 1. Healing right superior and inferior pubic rami fractures. 2. Hardware is unchanged in position. Electronically Signed   By: Greig Pique M.D.   On: 01/12/2024 16:05   DG Chest Port 1 View Result Date: 01/12/2024 CLINICAL DATA:  Increasing confusion for 1 week, fell 1 week ago, short of breath EXAM: PORTABLE CHEST 1 VIEW COMPARISON:  05/05/2020 FINDINGS: Single frontal view of the chest demonstrates a stable cardiac silhouette. No acute airspace disease, effusion, or pneumothorax. Stable left shoulder arthroplasty. No acute bony abnormality. IMPRESSION: 1. No acute intrathoracic process. Electronically Signed   By: Ozell Daring M.D.   On: 01/12/2024 15:59   CT Head Wo Contrast Result Date: 01/12/2024 CLINICAL DATA:  Altered level of consciousness, increasing confusion for 1 week, prior fall EXAM: CT HEAD WITHOUT CONTRAST TECHNIQUE: Contiguous axial images were obtained from the base of the skull through the vertex without intravenous contrast. RADIATION DOSE REDUCTION: This exam was performed according to the departmental dose-optimization program which includes automated exposure control, adjustment of the mA and/or kV according to patient size and/or use of iterative reconstruction technique. COMPARISON:  06/16/2023 FINDINGS: Brain: No acute infarct or hemorrhage. Lateral ventricles and midline structures are unremarkable. No acute extra-axial fluid collections. No mass effect. Vascular: No hyperdense vessel or unexpected calcification. Skull: Normal. Negative for fracture or focal lesion. Sinuses/Orbits: No acute finding. Other: None. IMPRESSION: 1. Stable head CT, no acute intracranial process. Electronically Signed   By: Ozell Daring M.D.   On:  01/12/2024 15:39    Assessment/Plan 1. Reactive depression (Primary) Discontinue Wellbutrin  per family request to see  if it helps her anxiety and  Confusion Will just try Buspar 2.5 mg BID for now to help with anixety Also referral made to Dr Tasia 2. Hyponatremia Check Labs in 1 week Repeat BMP  3. Cognitive impairment Some worsening noticed Seems more due to her anxiety CT done few weeks ago did not show anything acute D/W son for Neuro follow up if continues with further decline  4 Weight loss Some weight loss noticed Was taken off Remeron  due to Side effects She is eating well Will continue to monitor  Family/ staff Communication: Son and Nursing staff Total time spent in this patient care encounter was  45_  minutes; greater than 50% of the visit spent counseling son and staff, reviewing records , Labs and coordinating care for problems addressed at this encounter.    Labs/tests ordered:

## 2024-01-30 DIAGNOSIS — Z9181 History of falling: Secondary | ICD-10-CM | POA: Diagnosis not present

## 2024-01-30 DIAGNOSIS — R2681 Unsteadiness on feet: Secondary | ICD-10-CM | POA: Diagnosis not present

## 2024-01-30 DIAGNOSIS — R41841 Cognitive communication deficit: Secondary | ICD-10-CM | POA: Diagnosis not present

## 2024-01-30 DIAGNOSIS — M6281 Muscle weakness (generalized): Secondary | ICD-10-CM | POA: Diagnosis not present

## 2024-01-30 DIAGNOSIS — R2689 Other abnormalities of gait and mobility: Secondary | ICD-10-CM | POA: Diagnosis not present

## 2024-01-30 DIAGNOSIS — S32810D Multiple fractures of pelvis with stable disruption of pelvic ring, subsequent encounter for fracture with routine healing: Secondary | ICD-10-CM | POA: Diagnosis not present

## 2024-01-31 DIAGNOSIS — Z9181 History of falling: Secondary | ICD-10-CM | POA: Diagnosis not present

## 2024-01-31 DIAGNOSIS — R2681 Unsteadiness on feet: Secondary | ICD-10-CM | POA: Diagnosis not present

## 2024-01-31 DIAGNOSIS — E871 Hypo-osmolality and hyponatremia: Secondary | ICD-10-CM | POA: Diagnosis not present

## 2024-01-31 DIAGNOSIS — M6281 Muscle weakness (generalized): Secondary | ICD-10-CM | POA: Diagnosis not present

## 2024-01-31 DIAGNOSIS — R42 Dizziness and giddiness: Secondary | ICD-10-CM | POA: Diagnosis not present

## 2024-01-31 DIAGNOSIS — R2689 Other abnormalities of gait and mobility: Secondary | ICD-10-CM | POA: Diagnosis not present

## 2024-01-31 DIAGNOSIS — R41841 Cognitive communication deficit: Secondary | ICD-10-CM | POA: Diagnosis not present

## 2024-02-01 DIAGNOSIS — R41841 Cognitive communication deficit: Secondary | ICD-10-CM | POA: Diagnosis not present

## 2024-02-01 DIAGNOSIS — R42 Dizziness and giddiness: Secondary | ICD-10-CM | POA: Diagnosis not present

## 2024-02-01 DIAGNOSIS — Z9181 History of falling: Secondary | ICD-10-CM | POA: Diagnosis not present

## 2024-02-01 DIAGNOSIS — R2681 Unsteadiness on feet: Secondary | ICD-10-CM | POA: Diagnosis not present

## 2024-02-01 DIAGNOSIS — E871 Hypo-osmolality and hyponatremia: Secondary | ICD-10-CM | POA: Diagnosis not present

## 2024-02-02 ENCOUNTER — Non-Acute Institutional Stay (SKILLED_NURSING_FACILITY): Payer: Self-pay | Admitting: Adult Health

## 2024-02-02 DIAGNOSIS — F05 Delirium due to known physiological condition: Secondary | ICD-10-CM | POA: Diagnosis not present

## 2024-02-02 DIAGNOSIS — F411 Generalized anxiety disorder: Secondary | ICD-10-CM

## 2024-02-02 DIAGNOSIS — R2689 Other abnormalities of gait and mobility: Secondary | ICD-10-CM | POA: Diagnosis not present

## 2024-02-02 DIAGNOSIS — Z9181 History of falling: Secondary | ICD-10-CM | POA: Diagnosis not present

## 2024-02-02 DIAGNOSIS — R2681 Unsteadiness on feet: Secondary | ICD-10-CM | POA: Diagnosis not present

## 2024-02-02 DIAGNOSIS — F329 Major depressive disorder, single episode, unspecified: Secondary | ICD-10-CM | POA: Diagnosis not present

## 2024-02-02 DIAGNOSIS — R42 Dizziness and giddiness: Secondary | ICD-10-CM | POA: Diagnosis not present

## 2024-02-02 DIAGNOSIS — R41841 Cognitive communication deficit: Secondary | ICD-10-CM | POA: Diagnosis not present

## 2024-02-02 DIAGNOSIS — E871 Hypo-osmolality and hyponatremia: Secondary | ICD-10-CM | POA: Diagnosis not present

## 2024-02-02 DIAGNOSIS — F0390 Unspecified dementia without behavioral disturbance: Secondary | ICD-10-CM | POA: Diagnosis not present

## 2024-02-02 DIAGNOSIS — M6281 Muscle weakness (generalized): Secondary | ICD-10-CM | POA: Diagnosis not present

## 2024-02-02 MED ORDER — BUSPIRONE HCL 5 MG PO TABS: 2.5000 mg | ORAL_TABLET | Freq: Three times a day (TID) | ORAL | Status: DC | PRN

## 2024-02-02 MED ORDER — BUSPIRONE HCL 5 MG PO TABS: 5.0000 mg | ORAL_TABLET | Freq: Three times a day (TID) | ORAL | Status: DC | PRN

## 2024-02-02 MED ORDER — BUSPIRONE HCL 5 MG PO TABS: 5.0000 mg | ORAL_TABLET | Freq: Two times a day (BID) | ORAL | Status: DC

## 2024-02-02 NOTE — Progress Notes (Signed)
 Location:      Place of Service:    Provider:   Bari America, ANP [facility] [phone]   Charlanne Fredia CROME, MD  Patient Care Team: Charlanne Fredia CROME, MD as PCP - General (Internal Medicine) Lonni Slain, MD as PCP - Cardiology (Cardiology) Cary Doffing, MD as Attending Physician (Dermatology) Rosalva Sawyer, MD as Consulting Physician (Obstetrics and Gynecology) Leslee Reusing, MD as Consulting Physician (Ophthalmology) Tyree Nanetta SAILOR, RN as Oncology Nurse Navigator Curvin Deward MOULD, MD as Consulting Physician (General Surgery) Lanny Callander, MD as Consulting Physician (Hematology) Shannon Agent, MD as Consulting Physician (Radiation Oncology) Hunsucker, Donnice SAUNDERS, MD as Consulting Physician (Pulmonary Disease)  Extended Emergency Contact Information Primary Emergency Contact: Therrien,William Chip  [place] Home Phone: [phone] Work Phone: [phone] Mobile Phone: [phone] Relation: Son Secondary Emergency Contact: Ryan,Liz Address: [address] [place], [place] [address] [place] Phone: [phone] Relation: Daughter  Code Status:  DNR Goals of care: Advanced Directive information    01/29/2024   12:20 PM  Advanced Directives  Does Patient Have a Medical Advance Directive? Yes  Type of Estate agent of [place];Out of facility DNR (pink MOST or yellow form)  Does patient want to make changes to medical advance directive? No - Patient declined  Copy of Healthcare Power of Attorney in Chart? Yes - validated most recent copy scanned in chart (See row information)  Pre-existing out of facility DNR order (yellow form or pink MOST form) Pink MOST form placed in chart (order not valid for inpatient use)     No chief complaint on file.   HPI:  Pt is a 89 y.o. female seen today for an acute visit for anxiety and depression.   The nurse gave her 5 mg of buspar this morning  which seemed to help with her anxiety. Prior dose of 2.5 was not effective. She has periods of crying and needing 1:1 attention. Expresses feeling anxious and depressed. Denies self harm. Makes attempts to get up without help. Needs assistance with all ADLs. ST is ordered for cognition. She is moving to skilled care due to underlying cognitive issues and care needs. Walks with a walker.  MMSE 23/22 November 2023  CT of the head with no acute process 01/12/24  Hyponatremia: NA 130 01/22/24, on sodium tablets. FR restriction.  Repeat ordered today but she refused.  Using compression hose with improved edema.   Last couple of bps were elevated when checked but she has been anxious.   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   Diverticulosis    Esophageal reflux    Family history of breast cancer 11/25/2021   Hiatal hernia    Hx of colonic polyps    Hypertension    Per new patient packet   Mild asthma    Per new patient packet   Osteoarthritis    Osteopenia    bone density 1-09 and 2-13   Pulmonary nodules    scattered-CT Scan July 2010, 12-10, and 04-2010-all stable felt benign    Past Surgical History:  Procedure Laterality Date   BREAST LUMPECTOMY WITH RADIOACTIVE SEED LOCALIZATION Left 12/07/2021   Procedure: LEFT BREAST LUMPECTOMY WITH RADIOACTIVE SEED LOCALIZATION;  Surgeon: Curvin Deward MOULD, MD;  Location: [facility];  Service: General;  Laterality: Left;   CHOLECYSTECTOMY  04/25/1972   [facility]  Bladder; [facility]   DG PORTABLE CHEST X RAY ([facility] HX)  2023   Shoulder   DIAGNOSTIC MAMMOGRAM  2022   [facility]; Per new patient packet   JOINT REPLACEMENT Left 08/24/2010   JOINT REPLACEMENT Left 04/25/2010   Reverse    ORIF PELVIC FRACTURE WITH PERCUTANEOUS SCREWS Right 10/30/2023   Procedure: CLOSED REDUCTION, PELVIS, WITH PERCUTANEOUS FIXATION;  Surgeon: Kendal Franky SQUIBB, MD;  Location: [facility] OR;  Service: Orthopedics;  Laterality:  Right;   orthroscopic  Rotator Cuff Left 04/25/2009   Partial shoulder Left 04/26/2003   ROTATOR CUFF REPAIR     x 2 1998 and 1999   SPINE SURGERY  04/26/1983   disk   TONSILLECTOMY  04/25/1950   Dr. Jerome; Per new patient packet    Allergies  Allergen Reactions   Arimidex  [Anastrozole ] Other (See Comments)    Caused excessive sweating. Allergy not listed on MAR   Sulfa Antibiotics Other (See Comments)    The patient said she was told when she was 17 she was allergic to this.    Outpatient Encounter Medications as of 02/02/2024  Medication Sig   busPIRone (BUSPAR) 5 MG tablet Take 1 tablet (5 mg total) by mouth 2 (two) times daily.   acetaminophen  (TYLENOL ) 500 MG tablet Take 500 mg by mouth every 8 (eight) hours as needed for mild pain (pain score 1-3) (or headaches).   acetaminophen  (TYLENOL ) 500 MG tablet Take 1 tablet (500 mg total) by mouth in the morning and at bedtime.   aspirin  325 MG tablet Take 81 mg by mouth daily.   atenolol  (TENORMIN ) 25 MG tablet Take 25 mg by mouth at bedtime.   busPIRone (BUSPAR) 5 MG tablet Take 1 tablet (5 mg total) by mouth 3 (three) times daily as needed.   Cholecalciferol  (VITAMIN D3) 1000 units CAPS Take 2,000 Units by mouth in the morning.   Cyanocobalamin  (B-12) 1000 MCG TABS Take 1,000 mcg by mouth in the morning.   diclofenac Sodium (VOLTAREN) 1 % GEL Apply 1 Application topically every 6 (six) hours as needed (for pain).   docusate sodium  (COLACE) 100 MG capsule Take 1 capsule (100 mg total) by mouth 2 (two) times daily.   ferrous sulfate 325 (65 FE) MG EC tablet Take 325 mg by mouth 3 (three) times a week.   folic acid  (FOLVITE ) 800 MCG tablet Take 800 mcg by mouth at bedtime.   hydrALAZINE  (APRESOLINE ) 10 MG tablet Take 1 tablet (10 mg total) by mouth as needed (for blood pressure 150 or above).   lidocaine  4 % Place 1 patch onto the skin in the morning and at bedtime.   loperamide (IMODIUM A-D) 2 MG tablet Take 4 mg by mouth every 6  (six) hours as needed for diarrhea or loose stools.   magnesium hydroxide (MILK OF MAGNESIA) 400 MG/5ML suspension Take 30 mLs by mouth as directed. **DAW** Give 30 ml by mouth every 24 hours as needed for constipation once daily If no BM in 24hrs, check rectum for hard stool. Remove stool digitally if present(see order for fleet enema). DO NOT USE FOR CHRONIC KIDNEY DISEASE STAGE FOUR OR HEMODIALYSIS PATIENTS   melatonin 5 MG TABS Take 5 mg by mouth as needed (For insomnia).   methocarbamol  (ROBAXIN ) 500 MG tablet Take 1 tablet (500 mg total) by mouth every 6 (six) hours as needed for muscle spasms.   MYRBETRIQ 25 MG TB24 tablet Take 25 mg by mouth daily.   ondansetron  (ZOFRAN ) 4 MG tablet  Take 4 mg by mouth every 6 (six) hours as needed for nausea or vomiting.   pantoprazole  (PROTONIX ) 40 MG tablet Take 1 tablet (40 mg total) by mouth daily.   polyethylene glycol (MIRALAX  / GLYCOLAX ) 17 g packet Take 17 g by mouth daily.   sodium chloride  1 g tablet Take 1 tablet (1 g total) by mouth 2 (two) times daily with a meal. Take 500 mg bid   SYSTANE COMPLETE PF 0.6 % SOLN Place 1 drop into both eyes 3 (three) times daily as needed (for dryness).   traMADol HCl 25 MG TABS Take 25 mg by mouth every 6 (six) hours as needed. Give 1 tablet by mouth every 6 hours as needed for 25mg  PO Q6hours PRN/Pain X14 days related to PAIN IN UNSPECIFIED JOINT (M25.50) for 14 Days   [DISCONTINUED] busPIRone (BUSPAR) 5 MG tablet Take 0.5 tablets (2.5 mg total) by mouth 3 (three) times daily as needed.   No facility-administered encounter medications on file as of 02/02/2024.    Review of Systems  Constitutional:  Negative for activity change, appetite change, chills, diaphoresis, fatigue and fever.  HENT:  Negative for congestion.   Respiratory:  Negative for cough, shortness of breath and wheezing.   Cardiovascular:  Negative for chest pain and leg swelling.  Gastrointestinal:  Negative for abdominal distention,  abdominal pain, constipation, diarrhea, nausea and vomiting.  Genitourinary:  Negative for difficulty urinating, dysuria and urgency.  Musculoskeletal:  Positive for back pain (improving) and gait problem (uses walker). Negative for myalgias and neck pain.  Skin:  Negative for rash.  Neurological:  Negative for dizziness and weakness.  Psychiatric/Behavioral:  Positive for agitation, confusion and dysphoric mood. Negative for self-injury and suicidal ideas. The patient is nervous/anxious.     Immunization History  Administered Date(s) Administered   Fluad  Trivalent(High Dose 65+) 01/03/2023   Influenza Split 01/10/2011, 12/27/2011, 01/09/2012, 01/26/2015, 12/17/2017, 01/12/2019, 12/19/2019, 12/19/2020   Influenza-Unspecified 12/24/2021   Moderna Covid-19 Vaccine Bivalent Booster 8yrs & up 02/05/2021, 09/27/2021, 01/27/2022   Moderna SARS-COV2 Booster Vaccination 03/10/2020, 11/24/2020, 12/11/2021   Moderna Sars-Covid-2 Vaccination 05/07/2019, 06/04/2019   PNEUMOCOCCAL CONJUGATE-20 04/01/2022   Pfizer(Comirnaty )Fall Seasonal Vaccine 12 years and older 01/03/2023, 07/25/2023   Pneumococcal Polysaccharide-23 08/02/2016, 07/21/2020, 11/10/2020   Respiratory Syncytial Virus Vaccine ,Recomb Aduvanted(Arexvy ) 04/14/2022   Tdap 06/13/2022   Zoster Recombinant(Shingrix) 09/06/2016, 11/22/2016   Pertinent  Health Maintenance Due  Topic Date Due   Influenza Vaccine  03/22/2024 (Originally 11/24/2023)   Mammogram  05/08/2024   DEXA SCAN  Completed      11/14/2022    3:28 PM 11/15/2022    2:12 PM 04/04/2023    9:21 AM 06/06/2023    3:57 PM 07/24/2023    1:11 PM  Fall Risk  Falls in the past year? 0 0 1 1 1   Was there an injury with Fall? 0 0 1 1 1   Fall Risk Category Calculator 0 0 2 2 2   Patient at Risk for Falls Due to No Fall Risks No Fall Risks History of fall(s) History of fall(s);Impaired balance/gait No Fall Risks  Fall risk Follow up Falls evaluation completed Falls evaluation completed  Falls evaluation completed Falls evaluation completed Falls evaluation completed   Functional Status Survey:    There were no vitals filed for this visit. There is no height or weight on file to calculate BMI. Physical Exam Vitals reviewed.  Constitutional:      Appearance: Normal appearance.  HENT:     Head: Normocephalic and atraumatic.  Cardiovascular:     Rate and Rhythm: Normal rate and regular rhythm.  Pulmonary:     Effort: Pulmonary effort is normal.     Breath sounds: Normal breath sounds.  Abdominal:     General: Bowel sounds are normal. There is no distension.     Palpations: Abdomen is soft.     Tenderness: There is no abdominal tenderness.  Musculoskeletal:     Right lower leg: Edema present.     Left lower leg: Edema present.  Skin:    General: Skin is warm and dry.  Neurological:     General: No focal deficit present.     Mental Status: She is alert. Mental status is at baseline.  Psychiatric:     Comments: anxious     Labs reviewed: Recent Labs    10/08/23 1646 10/09/23 0509 10/12/23 0522 10/16/23 0000 10/29/23 0526 10/31/23 0703 12/04/23 0000 01/02/24 0000 01/12/24 1441  NA  --    < > 131*   < > 130* 132* 135* 131* 132*  K  --    < > 3.8   < > 4.6 3.9 4.4 4.0 4.1  CL  --    < > 98   < > 96* 100 102 97* 96*  CO2  --    < > 23   < > 25 23 25* 21 24  GLUCOSE  --    < > 94   < > 101* 117*  --   --  114*  BUN  --    < > 11   < > 9 13 15 13 15   CREATININE  --    < > 0.42*   < > 0.55 0.53 0.6 0.5 0.61  CALCIUM   --    < > 8.4*   < > 8.6* 8.4* 9.2 9.0 9.1  MG 1.9  --   --   --   --   --   --   --   --   PHOS  --   --  3.6  --   --   --   --   --   --    < > = values in this interval not displayed.   Recent Labs    09/26/23 0000 09/26/23 1020 10/12/23 0522 01/02/24 0000 01/12/24 1441  AST 19  --   --  15 18  ALT 13  --   --  9 5  ALKPHOS 66  --   --  58 60  BILITOT  --   --   --   --  0.4  PROT  --   --   --   --  6.4*  ALBUMIN  --    < >  3.1* 3.7 4.1   < > = values in this interval not displayed.   Recent Labs    10/27/23 0421 10/29/23 0526 10/31/23 0703 11/01/23 0605 12/04/23 0000 01/02/24 0000 01/12/24 1536  WBC 7.6 8.7 9.6 8.4 6.3 5.3 5.8  NEUTROABS 5.7 6.4  --   --   --   --  4.5  HGB 11.6* 11.1* 10.4* 10.5* 11.0* 10.8* 9.8*  HCT 35.3* 32.2* 31.3* 31.7* 32* 31* 29.2*  MCV 82.7 81.7 82.6 82.1  --   --  80.2  PLT 446* 437* 395 340 242 256 238   Lab Results  Component Value Date   TSH 0.947 10/11/2023   Lab Results  Component Value Date   HGBA1C 5.5 06/08/2022   Lab  Results  Component Value Date   CHOL 136 04/13/2023   HDL 57 04/13/2023   LDLCALC 63 04/13/2023   TRIG 83 04/13/2023    Significant Diagnostic Results in last 30 days:  DG Hip Unilat W or Wo Pelvis 2-3 Views Right Result Date: 01/12/2024 CLINICAL DATA:  Pain EXAM: DG HIP (WITH OR WITHOUT PELVIS) 2-3V RIGHT COMPARISON:  Pelvis x-ray 10/30/2023 FINDINGS: Horizontal screw traversing the sacroiliac joints and right superior pubic ramus appear unchanged in position. There are healing right superior and inferior pubic rami fractures. Alignment is anatomic. No acute fractures are identified. No evidence for hardware loosening or shifting. Joint spaces are maintained. IMPRESSION: 1. Healing right superior and inferior pubic rami fractures. 2. Hardware is unchanged in position. Electronically Signed   By: Greig Pique M.D.   On: 01/12/2024 16:05   DG Chest Port 1 View Result Date: 01/12/2024 CLINICAL DATA:  Increasing confusion for 1 week, fell 1 week ago, short of breath EXAM: PORTABLE CHEST 1 VIEW COMPARISON:  05/05/2020 FINDINGS: Single frontal view of the chest demonstrates a stable cardiac silhouette. No acute airspace disease, effusion, or pneumothorax. Stable left shoulder arthroplasty. No acute bony abnormality. IMPRESSION: 1. No acute intrathoracic process. Electronically Signed   By: Ozell Daring M.D.   On: 01/12/2024 15:59   CT Head Wo  Contrast Result Date: 01/12/2024 CLINICAL DATA:  Altered level of consciousness, increasing confusion for 1 week, prior fall EXAM: CT HEAD WITHOUT CONTRAST TECHNIQUE: Contiguous axial images were obtained from the base of the skull through the vertex without intravenous contrast. RADIATION DOSE REDUCTION: This exam was performed according to the departmental dose-optimization program which includes automated exposure control, adjustment of the mA and/or kV according to patient size and/or use of iterative reconstruction technique. COMPARISON:  06/16/2023 FINDINGS: Brain: No acute infarct or hemorrhage. Lateral ventricles and midline structures are unremarkable. No acute extra-axial fluid collections. No mass effect. Vascular: No hyperdense vessel or unexpected calcification. Skull: Normal. Negative for fracture or focal lesion. Sinuses/Orbits: No acute finding. Other: None. IMPRESSION: 1. Stable head CT, no acute intracranial process. Electronically Signed   By: Ozell Daring M.D.   On: 01/12/2024 15:39    Assessment/Plan  Generalized anxiety disorder Buspar did seem to help Will schedule 5 mg bid Keep prn dosing.  F/U with Dr Tasia next week   Reactive depression Off wellbutrin  due to concerns that it may have caused hallucinations She is still having them but they are improved.   Consult psych ordered.   Hyponatremia On sodium 1 gram bid and fluid restriction  She refused the BMP today, will reorder for Monday  Delirium with dementia (HCC) Intermittent hallucinations, agitation, anxiety, feels depressed Off wellbutrin  due to concern for hallucinations which are present per staff but improving.  Moving to skilled care.  Prior UA neg Prior CT neg

## 2024-02-02 NOTE — Assessment & Plan Note (Addendum)
 Off wellbutrin  due to concerns that it may have caused hallucinations She is still having them but they are improved.   Consult psych ordered.

## 2024-02-02 NOTE — Assessment & Plan Note (Signed)
 Buspar did seem to help Will schedule 5 mg bid Keep prn dosing.  F/U with Dr Tasia next week

## 2024-02-02 NOTE — Assessment & Plan Note (Signed)
 Intermittent hallucinations, agitation, anxiety, feels depressed Off wellbutrin  due to concern for hallucinations which are present per staff but improving.  Moving to skilled care.  Prior UA neg Prior CT neg

## 2024-02-02 NOTE — Assessment & Plan Note (Signed)
 On sodium 1 gram bid and fluid restriction  She refused the BMP today, will reorder for Monday

## 2024-02-02 NOTE — Addendum Note (Signed)
 Addended by: Lupe Bonner LALA on: 02/02/2024 08:22 AM   Modules accepted: Orders

## 2024-02-04 ENCOUNTER — Encounter: Payer: Self-pay | Admitting: Adult Health

## 2024-02-05 DIAGNOSIS — Z9181 History of falling: Secondary | ICD-10-CM | POA: Diagnosis not present

## 2024-02-05 DIAGNOSIS — R2689 Other abnormalities of gait and mobility: Secondary | ICD-10-CM | POA: Diagnosis not present

## 2024-02-05 DIAGNOSIS — R41841 Cognitive communication deficit: Secondary | ICD-10-CM | POA: Diagnosis not present

## 2024-02-05 DIAGNOSIS — M6281 Muscle weakness (generalized): Secondary | ICD-10-CM | POA: Diagnosis not present

## 2024-02-05 DIAGNOSIS — R2681 Unsteadiness on feet: Secondary | ICD-10-CM | POA: Diagnosis not present

## 2024-02-05 DIAGNOSIS — R42 Dizziness and giddiness: Secondary | ICD-10-CM | POA: Diagnosis not present

## 2024-02-05 DIAGNOSIS — E871 Hypo-osmolality and hyponatremia: Secondary | ICD-10-CM | POA: Diagnosis not present

## 2024-02-06 DIAGNOSIS — E871 Hypo-osmolality and hyponatremia: Secondary | ICD-10-CM | POA: Diagnosis not present

## 2024-02-06 DIAGNOSIS — Z9181 History of falling: Secondary | ICD-10-CM | POA: Diagnosis not present

## 2024-02-06 DIAGNOSIS — R2681 Unsteadiness on feet: Secondary | ICD-10-CM | POA: Diagnosis not present

## 2024-02-06 DIAGNOSIS — M6281 Muscle weakness (generalized): Secondary | ICD-10-CM | POA: Diagnosis not present

## 2024-02-06 DIAGNOSIS — R2689 Other abnormalities of gait and mobility: Secondary | ICD-10-CM | POA: Diagnosis not present

## 2024-02-06 DIAGNOSIS — R42 Dizziness and giddiness: Secondary | ICD-10-CM | POA: Diagnosis not present

## 2024-02-06 DIAGNOSIS — R41841 Cognitive communication deficit: Secondary | ICD-10-CM | POA: Diagnosis not present

## 2024-02-07 DIAGNOSIS — R42 Dizziness and giddiness: Secondary | ICD-10-CM | POA: Diagnosis not present

## 2024-02-07 DIAGNOSIS — Z9181 History of falling: Secondary | ICD-10-CM | POA: Diagnosis not present

## 2024-02-07 DIAGNOSIS — R41841 Cognitive communication deficit: Secondary | ICD-10-CM | POA: Diagnosis not present

## 2024-02-07 DIAGNOSIS — M6281 Muscle weakness (generalized): Secondary | ICD-10-CM | POA: Diagnosis not present

## 2024-02-07 DIAGNOSIS — E871 Hypo-osmolality and hyponatremia: Secondary | ICD-10-CM | POA: Diagnosis not present

## 2024-02-07 DIAGNOSIS — R2689 Other abnormalities of gait and mobility: Secondary | ICD-10-CM | POA: Diagnosis not present

## 2024-02-08 DIAGNOSIS — R2689 Other abnormalities of gait and mobility: Secondary | ICD-10-CM | POA: Diagnosis not present

## 2024-02-08 DIAGNOSIS — R41841 Cognitive communication deficit: Secondary | ICD-10-CM | POA: Diagnosis not present

## 2024-02-08 DIAGNOSIS — E871 Hypo-osmolality and hyponatremia: Secondary | ICD-10-CM | POA: Diagnosis not present

## 2024-02-08 DIAGNOSIS — R42 Dizziness and giddiness: Secondary | ICD-10-CM | POA: Diagnosis not present

## 2024-02-08 DIAGNOSIS — Z9181 History of falling: Secondary | ICD-10-CM | POA: Diagnosis not present

## 2024-02-08 DIAGNOSIS — R2681 Unsteadiness on feet: Secondary | ICD-10-CM | POA: Diagnosis not present

## 2024-02-08 DIAGNOSIS — F332 Major depressive disorder, recurrent severe without psychotic features: Secondary | ICD-10-CM | POA: Diagnosis not present

## 2024-02-08 DIAGNOSIS — M6281 Muscle weakness (generalized): Secondary | ICD-10-CM | POA: Diagnosis not present

## 2024-02-09 DIAGNOSIS — D649 Anemia, unspecified: Secondary | ICD-10-CM | POA: Diagnosis not present

## 2024-02-09 DIAGNOSIS — M6281 Muscle weakness (generalized): Secondary | ICD-10-CM | POA: Diagnosis not present

## 2024-02-09 DIAGNOSIS — R41841 Cognitive communication deficit: Secondary | ICD-10-CM | POA: Diagnosis not present

## 2024-02-09 DIAGNOSIS — R2689 Other abnormalities of gait and mobility: Secondary | ICD-10-CM | POA: Diagnosis not present

## 2024-02-09 DIAGNOSIS — Z9181 History of falling: Secondary | ICD-10-CM | POA: Diagnosis not present

## 2024-02-09 DIAGNOSIS — R42 Dizziness and giddiness: Secondary | ICD-10-CM | POA: Diagnosis not present

## 2024-02-09 DIAGNOSIS — E871 Hypo-osmolality and hyponatremia: Secondary | ICD-10-CM | POA: Diagnosis not present

## 2024-02-12 DIAGNOSIS — R41841 Cognitive communication deficit: Secondary | ICD-10-CM | POA: Diagnosis not present

## 2024-02-12 DIAGNOSIS — E871 Hypo-osmolality and hyponatremia: Secondary | ICD-10-CM | POA: Diagnosis not present

## 2024-02-12 DIAGNOSIS — Z9181 History of falling: Secondary | ICD-10-CM | POA: Diagnosis not present

## 2024-02-12 DIAGNOSIS — R42 Dizziness and giddiness: Secondary | ICD-10-CM | POA: Diagnosis not present

## 2024-02-12 DIAGNOSIS — R2689 Other abnormalities of gait and mobility: Secondary | ICD-10-CM | POA: Diagnosis not present

## 2024-02-12 DIAGNOSIS — M6281 Muscle weakness (generalized): Secondary | ICD-10-CM | POA: Diagnosis not present

## 2024-02-13 ENCOUNTER — Encounter: Payer: Self-pay | Admitting: Orthopedic Surgery

## 2024-02-13 ENCOUNTER — Non-Acute Institutional Stay (SKILLED_NURSING_FACILITY): Payer: Self-pay | Admitting: Orthopedic Surgery

## 2024-02-13 DIAGNOSIS — Z91148 Patient's other noncompliance with medication regimen for other reason: Secondary | ICD-10-CM

## 2024-02-13 DIAGNOSIS — R419 Unspecified symptoms and signs involving cognitive functions and awareness: Secondary | ICD-10-CM | POA: Diagnosis not present

## 2024-02-13 DIAGNOSIS — R41841 Cognitive communication deficit: Secondary | ICD-10-CM | POA: Diagnosis not present

## 2024-02-13 DIAGNOSIS — F323 Major depressive disorder, single episode, severe with psychotic features: Secondary | ICD-10-CM

## 2024-02-13 DIAGNOSIS — M6281 Muscle weakness (generalized): Secondary | ICD-10-CM | POA: Diagnosis not present

## 2024-02-13 DIAGNOSIS — E871 Hypo-osmolality and hyponatremia: Secondary | ICD-10-CM

## 2024-02-13 DIAGNOSIS — R443 Hallucinations, unspecified: Secondary | ICD-10-CM

## 2024-02-13 DIAGNOSIS — R42 Dizziness and giddiness: Secondary | ICD-10-CM | POA: Diagnosis not present

## 2024-02-13 DIAGNOSIS — R2689 Other abnormalities of gait and mobility: Secondary | ICD-10-CM | POA: Diagnosis not present

## 2024-02-13 DIAGNOSIS — R2681 Unsteadiness on feet: Secondary | ICD-10-CM | POA: Diagnosis not present

## 2024-02-13 DIAGNOSIS — Z9181 History of falling: Secondary | ICD-10-CM | POA: Diagnosis not present

## 2024-02-13 MED ORDER — LORAZEPAM 0.5 MG PO TABS: 0.2500 mg | ORAL_TABLET | Freq: Two times a day (BID) | ORAL | Status: DC | PRN

## 2024-02-13 NOTE — Progress Notes (Signed)
 Location:  [facility] Room Number: 105/A Place of Service:  SNF [phone]) Provider:  Greig FORBES Cluster, NP   Charlanne Fredia CROME, MD  Patient Care Team: Charlanne Fredia CROME, MD as PCP - General (Internal Medicine) Lonni Slain, MD as PCP - Cardiology (Cardiology) Cary Doffing, MD as Attending Physician (Dermatology) Rosalva Sawyer, MD as Consulting Physician (Obstetrics and Gynecology) Leslee Reusing, MD as Consulting Physician (Ophthalmology) Tyree Nanetta SAILOR, RN as Oncology Nurse Navigator Curvin Deward MOULD, MD as Consulting Physician (General Surgery) Lanny Callander, MD as Consulting Physician (Hematology) Shannon Agent, MD as Consulting Physician (Radiation Oncology) Hunsucker, Donnice SAUNDERS, MD as Consulting Physician (Pulmonary Disease)  Extended Emergency Contact Information Primary Emergency Contact: Gram,William Chip  [place] Home Phone: [phone] Work Phone: [phone] Mobile Phone: [phone] Relation: Son Secondary Emergency Contact: Ryan,Liz Address: [address] [place], [place] [address] [place] Phone: [phone] Relation: Daughter  Code Status:  DNR Goals of care: Advanced Directive information    01/29/2024   12:20 PM  Advanced Directives  Does Patient Have a Medical Advance Directive? Yes  Type of Estate agent of [place];Out of facility DNR (pink MOST or yellow form)  Does patient want to make changes to medical advance directive? No - Patient declined  Copy of Healthcare Power of Attorney in Chart? Yes - validated most recent copy scanned in chart (See row information)  Pre-existing out of facility DNR order (yellow form or pink MOST form) Pink MOST form placed in chart (order not valid for inpatient use)     Chief Complaint  Patient presents with   Acute Visit    Hallucinations, medication refusal    HPI:  Pt is a 89 y.o. female seen today for  acute visit due to hallucinations and medication refusal.   She currently resides on the skilled nursing unit at [facility]. 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   Poor historian due to cognitive impairment. She had been observed with increased confusion and depression since fall 12/2023. Unable to tolerate Remeron  due to muscle jerking. Poor candidate for SSRI due to hyponatremia. 10/17 she was evaluated by Dr. Tasia. Zoloft 50 mg was ordered and wellbutrin  stopped. She has taken 4 doses per chart review. Over the weekend she was asking staff  if people were going to rape or kill her. She has refused taking medications as well. She is pleasant during our encounter, only alert to self and place. She is unable to answer simple questions at times. She kept talking to another person in room but could not say who it was. She was unable to tell me who family members were in photos around her room. Nursing also reports poor oral hydration and not eating meals well. Afebrile. Vitals stable.   Recent Na+ was 132.   06/2023 CT head noted stable mild periventricular white matter disease, likely chronic microvascular ischemia.   12/2023 CT head was stable, no acute intracranial abnormalities.     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   Diverticulosis    Esophageal reflux    Family history of breast cancer 11/25/2021   Hiatal hernia    Hx of colonic polyps    Hypertension    Per new patient packet   Mild asthma    Per  new patient packet   Osteoarthritis    Osteopenia    bone density 1-09 and 2-13   Pulmonary nodules    scattered-CT Scan July 2010, 12-10, and 04-2010-all stable felt benign    Past Surgical History:  Procedure Laterality Date   BREAST LUMPECTOMY WITH RADIOACTIVE SEED LOCALIZATION Left 12/07/2021   Procedure: LEFT BREAST LUMPECTOMY WITH  RADIOACTIVE SEED LOCALIZATION;  Surgeon: Curvin Deward MOULD, MD;  Location: [facility];  Service: General;  Laterality: Left;   CHOLECYSTECTOMY  04/25/1972   Gall Bladder; [facility]   DG PORTABLE CHEST X RAY ([facility] HX)  2023   Shoulder   DIAGNOSTIC MAMMOGRAM  2022   [facility]; Per new patient packet   JOINT REPLACEMENT Left 08/24/2010   JOINT REPLACEMENT Left 04/25/2010   Reverse    ORIF PELVIC FRACTURE WITH PERCUTANEOUS SCREWS Right 10/30/2023   Procedure: CLOSED REDUCTION, PELVIS, WITH PERCUTANEOUS FIXATION;  Surgeon: Kendal Franky SQUIBB, MD;  Location: [facility] OR;  Service: Orthopedics;  Laterality: Right;   orthroscopic  Rotator Cuff Left 04/25/2009   Partial shoulder Left 04/26/2003   ROTATOR CUFF REPAIR     x 2 1998 and 1999   SPINE SURGERY  04/26/1983   disk   TONSILLECTOMY  04/25/1950   Dr. Jerome; Per new patient packet    Allergies  Allergen Reactions   Arimidex  [Anastrozole ] Other (See Comments)    Caused excessive sweating. Allergy not listed on MAR   Sulfa Antibiotics Other (See Comments)    The patient said she was told when she was 17 she was allergic to this.    Outpatient Encounter Medications as of 02/13/2024  Medication Sig   acetaminophen  (TYLENOL ) 500 MG tablet Take 500 mg by mouth every 8 (eight) hours as needed for mild pain (pain score 1-3) (or headaches).   acetaminophen  (TYLENOL ) 500 MG tablet Take 1 tablet (500 mg total) by mouth in the morning and at bedtime.   aspirin  325 MG tablet Take 81 mg by mouth daily.   atenolol  (TENORMIN ) 25 MG tablet Take 25 mg by mouth at bedtime.   busPIRone (BUSPAR) 5 MG tablet Take 1 tablet (5 mg total) by mouth 3 (three) times daily as needed.   busPIRone (BUSPAR) 5 MG tablet Take 1 tablet (5 mg total) by mouth 2 (two) times daily.   Cholecalciferol  (VITAMIN D3) 1000 units CAPS Take 2,000 Units by mouth in the morning.   Cyanocobalamin  (B-12) 1000 MCG TABS Take 1,000 mcg by mouth in the morning.   diclofenac Sodium  (VOLTAREN) 1 % GEL Apply 1 Application topically every 6 (six) hours as needed (for pain).   docusate sodium  (COLACE) 100 MG capsule Take 1 capsule (100 mg total) by mouth 2 (two) times daily.   ferrous sulfate 325 (65 FE) MG EC tablet Take 325 mg by mouth 3 (three) times a week.   folic acid  (FOLVITE ) 800 MCG tablet Take 800 mcg by mouth at bedtime.   hydrALAZINE  (APRESOLINE ) 10 MG tablet Take 1 tablet (10 mg total) by mouth as needed (for blood pressure 150 or above).   lidocaine  4 % Place 1 patch onto the skin in the morning and at bedtime.   loperamide (IMODIUM A-D) 2 MG tablet Take 4 mg by mouth every 6 (six) hours as needed for diarrhea or loose stools.   magnesium hydroxide (MILK OF MAGNESIA) 400 MG/5ML suspension Take 30 mLs by mouth as directed. **DAW** Give 30 ml by mouth every 24 hours as needed for constipation once  daily If no BM in 24hrs, check rectum for hard stool. Remove stool digitally if present(see order for fleet enema). DO NOT USE FOR CHRONIC KIDNEY DISEASE STAGE FOUR OR HEMODIALYSIS PATIENTS   melatonin 5 MG TABS Take 5 mg by mouth as needed (For insomnia).   methocarbamol  (ROBAXIN ) 500 MG tablet Take 1 tablet (500 mg total) by mouth every 6 (six) hours as needed for muscle spasms.   MYRBETRIQ 25 MG TB24 tablet Take 25 mg by mouth daily.   ondansetron  (ZOFRAN ) 4 MG tablet Take 4 mg by mouth every 6 (six) hours as needed for nausea or vomiting.   pantoprazole  (PROTONIX ) 40 MG tablet Take 1 tablet (40 mg total) by mouth daily.   polyethylene glycol (MIRALAX  / GLYCOLAX ) 17 g packet Take 17 g by mouth daily.   sodium chloride  1 g tablet Take 1 tablet (1 g total) by mouth 2 (two) times daily with a meal. Take 500 mg bid   SYSTANE COMPLETE PF 0.6 % SOLN Place 1 drop into both eyes 3 (three) times daily as needed (for dryness).   traMADol HCl 25 MG TABS Take 25 mg by mouth every 6 (six) hours as needed. Give 1 tablet by mouth every 6 hours as needed for 25mg  PO Q6hours PRN/Pain  X14 days related to PAIN IN UNSPECIFIED JOINT (M25.50) for 14 Days   No facility-administered encounter medications on file as of 02/13/2024.    Review of Systems  Unable to perform ROS: Psychiatric disorder    Immunization History  Administered Date(s) Administered   Fluad  Trivalent(High Dose 65+) 01/03/2023   Influenza Split 01/10/2011, 12/27/2011, 01/09/2012, 01/26/2015, 12/17/2017, 01/12/2019, 12/19/2019, 12/19/2020   Influenza-Unspecified 12/24/2021   Moderna Covid-19 Vaccine Bivalent Booster 18yrs & up 02/05/2021, 09/27/2021, 01/27/2022   Moderna SARS-COV2 Booster Vaccination 03/10/2020, 11/24/2020, 12/11/2021   Moderna Sars-Covid-2 Vaccination 05/07/2019, 06/04/2019   PNEUMOCOCCAL CONJUGATE-20 04/01/2022   Pfizer(Comirnaty )Fall Seasonal Vaccine 12 years and older 01/03/2023, 07/25/2023   Pneumococcal Polysaccharide-23 08/02/2016, 07/21/2020, 11/10/2020   Respiratory Syncytial Virus Vaccine ,Recomb Aduvanted(Arexvy ) 04/14/2022   Tdap 06/13/2022   Zoster Recombinant(Shingrix) 09/06/2016, 11/22/2016   Pertinent  Health Maintenance Due  Topic Date Due   Influenza Vaccine  03/22/2024 (Originally 11/24/2023)   Mammogram  05/08/2024   DEXA SCAN  Completed      11/14/2022    3:28 PM 11/15/2022    2:12 PM 04/04/2023    9:21 AM 06/06/2023    3:57 PM 07/24/2023    1:11 PM  Fall Risk  Falls in the past year? 0 0 1 1 1   Was there an injury with Fall? 0 0 1 1 1   Fall Risk Category Calculator 0 0 2 2 2   Patient at Risk for Falls Due to No Fall Risks No Fall Risks History of fall(s) History of fall(s);Impaired balance/gait No Fall Risks  Fall risk Follow up Falls evaluation completed Falls evaluation completed Falls evaluation completed Falls evaluation completed Falls evaluation completed   Functional Status Survey:    Vitals:   02/13/24 1203  BP: (!) 154/76  Pulse: 69  Resp: 18  Temp: (!) 97.2 F (36.2 C)  TempSrc: Oral  SpO2: 98%  Weight: 99 lb 12.8 oz (45.3 kg)  Height:  5' 2 (1.575 m)   Body mass index is 18.25 kg/m. Physical Exam Vitals reviewed.  Constitutional:      General: She is not in acute distress. HENT:     Head: Normocephalic and atraumatic.     Mouth/Throat:     Mouth: Mucous  membranes are moist.  Eyes:     General:        Right eye: No discharge.        Left eye: No discharge.     Pupils: Pupils are equal, round, and reactive to light.  Cardiovascular:     Rate and Rhythm: Normal rate and regular rhythm.     Pulses: Normal pulses.     Heart sounds: Normal heart sounds.  Pulmonary:     Effort: Pulmonary effort is normal.     Breath sounds: Normal breath sounds.  Abdominal:     General: Bowel sounds are normal. There is no distension.     Palpations: Abdomen is soft.     Tenderness: There is no abdominal tenderness.  Musculoskeletal:     Cervical back: Neck supple.     Right lower leg: No edema.     Left lower leg: No edema.  Skin:    General: Skin is warm.     Capillary Refill: Capillary refill takes less than 2 seconds.  Neurological:     General: No focal deficit present.     Mental Status: She is alert. She is disoriented.     Gait: Gait abnormal.  Psychiatric:        Attention and Perception: She perceives visual hallucinations.        Behavior: Behavior is actively hallucinating.     Comments: Mild aphasia, alert to self/place, follows some commands     Labs reviewed: Recent Labs    10/08/23 1646 10/09/23 0509 10/12/23 0522 10/16/23 0000 10/29/23 0526 10/31/23 0703 12/04/23 0000 01/02/24 0000 01/12/24 1441  NA  --    < > 131*   < > 130* 132* 135* 131* 132*  K  --    < > 3.8   < > 4.6 3.9 4.4 4.0 4.1  CL  --    < > 98   < > 96* 100 102 97* 96*  CO2  --    < > 23   < > 25 23 25* 21 24  GLUCOSE  --    < > 94   < > 101* 117*  --   --  114*  BUN  --    < > 11   < > 9 13 15 13 15   CREATININE  --    < > 0.42*   < > 0.55 0.53 0.6 0.5 0.61  CALCIUM   --    < > 8.4*   < > 8.6* 8.4* 9.2 9.0 9.1  MG 1.9  --   --    --   --   --   --   --   --   PHOS  --   --  3.6  --   --   --   --   --   --    < > = values in this interval not displayed.   Recent Labs    09/26/23 0000 09/26/23 1020 10/12/23 0522 01/02/24 0000 01/12/24 1441  AST 19  --   --  15 18  ALT 13  --   --  9 5  ALKPHOS 66  --   --  58 60  BILITOT  --   --   --   --  0.4  PROT  --   --   --   --  6.4*  ALBUMIN  --    < > 3.1* 3.7 4.1   < > = values in this  interval not displayed.   Recent Labs    10/27/23 0421 10/29/23 0526 10/31/23 0703 11/01/23 0605 12/04/23 0000 01/02/24 0000 01/12/24 1536  WBC 7.6 8.7 9.6 8.4 6.3 5.3 5.8  NEUTROABS 5.7 6.4  --   --   --   --  4.5  HGB 11.6* 11.1* 10.4* 10.5* 11.0* 10.8* 9.8*  HCT 35.3* 32.2* 31.3* 31.7* 32* 31* 29.2*  MCV 82.7 81.7 82.6 82.1  --   --  80.2  PLT 446* 437* 395 340 242 256 238   Lab Results  Component Value Date   TSH 0.947 10/11/2023   Lab Results  Component Value Date   HGBA1C 5.5 06/08/2022   Lab Results  Component Value Date   CHOL 136 04/13/2023   HDL 57 04/13/2023   LDLCALC 63 04/13/2023   TRIG 83 04/13/2023    Significant Diagnostic Results in last 30 days:  No results found.  Assessment/Plan 1. Hallucinations (Primary) - onset x 2 days - visual - non compliant with medications - DON to reach out to Dr. Tasia  - h/o hyponatremia and poor po intake - stat cbc/diff, bmp, UA/culture  - start ativan 0.25 mg/ML get BID prn  - consider Abilify   2. Medication noncompliance due to cognitive impairment - see above - will discontinue a few medications to help promote compliance - discontinue asa, Myrbetriq, Vitamin D , ferrous sulfate  3. Psychotic depression (HCC) - see above  - followed by Dr. Tasia - unsuccessful trial Remeron  due to muscle twitching - 10/17 Zoloft started> ? Hyponatremia  4. Hyponatremia - Na + 132 10/13 - sodium tablets ordered> refusing at times    Family/ staff Communication: plan discussed with nursing,  attempted to call son, Chip   Labs/tests ordered:  stat cbc/diff, bmp, UA/culture

## 2024-02-14 DIAGNOSIS — Z9181 History of falling: Secondary | ICD-10-CM | POA: Diagnosis not present

## 2024-02-14 DIAGNOSIS — R2689 Other abnormalities of gait and mobility: Secondary | ICD-10-CM | POA: Diagnosis not present

## 2024-02-14 DIAGNOSIS — R41841 Cognitive communication deficit: Secondary | ICD-10-CM | POA: Diagnosis not present

## 2024-02-14 DIAGNOSIS — E871 Hypo-osmolality and hyponatremia: Secondary | ICD-10-CM | POA: Diagnosis not present

## 2024-02-14 DIAGNOSIS — M6281 Muscle weakness (generalized): Secondary | ICD-10-CM | POA: Diagnosis not present

## 2024-02-14 DIAGNOSIS — R8290 Unspecified abnormal findings in urine: Secondary | ICD-10-CM | POA: Diagnosis not present

## 2024-02-14 DIAGNOSIS — R42 Dizziness and giddiness: Secondary | ICD-10-CM | POA: Diagnosis not present

## 2024-02-15 DIAGNOSIS — R42 Dizziness and giddiness: Secondary | ICD-10-CM | POA: Diagnosis not present

## 2024-02-15 DIAGNOSIS — Z9181 History of falling: Secondary | ICD-10-CM | POA: Diagnosis not present

## 2024-02-15 DIAGNOSIS — E871 Hypo-osmolality and hyponatremia: Secondary | ICD-10-CM | POA: Diagnosis not present

## 2024-02-15 DIAGNOSIS — R2681 Unsteadiness on feet: Secondary | ICD-10-CM | POA: Diagnosis not present

## 2024-02-15 DIAGNOSIS — R41841 Cognitive communication deficit: Secondary | ICD-10-CM | POA: Diagnosis not present

## 2024-02-16 ENCOUNTER — Telehealth: Payer: Self-pay | Admitting: Adult Health

## 2024-02-16 DIAGNOSIS — R42 Dizziness and giddiness: Secondary | ICD-10-CM | POA: Diagnosis not present

## 2024-02-16 DIAGNOSIS — E871 Hypo-osmolality and hyponatremia: Secondary | ICD-10-CM | POA: Diagnosis not present

## 2024-02-16 DIAGNOSIS — R41841 Cognitive communication deficit: Secondary | ICD-10-CM | POA: Diagnosis not present

## 2024-02-16 MED ORDER — AMOXICILLIN 875 MG PO TABS: 875.0000 mg | ORAL_TABLET | Freq: Two times a day (BID) | ORAL | 0 refills | Status: AC

## 2024-02-16 NOTE — Telephone Encounter (Signed)
 UA  C and S resulted showing>100K of Enterococcus faecelis Sensitive to ampicillin

## 2024-02-19 LAB — BASIC METABOLIC PANEL WITH GFR
BUN: 14 (ref 4–21)
CO2: 24 — AB (ref 13–22)
Chloride: 98 — AB (ref 99–108)
Creatinine: 0.6 (ref 0.5–1.1)
Glucose: 87
Potassium: 4.8 meq/L (ref 3.5–5.1)
Sodium: 135 — AB (ref 137–147)

## 2024-02-19 LAB — COMPREHENSIVE METABOLIC PANEL WITH GFR
Calcium: 9.4 (ref 8.7–10.7)
eGFR: 87

## 2024-02-20 DIAGNOSIS — Z9181 History of falling: Secondary | ICD-10-CM | POA: Diagnosis not present

## 2024-02-20 DIAGNOSIS — R41841 Cognitive communication deficit: Secondary | ICD-10-CM | POA: Diagnosis not present

## 2024-02-20 DIAGNOSIS — R2681 Unsteadiness on feet: Secondary | ICD-10-CM | POA: Diagnosis not present

## 2024-02-21 DIAGNOSIS — R41841 Cognitive communication deficit: Secondary | ICD-10-CM | POA: Diagnosis not present

## 2024-02-21 DIAGNOSIS — R2681 Unsteadiness on feet: Secondary | ICD-10-CM | POA: Diagnosis not present

## 2024-02-21 DIAGNOSIS — Z9181 History of falling: Secondary | ICD-10-CM | POA: Diagnosis not present

## 2024-02-22 DIAGNOSIS — M6281 Muscle weakness (generalized): Secondary | ICD-10-CM | POA: Diagnosis not present

## 2024-02-22 DIAGNOSIS — R2689 Other abnormalities of gait and mobility: Secondary | ICD-10-CM | POA: Diagnosis not present

## 2024-02-22 DIAGNOSIS — Z9181 History of falling: Secondary | ICD-10-CM | POA: Diagnosis not present

## 2024-02-25 ENCOUNTER — Other Ambulatory Visit: Payer: Self-pay | Admitting: Orthopedic Surgery

## 2024-02-25 MED ORDER — MORPHINE SULFATE (CONCENTRATE) 20 MG/ML PO SOLN: 5.0000 mg | Freq: Four times a day (QID) | ORAL | 0 refills | Status: DC | PRN

## 2024-02-25 NOTE — Progress Notes (Signed)
 [facility] nursing calls to report change in patient condition. Poor po intake. Appears in pain. Hospice was discussed with son. He is agreeable for comfort measures. Morphine  concentrate orders given to nurse.

## 2024-02-26 ENCOUNTER — Non-Acute Institutional Stay (SKILLED_NURSING_FACILITY): Payer: Self-pay | Admitting: Adult Health

## 2024-02-26 ENCOUNTER — Encounter: Payer: Self-pay | Admitting: Adult Health

## 2024-02-26 DIAGNOSIS — R634 Abnormal weight loss: Secondary | ICD-10-CM | POA: Diagnosis not present

## 2024-02-26 DIAGNOSIS — F0393 Unspecified dementia, unspecified severity, with mood disturbance: Secondary | ICD-10-CM

## 2024-02-26 DIAGNOSIS — R627 Adult failure to thrive: Secondary | ICD-10-CM | POA: Diagnosis not present

## 2024-02-26 NOTE — Progress Notes (Signed)
 Bari America, ANP [facility] [phone]  Location:  [facility] Room Number: 105 P Place of Service:  SNF [phone]) Provider: Tawni America, NP    Patient Care Team: Charlanne Fredia CROME, MD as PCP - General (Internal Medicine) Lonni Slain, MD as PCP - Cardiology (Cardiology) Cary Doffing, MD as Attending Physician (Dermatology) Rosalva Sawyer, MD as Consulting Physician (Obstetrics and Gynecology) Leslee Reusing, MD as Consulting Physician (Ophthalmology) Tyree Nanetta SAILOR, RN as Oncology Nurse Navigator Curvin Deward MOULD, MD as Consulting Physician (General Surgery) Lanny Callander, MD as Consulting Physician (Hematology) Shannon Agent, MD as Consulting Physician (Radiation Oncology) Hunsucker, Donnice SAUNDERS, MD as Consulting Physician (Pulmonary Disease)  Extended Emergency Contact Information Primary Emergency Contact: Jarquin,William Chip  [place] Home Phone: [phone] Work Phone: [phone] Mobile Phone: [phone] Relation: Son Secondary Emergency Contact: Ryan,Liz Address: [address] [place], [place] [address] [place] Phone: [phone] Relation: Daughter  Code Status:  DNR Goals of care: Advanced Directive information    01/29/2024   12:20 PM  Advanced Directives  Does Patient Have a Medical Advance Directive? Yes  Type of Estate Agent of [place];Out of facility DNR (pink MOST or yellow form)  Does patient want to make changes to medical advance directive? No - Patient declined  Copy of Healthcare Power of Attorney in Chart? Yes - validated most recent copy scanned in chart (See row information)  Pre-existing out of facility DNR order (yellow form or pink MOST form) Pink MOST form placed in chart (order not valid for inpatient use)     Chief Complaint  Patient presents with   Functional Decline    HPI:  Pt is a 89 y.o. female seen  today for an acute visit for a functional decline  The nurse reports over the weekend Ms. Dobratz has not been eating and appears rigid at times. She is not able to take her pills and it seems hard for her to swallow. She also is weaker and needs a hoyer lift for transfers. The oncall provider was notified over the weekend and an order for morphine  was given for pain and consideration was made for hospice. The nurse had talked to her son Joie Hipps and he agreed to the above plan.   For my visit Ms. Ohms is not verbal and can not contribute to the history.   Background:Prior to this decline she had already been rapidly declining with confusion, hallucinations, depression, anxiety. She was started on zoloft wellbutrin  stopped due to concern that it was causing hallucination and confusion. Also on Buspar and ativan for anxiety.   Hyponatremia: on sodium tablets, last NA 135 02/19/24 Treated for a UTI with augmentin 02/16/24 >100k of enterococcus faecilis No urinary symptoms, just confusion.   06/2023 CT head noted stable mild periventricular white matter disease, likely chronic microvascular ischemia.  12/2023 CT head was stable, no acute intracranial abnormalities.   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   Diverticulosis    Esophageal reflux    Family history of breast cancer 11/25/2021   Hiatal hernia    Hx of colonic polyps    Hypertension    Per new patient packet   Mild asthma    Per new patient packet   Osteoarthritis    Osteopenia    bone density 1-09 and 2-13  Pulmonary nodules    scattered-CT Scan July 2010, 12-10, and 04-2010-all stable felt benign    Past Surgical History:  Procedure Laterality Date   BREAST LUMPECTOMY WITH RADIOACTIVE SEED LOCALIZATION Left 12/07/2021   Procedure: LEFT BREAST LUMPECTOMY WITH RADIOACTIVE SEED LOCALIZATION;  Surgeon: Curvin Deward MOULD, MD;  Location: [facility];  Service:  General;  Laterality: Left;   CHOLECYSTECTOMY  04/25/1972   Gall Bladder; [facility]   DG PORTABLE CHEST X RAY ([facility] HX)  2023   Shoulder   DIAGNOSTIC MAMMOGRAM  2022   [facility]; Per new patient packet   JOINT REPLACEMENT Left 08/24/2010   JOINT REPLACEMENT Left 04/25/2010   Reverse    ORIF PELVIC FRACTURE WITH PERCUTANEOUS SCREWS Right 10/30/2023   Procedure: CLOSED REDUCTION, PELVIS, WITH PERCUTANEOUS FIXATION;  Surgeon: Kendal Franky SQUIBB, MD;  Location: [facility] OR;  Service: Orthopedics;  Laterality: Right;   orthroscopic  Rotator Cuff Left 04/25/2009   Partial shoulder Left 04/26/2003   ROTATOR CUFF REPAIR     x 2 1998 and 1999   SPINE SURGERY  04/26/1983   disk   TONSILLECTOMY  04/25/1950   Dr. Jerome; Per new patient packet    Allergies  Allergen Reactions   Arimidex  [Anastrozole ] Other (See Comments)    Caused excessive sweating. Allergy not listed on MAR   Sulfa Antibiotics Other (See Comments)    The patient said she was told when she was 17 she was allergic to this.    Outpatient Encounter Medications as of 02/26/2024  Medication Sig   acetaminophen  (TYLENOL ) 500 MG tablet Take 500 mg by mouth every 8 (eight) hours as needed for mild pain (pain score 1-3) (or headaches).   acetaminophen  (TYLENOL ) 500 MG tablet Take 1 tablet (500 mg total) by mouth in the morning and at bedtime.   atenolol  (TENORMIN ) 25 MG tablet Take 25 mg by mouth at bedtime.   busPIRone (BUSPAR) 5 MG tablet Take 1 tablet (5 mg total) by mouth 3 (three) times daily as needed.   busPIRone (BUSPAR) 5 MG tablet Take 1 tablet (5 mg total) by mouth 2 (two) times daily.   Cyanocobalamin  (B-12) 1000 MCG TABS Take 1,000 mcg by mouth in the morning.   diclofenac Sodium (VOLTAREN) 1 % GEL Apply 1 Application topically every 6 (six) hours as needed (for pain).   docusate sodium  (COLACE) 100 MG capsule Take 1 capsule (100 mg total) by mouth 2 (two) times daily.   folic acid  (FOLVITE ) 800 MCG tablet Take 800 mcg by mouth  at bedtime.   hydrALAZINE  (APRESOLINE ) 10 MG tablet Take 1 tablet (10 mg total) by mouth as needed (for blood pressure 150 or above).   lidocaine  4 % Place 1 patch onto the skin in the morning and at bedtime.   loperamide (IMODIUM A-D) 2 MG tablet Take 4 mg by mouth every 6 (six) hours as needed for diarrhea or loose stools.   LORazepam (ATIVAN) 0.5 MG tablet Take 0.5 tablets (0.25 mg total) by mouth 2 (two) times daily as needed for anxiety (0.25/ML transdermal> apply 1mL to skin BID prn).   magnesium hydroxide (MILK OF MAGNESIA) 400 MG/5ML suspension Take 30 mLs by mouth as directed. **DAW** Give 30 ml by mouth every 24 hours as needed for constipation once daily If no BM in 24hrs, check rectum for hard stool. Remove stool digitally if present(see order for fleet enema). DO NOT USE FOR CHRONIC KIDNEY DISEASE STAGE FOUR OR HEMODIALYSIS PATIENTS   melatonin 5 MG  TABS Take 5 mg by mouth as needed (For insomnia).   methocarbamol  (ROBAXIN ) 500 MG tablet Take 1 tablet (500 mg total) by mouth every 6 (six) hours as needed for muscle spasms.   morphine  (ROXANOL) 20 MG/ML concentrated solution Take 0.25 mLs (5 mg total) by mouth every 6 (six) hours as needed for severe pain (pain score 7-10), shortness of breath, moderate pain (pain score 4-6), anxiety or breakthrough pain.   ondansetron  (ZOFRAN ) 4 MG tablet Take 4 mg by mouth every 6 (six) hours as needed for nausea or vomiting.   pantoprazole  (PROTONIX ) 40 MG tablet Take 1 tablet (40 mg total) by mouth daily.   polyethylene glycol (MIRALAX  / GLYCOLAX ) 17 g packet Take 17 g by mouth daily.   sertraline (ZOLOFT) 50 MG tablet Take 50 mg by mouth daily.   sodium chloride  1 g tablet Take 1 tablet (1 g total) by mouth 2 (two) times daily with a meal. Take 500 mg bid   SYSTANE COMPLETE PF 0.6 % SOLN Place 1 drop into both eyes 3 (three) times daily as needed (for dryness).   Cholecalciferol  (VITAMIN D3) 1000 units CAPS Take 2,000 Units by mouth in the  morning. (Patient not taking: Reported on 02/26/2024)   No facility-administered encounter medications on file as of 02/26/2024.    Review of Systems  Immunization History  Administered Date(s) Administered   Fluad  Trivalent(High Dose 65+) 01/03/2023   Influenza Split 01/10/2011, 12/27/2011, 01/09/2012, 01/26/2015, 12/17/2017, 01/12/2019, 12/19/2019, 12/19/2020   Influenza-Unspecified 12/24/2021   Moderna Covid-19 Vaccine Bivalent Booster 5yrs & up 02/05/2021, 09/27/2021, 01/27/2022   Moderna SARS-COV2 Booster Vaccination 03/10/2020, 11/24/2020, 12/11/2021   Moderna Sars-Covid-2 Vaccination 05/07/2019, 06/04/2019   PNEUMOCOCCAL CONJUGATE-20 04/01/2022   Pfizer(Comirnaty )Fall Seasonal Vaccine 12 years and older 01/03/2023, 07/25/2023   Pneumococcal Polysaccharide-23 08/02/2016, 07/21/2020, 11/10/2020   Respiratory Syncytial Virus Vaccine ,Recomb Aduvanted(Arexvy ) 04/14/2022   Tdap 06/13/2022   Zoster Recombinant(Shingrix) 09/06/2016, 11/22/2016   Pertinent  Health Maintenance Due  Topic Date Due   Influenza Vaccine  03/22/2024 (Originally 11/24/2023)   Mammogram  05/08/2024   DEXA SCAN  Completed      11/14/2022    3:28 PM 11/15/2022    2:12 PM 04/04/2023    9:21 AM 06/06/2023    3:57 PM 07/24/2023    1:11 PM  Fall Risk  Falls in the past year? 0 0 1 1 1   Was there an injury with Fall? 0 0 1 1 1   Fall Risk Category Calculator 0 0 2 2 2   Patient at Risk for Falls Due to No Fall Risks No Fall Risks History of fall(s) History of fall(s);Impaired balance/gait No Fall Risks  Fall risk Follow up Falls evaluation completed Falls evaluation completed Falls evaluation completed Falls evaluation completed Falls evaluation completed   Functional Status Survey:    Vitals:   02/26/24 1003  BP: (!) 156/80  Pulse: 94  Resp: 16  Temp: (!) 97.5 F (36.4 C)  SpO2: 95%  Weight: 95 lb (43.1 kg)  Height: 5' 2 (1.575 m)   Body mass index is 17.38 kg/m. Physical Exam Constitutional:       General: She is not in acute distress.    Appearance: She is not diaphoretic.  HENT:     Head: Normocephalic and atraumatic.     Mouth/Throat:     Comments: Refused to open her mouth Eyes:     Extraocular Movements: Extraocular movements intact.     Conjunctiva/sclera: Conjunctivae normal.     Pupils: Pupils are  equal, round, and reactive to light.     Comments: Not able to f/c but tracks in the room   Neck:     Vascular: No JVD.  Cardiovascular:     Rate and Rhythm: Regular rhythm. Tachycardia present.     Heart sounds: No murmur heard. Pulmonary:     Effort: Pulmonary effort is normal. No respiratory distress.     Breath sounds: Normal breath sounds. No wheezing.  Abdominal:     General: Bowel sounds are normal. There is no distension.     Palpations: Abdomen is soft.     Tenderness: There is no abdominal tenderness.  Musculoskeletal:     Right lower leg: No edema.     Left lower leg: No edema.  Skin:    General: Skin is warm and dry.  Neurological:     Mental Status: She is alert.     Comments: Alert, not able to f/c No obvious focal deficit Rigidity to BUE and BLE      Labs reviewed: Recent Labs    10/08/23 1646 10/09/23 0509 10/12/23 0522 10/16/23 0000 10/29/23 0526 10/31/23 0703 12/04/23 0000 01/02/24 0000 01/12/24 1441 02/19/24 0000  NA  --    < > 131*   < > 130* 132*   < > 131* 132* 135*  K  --    < > 3.8   < > 4.6 3.9   < > 4.0 4.1 4.8  CL  --    < > 98   < > 96* 100   < > 97* 96* 98*  CO2  --    < > 23   < > 25 23   < > 21 24 24*  GLUCOSE  --    < > 94   < > 101* 117*  --   --  114*  --   BUN  --    < > 11   < > 9 13   < > 13 15 14   CREATININE  --    < > 0.42*   < > 0.55 0.53   < > 0.5 0.61 0.6  CALCIUM   --    < > 8.4*   < > 8.6* 8.4*   < > 9.0 9.1 9.4  MG 1.9  --   --   --   --   --   --   --   --   --   PHOS  --   --  3.6  --   --   --   --   --   --   --    < > = values in this interval not displayed.   Recent Labs    09/26/23 0000  09/26/23 1020 10/12/23 0522 01/02/24 0000 01/12/24 1441  AST 19  --   --  15 18  ALT 13  --   --  9 5  ALKPHOS 66  --   --  58 60  BILITOT  --   --   --   --  0.4  PROT  --   --   --   --  6.4*  ALBUMIN  --    < > 3.1* 3.7 4.1   < > = values in this interval not displayed.   Recent Labs    10/27/23 0421 10/29/23 0526 10/31/23 0703 11/01/23 0605 12/04/23 0000 01/02/24 0000 01/12/24 1536  WBC 7.6 8.7 9.6 8.4 6.3 5.3 5.8  NEUTROABS 5.7 6.4  --   --   --   --  4.5  HGB 11.6* 11.1* 10.4* 10.5* 11.0* 10.8* 9.8*  HCT 35.3* 32.2* 31.3* 31.7* 32* 31* 29.2*  MCV 82.7 81.7 82.6 82.1  --   --  80.2  PLT 446* 437* 395 340 242 256 238   Lab Results  Component Value Date   TSH 0.947 10/11/2023   Lab Results  Component Value Date   HGBA1C 5.5 06/08/2022   Lab Results  Component Value Date   CHOL 136 04/13/2023   HDL 57 04/13/2023   LDLCALC 63 04/13/2023   TRIG 83 04/13/2023    Significant Diagnostic Results in last 30 days:  No results found.  Assessment/Plan  1. Senile dementia with depression (HCC) (Primary) Rapidly progressing  With weight loss Recommend hospice referral, left message with her son  DNR in place with goals of care as comfort based  2. Weight loss See above   3. Failure to thrive in adult Losing weight, functional losses with low BMI and dementia

## 2024-02-27 DIAGNOSIS — R2681 Unsteadiness on feet: Secondary | ICD-10-CM | POA: Diagnosis not present

## 2024-02-27 DIAGNOSIS — Z9181 History of falling: Secondary | ICD-10-CM | POA: Diagnosis not present

## 2024-02-27 DIAGNOSIS — R41841 Cognitive communication deficit: Secondary | ICD-10-CM | POA: Diagnosis not present

## 2024-03-25 DEATH — deceased

## 2024-03-26 ENCOUNTER — Ambulatory Visit: Payer: PPO | Admitting: Oncology
